# Patient Record
Sex: Male | Born: 1960 | Race: White | Hispanic: No | Marital: Married | Smoking: Current some day smoker
Health system: Southern US, Community
[De-identification: ages and names within clinical notes are randomized; demographics above are authoritative.]

## PROBLEM LIST (undated history)

## (undated) DIAGNOSIS — M5137 Other intervertebral disc degeneration, lumbosacral region: Secondary | ICD-10-CM

## (undated) DIAGNOSIS — G4730 Sleep apnea, unspecified: Secondary | ICD-10-CM

## (undated) DIAGNOSIS — K602 Anal fissure, unspecified: Secondary | ICD-10-CM

## (undated) DIAGNOSIS — I739 Peripheral vascular disease, unspecified: Secondary | ICD-10-CM

## (undated) DIAGNOSIS — F339 Major depressive disorder, recurrent, unspecified: Secondary | ICD-10-CM

## (undated) DIAGNOSIS — R0600 Dyspnea, unspecified: Secondary | ICD-10-CM

## (undated) DIAGNOSIS — M51379 Other intervertebral disc degeneration, lumbosacral region without mention of lumbar back pain or lower extremity pain: Secondary | ICD-10-CM

## (undated) DIAGNOSIS — K219 Gastro-esophageal reflux disease without esophagitis: Secondary | ICD-10-CM

## (undated) DIAGNOSIS — E1149 Type 2 diabetes mellitus with other diabetic neurological complication: Secondary | ICD-10-CM

## (undated) DIAGNOSIS — N186 End stage renal disease: Secondary | ICD-10-CM

## (undated) DIAGNOSIS — F341 Dysthymic disorder: Secondary | ICD-10-CM

## (undated) DIAGNOSIS — I2510 Atherosclerotic heart disease of native coronary artery without angina pectoris: Secondary | ICD-10-CM

## (undated) DIAGNOSIS — Z972 Presence of dental prosthetic device (complete) (partial): Secondary | ICD-10-CM

## (undated) DIAGNOSIS — Z860100 Personal history of colon polyps, unspecified: Secondary | ICD-10-CM

## (undated) DIAGNOSIS — I219 Acute myocardial infarction, unspecified: Secondary | ICD-10-CM

## (undated) DIAGNOSIS — I10 Essential (primary) hypertension: Secondary | ICD-10-CM

## (undated) DIAGNOSIS — J45909 Unspecified asthma, uncomplicated: Secondary | ICD-10-CM

## (undated) DIAGNOSIS — J449 Chronic obstructive pulmonary disease, unspecified: Secondary | ICD-10-CM

## (undated) DIAGNOSIS — F419 Anxiety disorder, unspecified: Secondary | ICD-10-CM

## (undated) DIAGNOSIS — K439 Ventral hernia without obstruction or gangrene: Secondary | ICD-10-CM

## (undated) DIAGNOSIS — I4891 Unspecified atrial fibrillation: Secondary | ICD-10-CM

## (undated) DIAGNOSIS — E785 Hyperlipidemia, unspecified: Secondary | ICD-10-CM

## (undated) DIAGNOSIS — Z95810 Presence of automatic (implantable) cardiac defibrillator: Secondary | ICD-10-CM

## (undated) DIAGNOSIS — E1165 Type 2 diabetes mellitus with hyperglycemia: Secondary | ICD-10-CM

## (undated) DIAGNOSIS — J189 Pneumonia, unspecified organism: Secondary | ICD-10-CM

## (undated) DIAGNOSIS — IMO0002 Reserved for concepts with insufficient information to code with codable children: Secondary | ICD-10-CM

## (undated) DIAGNOSIS — I509 Heart failure, unspecified: Secondary | ICD-10-CM

## (undated) DIAGNOSIS — Z8719 Personal history of other diseases of the digestive system: Secondary | ICD-10-CM

## (undated) DIAGNOSIS — Z992 Dependence on renal dialysis: Secondary | ICD-10-CM

## (undated) HISTORY — DX: Essential (primary) hypertension: I10

## (undated) HISTORY — PX: KNEE SURGERY: SHX244

## (undated) HISTORY — DX: Personal history of colon polyps, unspecified: Z86.0100

## (undated) HISTORY — PX: CORONARY ANGIOPLASTY: SHX604

## (undated) HISTORY — DX: Heart failure, unspecified: I50.9

## (undated) HISTORY — DX: Type 2 diabetes mellitus with other diabetic neurological complication: E11.49

## (undated) HISTORY — DX: Type 2 diabetes mellitus with other diabetic neurological complication: E11.65

## (undated) HISTORY — DX: Other intervertebral disc degeneration, lumbosacral region: M51.37

## (undated) HISTORY — PX: HERNIA REPAIR: SHX51

## (undated) HISTORY — DX: End stage renal disease: Z99.2

## (undated) HISTORY — DX: Dysthymic disorder: F34.1

## (undated) HISTORY — DX: Anal fissure, unspecified: K60.2

## (undated) HISTORY — PX: OTHER SURGICAL HISTORY: SHX169

## (undated) HISTORY — DX: Other intervertebral disc degeneration, lumbosacral region without mention of lumbar back pain or lower extremity pain: M51.379

## (undated) HISTORY — PX: CATARACT EXTRACTION W/ INTRAOCULAR LENS IMPLANT: SHX1309

## (undated) HISTORY — DX: Anxiety disorder, unspecified: F41.9

## (undated) HISTORY — DX: End stage renal disease: N18.6

## (undated) HISTORY — DX: Unspecified asthma, uncomplicated: J45.909

## (undated) HISTORY — PX: CORONARY STENT PLACEMENT: SHX1402

## (undated) HISTORY — DX: Hyperlipidemia, unspecified: E78.5

## (undated) HISTORY — DX: Major depressive disorder, recurrent, unspecified: F33.9

## (undated) HISTORY — DX: Atherosclerotic heart disease of native coronary artery without angina pectoris: I25.10

## (undated) HISTORY — DX: Sleep apnea, unspecified: G47.30

## (undated) HISTORY — DX: Reserved for concepts with insufficient information to code with codable children: IMO0002

## (undated) HISTORY — DX: Ventral hernia without obstruction or gangrene: K43.9

## (undated) HISTORY — DX: Chronic obstructive pulmonary disease, unspecified: J44.9

## (undated) HISTORY — DX: Personal history of colonic polyps: Z86.010

---

## 2003-02-06 ENCOUNTER — Encounter: Admission: RE | Admit: 2003-02-06 | Discharge: 2003-02-06 | Payer: Self-pay | Admitting: Unknown Physician Specialty

## 2010-03-28 HISTORY — PX: CARDIAC DEFIBRILLATOR PLACEMENT: SHX171

## 2010-06-09 DIAGNOSIS — I2510 Atherosclerotic heart disease of native coronary artery without angina pectoris: Secondary | ICD-10-CM

## 2013-05-01 DIAGNOSIS — F410 Panic disorder [episodic paroxysmal anxiety] without agoraphobia: Secondary | ICD-10-CM | POA: Insufficient documentation

## 2013-05-01 DIAGNOSIS — F330 Major depressive disorder, recurrent, mild: Secondary | ICD-10-CM | POA: Insufficient documentation

## 2013-12-27 HISTORY — PX: COLONOSCOPY: SHX174

## 2014-08-01 DIAGNOSIS — I70219 Atherosclerosis of native arteries of extremities with intermittent claudication, unspecified extremity: Secondary | ICD-10-CM | POA: Insufficient documentation

## 2015-03-06 DIAGNOSIS — E1142 Type 2 diabetes mellitus with diabetic polyneuropathy: Secondary | ICD-10-CM | POA: Insufficient documentation

## 2015-03-06 DIAGNOSIS — Z794 Long term (current) use of insulin: Secondary | ICD-10-CM

## 2015-03-06 DIAGNOSIS — E1165 Type 2 diabetes mellitus with hyperglycemia: Secondary | ICD-10-CM

## 2015-03-06 DIAGNOSIS — E11319 Type 2 diabetes mellitus with unspecified diabetic retinopathy without macular edema: Secondary | ICD-10-CM | POA: Insufficient documentation

## 2015-03-06 DIAGNOSIS — I10 Essential (primary) hypertension: Secondary | ICD-10-CM | POA: Insufficient documentation

## 2015-03-06 DIAGNOSIS — IMO0002 Reserved for concepts with insufficient information to code with codable children: Secondary | ICD-10-CM | POA: Insufficient documentation

## 2015-04-07 DIAGNOSIS — I2510 Atherosclerotic heart disease of native coronary artery without angina pectoris: Secondary | ICD-10-CM | POA: Insufficient documentation

## 2015-04-07 DIAGNOSIS — Z95810 Presence of automatic (implantable) cardiac defibrillator: Secondary | ICD-10-CM | POA: Insufficient documentation

## 2015-04-07 DIAGNOSIS — G4733 Obstructive sleep apnea (adult) (pediatric): Secondary | ICD-10-CM | POA: Insufficient documentation

## 2015-06-16 DIAGNOSIS — K469 Unspecified abdominal hernia without obstruction or gangrene: Secondary | ICD-10-CM | POA: Insufficient documentation

## 2015-08-26 DIAGNOSIS — J449 Chronic obstructive pulmonary disease, unspecified: Secondary | ICD-10-CM | POA: Insufficient documentation

## 2015-08-26 DIAGNOSIS — E039 Hypothyroidism, unspecified: Secondary | ICD-10-CM | POA: Insufficient documentation

## 2015-08-26 DIAGNOSIS — I739 Peripheral vascular disease, unspecified: Secondary | ICD-10-CM | POA: Insufficient documentation

## 2015-08-26 DIAGNOSIS — R809 Proteinuria, unspecified: Secondary | ICD-10-CM | POA: Insufficient documentation

## 2015-08-26 DIAGNOSIS — K219 Gastro-esophageal reflux disease without esophagitis: Secondary | ICD-10-CM | POA: Insufficient documentation

## 2015-08-26 DIAGNOSIS — M5137 Other intervertebral disc degeneration, lumbosacral region: Secondary | ICD-10-CM | POA: Insufficient documentation

## 2015-08-26 DIAGNOSIS — E782 Mixed hyperlipidemia: Secondary | ICD-10-CM | POA: Insufficient documentation

## 2015-08-26 DIAGNOSIS — M159 Polyosteoarthritis, unspecified: Secondary | ICD-10-CM | POA: Insufficient documentation

## 2015-08-26 DIAGNOSIS — F419 Anxiety disorder, unspecified: Secondary | ICD-10-CM | POA: Insufficient documentation

## 2015-08-27 DIAGNOSIS — F528 Other sexual dysfunction not due to a substance or known physiological condition: Secondary | ICD-10-CM | POA: Insufficient documentation

## 2015-10-26 DIAGNOSIS — K439 Ventral hernia without obstruction or gangrene: Secondary | ICD-10-CM | POA: Insufficient documentation

## 2015-11-25 DIAGNOSIS — I5022 Chronic systolic (congestive) heart failure: Secondary | ICD-10-CM | POA: Insufficient documentation

## 2015-11-25 DIAGNOSIS — Z720 Tobacco use: Secondary | ICD-10-CM | POA: Insufficient documentation

## 2015-12-16 DIAGNOSIS — N185 Chronic kidney disease, stage 5: Secondary | ICD-10-CM | POA: Insufficient documentation

## 2015-12-16 DIAGNOSIS — N2581 Secondary hyperparathyroidism of renal origin: Secondary | ICD-10-CM | POA: Insufficient documentation

## 2016-06-20 DIAGNOSIS — G894 Chronic pain syndrome: Secondary | ICD-10-CM | POA: Insufficient documentation

## 2016-07-07 DIAGNOSIS — R5381 Other malaise: Secondary | ICD-10-CM | POA: Insufficient documentation

## 2016-07-07 DIAGNOSIS — D649 Anemia, unspecified: Secondary | ICD-10-CM | POA: Insufficient documentation

## 2016-07-07 DIAGNOSIS — R5383 Other fatigue: Secondary | ICD-10-CM

## 2016-12-01 HISTORY — PX: ESOPHAGOGASTRODUODENOSCOPY: SHX1529

## 2017-03-16 DIAGNOSIS — I10 Essential (primary) hypertension: Secondary | ICD-10-CM | POA: Insufficient documentation

## 2017-03-16 DIAGNOSIS — N186 End stage renal disease: Secondary | ICD-10-CM | POA: Insufficient documentation

## 2017-03-16 DIAGNOSIS — E7800 Pure hypercholesterolemia, unspecified: Secondary | ICD-10-CM | POA: Insufficient documentation

## 2017-03-16 DIAGNOSIS — Z992 Dependence on renal dialysis: Secondary | ICD-10-CM

## 2017-03-16 DIAGNOSIS — I519 Heart disease, unspecified: Secondary | ICD-10-CM | POA: Insufficient documentation

## 2017-03-16 DIAGNOSIS — I7090 Unspecified atherosclerosis: Secondary | ICD-10-CM | POA: Insufficient documentation

## 2017-07-27 DIAGNOSIS — Z9181 History of falling: Secondary | ICD-10-CM | POA: Insufficient documentation

## 2017-08-16 DIAGNOSIS — T8571XA Infection and inflammatory reaction due to peritoneal dialysis catheter, initial encounter: Secondary | ICD-10-CM | POA: Insufficient documentation

## 2017-09-08 DIAGNOSIS — A0472 Enterocolitis due to Clostridium difficile, not specified as recurrent: Secondary | ICD-10-CM | POA: Insufficient documentation

## 2017-09-08 DIAGNOSIS — J9601 Acute respiratory failure with hypoxia: Secondary | ICD-10-CM | POA: Insufficient documentation

## 2017-10-16 DIAGNOSIS — I483 Typical atrial flutter: Secondary | ICD-10-CM | POA: Insufficient documentation

## 2017-10-18 ENCOUNTER — Encounter: Payer: Self-pay | Admitting: Gastroenterology

## 2017-10-30 ENCOUNTER — Encounter: Payer: Self-pay | Admitting: Gastroenterology

## 2017-11-09 ENCOUNTER — Encounter: Payer: Self-pay | Admitting: Gastroenterology

## 2017-11-09 ENCOUNTER — Ambulatory Visit (INDEPENDENT_AMBULATORY_CARE_PROVIDER_SITE_OTHER): Payer: Medicare Other | Admitting: Gastroenterology

## 2017-11-09 ENCOUNTER — Encounter (INDEPENDENT_AMBULATORY_CARE_PROVIDER_SITE_OTHER): Payer: Self-pay

## 2017-11-09 VITALS — BP 122/82 | HR 70 | Ht 72.0 in | Wt 179.0 lb

## 2017-11-09 DIAGNOSIS — A0471 Enterocolitis due to Clostridium difficile, recurrent: Secondary | ICD-10-CM | POA: Diagnosis not present

## 2017-11-09 NOTE — Patient Instructions (Signed)
If you are age 56 or older, your body mass index should be between 23-30. Your Body mass index is 24.28 kg/m. If this is out of the aforementioned range listed, please consider follow up with your Primary Care Provider.  If you are age 69 or younger, your body mass index should be between 19-25. Your Body mass index is 24.28 kg/m. If this is out of the aformentioned range listed, please consider follow up with your Primary Care Provider.     Thank you,  Dr. Jackquline Denmark

## 2017-11-09 NOTE — Progress Notes (Signed)
IMPRESSION and PLAN:    #1. H/O Recurrent C. Diff (2 episodes) -Last stool study was negative for C. difficile -Patient currently on tapering vancomycin 125 mg 4 times daily for 2 weeks, 125 mg twice daily for 1 week, 125 mg p.o. once a day for 1 week, 125 mg every 2 days for 4 weeks -Probiotics to continue. -Last colonoscopy 12/2013 (small tubular adenomas, next colonoscopy due 12/2018). -Since patient is doing very well currently and the diarrhea has completely resolved, would hold off on evaluation for fecal transplantation.  If he has recurrent diarrhea after stopping vancomycin, we would recheck stool, if positive give Dificid and reevaluate.  If fecal transplantation is needed, they would like to be referred to [facility]. -Can try to cut down Nexium to every other day.  If there is no recurrence of reflux, can consider decreasing Nexium further. -Discussed extensively with the patient and patient's wife. -Follow-up in 3 months, earlier in case of any problems. -Avoid antibiotics as much as possible.     #2.  Multiple comorbid conditions including CHF, ESRD on HD (MWF), CAD, CHF, T2DM, CVA, peripheral neuropathy.      HPI:    Chief Complaint:   Jose Sellers is a 57 y.o. male  With 2 episodes of C. difficile diarrhea After he was given antibiotics for Pseudomonas peritonitis while on peritoneal dialysis. Patient is currently on hemodialysis. His first episode was treated with 10-day course of vancomycin.  He did get better and then again started having diarrhea when he stopped vancomycin.  Thereafter he was given pulsed vancomycin therapy which she is currently taking. Doing very well currently No diarrhea No abdominal pain Denies having any fever chills. Feels significantly better No nausea vomiting heartburn regurgitation odynophagia or dysphagia.  He has been taking Nexium once a day. Has lost weight after he was diuresed.   Past Medical  History:  Diagnosis Date  . Anal fissure   . Anxiety   . Asthma   . CAD (coronary artery disease)   . CHF (congestive heart failure) ([place])   . COPD (chronic obstructive pulmonary disease) ([place])   . Degeneration of lumbar or lumbosacral intervertebral disc   . DM (diabetes mellitus), type 2, uncontrolled w/neurologic complication ([place])   . Dysthymic disorder   . End stage renal disease on dialysis ([place])   . Essential (primary) hypertension   . Hx of colonic polyps   . Hyperlipemia   . Recurrent major depression ([place])   . Sleep apnea   . Ventral hernia without obstruction or gangrene    . Current Outpatient Medications on File Prior to Visit  Medication Sig Dispense Refill  . amiodarone (PACERONE) 200 MG tablet Take 200 mg by mouth daily.    Marland Kitchen amLODipine (NORVASC) 5 MG tablet Take 5 mg by mouth daily.    Marland Kitchen atorvastatin (LIPITOR) 40 MG tablet Take 40 mg by mouth daily.    . calcitRIOL (ROCALTROL) 0.25 MCG capsule Take 0.25 mcg by mouth daily.    . carvedilol (COREG) 6.25 MG tablet Take 6.25 mg by mouth 2 (two) times daily with a meal.    . esomeprazole (NEXIUM) 40 MG capsule Take 40 mg by mouth daily at 12 noon.    Marland Kitchen FERRIC CITRATE PO Take by mouth.    Marland Kitchen FLUoxetine (PROZAC) 20 MG tablet Take 20 mg by mouth daily.    . furosemide (LASIX) 40 MG tablet Take 40 mg by mouth.    Marland Kitchen  hydrALAZINE (APRESOLINE) 25 MG tablet Take 25 mg by mouth 2 (two) times daily.    . insulin lispro (HUMALOG) 100 UNIT/ML injection Inject into the skin 3 (three) times daily before meals.    Marland Kitchen levalbuterol (XOPENEX HFA) 45 MCG/ACT inhaler Inhale 1 puff into the lungs every 4 (four) hours as needed for wheezing.    Marland Kitchen lisinopril (PRINIVIL,ZESTRIL) 10 MG tablet Take 10 mg by mouth daily.    . nitroGLYCERIN (NITROSTAT) 0.4 MG SL tablet Place 0.4 mg under the tongue every 5 (five) minutes as needed for chest pain.    Marland Kitchen oxycodone (ROXICODONE) 30 MG immediate release tablet Take 30 mg by mouth every 4 (four) hours as  needed for pain.    Marland Kitchen oxyCODONE-acetaminophen (PERCOCET) 7.5-325 MG tablet Take 1 tablet by mouth every 4 (four) hours as needed for severe pain.    . predniSONE (DELTASONE) 20 MG tablet Take 20 mg by mouth daily with breakfast.    . vancomycin (VANCOCIN) 125 MG capsule Take 125 mg by mouth as directed.     No current facility-administered medications on file prior to visit.     No current outpatient medications on file.   No current facility-administered medications for this visit.     Family History  Problem Relation Age of Onset  . Colon cancer Neg Hx     Social History   Tobacco Use  . Smoking status: Current Every Day Smoker  Substance Use Topics  . Alcohol use: Not Currently  . Drug use: Never    Allergies not on file   Review of Systems: All systems reviewed and negative except where noted in HPI.    Physical Exam:     BP 122/82   Pulse 70   Ht 6' (1.829 m)   Wt 179 lb (81.2 kg)   SpO2 96%   BMI 24.28 kg/m   GENERAL:  Alert, oriented, cooperative, not in acute distress. PSYCH: :Pleasant, normal mood and affect. HEENT:  conjunctiva pink, mucous membranes moist, neck supple without masses. No jaundice. CARDIAC:  S1 S2 normal. No murmers. PULM: Normal respiratory effort, lungs CTA bilaterally, no wheezing. ABDOMEN: Inspection: No visible peristalsis, no abnormal pulsations, skin normal.  Palpation/percussion: Soft, nontender, nondistended, no rigidity, no abnormal dullness to percussion, no hepatosplenomegaly and no palpable abdominal masses.  Auscultation: Normal bowel sounds, no abdominal bruits. Rectal exam: Deferred SKIN:  turgor, no lesions seen. Musculoskeletal:  Normal muscle tone, normal strength. NEURO: Alert and oriented x 3, no focal neurologic deficits.      Jaia Alonge,MD 11/09/2017, 11:34 AM   CC Dr Bea Graff.

## 2017-12-18 ENCOUNTER — Other Ambulatory Visit: Payer: Self-pay

## 2017-12-18 ENCOUNTER — Ambulatory Visit: Payer: Medicare Other | Admitting: Podiatry

## 2017-12-21 ENCOUNTER — Other Ambulatory Visit: Payer: Self-pay

## 2017-12-21 ENCOUNTER — Encounter: Payer: Self-pay | Admitting: Sports Medicine

## 2017-12-21 ENCOUNTER — Ambulatory Visit: Payer: Medicare Other | Admitting: Sports Medicine

## 2017-12-21 ENCOUNTER — Ambulatory Visit (INDEPENDENT_AMBULATORY_CARE_PROVIDER_SITE_OTHER): Payer: Medicare Other | Admitting: Sports Medicine

## 2017-12-21 DIAGNOSIS — N186 End stage renal disease: Secondary | ICD-10-CM

## 2017-12-21 DIAGNOSIS — I519 Heart disease, unspecified: Secondary | ICD-10-CM

## 2017-12-21 DIAGNOSIS — B351 Tinea unguium: Secondary | ICD-10-CM

## 2017-12-21 DIAGNOSIS — E1142 Type 2 diabetes mellitus with diabetic polyneuropathy: Secondary | ICD-10-CM

## 2017-12-21 DIAGNOSIS — M79674 Pain in right toe(s): Secondary | ICD-10-CM

## 2017-12-21 DIAGNOSIS — M79675 Pain in left toe(s): Secondary | ICD-10-CM | POA: Diagnosis not present

## 2017-12-21 MED ORDER — GABAPENTIN 300 MG PO CAPS: 300.0000 mg | ORAL_CAPSULE | Freq: Three times a day (TID) | ORAL | 3 refills | Status: DC

## 2017-12-21 NOTE — Progress Notes (Signed)
Subjective: Jose Sellers is a 57 y.o. male patient with history of diabetes who presents to office today complaining of long,mildly painful nails  while ambulating in shoes; unable to trim. Patient states that the glucose reading this morning was 180 mg/dl. Does not know A1c and last PCP was 1 month ago. Patient denies any new changes in medication or new problems. Patient admits numbness, burning or tingling in the legs/feet for years 10/10 pain.  Review of Systems  Musculoskeletal: Positive for myalgias.  All other systems reviewed and are negative.    Patient Active Problem List   Diagnosis Date Noted  . Typical atrial flutter ([place]) 10/16/2017  . Acute respiratory failure with hypoxia ([place]) 09/08/2017  . C. difficile diarrhea 09/08/2017  . Peritonitis associated with peritoneal dialysis ([place]) 08/16/2017  . At high risk for falls 07/27/2017  . Arteriosclerotic vascular disease 03/16/2017  . Chronic heart disease 03/16/2017  . End-stage renal disease ([place]) 03/16/2017  . Hypercholesterolemia 03/16/2017  . Hypertensive disorder 03/16/2017  . Anemia of unknown etiology 07/07/2016  . Malaise and fatigue 07/07/2016  . Chronic pain syndrome 06/20/2016  . Secondary hyperparathyroidism ([place]) 12/16/2015  . Stage 5 chronic kidney disease ([place]) 12/16/2015  . Chronic systolic congestive heart failure ([place]) 11/25/2015  . Tobacco use 11/25/2015  . Ventral hernia without obstruction or gangrene 10/26/2015  . Psychosexual dysfunction with inhibited sexual excitement 08/27/2015  . Anxiety 08/26/2015  . COPD (chronic obstructive pulmonary disease) ([place]) 08/26/2015  . DDD (degenerative disc disease), lumbosacral 08/26/2015  . GERD (gastroesophageal reflux disease) 08/26/2015  . Hypothyroidism 08/26/2015  . Microalbuminuria 08/26/2015  . Mixed hyperlipidemia 08/26/2015  . Osteoarthritis of multiple joints 08/26/2015  . Peripheral vascular disease ([place]) 08/26/2015  . Abdominal hernia 06/16/2015   . CAD (coronary artery disease) 04/07/2015  . 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 OSA (obstructive sleep apnea) 04/07/2015  . Essential hypertension 03/06/2015  . Retinopathy, diabetic, bilateral ([place]) 03/06/2015  . Uncontrolled type 2 diabetes mellitus with diabetic polyneuropathy, with long-term current use of insulin ([place]) 03/06/2015  . Atherosclerosis with claudication of extremity ([place]) 08/01/2014  . Mild episode of recurrent major depressive disorder ([place]) 05/01/2013  . Panic disorder 05/01/2013   Current Outpatient Medications on File Prior to Visit  Medication Sig Dispense Refill  . albuterol (PROAIR HFA) 108 (90 Base) MCG/ACT inhaler ProAir HFA 90 mcg/actuation aerosol inhaler    . amiodarone (PACERONE) 200 MG tablet Take 200 mg by mouth daily.    Marland Kitchen amLODipine (NORVASC) 5 MG tablet Take 5 mg by mouth daily.    Marland Kitchen apixaban (ELIQUIS) 2.5 MG TABS tablet Take by mouth.    Marland Kitchen aspirin 81 MG tablet Take by mouth.    Marland Kitchen atorvastatin (LIPITOR) 40 MG tablet Take 40 mg by mouth daily.    . calcitRIOL (ROCALTROL) 0.25 MCG capsule Take 0.25 mcg by mouth daily.    . carvedilol (COREG) 6.25 MG tablet Take 6.25 mg by mouth 2 (two) times daily with a meal.    . cephALEXin (KEFLEX) 500 MG capsule cephalexin 500 mg capsule    . cloNIDine (CATAPRES) 0.1 MG tablet clonidine HCl 0.1 mg tablet    . doxycycline (VIBRA-TABS) 100 MG tablet doxycycline hyclate 100 mg tablet    . ELIQUIS 2.5 MG TABS tablet TAKE ONE TABLET BY MOUTH 2 TIMES DAILY.  3  . esomeprazole (NEXIUM) 40 MG capsule Take 40 mg by mouth daily at 12 noon.    Marland Kitchen esomeprazole (NEXIUM) 40 MG capsule esomeprazole magnesium  40 mg capsule,delayed release    . fenofibrate (TRICOR) 145 MG tablet fenofibrate nanocrystallized 145 mg tablet    . FERRIC CITRATE PO Take by mouth.    . FIRVANQ 50 MG/ML SOLR   0  . FLOVENT HFA 44 MCG/ACT inhaler INL 2 PFS PO BID  3  . FLUoxetine (PROZAC) 20 MG capsule fluoxetine 20 mg  capsule    . FLUoxetine (PROZAC) 20 MG tablet Take 20 mg by mouth daily.    . fluticasone furoate-vilanterol (BREO ELLIPTA) 100-25 MCG/INH AEPB Breo Ellipta 100 mcg-25 mcg/dose powder for inhalation    . Fluticasone Propionate, Inhal, (FLOVENT DISKUS) 100 MCG/BLIST AEPB Flovent Diskus 100 mcg/actuation powder for inhalation    . furosemide (LASIX) 40 MG tablet Take 40 mg by mouth.    . hydrALAZINE (APRESOLINE) 25 MG tablet Take 25 mg by mouth 2 (two) times daily.    . Insulin Glargine (LANTUS SOLOSTAR) 100 UNIT/ML Solostar Pen Lantus Solostar U-100 Insulin 100 unit/mL (3 mL) subcutaneous pen    . insulin glargine (LANTUS) 100 UNIT/ML injection Inject into the skin.    Marland Kitchen insulin lispro (HUMALOG) 100 UNIT/ML injection Inject into the skin 3 (three) times daily before meals.    Marland Kitchen levalbuterol (XOPENEX HFA) 45 MCG/ACT inhaler Inhale 1 puff into the lungs every 4 (four) hours as needed for wheezing.    . levalbuterol (XOPENEX) 0.63 MG/3ML nebulizer solution TK 1 INHALATION IN NEBULIZER QID  1  . levalbuterol (XOPENEX) 0.63 MG/3ML nebulizer solution Inhale 1 vial every 4 hours    . lisinopril (PRINIVIL,ZESTRIL) 10 MG tablet Take 10 mg by mouth daily.    . metoprolol tartrate (LOPRESSOR) 50 MG tablet metoprolol tartrate 50 mg tablet    . Neomycin-Bacitracin Zn-Polymyx 3.5-400-10000 OINT neomycin 3.5 mg/g-polymyxin B 10,000 unit/g-dexameth 0.1 % eye oint    . nitroGLYCERIN (NITROSTAT) 0.4 MG SL tablet Place 0.4 mg under the tongue every 5 (five) minutes as needed for chest pain.    Marland Kitchen ondansetron (ZOFRAN-ODT) 4 MG disintegrating tablet ondansetron 4 mg disintegrating tablet    . oxycodone (ROXICODONE) 30 MG immediate release tablet Take 30 mg by mouth every 4 (four) hours as needed for pain.    Marland Kitchen oxyCODONE-acetaminophen (PERCOCET) 7.5-325 MG tablet Take 1 tablet by mouth every 4 (four) hours as needed for severe pain.    . predniSONE (DELTASONE) 20 MG tablet Take 20 mg by mouth daily with breakfast.    .  vancomycin (VANCOCIN) 125 MG capsule Take 125 mg by mouth as directed.     No current facility-administered medications on file prior to visit.    Allergies  Allergen Reactions  . Fluticasone Furoate-Vilanterol Palpitations  . Morphine Rash    No results found for this or any previous visit (from the past 2160 hour(s)).  Objective: General: Patient is awake, alert, and oriented x 3 and in no acute distress.  Integument: Skin is warm, dry and supple bilateral. Nails are tender, long, thickened and  dystrophic with subungual debris, consistent with onychomycosis, 1-5 bilateral. No signs of infection. No open lesions or preulcerative lesions present bilateral. Remaining integument unremarkable.  Vasculature:  Dorsalis Pedis pulse 2/4 bilateral. Posterior Tibial pulse  1/4 bilateral.  Capillary fill time <5 sec 1-5 bilateral. Positive hair growth to the level of the digits. Temperature gradient within normal limits. No varicosities present bilateral. No edema present bilateral.   Neurology: The patient has diminished sensation measured with a 5.07/10g Semmes Weinstein Monofilament at all pedal sites bilateral . Vibratory sensation  diminished bilateral with tuning fork. No Babinski sign present bilateral.   Musculoskeletal: No symptomatic pedal deformities noted bilateral. Muscular strength 5/5 in all lower extremity muscular groups bilateral without pain on range of motion . No tenderness with calf compression bilateral.  Assessment and Plan: Problem List Items Addressed This Visit      Cardiovascular and Mediastinum   Chronic heart disease     Genitourinary   End-stage renal disease ([place])    Other Visit Diagnoses    Pain due to onychomycosis of toenails of both feet    -  Primary   Diabetic polyneuropathy associated with type 2 diabetes mellitus ([facility])       Relevant Medications   gabapentin (NEURONTIN) 300 MG capsule     -Examined patient. -Discussed and educated patient on  diabetic foot care, especially with  regards to the vascular, neurological and musculoskeletal systems.  -Stressed the importance of good glycemic control and the detriment of not  controlling glucose levels in relation to the foot. -Mechanically debrided all nails 1-5 bilateral using sterile nail nipper and filed with dremel without incident  -Rx Gabapentin 300 TID  -Patient to return  in 3 months for at risk foot care and med check -Patient advised to call the office if any problems or questions arise in the meantime.  Landis Martins, DPM

## 2017-12-21 NOTE — Patient Instructions (Signed)

## 2017-12-28 ENCOUNTER — Other Ambulatory Visit: Payer: Self-pay

## 2017-12-28 ENCOUNTER — Ambulatory Visit: Payer: Medicare Other | Admitting: Sports Medicine

## 2017-12-28 DIAGNOSIS — N186 End stage renal disease: Secondary | ICD-10-CM

## 2018-01-16 ENCOUNTER — Other Ambulatory Visit: Payer: Self-pay

## 2018-01-16 DIAGNOSIS — I739 Peripheral vascular disease, unspecified: Secondary | ICD-10-CM

## 2018-02-08 ENCOUNTER — Encounter: Payer: Self-pay | Admitting: *Deleted

## 2018-02-08 ENCOUNTER — Other Ambulatory Visit: Payer: Self-pay | Admitting: *Deleted

## 2018-02-08 ENCOUNTER — Other Ambulatory Visit: Payer: Self-pay

## 2018-02-08 ENCOUNTER — Encounter: Payer: Self-pay | Admitting: Vascular Surgery

## 2018-02-08 ENCOUNTER — Ambulatory Visit (HOSPITAL_COMMUNITY)
Admission: RE | Admit: 2018-02-08 | Discharge: 2018-02-08 | Disposition: A | Discharge status: Home / Self Care | Payer: Medicare Other | Source: Ambulatory Visit | Attending: Vascular Surgery | Admitting: Vascular Surgery

## 2018-02-08 ENCOUNTER — Ambulatory Visit (INDEPENDENT_AMBULATORY_CARE_PROVIDER_SITE_OTHER)
Admission: RE | Admit: 2018-02-08 | Discharge: 2018-02-08 | Disposition: A | Discharge status: Home / Self Care | Payer: Medicare Other | Source: Ambulatory Visit | Attending: Vascular Surgery | Admitting: Vascular Surgery

## 2018-02-08 ENCOUNTER — Ambulatory Visit (INDEPENDENT_AMBULATORY_CARE_PROVIDER_SITE_OTHER): Payer: Medicare Other | Admitting: Vascular Surgery

## 2018-02-08 VITALS — BP 150/81 | HR 75 | Temp 97.0°F | Resp 16 | Ht 71.5 in | Wt 184.5 lb

## 2018-02-08 DIAGNOSIS — N186 End stage renal disease: Secondary | ICD-10-CM | POA: Insufficient documentation

## 2018-02-08 DIAGNOSIS — Z992 Dependence on renal dialysis: Secondary | ICD-10-CM | POA: Diagnosis not present

## 2018-02-08 NOTE — H&P (View-Only) (Signed)
Referring Physician: Dr Justin Mend  Patient name: Jose Sellers MRN: [id number] DOB: [birth year] Sex: male  REASON FOR CONSULT: Hemodialysis access  HPI: Jose Sellers is a 57 y.o. male, referred by Dr. Justin Mend for placement of a long-term hemodialysis access.  The patient was on peritoneal dialysis for a couple of years and recently had his PD catheter removed for infection.  He has now been on hemodialysis since around March 2019.  He has been using a right side dialysis catheter.  Of note he has a left side defibrillator.  Currently dialyzes Monday Wednesday Friday in [place].  He is right-handed.  He is on Eliquis for atrial fibrillation.  He denies any numbness or tingling in his hands.  Other medical problems include congestive heart failure, diabetes, COPD (requiring nighttime home oxygen), coronary artery disease.  These are all currently stable.  Of note he has been having numbness tingling and aching in his legs and is scheduled to see me in 3 weeks regarding peripheral arterial disease.  Past Medical History:  Diagnosis Date  . Anal fissure   . Anxiety   . Asthma   . CAD (coronary artery disease)   . CHF (congestive heart failure) ([place])   . COPD (chronic obstructive pulmonary disease) ([place])   . Degeneration of lumbar or lumbosacral intervertebral disc   . DM (diabetes mellitus), type 2, uncontrolled w/neurologic complication ([place])   . Dysthymic disorder   . End stage renal disease on dialysis ([place])   . Essential (primary) hypertension   . Hx of colonic polyps   . Hyperlipemia   . Recurrent major depression ([place])   . Sleep apnea   . Ventral hernia without obstruction or gangrene    Past Surgical History:  Procedure Laterality Date  . CAD s/p cabbage    . COLONOSCOPY  12/27/2013   Colonic polyps status post polypectomy.  Mild sigmoid diverticulosis. Internal hemorrhoids. Tubular adenoma.   . CORONARY STENT PLACEMENT    . ESOPHAGOGASTRODUODENOSCOPY  12/01/2016   Mild  gastritis.   . heart bypass    . HERNIA REPAIR    . KNEE SURGERY      Family History  Problem Relation Age of Onset  . Colon cancer Neg Hx     SOCIAL HISTORY: Social History   Socioeconomic History  . Marital status: Married    Spouse name: Not on file  . Number of children: Not on file  . Years of education: Not on file  . Highest education level: Not on file  Occupational History  . Not on file  Social Needs  . Financial resource strain: Not on file  . Food insecurity:    Worry: Not on file    Inability: Not on file  . Transportation needs:    Medical: Not on file    Non-medical: Not on file  Tobacco Use  . Smoking status: Current Every Day Smoker  . Smokeless tobacco: Never Used  Substance and Sexual Activity  . Alcohol use: Not Currently  . Drug use: Never  . Sexual activity: Not on file  Lifestyle  . Physical activity:    Days per week: Not on file    Minutes per session: Not on file  . Stress: Not on file  Relationships  . Social connections:    Talks on phone: Not on file    Gets together: Not on file    Attends religious service: Not on file    Active member of club or organization:  Not on file    Attends meetings of clubs or organizations: Not on file    Relationship status: Not on file  . Intimate partner violence:    Fear of current or ex partner: Not on file    Emotionally abused: Not on file    Physically abused: Not on file    Forced sexual activity: Not on file  Other Topics Concern  . Not on file  Social History Narrative  . Not on file    Allergies  Allergen Reactions  . Fluticasone Furoate-Vilanterol Palpitations  . Morphine Rash    Current Outpatient Medications  Medication Sig Dispense Refill  . albuterol (PROAIR HFA) 108 (90 Base) MCG/ACT inhaler ProAir HFA 90 mcg/actuation aerosol inhaler    . amiodarone (PACERONE) 200 MG tablet Take 200 mg by mouth daily.    Marland Kitchen amiodarone (PACERONE) 200 MG tablet Take by mouth.    Marland Kitchen  amLODipine (NORVASC) 5 MG tablet Take 5 mg by mouth daily.    Marland Kitchen apixaban (ELIQUIS) 2.5 MG TABS tablet Take by mouth.    Marland Kitchen aspirin 81 MG tablet Take by mouth.    Marland Kitchen atorvastatin (LIPITOR) 40 MG tablet Take 40 mg by mouth daily.    . calcitRIOL (ROCALTROL) 0.25 MCG capsule Take 0.25 mcg by mouth daily.    . carvedilol (COREG) 6.25 MG tablet Take 6.25 mg by mouth 2 (two) times daily with a meal.    . cephALEXin (KEFLEX) 500 MG capsule cephalexin 500 mg capsule    . cloNIDine (CATAPRES) 0.1 MG tablet clonidine HCl 0.1 mg tablet    . doxycycline (VIBRA-TABS) 100 MG tablet doxycycline hyclate 100 mg tablet    . ELIQUIS 2.5 MG TABS tablet TAKE ONE TABLET BY MOUTH 2 TIMES DAILY.  3  . esomeprazole (NEXIUM) 40 MG capsule Take 40 mg by mouth daily at 12 noon.    Marland Kitchen esomeprazole (NEXIUM) 40 MG capsule esomeprazole magnesium 40 mg capsule,delayed release    . fenofibrate (TRICOR) 145 MG tablet fenofibrate nanocrystallized 145 mg tablet    . FERRIC CITRATE PO Take by mouth.    . FIRVANQ 50 MG/ML SOLR   0  . FLOVENT HFA 44 MCG/ACT inhaler INL 2 PFS PO BID  3  . FLUoxetine (PROZAC) 20 MG capsule fluoxetine 20 mg capsule    . FLUoxetine (PROZAC) 20 MG tablet Take 20 mg by mouth daily.    . fluticasone furoate-vilanterol (BREO ELLIPTA) 100-25 MCG/INH AEPB Breo Ellipta 100 mcg-25 mcg/dose powder for inhalation    . Fluticasone Propionate, Inhal, (FLOVENT DISKUS) 100 MCG/BLIST AEPB Flovent Diskus 100 mcg/actuation powder for inhalation    . furosemide (LASIX) 40 MG tablet Take 40 mg by mouth.    . gabapentin (NEURONTIN) 300 MG capsule Take 1 capsule (300 mg total) by mouth 3 (three) times daily. 90 capsule 3  . hydrALAZINE (APRESOLINE) 25 MG tablet Take 25 mg by mouth 2 (two) times daily.    . Insulin Glargine (LANTUS SOLOSTAR) 100 UNIT/ML Solostar Pen Lantus Solostar U-100 Insulin 100 unit/mL (3 mL) subcutaneous pen    . insulin glargine (LANTUS) 100 UNIT/ML injection Inject into the skin.    Marland Kitchen insulin lispro  (HUMALOG) 100 UNIT/ML injection Inject into the skin 3 (three) times daily before meals.    Marland Kitchen levalbuterol (XOPENEX HFA) 45 MCG/ACT inhaler Inhale 1 puff into the lungs every 4 (four) hours as needed for wheezing.    . levalbuterol (XOPENEX) 0.63 MG/3ML nebulizer solution TK 1 INHALATION IN NEBULIZER QID  1  . levalbuterol (XOPENEX) 0.63 MG/3ML nebulizer solution Inhale 1 vial every 4 hours    . lisinopril (PRINIVIL,ZESTRIL) 10 MG tablet Take 10 mg by mouth daily.    . metoprolol tartrate (LOPRESSOR) 50 MG tablet metoprolol tartrate 50 mg tablet    . Neomycin-Bacitracin Zn-Polymyx 3.5-400-10000 OINT neomycin 3.5 mg/g-polymyxin B 10,000 unit/g-dexameth 0.1 % eye oint    . nitroGLYCERIN (NITROSTAT) 0.4 MG SL tablet Place 0.4 mg under the tongue every 5 (five) minutes as needed for chest pain.    Marland Kitchen ondansetron (ZOFRAN-ODT) 4 MG disintegrating tablet ondansetron 4 mg disintegrating tablet    . oxycodone (ROXICODONE) 30 MG immediate release tablet Take 30 mg by mouth every 4 (four) hours as needed for pain.    Marland Kitchen oxyCODONE-acetaminophen (PERCOCET) 7.5-325 MG tablet Take 1 tablet by mouth every 4 (four) hours as needed for severe pain.    . predniSONE (DELTASONE) 20 MG tablet Take 20 mg by mouth daily with breakfast.    . vancomycin (VANCOCIN) 125 MG capsule Take 125 mg by mouth as directed.     No current facility-administered medications for this visit.     ROS:   General:  No weight loss, Fever, chills  HEENT: No recent headaches, no nasal bleeding, no visual changes, no sore throat  Neurologic: No dizziness, blackouts, seizures. No recent symptoms of stroke or mini- stroke. No recent episodes of slurred speech, or temporary blindness.  Cardiac: No recent episodes of chest pain/pressure, no shortness of breath at rest.  + shortness of breath with exertion.  Denies history of atrial fibrillation or irregular heartbeat  Vascular: No history of rest pain in feet.  No history of claudication.  No  history of non-healing ulcer, No history of DVT   Pulmonary: + home oxygen, + productive cough, no hemoptysis,  No asthma or wheezing  Musculoskeletal:  [ ]  Arthritis, [ ]  Low back pain,  [ ]  Joint pain  Hematologic:No history of hypercoagulable state.  No history of easy bleeding.  No history of anemia  Gastrointestinal: No hematochezia or melena,  No gastroesophageal reflux, no trouble swallowing  Urinary: [X]  chronic Kidney disease, [X]  on HD - [X]  MWF or [ ]  TTHS, [ ]  Burning with urination, [ ]  Frequent urination, [ ]  Difficulty urinating;   Skin: No rashes  Psychological: No history of anxiety,  No history of depression   Physical Examination  Vitals:   02/08/18 0908  BP: (!) 150/81  Pulse: 75  Resp: 16  Temp: (!) 97 F (36.1 C)  TempSrc: Oral  SpO2: 100%  Weight: 184 lb 8 oz (83.7 kg)  Height: 5' 11.5" (1.816 m)    Body mass index is 25.37 kg/m.  General:  Alert and oriented, no acute distress HEENT: Normal Neck: No bruit or JVD Pulmonary: Clear to auscultation bilaterally Cardiac: Regular Rate and Rhythm without murmur Abdomen: Soft, non-tender, non-distended Skin: No rash Extremity Pulses:  2+ radial, brachial pulses bilaterally Musculoskeletal: No deformity or edema  Neurologic: Upper and lower extremity motor 5/5 and symmetric  DATA:  Patient had an upper extremity arterial duplex today as well as vein mapping ultrasound.  I reviewed and interpreted the study.  This showed a good quality cephalic vein on the right side 2-1/2 to 3 mm at the wrist progressing to 3 to 4 mm in the upper arm.  Left cephalic vein was fairly small less than 3 mm throughout its entire course, left and right basilic veins were of good quality both greater  than 4 mm in diameter in the upper arm.  His arterial duplex scan showed essentially normal brachial artery configuration with ulnar artery occlusion bilaterally triphasic flow in the right brachial and radial.  Radial artery  diameter at the wrist on the right side was 0.28 cm.  Ocular artery was 6 mm diameter.  Diffuse plaque throughout the artery bilaterally.  ASSESSMENT: Patient needs long-term hemodialysis access.  Risk benefits possible complications of procedure details were discussed the patient today including not limited to bleeding infection ischemic steal need for other procedures.  He wishes to proceed.   PLAN: We will start with his right arm since he has a defibrillator on the left side.  Plan will be for right radiocephalic versus brachiocephalic fistula scheduled for Tuesday, February 13, 2018.  We will stop his Eliquis perioperatively.   Ruta Hinds, MD [facility] Office: [phone] Pager: [phone]

## 2018-02-08 NOTE — Progress Notes (Signed)
Medication hold clearance faxed to [facility].

## 2018-02-08 NOTE — Progress Notes (Signed)
Referring Physician: Dr Justin Mend  Patient name: Jose Sellers MRN: [id number] DOB: November 11, 1960 Sex: male  REASON FOR CONSULT: Hemodialysis access  HPI: Jose Sellers is a 57 y.o. male, referred by Dr. Justin Mend for placement of a long-term hemodialysis access.  The patient was on peritoneal dialysis for a couple of years and recently had his PD catheter removed for infection.  He has now been on hemodialysis since around March 2019.  He has been using a right side dialysis catheter.  Of note he has a left side defibrillator.  Currently dialyzes Monday Wednesday Friday in [place].  He is right-handed.  He is on Eliquis for atrial fibrillation.  He denies any numbness or tingling in his hands.  Other medical problems include congestive heart failure, diabetes, COPD (requiring nighttime home oxygen), coronary artery disease.  These are all currently stable.  Of note he has been having numbness tingling and aching in his legs and is scheduled to see me in 3 weeks regarding peripheral arterial disease.  Past Medical History:  Diagnosis Date  . Anal fissure   . Anxiety   . Asthma   . CAD (coronary artery disease)   . CHF (congestive heart failure) ([place])   . COPD (chronic obstructive pulmonary disease) ([place])   . Degeneration of lumbar or lumbosacral intervertebral disc   . DM (diabetes mellitus), type 2, uncontrolled w/neurologic complication ([place])   . Dysthymic disorder   . End stage renal disease on dialysis ([place])   . Essential (primary) hypertension   . Hx of colonic polyps   . Hyperlipemia   . Recurrent major depression ([place])   . Sleep apnea   . Ventral hernia without obstruction or gangrene    Past Surgical History:  Procedure Laterality Date  . CAD s/p cabbage    . COLONOSCOPY  12/27/2013   Colonic polyps status post polypectomy.  Mild sigmoid diverticulosis. Internal hemorrhoids. Tubular adenoma.   . CORONARY STENT PLACEMENT    . ESOPHAGOGASTRODUODENOSCOPY  12/01/2016   Mild  gastritis.   . heart bypass    . HERNIA REPAIR    . KNEE SURGERY      Family History  Problem Relation Age of Onset  . Colon cancer Neg Hx     SOCIAL HISTORY: Social History   Socioeconomic History  . Marital status: Married    Spouse name: Not on file  . Number of children: Not on file  . Years of education: Not on file  . Highest education level: Not on file  Occupational History  . Not on file  Social Needs  . Financial resource strain: Not on file  . Food insecurity:    Worry: Not on file    Inability: Not on file  . Transportation needs:    Medical: Not on file    Non-medical: Not on file  Tobacco Use  . Smoking status: Current Every Day Smoker  . Smokeless tobacco: Never Used  Substance and Sexual Activity  . Alcohol use: Not Currently  . Drug use: Never  . Sexual activity: Not on file  Lifestyle  . Physical activity:    Days per week: Not on file    Minutes per session: Not on file  . Stress: Not on file  Relationships  . Social connections:    Talks on phone: Not on file    Gets together: Not on file    Attends religious service: Not on file    Active member of club or organization:  Not on file    Attends meetings of clubs or organizations: Not on file    Relationship status: Not on file  . Intimate partner violence:    Fear of current or ex partner: Not on file    Emotionally abused: Not on file    Physically abused: Not on file    Forced sexual activity: Not on file  Other Topics Concern  . Not on file  Social History Narrative  . Not on file    Allergies  Allergen Reactions  . Fluticasone Furoate-Vilanterol Palpitations  . Morphine Rash    Current Outpatient Medications  Medication Sig Dispense Refill  . albuterol (PROAIR HFA) 108 (90 Base) MCG/ACT inhaler ProAir HFA 90 mcg/actuation aerosol inhaler    . amiodarone (PACERONE) 200 MG tablet Take 200 mg by mouth daily.    Marland Kitchen amiodarone (PACERONE) 200 MG tablet Take by mouth.    Marland Kitchen  amLODipine (NORVASC) 5 MG tablet Take 5 mg by mouth daily.    Marland Kitchen apixaban (ELIQUIS) 2.5 MG TABS tablet Take by mouth.    Marland Kitchen aspirin 81 MG tablet Take by mouth.    Marland Kitchen atorvastatin (LIPITOR) 40 MG tablet Take 40 mg by mouth daily.    . calcitRIOL (ROCALTROL) 0.25 MCG capsule Take 0.25 mcg by mouth daily.    . carvedilol (COREG) 6.25 MG tablet Take 6.25 mg by mouth 2 (two) times daily with a meal.    . cephALEXin (KEFLEX) 500 MG capsule cephalexin 500 mg capsule    . cloNIDine (CATAPRES) 0.1 MG tablet clonidine HCl 0.1 mg tablet    . doxycycline (VIBRA-TABS) 100 MG tablet doxycycline hyclate 100 mg tablet    . ELIQUIS 2.5 MG TABS tablet TAKE ONE TABLET BY MOUTH 2 TIMES DAILY.  3  . esomeprazole (NEXIUM) 40 MG capsule Take 40 mg by mouth daily at 12 noon.    Marland Kitchen esomeprazole (NEXIUM) 40 MG capsule esomeprazole magnesium 40 mg capsule,delayed release    . fenofibrate (TRICOR) 145 MG tablet fenofibrate nanocrystallized 145 mg tablet    . FERRIC CITRATE PO Take by mouth.    . FIRVANQ 50 MG/ML SOLR   0  . FLOVENT HFA 44 MCG/ACT inhaler INL 2 PFS PO BID  3  . FLUoxetine (PROZAC) 20 MG capsule fluoxetine 20 mg capsule    . FLUoxetine (PROZAC) 20 MG tablet Take 20 mg by mouth daily.    . fluticasone furoate-vilanterol (BREO ELLIPTA) 100-25 MCG/INH AEPB Breo Ellipta 100 mcg-25 mcg/dose powder for inhalation    . Fluticasone Propionate, Inhal, (FLOVENT DISKUS) 100 MCG/BLIST AEPB Flovent Diskus 100 mcg/actuation powder for inhalation    . furosemide (LASIX) 40 MG tablet Take 40 mg by mouth.    . gabapentin (NEURONTIN) 300 MG capsule Take 1 capsule (300 mg total) by mouth 3 (three) times daily. 90 capsule 3  . hydrALAZINE (APRESOLINE) 25 MG tablet Take 25 mg by mouth 2 (two) times daily.    . Insulin Glargine (LANTUS SOLOSTAR) 100 UNIT/ML Solostar Pen Lantus Solostar U-100 Insulin 100 unit/mL (3 mL) subcutaneous pen    . insulin glargine (LANTUS) 100 UNIT/ML injection Inject into the skin.    Marland Kitchen insulin lispro  (HUMALOG) 100 UNIT/ML injection Inject into the skin 3 (three) times daily before meals.    Marland Kitchen levalbuterol (XOPENEX HFA) 45 MCG/ACT inhaler Inhale 1 puff into the lungs every 4 (four) hours as needed for wheezing.    . levalbuterol (XOPENEX) 0.63 MG/3ML nebulizer solution TK 1 INHALATION IN NEBULIZER QID  1  . levalbuterol (XOPENEX) 0.63 MG/3ML nebulizer solution Inhale 1 vial every 4 hours    . lisinopril (PRINIVIL,ZESTRIL) 10 MG tablet Take 10 mg by mouth daily.    . metoprolol tartrate (LOPRESSOR) 50 MG tablet metoprolol tartrate 50 mg tablet    . Neomycin-Bacitracin Zn-Polymyx 3.5-400-10000 OINT neomycin 3.5 mg/g-polymyxin B 10,000 unit/g-dexameth 0.1 % eye oint    . nitroGLYCERIN (NITROSTAT) 0.4 MG SL tablet Place 0.4 mg under the tongue every 5 (five) minutes as needed for chest pain.    Marland Kitchen ondansetron (ZOFRAN-ODT) 4 MG disintegrating tablet ondansetron 4 mg disintegrating tablet    . oxycodone (ROXICODONE) 30 MG immediate release tablet Take 30 mg by mouth every 4 (four) hours as needed for pain.    Marland Kitchen oxyCODONE-acetaminophen (PERCOCET) 7.5-325 MG tablet Take 1 tablet by mouth every 4 (four) hours as needed for severe pain.    . predniSONE (DELTASONE) 20 MG tablet Take 20 mg by mouth daily with breakfast.    . vancomycin (VANCOCIN) 125 MG capsule Take 125 mg by mouth as directed.     No current facility-administered medications for this visit.     ROS:   General:  No weight loss, Fever, chills  HEENT: No recent headaches, no nasal bleeding, no visual changes, no sore throat  Neurologic: No dizziness, blackouts, seizures. No recent symptoms of stroke or mini- stroke. No recent episodes of slurred speech, or temporary blindness.  Cardiac: No recent episodes of chest pain/pressure, no shortness of breath at rest.  + shortness of breath with exertion.  Denies history of atrial fibrillation or irregular heartbeat  Vascular: No history of rest pain in feet.  No history of claudication.  No  history of non-healing ulcer, No history of DVT   Pulmonary: + home oxygen, + productive cough, no hemoptysis,  No asthma or wheezing  Musculoskeletal:  [ ]  Arthritis, [ ]  Low back pain,  [ ]  Joint pain  Hematologic:No history of hypercoagulable state.  No history of easy bleeding.  No history of anemia  Gastrointestinal: No hematochezia or melena,  No gastroesophageal reflux, no trouble swallowing  Urinary: [X]  chronic Kidney disease, [X]  on HD - [X]  MWF or [ ]  TTHS, [ ]  Burning with urination, [ ]  Frequent urination, [ ]  Difficulty urinating;   Skin: No rashes  Psychological: No history of anxiety,  No history of depression   Physical Examination  Vitals:   02/08/18 0908  BP: (!) 150/81  Pulse: 75  Resp: 16  Temp: (!) 97 F (36.1 C)  TempSrc: Oral  SpO2: 100%  Weight: 184 lb 8 oz (83.7 kg)  Height: 5' 11.5" (1.816 m)    Body mass index is 25.37 kg/m.  General:  Alert and oriented, no acute distress HEENT: Normal Neck: No bruit or JVD Pulmonary: Clear to auscultation bilaterally Cardiac: Regular Rate and Rhythm without murmur Abdomen: Soft, non-tender, non-distended Skin: No rash Extremity Pulses:  2+ radial, brachial pulses bilaterally Musculoskeletal: No deformity or edema  Neurologic: Upper and lower extremity motor 5/5 and symmetric  DATA:  Patient had an upper extremity arterial duplex today as well as vein mapping ultrasound.  I reviewed and interpreted the study.  This showed a good quality cephalic vein on the right side 2-1/2 to 3 mm at the wrist progressing to 3 to 4 mm in the upper arm.  Left cephalic vein was fairly small less than 3 mm throughout its entire course, left and right basilic veins were of good quality both greater  than 4 mm in diameter in the upper arm.  His arterial duplex scan showed essentially normal brachial artery configuration with ulnar artery occlusion bilaterally triphasic flow in the right brachial and radial.  Radial artery  diameter at the wrist on the right side was 0.28 cm.  Ocular artery was 6 mm diameter.  Diffuse plaque throughout the artery bilaterally.  ASSESSMENT: Patient needs long-term hemodialysis access.  Risk benefits possible complications of procedure details were discussed the patient today including not limited to bleeding infection ischemic steal need for other procedures.  He wishes to proceed.   PLAN: We will start with his right arm since he has a defibrillator on the left side.  Plan will be for right radiocephalic versus brachiocephalic fistula scheduled for Tuesday, February 13, 2018.  We will stop his Eliquis perioperatively.   Ruta Hinds, MD [facility] [place] Office: [phone] Pager: [phone]

## 2018-02-09 ENCOUNTER — Telehealth: Payer: Self-pay | Admitting: *Deleted

## 2018-02-09 NOTE — Telephone Encounter (Signed)
Phone call to [facility] for medication hold clearance form. Follow up note sent to Dr. Kandis Cocking to send clearance ASAP.

## 2018-02-12 ENCOUNTER — Encounter (HOSPITAL_COMMUNITY): Payer: Self-pay | Admitting: *Deleted

## 2018-02-12 ENCOUNTER — Other Ambulatory Visit: Payer: Self-pay

## 2018-02-12 ENCOUNTER — Ambulatory Visit: Payer: Medicare Other | Admitting: Podiatry

## 2018-02-12 ENCOUNTER — Ambulatory Visit (INDEPENDENT_AMBULATORY_CARE_PROVIDER_SITE_OTHER): Payer: Medicare Other

## 2018-02-12 ENCOUNTER — Encounter: Payer: Self-pay | Admitting: Podiatry

## 2018-02-12 VITALS — BP 140/80 | HR 88 | Resp 16

## 2018-02-12 DIAGNOSIS — S90212A Contusion of left great toe with damage to nail, initial encounter: Secondary | ICD-10-CM | POA: Diagnosis not present

## 2018-02-12 DIAGNOSIS — L608 Other nail disorders: Secondary | ICD-10-CM | POA: Diagnosis not present

## 2018-02-12 DIAGNOSIS — M79672 Pain in left foot: Secondary | ICD-10-CM

## 2018-02-12 DIAGNOSIS — E1142 Type 2 diabetes mellitus with diabetic polyneuropathy: Secondary | ICD-10-CM

## 2018-02-12 NOTE — Progress Notes (Signed)
g left

## 2018-02-12 NOTE — Progress Notes (Signed)
Pt denies SOB and chest pain. Pt stated that he is under the care of Dr. Clovia Cuff, Cardiology at [facility]. Requested EKG tracing and Peri-op Prescription for ICD initiated; awaiting response. Pt stated that last dose of Eliquis was Saturday. Pt made aware to stop taking vitamins, fish oil and herbal medications. Do not take any NSAIDs ie: Ibuprofen, Advil, Naproxen(( Aleve), Motrin, BC and Goody Powder. Pt made aware to take 5 units of Lantus insulin (half) tonight and 5 units of Lantus the morning of procedure only  if BG is > 70. Pt made aware to check BG every 2 hours prior to arrival to hospital on DOS. Pt made aware to treat a BG < 70 with 4 ounces of apple  juice, wait 15 minutes after intervention to recheck BG, if BG remains < 70, call Short Stay unit to speak with a nurse. Pt made aware to take no Lispro Humalog at HS. Pt made aware to take 1/2 corrections dose of Lispro Humalog DOS for BG > 220. Pt verbalized understanding of all pre-op instructions. See PA, Anesthesiology, note.

## 2018-02-12 NOTE — Patient Instructions (Signed)

## 2018-02-12 NOTE — Anesthesia Preprocedure Evaluation (Addendum)
Anesthesia Evaluation  Patient identified by MRN, date of birth, ID band Patient awake    Reviewed: Allergy & Precautions, NPO status , Patient's Chart, lab work & pertinent test results  Airway Mallampati: II  TM Distance: >3 FB Neck ROM: Full    Dental  (+) Edentulous Upper   Pulmonary asthma , sleep apnea , COPD,  COPD inhaler and oxygen dependent, Current Smoker,    Pulmonary exam normal breath sounds clear to auscultation       Cardiovascular hypertension, Pt. on medications and Pt. on home beta blockers + CAD, + Past MI, + Cardiac Stents, + CABG, + Peripheral Vascular Disease and +CHF  Normal cardiovascular exam+ dysrhythmias Atrial Fibrillation + Cardiac Defibrillator [employer] investment [employer])  Rhythm:Regular Rate:Normal  ECG: NSR, rate 64  EF: 30-35%   Neuro/Psych PSYCHIATRIC DISORDERS Anxiety Depression negative neurological ROS     GI/Hepatic Neg liver ROS, GERD  Medicated and Controlled,  Endo/Other  diabetes, Insulin Dependent  Renal/GU Dialysis and ESRFRenal diseaseOn HD M, W, F     Musculoskeletal negative musculoskeletal ROS (+)   Abdominal   Peds  Hematology  (+) anemia , HLD   Anesthesia Other Findings 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  Reproductive/Obstetrics                          Anesthesia Physical Anesthesia Plan  ASA: IV  Anesthesia Plan: Regional   Post-op Pain Management:    Induction: Intravenous  PONV Risk Score and Plan: 0 and Propofol infusion and Treatment may vary due to age or medical condition  Airway Management Planned: Simple Face Mask  Additional Equipment:   Intra-op Plan:   Post-operative Plan:   Informed Consent: I have reviewed the patients History and Physical, chart, labs and discussed the procedure including the risks, benefits and alternatives for the proposed anesthesia with the patient or authorized representative who has  indicated his/her understanding and acceptance.   Dental advisory given  Plan Discussed with: CRNA  Anesthesia Plan Comments: (CAD s/p multiple percutaneous interventions (stent 2003, 2006) and CABG (2012). Ischemic cardiomyopathy with ejection fraction of 30 to 35% since March of 2012. [employer] ICD implantation 2012. Follows with cardiology at [facility], Dr. Curly Rim, who cleared pt for surgery. Pt's extensive cardiac history is well documented in Dr. Dan Humphreys note in care everywhere dated 11/21/2017.    Intraoperative magnet placement over AICD per device rep recommendations )    Anesthesia Quick Evaluation

## 2018-02-13 ENCOUNTER — Ambulatory Visit (HOSPITAL_COMMUNITY)
Admission: RE | Admit: 2018-02-13 | Discharge: 2018-02-13 | Disposition: A | Discharge status: Home / Self Care | Payer: Medicare Other | Source: Ambulatory Visit | Attending: Vascular Surgery | Admitting: Vascular Surgery

## 2018-02-13 ENCOUNTER — Ambulatory Visit (HOSPITAL_COMMUNITY): Payer: Medicare Other | Admitting: Physician Assistant

## 2018-02-13 ENCOUNTER — Encounter (HOSPITAL_COMMUNITY): Payer: Self-pay

## 2018-02-13 ENCOUNTER — Encounter (HOSPITAL_COMMUNITY)
Admission: RE | Disposition: A | Discharge status: Home / Self Care | Payer: Self-pay | Source: Ambulatory Visit | Attending: Vascular Surgery

## 2018-02-13 DIAGNOSIS — K219 Gastro-esophageal reflux disease without esophagitis: Secondary | ICD-10-CM | POA: Diagnosis not present

## 2018-02-13 DIAGNOSIS — I509 Heart failure, unspecified: Secondary | ICD-10-CM | POA: Insufficient documentation

## 2018-02-13 DIAGNOSIS — D631 Anemia in chronic kidney disease: Secondary | ICD-10-CM | POA: Diagnosis not present

## 2018-02-13 DIAGNOSIS — Z7982 Long term (current) use of aspirin: Secondary | ICD-10-CM | POA: Diagnosis not present

## 2018-02-13 DIAGNOSIS — Z79899 Other long term (current) drug therapy: Secondary | ICD-10-CM | POA: Diagnosis not present

## 2018-02-13 DIAGNOSIS — G4730 Sleep apnea, unspecified: Secondary | ICD-10-CM | POA: Diagnosis not present

## 2018-02-13 DIAGNOSIS — E1122 Type 2 diabetes mellitus with diabetic chronic kidney disease: Secondary | ICD-10-CM | POA: Insufficient documentation

## 2018-02-13 DIAGNOSIS — I132 Hypertensive heart and chronic kidney disease with heart failure and with stage 5 chronic kidney disease, or end stage renal disease: Secondary | ICD-10-CM | POA: Insufficient documentation

## 2018-02-13 DIAGNOSIS — Z794 Long term (current) use of insulin: Secondary | ICD-10-CM | POA: Diagnosis not present

## 2018-02-13 DIAGNOSIS — Z7951 Long term (current) use of inhaled steroids: Secondary | ICD-10-CM | POA: Diagnosis not present

## 2018-02-13 DIAGNOSIS — I2510 Atherosclerotic heart disease of native coronary artery without angina pectoris: Secondary | ICD-10-CM | POA: Insufficient documentation

## 2018-02-13 DIAGNOSIS — Z955 Presence of coronary angioplasty implant and graft: Secondary | ICD-10-CM | POA: Diagnosis not present

## 2018-02-13 DIAGNOSIS — Z7952 Long term (current) use of systemic steroids: Secondary | ICD-10-CM | POA: Insufficient documentation

## 2018-02-13 DIAGNOSIS — Z9981 Dependence on supplemental oxygen: Secondary | ICD-10-CM | POA: Insufficient documentation

## 2018-02-13 DIAGNOSIS — Z7901 Long term (current) use of anticoagulants: Secondary | ICD-10-CM | POA: Diagnosis not present

## 2018-02-13 DIAGNOSIS — F17200 Nicotine dependence, unspecified, uncomplicated: Secondary | ICD-10-CM | POA: Insufficient documentation

## 2018-02-13 DIAGNOSIS — I4891 Unspecified atrial fibrillation: Secondary | ICD-10-CM | POA: Diagnosis not present

## 2018-02-13 DIAGNOSIS — J449 Chronic obstructive pulmonary disease, unspecified: Secondary | ICD-10-CM | POA: Insufficient documentation

## 2018-02-13 DIAGNOSIS — N185 Chronic kidney disease, stage 5: Secondary | ICD-10-CM

## 2018-02-13 DIAGNOSIS — F341 Dysthymic disorder: Secondary | ICD-10-CM | POA: Insufficient documentation

## 2018-02-13 DIAGNOSIS — I252 Old myocardial infarction: Secondary | ICD-10-CM | POA: Diagnosis not present

## 2018-02-13 DIAGNOSIS — Z79891 Long term (current) use of opiate analgesic: Secondary | ICD-10-CM | POA: Insufficient documentation

## 2018-02-13 DIAGNOSIS — E785 Hyperlipidemia, unspecified: Secondary | ICD-10-CM | POA: Diagnosis not present

## 2018-02-13 DIAGNOSIS — Z992 Dependence on renal dialysis: Secondary | ICD-10-CM | POA: Diagnosis not present

## 2018-02-13 DIAGNOSIS — N186 End stage renal disease: Secondary | ICD-10-CM | POA: Insufficient documentation

## 2018-02-13 HISTORY — DX: Presence of dental prosthetic device (complete) (partial): Z97.2

## 2018-02-13 HISTORY — PX: AV FISTULA PLACEMENT: SHX1204

## 2018-02-13 HISTORY — DX: Unspecified atrial fibrillation: I48.91

## 2018-02-13 HISTORY — DX: Presence of automatic (implantable) cardiac defibrillator: Z95.810

## 2018-02-13 HISTORY — DX: Acute myocardial infarction, unspecified: I21.9

## 2018-02-13 HISTORY — DX: Gastro-esophageal reflux disease without esophagitis: K21.9

## 2018-02-13 HISTORY — DX: Pneumonia, unspecified organism: J18.9

## 2018-02-13 LAB — PROTIME-INR
INR: 1.08
Prothrombin Time: 13.9 seconds (ref 11.4–15.2)

## 2018-02-13 LAB — POCT I-STAT 4, (NA,K, GLUC, HGB,HCT)
Glucose, Bld: 123 mg/dL — ABNORMAL HIGH (ref 70–99)
HCT: 33 % — ABNORMAL LOW (ref 39.0–52.0)
HEMOGLOBIN: 11.2 g/dL — AB (ref 13.0–17.0)
POTASSIUM: 4 mmol/L (ref 3.5–5.1)
Sodium: 132 mmol/L — ABNORMAL LOW (ref 135–145)

## 2018-02-13 LAB — GLUCOSE, CAPILLARY: Glucose-Capillary: 98 mg/dL (ref 70–99)

## 2018-02-13 SURGERY — ARTERIOVENOUS (AV) FISTULA CREATION
Anesthesia: Monitor Anesthesia Care | Site: Arm Lower | Laterality: Right

## 2018-02-13 MED ORDER — ACETAMINOPHEN-CODEINE #3 300-30 MG PO TABS: 1.0000 | ORAL_TABLET | Freq: Four times a day (QID) | ORAL | 0 refills | Status: DC | PRN

## 2018-02-13 MED ORDER — MIDAZOLAM HCL 2 MG/2ML IJ SOLN
INTRAMUSCULAR | Status: AC
  Filled 2018-02-13: qty 2

## 2018-02-13 MED ORDER — MEPIVACAINE HCL 1.5 % IJ SOLN
INTRAMUSCULAR | Status: DC | PRN
  Administered 2018-02-13: 22 mL via PERINEURAL

## 2018-02-13 MED ORDER — SODIUM CHLORIDE 0.9 % IV SOLN
INTRAVENOUS | Status: DC
  Administered 2018-02-13: 08:00:00 via INTRAVENOUS

## 2018-02-13 MED ORDER — FENTANYL CITRATE (PF) 250 MCG/5ML IJ SOLN
INTRAMUSCULAR | Status: AC
  Filled 2018-02-13: qty 5

## 2018-02-13 MED ORDER — LIDOCAINE HCL (PF) 1 % IJ SOLN
INTRAMUSCULAR | Status: AC
  Filled 2018-02-13: qty 30

## 2018-02-13 MED ORDER — CHLORHEXIDINE GLUCONATE 4 % EX LIQD: 60.0000 mL | Freq: Once | CUTANEOUS | Status: DC

## 2018-02-13 MED ORDER — PROPOFOL 500 MG/50ML IV EMUL
INTRAVENOUS | Status: DC | PRN
  Administered 2018-02-13: 50 ug/kg/min via INTRAVENOUS

## 2018-02-13 MED ORDER — FENTANYL CITRATE (PF) 100 MCG/2ML IJ SOLN
50.0000 ug | Freq: Once | INTRAMUSCULAR | Status: AC
  Administered 2018-02-13: 50 ug via INTRAVENOUS

## 2018-02-13 MED ORDER — ONDANSETRON HCL 4 MG/2ML IJ SOLN: 4.0000 mg | Freq: Once | INTRAMUSCULAR | Status: DC | PRN

## 2018-02-13 MED ORDER — 0.9 % SODIUM CHLORIDE (POUR BTL) OPTIME
TOPICAL | Status: DC | PRN
  Administered 2018-02-13: 1000 mL

## 2018-02-13 MED ORDER — CEFAZOLIN SODIUM-DEXTROSE 2-4 GM/100ML-% IV SOLN
INTRAVENOUS | Status: AC
  Filled 2018-02-13: qty 100

## 2018-02-13 MED ORDER — FENTANYL CITRATE (PF) 100 MCG/2ML IJ SOLN
INTRAMUSCULAR | Status: AC
  Administered 2018-02-13: 50 ug via INTRAVENOUS
  Filled 2018-02-13: qty 2

## 2018-02-13 MED ORDER — SODIUM CHLORIDE 0.9 % IV SOLN
INTRAVENOUS | Status: DC | PRN
  Administered 2018-02-13: 500 mL

## 2018-02-13 MED ORDER — LIDOCAINE HCL (PF) 1 % IJ SOLN
INTRAMUSCULAR | Status: DC | PRN
  Administered 2018-02-13: 8 mL via INTRADERMAL

## 2018-02-13 MED ORDER — CEFAZOLIN SODIUM-DEXTROSE 2-4 GM/100ML-% IV SOLN
2.0000 g | INTRAVENOUS | Status: AC
  Administered 2018-02-13: 2 g via INTRAVENOUS

## 2018-02-13 MED ORDER — HEPARIN SODIUM (PORCINE) 1000 UNIT/ML IJ SOLN
INTRAMUSCULAR | Status: DC | PRN
  Administered 2018-02-13: 5000 [IU] via INTRAVENOUS

## 2018-02-13 MED ORDER — SODIUM CHLORIDE 0.9 % IV SOLN
INTRAVENOUS | Status: AC
  Filled 2018-02-13: qty 1.2

## 2018-02-13 MED ORDER — FENTANYL CITRATE (PF) 100 MCG/2ML IJ SOLN: 25.0000 ug | INTRAMUSCULAR | Status: DC | PRN

## 2018-02-13 SURGICAL SUPPLY — 36 items
ARMBAND PINK RESTRICT EXTREMIT (MISCELLANEOUS) ×2 IMPLANT
BNDG ESMARK 4X9 LF (GAUZE/BANDAGES/DRESSINGS) ×2 IMPLANT
CANISTER SUCT 3000ML PPV (MISCELLANEOUS) ×2 IMPLANT
CANNULA VESSEL 3MM 2 BLNT TIP (CANNULA) ×2 IMPLANT
CLIP VESOCCLUDE MED 6/CT (CLIP) ×2 IMPLANT
CLIP VESOCCLUDE SM WIDE 6/CT (CLIP) ×2 IMPLANT
COVER PROBE W GEL 5X96 (DRAPES) IMPLANT
COVER WAND RF STERILE (DRAPES) ×2 IMPLANT
DECANTER SPIKE VIAL GLASS SM (MISCELLANEOUS) ×2 IMPLANT
DERMABOND ADHESIVE PROPEN (GAUZE/BANDAGES/DRESSINGS) ×1
DERMABOND ADVANCED (GAUZE/BANDAGES/DRESSINGS) ×1
DERMABOND ADVANCED .7 DNX12 (GAUZE/BANDAGES/DRESSINGS) ×1 IMPLANT
DERMABOND ADVANCED .7 DNX6 (GAUZE/BANDAGES/DRESSINGS) ×1 IMPLANT
DRAIN PENROSE 1/4X12 LTX STRL (WOUND CARE) ×2 IMPLANT
ELECT REM PT RETURN 9FT ADLT (ELECTROSURGICAL) ×2
ELECTRODE REM PT RTRN 9FT ADLT (ELECTROSURGICAL) ×1 IMPLANT
GLOVE BIO SURGEON STRL SZ7.5 (GLOVE) ×2 IMPLANT
GOWN STRL REUS W/ TWL LRG LVL3 (GOWN DISPOSABLE) ×3 IMPLANT
GOWN STRL REUS W/TWL LRG LVL3 (GOWN DISPOSABLE) ×3
KIT BASIN OR (CUSTOM PROCEDURE TRAY) ×2 IMPLANT
KIT TURNOVER KIT B (KITS) ×2 IMPLANT
LOOP VESSEL MINI RED (MISCELLANEOUS) IMPLANT
NS IRRIG 1000ML POUR BTL (IV SOLUTION) ×2 IMPLANT
PACK CV ACCESS (CUSTOM PROCEDURE TRAY) ×2 IMPLANT
PAD ARMBOARD 7.5X6 YLW CONV (MISCELLANEOUS) ×4 IMPLANT
SPONGE SURGIFOAM ABS GEL 100 (HEMOSTASIS) IMPLANT
SUT PROLENE 6 0 BV (SUTURE) ×4 IMPLANT
SUT PROLENE 7 0 BV 1 (SUTURE) ×2 IMPLANT
SUT SILK 3 0 (SUTURE) ×1
SUT SILK 3-0 18XBRD TIE 12 (SUTURE) ×1 IMPLANT
SUT VIC AB 3-0 SH 27 (SUTURE) ×1
SUT VIC AB 3-0 SH 27X BRD (SUTURE) ×1 IMPLANT
SUT VICRYL 4-0 PS2 18IN ABS (SUTURE) ×2 IMPLANT
TOWEL GREEN STERILE (TOWEL DISPOSABLE) ×2 IMPLANT
UNDERPAD 30X30 (UNDERPADS AND DIAPERS) ×2 IMPLANT
WATER STERILE IRR 1000ML POUR (IV SOLUTION) ×2 IMPLANT

## 2018-02-13 NOTE — Anesthesia Procedure Notes (Signed)
Anesthesia Regional Block: Supraclavicular block   Pre-Anesthetic Checklist: ,, timeout performed, Correct Patient, Correct Site, Correct Laterality, Correct Procedure, Correct Position, site marked, Risks and benefits discussed,  Surgical consent,  Pre-op evaluation,  At surgeon's request and post-op pain management  Laterality: Right  Prep: chloraprep       Needles:  Injection technique: Single-shot  Needle Type: Echogenic Stimulator Needle     Needle Length: 9cm  Needle Gauge: 21     Additional Needles:   Procedures:,,,, ultrasound used (permanent image in chart),,,,  Narrative:  Start time: 02/13/2018 9:00 AM End time: 02/13/2018 9:10 AM Injection made incrementally with aspirations every 5 mL.  Performed by: Personally  Anesthesiologist: Murvin Natal, MD  Additional Notes: Functioning IV was confirmed and monitors were applied.  A timeout was performed. Sterile prep, hand hygiene and sterile gloves were used. A 18mm 21ga Arrow echogenic stimulator needle was used. Negative aspiration and negative test dose prior to incremental administration of local anesthetic. The patient tolerated the procedure well.  Ultrasound guidance: relevent anatomy identified, needle position confirmed, local anesthetic spread visualized around nerve(s), vascular puncture avoided.  Image printed for medical record.

## 2018-02-13 NOTE — Progress Notes (Signed)
Orthopedic Tech Progress Note Patient Details:  Jose Sellers 18-Jul-1960 [id number]  Ortho Devices Type of Ortho Device: Arm sling Ortho Device/Splint Location: delivered to rn Ortho Device/Splint Interventions: Renard Matter 02/13/2018, 11:53 AM

## 2018-02-13 NOTE — Op Note (Signed)
Procedure: Right Radial Cephalic AV fistula   Preop: ESRD   Postop: ESRD   Anesthesia: Supraclavicular block with local  Assistant: Pollard Cellar RNFA  Findings: 3.5 mm cephalic vein       2.5 mm radial artery moderate to heavy calcification  Procedure Details:  The left upper extremity was prepped and draped in usual sterile fashion. Local anesthesia was infiltrated midway between the cephalic and radial artery anatomically.  A longitudinal skin incision was then made in this location at the distal left forearm. The incision was carried into the subcutaneous tissues down to level cephalic vein. The vein had some spasm but was overall reasonable quality about 3.5 mm diameter but accepting a 5 mm dilator. The vein was dissected free circumferentially and small side branches ligated and divided between silk ties. The distal end was ligated and the vein probed and found to accept up to a 5 mm dilator. This was gently distended with heparinized saline, spatulated, and marked for orientation. Next the radial artery was dissected free in the medial portion incision. It was moderately to heavily calcified.  The artery was 2.5 mm in diameter but had a reasonable pulse. The vessel loops were placed proximal and distal to the planned site of arteriotomy. The patient was given 5000 units of intravenous heparin. After appropriate circulation time, the vessel loops were used to control the artery.  It was too calcified for this so it was controlled proximally and distally with fistula clamps. A longitudinal opening was made in the right radial artery. The vein was controlled proximally with a fine bulldog clamp. The vein was then swung over to the artery and sewn end of vein to side of artery using a running 7-0 Prolene suture. Just prior to completion, the anastomosis was fore bled back bled and thoroughly flushed. The anastomosis was secured, vessel loops released, and there was a palpable thrill in the fistula  immediately. After hemostasis was obtained, the subcutaneous tissues were reapproximated using a running 3-0 Vicryl suture. The skin was then closed with a 4 0 Vicryl subcuticular stitch. Dermabond was applied to the skin incision. The patient tolerated the procedure well and there were no complications. Instrument sponge and needle count were correct at the end of the case. The patient was taken to PACU in stable condition.   Ruta Hinds, MD  [facility]  Office: [phone]  Pager: [phone]

## 2018-02-13 NOTE — Interval H&P Note (Signed)
History and Physical Interval Note:  02/13/2018 9:27 AM  Jose Sellers  has presented today for surgery, with the diagnosis of 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  The various methods of treatment have been discussed with the patient and family. After consideration of risks, benefits and other options for treatment, the patient has consented to  Procedure(s): CREATION OF RADIOCEPHALIC ARTERIOVENOUS FISTULA RIGHT ARM (Right) as a surgical intervention .  The patient's history has been reviewed, patient examined, no change in status, stable for surgery.  I have reviewed the patient's chart and labs.  Questions were answered to the patient's satisfaction.     Ruta Hinds

## 2018-02-13 NOTE — Progress Notes (Signed)
Rep for [place] called. Emergency orders placed in the chart.

## 2018-02-13 NOTE — Anesthesia Postprocedure Evaluation (Signed)
Anesthesia Post Note  Patient: Jose Sellers  Procedure(s) Performed: CREATION OF RADIOCEPHALIC ARTERIOVENOUS FISTULA RIGHT ARM (Right Arm Lower)     Patient location during evaluation: PACU Anesthesia Type: Regional Level of consciousness: awake and alert Pain management: pain level controlled Vital Signs Assessment: post-procedure vital signs reviewed and stable Respiratory status: spontaneous breathing, nonlabored ventilation, respiratory function stable and patient connected to nasal cannula oxygen Cardiovascular status: stable and blood pressure returned to baseline Postop Assessment: no apparent nausea or vomiting Anesthetic complications: no    Last Vitals:  Vitals:   02/13/18 1130 02/13/18 1141  BP: 135/65 134/74  Pulse: 67 63  Resp: 17 16  Temp:  (!) 36.3 C  SpO2: 94% 95%    Last Pain:  Vitals:   02/13/18 1141  TempSrc:   PainSc: 0-No pain                 Eunique Balik P Arianis [place]

## 2018-02-13 NOTE — Transfer of Care (Signed)
Immediate Anesthesia Transfer of Care Note  Patient: Jose Sellers  Procedure(s) Performed: CREATION OF RADIOCEPHALIC ARTERIOVENOUS FISTULA RIGHT ARM (Right Arm Lower)  Patient Location: PACU  Anesthesia Type:MAC and Regional  Level of Consciousness: awake, alert  and oriented  Airway & Oxygen Therapy: Patient Spontanous Breathing and Patient connected to face mask oxygen  Post-op Assessment: Report given to RN and Post -op Vital signs reviewed and stable  Post vital signs: Reviewed and stable  Last Vitals:  Vitals Value Taken Time  BP 139/66 02/13/2018 11:15 AM  Temp 36.3 C 02/13/2018 11:15 AM  Pulse 67 02/13/2018 11:16 AM  Resp 20 02/13/2018 11:16 AM  SpO2 94 % 02/13/2018 11:16 AM  Vitals shown include unvalidated device data.  Last Pain:  Vitals:   02/13/18 1115  TempSrc:   PainSc: (P) 0-No pain      Patients Stated Pain Goal: 2 (93/57/01 7793)  Complications: No apparent anesthesia complications

## 2018-02-14 ENCOUNTER — Encounter (HOSPITAL_COMMUNITY): Payer: Self-pay | Admitting: Vascular Surgery

## 2018-02-15 ENCOUNTER — Ambulatory Visit: Payer: Medicare Other | Admitting: Sports Medicine

## 2018-02-15 ENCOUNTER — Telehealth: Payer: Self-pay | Admitting: Vascular Surgery

## 2018-02-15 NOTE — Telephone Encounter (Signed)
sch appt spk to pt 03/29/2018 2pm Dialysis duplex 3pm p/o PA

## 2018-02-15 NOTE — Telephone Encounter (Signed)
-----   Message from Mena Goes, RN sent at 02/13/2018  3:17 PM EST ----- Regarding: 4-6 weeks with duplex   ----- Message ----- From: Elam Dutch, MD Sent: 02/13/2018  11:16 AM EST To: Vvs Charge Pool  Right Radial Cephalic AV fistula   Nurse asst  Needs follow up in [facility] clinic with duplex 4-6 weeks  Ruta Hinds

## 2018-02-16 ENCOUNTER — Ambulatory Visit: Payer: Medicare Other | Admitting: Sports Medicine

## 2018-02-18 NOTE — Progress Notes (Signed)
Subjective:  Patient ID: Jose Sellers, male    DOB: 09/10/1960,  MRN: [id number]  Chief Complaint  Patient presents with  . nail injury    Possible toenail injury: L hallux x 1 mo; 10/10 sharp constant pain Pt. states," I think I cut my toe or toenail in the bathtub or hurt it somehow, since ther was bleeding all over the tub." tx: abx cream -pt denies N/V/F?Ch -w/ bleeding, swelling, and bruising -FBS: 118 A1C: na PCP: [facility] x 1 mo    57 y.o. male presents for wound care.  History as above.  Review of Systems: Negative except as noted in the HPI. Denies N/V/F/Ch.  Past Medical History:  Diagnosis Date  . A-fib ([place])   . AICD (automatic cardioverter/defibrillator) present   . Anal fissure   . Anxiety   . Asthma   . CAD (coronary artery disease)   . CHF (congestive heart failure) ([place])   . COPD (chronic obstructive pulmonary disease) ([place])   . Degeneration of lumbar or lumbosacral intervertebral disc   . DM (diabetes mellitus), type 2, uncontrolled w/neurologic complication ([place])   . Dysthymic disorder   . End stage renal disease on dialysis ([place])   . Essential (primary) hypertension   . GERD (gastroesophageal reflux disease)   . Hx of colonic polyps   . Hyperlipemia   . Myocardial infarction ([place])   . Pneumonia   . Recurrent major depression ([place])   . Sleep apnea   . Ventral hernia without obstruction or gangrene   . Wears dentures     Current Outpatient Medications:  .  amiodarone (PACERONE) 200 MG tablet, Take 200 mg by mouth daily., Disp: , Rfl:  .  amLODipine (NORVASC) 5 MG tablet, Take 10 mg by mouth daily. , Disp: , Rfl:  .  apixaban (ELIQUIS) 2.5 MG TABS tablet, Take 2.5 mg by mouth 2 (two) times daily. , Disp: , Rfl:  .  aspirin 81 MG tablet, Take 81 mg by mouth daily. , Disp: , Rfl:  .  atorvastatin (LIPITOR) 40 MG tablet, Take 40 mg by mouth at bedtime. , Disp: , Rfl:  .  B Complex-C-Folic Acid (DIALYVITE 950) 0.8 MG TABS, Take 1 tablet by mouth every  evening., Disp: , Rfl: 4 .  Besifloxacin HCl (BESIVANCE) 0.6 % SUSP, Place 1 drop into the left eye 3 (three) times daily., Disp: , Rfl:  .  Bromfenac Sodium (PROLENSA) 0.07 % SOLN, Place 1 drop into the left eye at bedtime., Disp: , Rfl:  .  calcitRIOL (ROCALTROL) 0.25 MCG capsule, Take 0.25 mcg by mouth every Monday, Wednesday, and Friday with hemodialysis. , Disp: , Rfl:  .  carvedilol (COREG) 6.25 MG tablet, Take 6.25 mg by mouth 2 (two) times daily with a meal., Disp: , Rfl:  .  Difluprednate (DUREZOL) 0.05 % EMUL, Place 1 drop into the left eye 3 (three) times daily., Disp: , Rfl:  .  esomeprazole (NEXIUM) 40 MG capsule, Take 40 mg by mouth daily before breakfast. , Disp: , Rfl:  .  ferric citrate (AURYXIA) 1 GM 210 MG(Fe) tablet, Take 210 mg by mouth 3 (three) times daily., Disp: , Rfl:  .  FLUoxetine (PROZAC) 20 MG capsule, Take 20 mg by mouth daily. , Disp: , Rfl:  .  furosemide (LASIX) 40 MG tablet, Take 40 mg by mouth daily. , Disp: , Rfl:  .  gabapentin (NEURONTIN) 300 MG capsule, Take 1 capsule (300 mg total) by mouth 3 (three) times daily.,  Disp: 90 capsule, Rfl: 3 .  hydrALAZINE (APRESOLINE) 25 MG tablet, Take 25 mg by mouth 2 (two) times daily., Disp: , Rfl:  .  Insulin Glargine (LANTUS SOLOSTAR) 100 UNIT/ML Solostar Pen, Inject 10 Units into the skin 2 (two) times daily. , Disp: , Rfl:  .  insulin lispro (HUMALOG KWIKPEN) 100 UNIT/ML KwikPen, Inject 25-32 Units into the skin 3 (three) times daily. Sliding Scale Insulin, Disp: , Rfl:  .  levalbuterol (XOPENEX HFA) 45 MCG/ACT inhaler, Inhale 1 puff into the lungs every 4 (four) hours as needed for wheezing., Disp: , Rfl:  .  levalbuterol (XOPENEX) 0.63 MG/3ML nebulizer solution, Inhale 0.63 mg into the lungs every 8 (eight) hours as needed for wheezing or shortness of breath. , Disp: , Rfl: 1 .  lidocaine-prilocaine (EMLA) cream, Apply 1 application topically See admin instructions. Apply small amount to access site 1-2 hours prior to  dialysis., Disp: , Rfl: 12 .  lisinopril (PRINIVIL,ZESTRIL) 10 MG tablet, Take 10 mg by mouth daily., Disp: , Rfl:  .  nitroGLYCERIN (NITROSTAT) 0.4 MG SL tablet, Place 0.4 mg under the tongue every 5 (five) minutes as needed for chest pain., Disp: , Rfl:  .  oxycodone (ROXICODONE) 30 MG immediate release tablet, Take 30 mg by mouth every 8 (eight) hours as needed for pain. , Disp: , Rfl:  .  oxyCODONE-acetaminophen (PERCOCET) 7.5-325 MG tablet, Take 1 tablet by mouth every 6 (six) hours as needed for severe pain. , Disp: , Rfl:  .  acetaminophen-codeine (TYLENOL #3) 300-30 MG tablet, Take 1-2 tablets by mouth every 6 (six) hours as needed for severe pain., Disp: 6 tablet, Rfl: 0  Social History   Tobacco Use  Smoking Status Current Some Day Smoker  . Types: Cigarettes  Smokeless Tobacco Never Used    Allergies  Allergen Reactions  . Fluticasone Furoate-Vilanterol Palpitations  . Morphine Rash   Objective:   Vitals:   02/12/18 1655  BP: 140/80  Pulse: 88  Resp: 16   There is no height or weight on file to calculate BMI. Constitutional Well developed. Well nourished.  Vascular Dorsalis pedis pulses palpable bilaterally. Posterior tibial pulses palpable bilaterally. Capillary refill normal to all digits.  No cyanosis or clubbing noted. Pedal hair growth normal.  Neurologic Normal speech. Oriented to person, place, and time. Protective sensation absent  Dermatologic Nail contusion left hallux with onycholysis.  Slight active bleeding edema bruising of the toe noted  Orthopedic: No pain to palpation either foot.   Radiographs: Taken and reviewed no acute fractures dislocations Assessment:   1. Diabetic polyneuropathy associated with type 2 diabetes mellitus ([place])   2. Left foot pain   3. Onychomadesis   4. Contusion of left great toe with damage to nail, initial encounter    Plan:  Patient was evaluated and treated and all questions answered.  Nail contusion left  great toenail -Nail avulsed as below -Educated on soaking instructions X-rays taken reviewed no acute fractures or dislocations-  Procedure: Avulsion of toenail Location: Left 1st toe  Anesthesia: Lidocaine 1% plain; 1.5 mL and Marcaine 0.5% plain; 1.5 mL, digital block. Skin Prep: Betadine. Dressing: Silvadene; telfa; dry, sterile, compression dressing. Technique: Following skin prep, the toe was exsanguinated and a tourniquet was secured at the base of the toe. The nail was freed and avulsed with a hemostat. The area was cleansed. The tourniquet was then removed and sterile dressing applied. Disposition: Patient tolerated procedure well.    Return in about 2 weeks (  around 02/26/2018) for nail contusion f/u.

## 2018-02-19 ENCOUNTER — Other Ambulatory Visit: Payer: Self-pay | Admitting: Podiatry

## 2018-02-19 DIAGNOSIS — E1142 Type 2 diabetes mellitus with diabetic polyneuropathy: Secondary | ICD-10-CM

## 2018-02-19 DIAGNOSIS — M79672 Pain in left foot: Secondary | ICD-10-CM

## 2018-02-19 DIAGNOSIS — S90212A Contusion of left great toe with damage to nail, initial encounter: Secondary | ICD-10-CM

## 2018-02-26 ENCOUNTER — Ambulatory Visit: Payer: Medicare Other | Admitting: Podiatry

## 2018-02-27 ENCOUNTER — Ambulatory Visit: Payer: Medicare Other | Admitting: Podiatry

## 2018-03-06 ENCOUNTER — Encounter: Payer: Medicare Other | Admitting: Vascular Surgery

## 2018-03-06 ENCOUNTER — Inpatient Hospital Stay (HOSPITAL_COMMUNITY): Admission: RE | Admit: 2018-03-06 | Payer: Medicare Other | Source: Ambulatory Visit

## 2018-03-06 ENCOUNTER — Encounter (HOSPITAL_COMMUNITY): Payer: Medicare Other

## 2018-03-07 ENCOUNTER — Encounter: Payer: Self-pay | Admitting: Vascular Surgery

## 2018-03-07 ENCOUNTER — Other Ambulatory Visit: Payer: Self-pay

## 2018-03-07 DIAGNOSIS — Z992 Dependence on renal dialysis: Principal | ICD-10-CM

## 2018-03-07 DIAGNOSIS — N186 End stage renal disease: Secondary | ICD-10-CM

## 2018-03-14 ENCOUNTER — Encounter: Payer: Self-pay | Admitting: Sports Medicine

## 2018-03-14 ENCOUNTER — Ambulatory Visit (INDEPENDENT_AMBULATORY_CARE_PROVIDER_SITE_OTHER): Payer: Medicare Other | Admitting: Sports Medicine

## 2018-03-14 VITALS — BP 136/75 | HR 77 | Resp 16

## 2018-03-14 DIAGNOSIS — E1142 Type 2 diabetes mellitus with diabetic polyneuropathy: Secondary | ICD-10-CM

## 2018-03-14 DIAGNOSIS — M79674 Pain in right toe(s): Secondary | ICD-10-CM | POA: Diagnosis not present

## 2018-03-14 DIAGNOSIS — M79672 Pain in left foot: Secondary | ICD-10-CM

## 2018-03-14 DIAGNOSIS — B351 Tinea unguium: Secondary | ICD-10-CM

## 2018-03-14 DIAGNOSIS — S90212D Contusion of left great toe with damage to nail, subsequent encounter: Secondary | ICD-10-CM

## 2018-03-14 DIAGNOSIS — M79675 Pain in left toe(s): Secondary | ICD-10-CM

## 2018-03-14 MED ORDER — GABAPENTIN 300 MG PO CAPS: 300.0000 mg | ORAL_CAPSULE | Freq: Three times a day (TID) | ORAL | 3 refills | Status: DC

## 2018-03-14 NOTE — Progress Notes (Signed)
Subjective: Jose Sellers is a 57 y.o. male patient with history of diabetes who presents to office today complaining of long,mildly painful nails  while ambulating in shoes; unable to trim and for nail check on left 1st toe, had nail procedure last visit where the nail was removed because of injury after bumping toe on bathtub. Patient states that the glucose reading this morning was 119 mg/dl. Admits some swelling and redness to toe but no pain.   Patient Active Problem List   Diagnosis Date Noted  . Typical atrial flutter ([place]) 10/16/2017  . Acute respiratory failure with hypoxia ([place]) 09/08/2017  . C. difficile diarrhea 09/08/2017  . Peritonitis associated with peritoneal dialysis ([place]) 08/16/2017  . At high risk for falls 07/27/2017  . Arteriosclerotic vascular disease 03/16/2017  . Chronic heart disease 03/16/2017  . End-stage renal disease ([place]) 03/16/2017  . Hypercholesterolemia 03/16/2017  . Hypertensive disorder 03/16/2017  . Anemia of unknown etiology 07/07/2016  . Malaise and fatigue 07/07/2016  . Chronic pain syndrome 06/20/2016  . Secondary hyperparathyroidism ([place]) 12/16/2015  . Stage 5 chronic kidney disease ([place]) 12/16/2015  . Chronic systolic congestive heart failure ([place]) 11/25/2015  . Tobacco use 11/25/2015  . Ventral hernia without obstruction or gangrene 10/26/2015  . Psychosexual dysfunction with inhibited sexual excitement 08/27/2015  . Anxiety 08/26/2015  . COPD (chronic obstructive pulmonary disease) ([place]) 08/26/2015  . DDD (degenerative disc disease), lumbosacral 08/26/2015  . GERD (gastroesophageal reflux disease) 08/26/2015  . Hypothyroidism 08/26/2015  . Microalbuminuria 08/26/2015  . Mixed hyperlipidemia 08/26/2015  . Osteoarthritis of multiple joints 08/26/2015  . Peripheral vascular disease ([place]) 08/26/2015  . Abdominal hernia 06/16/2015  . CAD (coronary artery disease) 04/07/2015  . ICD (implantable cardioverter-defibrillator) in place  04/07/2015  . OSA (obstructive sleep apnea) 04/07/2015  . Essential hypertension 03/06/2015  . Retinopathy, diabetic, bilateral ([place]) 03/06/2015  . Uncontrolled type 2 diabetes mellitus with diabetic polyneuropathy, with long-term current use of insulin ([place]) 03/06/2015  . Atherosclerosis with claudication of extremity ([place]) 08/01/2014  . Mild episode of recurrent major depressive disorder ([place]) 05/01/2013  . Panic disorder 05/01/2013   Current Outpatient Medications on File Prior to Visit  Medication Sig Dispense Refill  . acetaminophen-codeine (TYLENOL #3) 300-30 MG tablet Take 1-2 tablets by mouth every 6 (six) hours as needed for severe pain. 6 tablet 0  . amiodarone (PACERONE) 200 MG tablet Take 200 mg by mouth daily.    Marland Kitchen amLODipine (NORVASC) 5 MG tablet Take 10 mg by mouth daily.     Marland Kitchen apixaban (ELIQUIS) 2.5 MG TABS tablet Take 2.5 mg by mouth 2 (two) times daily.     Marland Kitchen aspirin 81 MG tablet Take 81 mg by mouth daily.     Marland Kitchen atorvastatin (LIPITOR) 40 MG tablet Take 40 mg by mouth at bedtime.     . B Complex-C-Folic Acid (DIALYVITE 706) 0.8 MG TABS Take 1 tablet by mouth every evening.  4  . Besifloxacin HCl (BESIVANCE) 0.6 % SUSP Place 1 drop into the left eye 3 (three) times daily.    . Bromfenac Sodium (PROLENSA) 0.07 % SOLN Place 1 drop into the left eye at bedtime.    . calcitRIOL (ROCALTROL) 0.25 MCG capsule Take 0.25 mcg by mouth every Monday, Wednesday, and Friday with hemodialysis.     Marland Kitchen carvedilol (COREG) 6.25 MG tablet Take 6.25 mg by mouth 2 (two) times daily with a meal.    . Difluprednate (DUREZOL) 0.05 % EMUL Place 1 drop into the left eye  3 (three) times daily.    Marland Kitchen esomeprazole (NEXIUM) 40 MG capsule Take 40 mg by mouth daily before breakfast.     . ferric citrate (AURYXIA) 1 GM 210 MG(Fe) tablet Take 210 mg by mouth 3 (three) times daily.    Marland Kitchen FLUoxetine (PROZAC) 20 MG capsule Take 20 mg by mouth daily.     . furosemide (LASIX) 40 MG tablet Take 40 mg by mouth daily.      . hydrALAZINE (APRESOLINE) 25 MG tablet Take 25 mg by mouth 2 (two) times daily.    . Insulin Glargine (LANTUS SOLOSTAR) 100 UNIT/ML Solostar Pen Inject 10 Units into the skin 2 (two) times daily.     . insulin lispro (HUMALOG KWIKPEN) 100 UNIT/ML KwikPen Inject 25-32 Units into the skin 3 (three) times daily. Sliding Scale Insulin    . levalbuterol (XOPENEX HFA) 45 MCG/ACT inhaler Inhale 1 puff into the lungs every 4 (four) hours as needed for wheezing.    . levalbuterol (XOPENEX) 0.63 MG/3ML nebulizer solution Inhale 0.63 mg into the lungs every 8 (eight) hours as needed for wheezing or shortness of breath.   1  . lidocaine-prilocaine (EMLA) cream Apply 1 application topically See admin instructions. Apply small amount to access site 1-2 hours prior to dialysis.  12  . lisinopril (PRINIVIL,ZESTRIL) 10 MG tablet Take 10 mg by mouth daily.    . metoprolol tartrate (LOPRESSOR) 25 MG tablet Take by mouth.    . nitroGLYCERIN (NITROSTAT) 0.4 MG SL tablet Place 0.4 mg under the tongue every 5 (five) minutes as needed for chest pain.    Marland Kitchen oxycodone (ROXICODONE) 30 MG immediate release tablet Take 30 mg by mouth every 8 (eight) hours as needed for pain.     Marland Kitchen oxyCODONE-acetaminophen (PERCOCET) 7.5-325 MG tablet Take 1 tablet by mouth every 6 (six) hours as needed for severe pain.     . sevelamer carbonate (RENVELA) 800 MG tablet Take by mouth.    . tiotropium (SPIRIVA) 18 MCG inhalation capsule Place into inhaler and inhale.     No current facility-administered medications on file prior to visit.    Allergies  Allergen Reactions  . Albuterol Palpitations    SVT  . Prednisone Shortness Of Breath    Wife stated that patient develops breathing issues.  . Fluticasone Furoate-Vilanterol Palpitations  . Morphine Rash    Recent Results (from the past 2160 hour(s))  I-STAT 4, (NA,K, GLUC, HGB,HCT)     Status: Abnormal   Collection Time: 02/13/18  7:49 AM  Result Value Ref Range   Sodium 132 (L) 135  - 145 mmol/L   Potassium 4.0 3.5 - 5.1 mmol/L   Glucose, Bld 123 (H) 70 - 99 mg/dL   HCT 33.0 (L) 39.0 - 52.0 %   Hemoglobin 11.2 (L) 13.0 - 17.0 g/dL  Protime-INR     Status: None   Collection Time: 02/13/18  7:53 AM  Result Value Ref Range   Prothrombin Time 13.9 11.4 - 15.2 seconds   INR 1.08     Comment: Performed at [facility], [place] [address], [place], [place] [address]  Glucose, capillary     Status: None   Collection Time: 02/13/18 11:15 AM  Result Value Ref Range   Glucose-Capillary 98 70 - 99 mg/dL   Comment 1 Notify RN     Objective: General: Patient is awake, alert, and oriented x 3 and in no acute distress.  Integument: Skin is warm, dry and supple bilateral. Nails are  tender, long, thickened and  dystrophic with subungual debris, consistent with onychomycosis, 1-5 on right and 2-5 on left.. Left hallux nailbed is macerated with dry blood to medial aspect with no warmth, no redness, no significant swelling, No other signs of infection. No open lesions or preulcerative lesions present bilateral. Remaining integument unremarkable.  Vasculature:  Dorsalis Pedis pulse 2/4 bilateral. Posterior Tibial pulse  1/4 bilateral.  Capillary fill time <5 sec 1-5 bilateral. Positive hair growth to the level of the digits. Temperature gradient within normal limits. No varicosities present bilateral. No edema present bilateral.   Neurology: The patient has diminished sensation measured with a 5.07/10g Semmes Weinstein Monofilament at all pedal sites bilateral . Vibratory sensation diminished bilateral with tuning fork. No Babinski sign present bilateral.   Musculoskeletal: No symptomatic pedal deformities noted bilateral. Muscular strength 5/5 in all lower extremity muscular groups bilateral without pain on range of motion . No tenderness with calf compression bilateral.  Assessment and Plan: Problem List Items Addressed This Visit    None    Visit Diagnoses    Contusion of  left great toe with damage to nail, subsequent encounter    -  Primary   Diabetic polyneuropathy associated with type 2 diabetes mellitus (HCC)       Relevant Medications   gabapentin (NEURONTIN) 300 MG capsule   Left foot pain       Pain due to onychomycosis of toenails of both feet         -Examined patient. -Discussed and educated patient on diabetic foot care, especially with  regards to the vascular, neurological and musculoskeletal systems.  -Mechanically debrided all nails 1-5 on right and 2-5 on left using sterile nail nipper and filed with dremel without incident  -Recommend continue with soaking with epsom salt and open to air at night until it has healed. May cover with neosporin and bandaid during the day. -Continue with Gabapentin 300; Refill provided   -Patient to return  in 3 months for at risk foot care and med check -Patient advised to call the office if any problems or questions arise in the meantime.  Landis Martins, DPM

## 2018-03-29 ENCOUNTER — Encounter (HOSPITAL_COMMUNITY): Payer: Medicare Other

## 2018-04-04 ENCOUNTER — Encounter (HOSPITAL_COMMUNITY): Payer: Medicare Other

## 2018-04-05 ENCOUNTER — Ambulatory Visit (HOSPITAL_COMMUNITY)
Admission: RE | Admit: 2018-04-05 | Discharge: 2018-04-05 | Disposition: A | Discharge status: Home / Self Care | Payer: Medicare Other | Source: Ambulatory Visit | Attending: Vascular Surgery | Admitting: Vascular Surgery

## 2018-04-05 ENCOUNTER — Ambulatory Visit (INDEPENDENT_AMBULATORY_CARE_PROVIDER_SITE_OTHER): Payer: Self-pay | Admitting: Physician Assistant

## 2018-04-05 ENCOUNTER — Encounter: Payer: Self-pay | Admitting: *Deleted

## 2018-04-05 ENCOUNTER — Other Ambulatory Visit: Payer: Self-pay

## 2018-04-05 VITALS — BP 142/75 | HR 60 | Resp 16 | Ht 71.5 in | Wt 184.4 lb

## 2018-04-05 DIAGNOSIS — Z992 Dependence on renal dialysis: Secondary | ICD-10-CM | POA: Diagnosis present

## 2018-04-05 DIAGNOSIS — I70219 Atherosclerosis of native arteries of extremities with intermittent claudication, unspecified extremity: Secondary | ICD-10-CM

## 2018-04-05 DIAGNOSIS — I739 Peripheral vascular disease, unspecified: Secondary | ICD-10-CM

## 2018-04-05 DIAGNOSIS — N186 End stage renal disease: Secondary | ICD-10-CM

## 2018-04-05 DIAGNOSIS — Z720 Tobacco use: Secondary | ICD-10-CM

## 2018-04-05 NOTE — H&P (View-Only) (Signed)
Established Critical Limb Ischemia Patient   History of Present Illness   Jose Sellers is a 58 y.o. year old male who presents for postoperative follow-up for: right radiocephalic arteriovenous fistula by Dr. Oneida Alar (Date: 02/13/18).  The patient's wounds are well healed.  The patient denies steal symptoms.  The patient is able to complete their activities of daily living.  He is dialyzing from [facility] under the care of Dr. Justin Mend on a MWF schedule at [facility] kidney center.  Today he also complains of pain in his feet and legs with ambulation.  PMH also significant for IDDM and peripheral neuropathy.  He says he is concerned that symptoms are related to circulation because he has a family history of poor circulation.  Patient states he had his left great toenail clipped about a month and a half ago.  Since that time he has had a small sore that has progressively gotten larger.  He has seen [facility] foot and ankle for this however has not had any surgical intervention or any mention of osteomyelitis.  Patient states he is able to walk about 100 feet before having to rest due to cramping in his left more than right calf.  He is an occasional smoker.  He has never had any work done on his lower extremity circulation nor has he had any imaging.  He is taking aspirin and statin daily.  Current Outpatient Medications  Medication Sig Dispense Refill  . acetaminophen-codeine (TYLENOL #3) 300-30 MG tablet Take 1-2 tablets by mouth every 6 (six) hours as needed for severe pain. 6 tablet 0  . amiodarone (PACERONE) 200 MG tablet Take 200 mg by mouth daily.    Marland Kitchen amLODipine (NORVASC) 5 MG tablet Take 10 mg by mouth daily.     Marland Kitchen apixaban (ELIQUIS) 2.5 MG TABS tablet Take 2.5 mg by mouth 2 (two) times daily.     Marland Kitchen aspirin 81 MG tablet Take 81 mg by mouth daily.     Marland Kitchen atorvastatin (LIPITOR) 40 MG tablet Take 40 mg by mouth at bedtime.     . B Complex-C-Folic Acid (DIALYVITE 403) 0.8 MG TABS Take 1 tablet  by mouth every evening.  4  . Besifloxacin HCl (BESIVANCE) 0.6 % SUSP Place 1 drop into the left eye 3 (three) times daily.    . Bromfenac Sodium (PROLENSA) 0.07 % SOLN Place 1 drop into the left eye at bedtime.    . calcitRIOL (ROCALTROL) 0.25 MCG capsule Take 0.25 mcg by mouth every Monday, Wednesday, and Friday with hemodialysis.     Marland Kitchen carvedilol (COREG) 6.25 MG tablet Take 6.25 mg by mouth 2 (two) times daily with a meal.    . Difluprednate (DUREZOL) 0.05 % EMUL Place 1 drop into the left eye 3 (three) times daily.    Marland Kitchen esomeprazole (NEXIUM) 40 MG capsule Take 40 mg by mouth daily before breakfast.     . ferric citrate (AURYXIA) 1 GM 210 MG(Fe) tablet Take 210 mg by mouth 3 (three) times daily.    Marland Kitchen FLUoxetine (PROZAC) 20 MG capsule Take 20 mg by mouth daily.     . furosemide (LASIX) 40 MG tablet Take 40 mg by mouth daily.     Marland Kitchen gabapentin (NEURONTIN) 300 MG capsule Take 1 capsule (300 mg total) by mouth 3 (three) times daily. 90 capsule 3  . hydrALAZINE (APRESOLINE) 25 MG tablet Take 25 mg by mouth 2 (two) times daily.    . Insulin Glargine (LANTUS SOLOSTAR)  100 UNIT/ML Solostar Pen Inject 10 Units into the skin 2 (two) times daily.     . insulin lispro (HUMALOG KWIKPEN) 100 UNIT/ML KwikPen Inject 25-32 Units into the skin 3 (three) times daily. Sliding Scale Insulin    . levalbuterol (XOPENEX HFA) 45 MCG/ACT inhaler Inhale 1 puff into the lungs every 4 (four) hours as needed for wheezing.    . levalbuterol (XOPENEX) 0.63 MG/3ML nebulizer solution Inhale 0.63 mg into the lungs every 8 (eight) hours as needed for wheezing or shortness of breath.   1  . lidocaine-prilocaine (EMLA) cream Apply 1 application topically See admin instructions. Apply small amount to access site 1-2 hours prior to dialysis.  12  . lisinopril (PRINIVIL,ZESTRIL) 10 MG tablet Take 10 mg by mouth daily.    . metoprolol tartrate (LOPRESSOR) 25 MG tablet Take by mouth.    . nitroGLYCERIN (NITROSTAT) 0.4 MG SL tablet Place  0.4 mg under the tongue every 5 (five) minutes as needed for chest pain.    Marland Kitchen oxycodone (ROXICODONE) 30 MG immediate release tablet Take 30 mg by mouth every 8 (eight) hours as needed for pain.     Marland Kitchen oxyCODONE-acetaminophen (PERCOCET) 7.5-325 MG tablet Take 1 tablet by mouth every 6 (six) hours as needed for severe pain.     . sevelamer carbonate (RENVELA) 800 MG tablet Take by mouth.    . tiotropium (SPIRIVA) 18 MCG inhalation capsule Place into inhaler and inhale.     No current facility-administered medications for this visit.     On ROS today: 10 system ROS is negative unless otherwise noted in HPI   Physical Examination   Vitals:   04/05/18 1620  BP: (!) 142/75  Pulse: 60  Resp: 16  SpO2: 98%  Weight: 184 lb 5.8 oz (83.6 kg)  Height: 5' 11.5" (1.816 m)   Body mass index is 25.35 kg/m.  General Alert, O x 3, WD, NAD  Pulmonary Sym exp, good B air movt, CTA B  Cardiac RRR, Nl S1, S2,   Vascular Vessel Right Left  Radial Palpable Palpable  Brachial Palpable Palpable  Aorta Not palpable N/A  Femoral Palpable Palpable  Popliteal Not palpable Not palpable  PT Brisk by Doppler Faint by Doppler  DP Brisk by Doppler ATA brisk by Doppler    Gastro- intestinal soft, non-distended, non-tender to palpation,   Musculo- skeletal M/S 5/5 throughout  , Left great toe ulceration with clean borders, no purulence or drainage with manipulation; palpable thrill and audible bruit through right radiocephalic fistula; some numbness in right thumb and index finger; grip strength intact  Neurologic Cranial nerves 2-12 intact ,     Medical Decision Making   Jose Sellers is a 58 y.o. male who presents for postop right radiocephalic fistula check as well as to evaluate PAD with left great toe ulceration   Based on physical exam and fistula duplex, right radiocephalic fistula will be ready for cannulation on 05/08/2018  Tunneled dialysis catheter can be removed when nephrology is  comfortable with the performance of the fistula  Patient also has a story of claudication with left great toe ulceration after nail clipping  Check ABIs next available  We will also schedule aortogram with runoff and possible intervention with Dr. Oneida Alar on 04/20/2018  Risks, benefits, and alternatives were discussed with the patient and he agrees to proceed  Continue aspirin and statin daily  Encouraged smoking cessation   Dagoberto Ligas PA-C [facility] Office: [phone]

## 2018-04-05 NOTE — Progress Notes (Signed)
Established Critical Limb Ischemia Patient   History of Present Illness   Jose Sellers is a 58 y.o. year old male who presents for postoperative follow-up for: right radiocephalic arteriovenous fistula by Dr. Oneida Alar (Date: 02/13/18).  The patient's wounds are well healed.  The patient denies steal symptoms.  The patient is able to complete their activities of daily living.  He is dialyzing from R [facility] under the care of Dr. Justin Mend on a MWF schedule at [facility] kidney center.  Today he also complains of pain in his feet and legs with ambulation.  PMH also significant for IDDM and peripheral neuropathy.  He says he is concerned that symptoms are related to circulation because he has a family history of poor circulation.  Patient states he had his left great toenail clipped about a month and a half ago.  Since that time he has had a small sore that has progressively gotten larger.  He has seen [facility] foot and ankle for this however has not had any surgical intervention or any mention of osteomyelitis.  Patient states he is able to walk about 100 feet before having to rest due to cramping in his left more than right calf.  He is an occasional smoker.  He has never had any work done on his lower extremity circulation nor has he had any imaging.  He is taking aspirin and statin daily.  Current Outpatient Medications  Medication Sig Dispense Refill  . acetaminophen-codeine (TYLENOL #3) 300-30 MG tablet Take 1-2 tablets by mouth every 6 (six) hours as needed for severe pain. 6 tablet 0  . amiodarone (PACERONE) 200 MG tablet Take 200 mg by mouth daily.    Marland Kitchen amLODipine (NORVASC) 5 MG tablet Take 10 mg by mouth daily.     Marland Kitchen apixaban (ELIQUIS) 2.5 MG TABS tablet Take 2.5 mg by mouth 2 (two) times daily.     Marland Kitchen aspirin 81 MG tablet Take 81 mg by mouth daily.     Marland Kitchen atorvastatin (LIPITOR) 40 MG tablet Take 40 mg by mouth at bedtime.     . B Complex-C-Folic Acid (DIALYVITE 169) 0.8 MG TABS Take 1 tablet  by mouth every evening.  4  . Besifloxacin HCl (BESIVANCE) 0.6 % SUSP Place 1 drop into the left eye 3 (three) times daily.    . Bromfenac Sodium (PROLENSA) 0.07 % SOLN Place 1 drop into the left eye at bedtime.    . calcitRIOL (ROCALTROL) 0.25 MCG capsule Take 0.25 mcg by mouth every Monday, Wednesday, and Friday with hemodialysis.     Marland Kitchen carvedilol (COREG) 6.25 MG tablet Take 6.25 mg by mouth 2 (two) times daily with a meal.    . Difluprednate (DUREZOL) 0.05 % EMUL Place 1 drop into the left eye 3 (three) times daily.    Marland Kitchen esomeprazole (NEXIUM) 40 MG capsule Take 40 mg by mouth daily before breakfast.     . ferric citrate (AURYXIA) 1 GM 210 MG(Fe) tablet Take 210 mg by mouth 3 (three) times daily.    Marland Kitchen FLUoxetine (PROZAC) 20 MG capsule Take 20 mg by mouth daily.     . furosemide (LASIX) 40 MG tablet Take 40 mg by mouth daily.     Marland Kitchen gabapentin (NEURONTIN) 300 MG capsule Take 1 capsule (300 mg total) by mouth 3 (three) times daily. 90 capsule 3  . hydrALAZINE (APRESOLINE) 25 MG tablet Take 25 mg by mouth 2 (two) times daily.    . Insulin Glargine (LANTUS SOLOSTAR)  100 UNIT/ML Solostar Pen Inject 10 Units into the skin 2 (two) times daily.     . insulin lispro (HUMALOG KWIKPEN) 100 UNIT/ML KwikPen Inject 25-32 Units into the skin 3 (three) times daily. Sliding Scale Insulin    . levalbuterol (XOPENEX HFA) 45 MCG/ACT inhaler Inhale 1 puff into the lungs every 4 (four) hours as needed for wheezing.    . levalbuterol (XOPENEX) 0.63 MG/3ML nebulizer solution Inhale 0.63 mg into the lungs every 8 (eight) hours as needed for wheezing or shortness of breath.   1  . lidocaine-prilocaine (EMLA) cream Apply 1 application topically See admin instructions. Apply small amount to access site 1-2 hours prior to dialysis.  12  . lisinopril (PRINIVIL,ZESTRIL) 10 MG tablet Take 10 mg by mouth daily.    . metoprolol tartrate (LOPRESSOR) 25 MG tablet Take by mouth.    . nitroGLYCERIN (NITROSTAT) 0.4 MG SL tablet Place  0.4 mg under the tongue every 5 (five) minutes as needed for chest pain.    Marland Kitchen oxycodone (ROXICODONE) 30 MG immediate release tablet Take 30 mg by mouth every 8 (eight) hours as needed for pain.     Marland Kitchen oxyCODONE-acetaminophen (PERCOCET) 7.5-325 MG tablet Take 1 tablet by mouth every 6 (six) hours as needed for severe pain.     . sevelamer carbonate (RENVELA) 800 MG tablet Take by mouth.    . tiotropium (SPIRIVA) 18 MCG inhalation capsule Place into inhaler and inhale.     No current facility-administered medications for this visit.     On ROS today: 10 system ROS is negative unless otherwise noted in HPI   Physical Examination   Vitals:   04/05/18 1620  BP: (!) 142/75  Pulse: 60  Resp: 16  SpO2: 98%  Weight: 184 lb 5.8 oz (83.6 kg)  Height: 5' 11.5" (1.816 m)   Body mass index is 25.35 kg/m.  General Alert, O x 3, WD, NAD  Pulmonary Sym exp, good B air movt, CTA B  Cardiac RRR, Nl S1, S2,   Vascular Vessel Right Left  Radial Palpable Palpable  Brachial Palpable Palpable  Aorta Not palpable N/A  Femoral Palpable Palpable  Popliteal Not palpable Not palpable  PT Brisk by Doppler Faint by Doppler  DP Brisk by Doppler ATA brisk by Doppler    Gastro- intestinal soft, non-distended, non-tender to palpation,   Musculo- skeletal M/S 5/5 throughout  , Left great toe ulceration with clean borders, no purulence or drainage with manipulation; palpable thrill and audible bruit through right radiocephalic fistula; some numbness in right thumb and index finger; grip strength intact  Neurologic Cranial nerves 2-12 intact ,     Medical Decision Making   Jose Sellers is a 58 y.o. male who presents for postop right radiocephalic fistula check as well as to evaluate PAD with left great toe ulceration   Based on physical exam and fistula duplex, right radiocephalic fistula will be ready for cannulation on 05/08/2018  Tunneled dialysis catheter can be removed when nephrology is  comfortable with the performance of the fistula  Patient also has a story of claudication with left great toe ulceration after nail clipping  Check ABIs next available  We will also schedule aortogram with runoff and possible intervention with Dr. Oneida Alar on 04/20/2018  Risks, benefits, and alternatives were discussed with the patient and he agrees to proceed  Continue aspirin and statin daily  Encouraged smoking cessation   Dagoberto Ligas PA-C [facility] [place] Office: [phone]

## 2018-04-05 NOTE — H&P (View-Only) (Signed)
Established Critical Limb Ischemia Patient   History of Present Illness   Jose Sellers is a 58 y.o. year old male who presents for postoperative follow-up for: right radiocephalic arteriovenous fistula by Dr. Oneida Alar (Date: 02/13/18).  The patient's wounds are well healed.  The patient denies steal symptoms.  The patient is able to complete their activities of daily living.  He is dialyzing from R IJ [facility] under the care of Dr. Justin Mend on a MWF schedule at [facility].  Today he also complains of pain in his feet and legs with ambulation.  PMH also significant for IDDM and peripheral neuropathy.  He says he is concerned that symptoms are related to circulation because he has a family history of poor circulation.  Patient states he had his left great toenail clipped about a month and a half ago.  Since that time he has had a small sore that has progressively gotten larger.  He has seen [facility] for this however has not had any surgical intervention or any mention of osteomyelitis.  Patient states he is able to walk about 100 feet before having to rest due to cramping in his left more than right calf.  He is an occasional smoker.  He has never had any work done on his lower extremity circulation nor has he had any imaging.  He is taking aspirin and statin daily.  Current Outpatient Medications  Medication Sig Dispense Refill  . acetaminophen-codeine (TYLENOL #3) 300-30 MG tablet Take 1-2 tablets by mouth every 6 (six) hours as needed for severe pain. 6 tablet 0  . amiodarone (PACERONE) 200 MG tablet Take 200 mg by mouth daily.    Marland Kitchen amLODipine (NORVASC) 5 MG tablet Take 10 mg by mouth daily.     Marland Kitchen apixaban (ELIQUIS) 2.5 MG TABS tablet Take 2.5 mg by mouth 2 (two) times daily.     Marland Kitchen aspirin 81 MG tablet Take 81 mg by mouth daily.     Marland Kitchen atorvastatin (LIPITOR) 40 MG tablet Take 40 mg by mouth at bedtime.     . B Complex-C-Folic Acid (DIALYVITE 076) 0.8 MG TABS Take 1 tablet  by mouth every evening.  4  . Besifloxacin HCl (BESIVANCE) 0.6 % SUSP Place 1 drop into the left eye 3 (three) times daily.    . Bromfenac Sodium (PROLENSA) 0.07 % SOLN Place 1 drop into the left eye at bedtime.    . calcitRIOL (ROCALTROL) 0.25 MCG capsule Take 0.25 mcg by mouth every Monday, Wednesday, and Friday with hemodialysis.     Marland Kitchen carvedilol (COREG) 6.25 MG tablet Take 6.25 mg by mouth 2 (two) times daily with a meal.    . Difluprednate (DUREZOL) 0.05 % EMUL Place 1 drop into the left eye 3 (three) times daily.    Marland Kitchen esomeprazole (NEXIUM) 40 MG capsule Take 40 mg by mouth daily before breakfast.     . ferric citrate (AURYXIA) 1 GM 210 MG(Fe) tablet Take 210 mg by mouth 3 (three) times daily.    Marland Kitchen FLUoxetine (PROZAC) 20 MG capsule Take 20 mg by mouth daily.     . furosemide (LASIX) 40 MG tablet Take 40 mg by mouth daily.     Marland Kitchen gabapentin (NEURONTIN) 300 MG capsule Take 1 capsule (300 mg total) by mouth 3 (three) times daily. 90 capsule 3  . hydrALAZINE (APRESOLINE) 25 MG tablet Take 25 mg by mouth 2 (two) times daily.    . Insulin Glargine (LANTUS SOLOSTAR)  100 UNIT/ML Solostar Pen Inject 10 Units into the skin 2 (two) times daily.     . insulin lispro (HUMALOG KWIKPEN) 100 UNIT/ML KwikPen Inject 25-32 Units into the skin 3 (three) times daily. Sliding Scale Insulin    . levalbuterol (XOPENEX HFA) 45 MCG/ACT inhaler Inhale 1 puff into the lungs every 4 (four) hours as needed for wheezing.    . levalbuterol (XOPENEX) 0.63 MG/3ML nebulizer solution Inhale 0.63 mg into the lungs every 8 (eight) hours as needed for wheezing or shortness of breath.   1  . lidocaine-prilocaine (EMLA) cream Apply 1 application topically See admin instructions. Apply small amount to access site 1-2 hours prior to dialysis.  12  . lisinopril (PRINIVIL,ZESTRIL) 10 MG tablet Take 10 mg by mouth daily.    . metoprolol tartrate (LOPRESSOR) 25 MG tablet Take by mouth.    . nitroGLYCERIN (NITROSTAT) 0.4 MG SL tablet Place  0.4 mg under the tongue every 5 (five) minutes as needed for chest pain.    Marland Kitchen oxycodone (ROXICODONE) 30 MG immediate release tablet Take 30 mg by mouth every 8 (eight) hours as needed for pain.     Marland Kitchen oxyCODONE-acetaminophen (PERCOCET) 7.5-325 MG tablet Take 1 tablet by mouth every 6 (six) hours as needed for severe pain.     . sevelamer carbonate (RENVELA) 800 MG tablet Take by mouth.    . tiotropium (SPIRIVA) 18 MCG inhalation capsule Place into inhaler and inhale.     No current facility-administered medications for this visit.     On ROS today: 10 system ROS is negative unless otherwise noted in HPI   Physical Examination   Vitals:   04/05/18 1620  BP: (!) 142/75  Pulse: 60  Resp: 16  SpO2: 98%  Weight: 184 lb 5.8 oz (83.6 kg)  Height: 5' 11.5" (1.816 m)   Body mass index is 25.35 kg/m.  General Alert, O x 3, WD, NAD  Pulmonary Sym exp, good B air movt, CTA B  Cardiac RRR, Nl S1, S2,   Vascular Vessel Right Left  Radial Palpable Palpable  Brachial Palpable Palpable  Aorta Not palpable N/A  Femoral Palpable Palpable  Popliteal Not palpable Not palpable  PT Brisk by Doppler Faint by Doppler  DP Brisk by Doppler ATA brisk by Doppler    Gastro- intestinal soft, non-distended, non-tender to palpation,   Musculo- skeletal M/S 5/5 throughout  , Left great toe ulceration with clean borders, no purulence or drainage with manipulation; palpable thrill and audible bruit through right radiocephalic fistula; some numbness in right thumb and index finger; grip strength intact  Neurologic Cranial nerves 2-12 intact ,     Medical Decision Making   Jose Sellers is a 58 y.o. male who presents for postop right radiocephalic fistula check as well as to evaluate PAD with left great toe ulceration   Based on physical exam and fistula duplex, right radiocephalic fistula will be ready for cannulation on 05/08/2018  Tunneled dialysis catheter can be removed when nephrology is  comfortable with the performance of the fistula  Patient also has a story of claudication with left great toe ulceration after nail clipping  Check ABIs next available  We will also schedule aortogram with runoff and possible intervention with Dr. Oneida Alar on 04/20/2018  Risks, benefits, and alternatives were discussed with the patient and he agrees to proceed  Continue aspirin and statin daily  Encouraged smoking cessation   Dagoberto Ligas PA-C [facility] [place] Office: [phone]

## 2018-04-06 ENCOUNTER — Other Ambulatory Visit: Payer: Self-pay | Admitting: *Deleted

## 2018-04-09 ENCOUNTER — Other Ambulatory Visit: Payer: Self-pay

## 2018-04-12 ENCOUNTER — Encounter: Payer: Self-pay | Admitting: Sports Medicine

## 2018-04-12 ENCOUNTER — Ambulatory Visit: Payer: Medicare Other | Admitting: Sports Medicine

## 2018-04-12 ENCOUNTER — Ambulatory Visit (HOSPITAL_COMMUNITY)
Admission: RE | Admit: 2018-04-12 | Discharge: 2018-04-12 | Disposition: A | Discharge status: Home / Self Care | Payer: Medicare Other | Source: Ambulatory Visit | Attending: Physician Assistant | Admitting: Physician Assistant

## 2018-04-12 VITALS — BP 160/91 | HR 61 | Temp 97.9°F | Resp 16

## 2018-04-12 DIAGNOSIS — E1142 Type 2 diabetes mellitus with diabetic polyneuropathy: Secondary | ICD-10-CM | POA: Diagnosis not present

## 2018-04-12 DIAGNOSIS — I739 Peripheral vascular disease, unspecified: Secondary | ICD-10-CM | POA: Diagnosis not present

## 2018-04-12 DIAGNOSIS — L97522 Non-pressure chronic ulcer of other part of left foot with fat layer exposed: Secondary | ICD-10-CM | POA: Diagnosis not present

## 2018-04-12 DIAGNOSIS — N186 End stage renal disease: Secondary | ICD-10-CM | POA: Diagnosis not present

## 2018-04-12 NOTE — Progress Notes (Signed)
Subjective: Jose Sellers is a 58 y.o. male patient with history of diabetes who presents to office today for evaluation of left great toenail and wound at toe states that he has been soaking but this sore is still not healing up states that the pain is 6 out of 10 has been using Neosporin and Band-Aid after soaking with soapy water.  Patient reports that he has seen Dr. Oneida Alar and in 2 weeks will be undergoing a balloon angioplasty procedure that should be able to help the toe heal.  Patient admits light redness swelling and yellow drainage but denies nausea vomiting fever chills warmth or any purulence to the site.  Patient is diabetic blood sugar this morning 126 and patient is also on dialysis.  Patient Active Problem List   Diagnosis Date Noted  . Typical atrial flutter ([place]) 10/16/2017  . Acute respiratory failure with hypoxia ([place]) 09/08/2017  . C. difficile diarrhea 09/08/2017  . Peritonitis associated with peritoneal dialysis ([place]) 08/16/2017  . At high risk for falls 07/27/2017  . Arteriosclerotic vascular disease 03/16/2017  . Chronic heart disease 03/16/2017  . End-stage renal disease ([place]) 03/16/2017  . Hypercholesterolemia 03/16/2017  . Hypertensive disorder 03/16/2017  . Anemia of unknown etiology 07/07/2016  . Malaise and fatigue 07/07/2016  . Chronic pain syndrome 06/20/2016  . Secondary hyperparathyroidism ([place]) 12/16/2015  . Stage 5 chronic kidney disease ([place]) 12/16/2015  . Chronic systolic congestive heart failure ([place]) 11/25/2015  . Tobacco use 11/25/2015  . Ventral hernia without obstruction or gangrene 10/26/2015  . Psychosexual dysfunction with inhibited sexual excitement 08/27/2015  . Anxiety 08/26/2015  . COPD (chronic obstructive pulmonary disease) ([place]) 08/26/2015  . DDD (degenerative disc disease), lumbosacral 08/26/2015  . GERD (gastroesophageal reflux disease) 08/26/2015  . Hypothyroidism 08/26/2015  . Microalbuminuria 08/26/2015  . Mixed hyperlipidemia  08/26/2015  . Osteoarthritis of multiple joints 08/26/2015  . Peripheral vascular disease ([place]) 08/26/2015  . Abdominal hernia 06/16/2015  . CAD (coronary artery disease) 04/07/2015  . 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 OSA (obstructive sleep apnea) 04/07/2015  . Essential hypertension 03/06/2015  . Retinopathy, diabetic, bilateral ([place]) 03/06/2015  . Uncontrolled type 2 diabetes mellitus with diabetic polyneuropathy, with long-term current use of insulin ([place]) 03/06/2015  . Atherosclerosis with claudication of extremity ([place]) 08/01/2014  . Mild episode of recurrent major depressive disorder ([place]) 05/01/2013  . Panic disorder 05/01/2013   Current Outpatient Medications on File Prior to Visit  Medication Sig Dispense Refill  . acetaminophen-codeine (TYLENOL #3) 300-30 MG tablet Take 1-2 tablets by mouth every 6 (six) hours as needed for severe pain. 6 tablet 0  . amiodarone (PACERONE) 200 MG tablet Take 200 mg by mouth daily.    Marland Kitchen amLODipine (NORVASC) 5 MG tablet Take 10 mg by mouth daily.     Marland Kitchen apixaban (ELIQUIS) 2.5 MG TABS tablet Take 2.5 mg by mouth 2 (two) times daily.     Marland Kitchen aspirin 81 MG tablet Take 81 mg by mouth daily.     Marland Kitchen atorvastatin (LIPITOR) 40 MG tablet Take 40 mg by mouth at bedtime.     . B Complex-C-Folic Acid (DIALYVITE 354) 0.8 MG TABS Take 1 tablet by mouth every evening.  4  . Besifloxacin HCl (BESIVANCE) 0.6 % SUSP Place 1 drop into the left eye 3 (three) times daily.    . Bromfenac Sodium (PROLENSA) 0.07 % SOLN Place 1 drop into the left eye at bedtime.    . calcitRIOL (ROCALTROL) 0.25 MCG capsule Take  0.25 mcg by mouth every Monday, Wednesday, and Friday with hemodialysis.     Marland Kitchen carvedilol (COREG) 6.25 MG tablet Take 6.25 mg by mouth 2 (two) times daily with a meal.    . Difluprednate (DUREZOL) 0.05 % EMUL Place 1 drop into the left eye 3 (three) times daily.    Marland Kitchen esomeprazole (NEXIUM) 40 MG capsule Take 40 mg by mouth daily before  breakfast.     . ferric citrate (AURYXIA) 1 GM 210 MG(Fe) tablet Take 210 mg by mouth 3 (three) times daily.    Marland Kitchen FLUoxetine (PROZAC) 20 MG capsule Take 20 mg by mouth daily.     . furosemide (LASIX) 40 MG tablet Take 40 mg by mouth daily.     Marland Kitchen gabapentin (NEURONTIN) 300 MG capsule Take 1 capsule (300 mg total) by mouth 3 (three) times daily. 90 capsule 3  . hydrALAZINE (APRESOLINE) 25 MG tablet Take 25 mg by mouth 2 (two) times daily.    . Insulin Glargine (LANTUS SOLOSTAR) 100 UNIT/ML Solostar Pen Inject 10 Units into the skin 2 (two) times daily.     . insulin lispro (HUMALOG KWIKPEN) 100 UNIT/ML KwikPen Inject 25-32 Units into the skin 3 (three) times daily. Sliding Scale Insulin    . levalbuterol (XOPENEX HFA) 45 MCG/ACT inhaler Inhale 1 puff into the lungs every 4 (four) hours as needed for wheezing.    . levalbuterol (XOPENEX) 0.63 MG/3ML nebulizer solution Inhale 0.63 mg into the lungs every 8 (eight) hours as needed for wheezing or shortness of breath.   1  . lidocaine-prilocaine (EMLA) cream Apply 1 application topically See admin instructions. Apply small amount to access site 1-2 hours prior to dialysis.  12  . lisinopril (PRINIVIL,ZESTRIL) 10 MG tablet Take 10 mg by mouth daily.    . metoprolol tartrate (LOPRESSOR) 25 MG tablet Take by mouth.    . nitroGLYCERIN (NITROSTAT) 0.4 MG SL tablet Place 0.4 mg under the tongue every 5 (five) minutes as needed for chest pain.    Marland Kitchen oxycodone (ROXICODONE) 30 MG immediate release tablet Take 30 mg by mouth every 8 (eight) hours as needed for pain.     Marland Kitchen oxyCODONE-acetaminophen (PERCOCET) 7.5-325 MG tablet Take 1 tablet by mouth every 6 (six) hours as needed for severe pain.     . sevelamer carbonate (RENVELA) 800 MG tablet Take by mouth.    . tiotropium (SPIRIVA) 18 MCG inhalation capsule Place into inhaler and inhale.     No current facility-administered medications on file prior to visit.    Allergies  Allergen Reactions  . Albuterol  Palpitations    SVT  . Prednisone Shortness Of Breath    Wife stated that patient develops breathing issues.  . Fluticasone Furoate-Vilanterol Palpitations  . Morphine Rash    Recent Results (from the past 2160 hour(s))  I-STAT 4, (NA,K, GLUC, HGB,HCT)     Status: Abnormal   Collection Time: 02/13/18  7:49 AM  Result Value Ref Range   Sodium 132 (L) 135 - 145 mmol/L   Potassium 4.0 3.5 - 5.1 mmol/L   Glucose, Bld 123 (H) 70 - 99 mg/dL   HCT 33.0 (L) 39.0 - 52.0 %   Hemoglobin 11.2 (L) 13.0 - 17.0 g/dL  Protime-INR     Status: None   Collection Time: 02/13/18  7:53 AM  Result Value Ref Range   Prothrombin Time 13.9 11.4 - 15.2 seconds   INR 1.08     Comment: Performed at [facility], [address] [place], [place] [address]  Glucose, capillary     Status: None   Collection Time: 02/13/18 11:15 AM  Result Value Ref Range   Glucose-Capillary 98 70 - 99 mg/dL   Comment 1 Notify RN     Objective: General: Patient is awake, alert, and oriented x 3 and in no acute distress.  Integument: Skin is warm, dry and supple bilateral.  Left hallux nailbed is clean dry and intact however to the distal tuft and medial aspect of the left great toe there is a fibrotic ulceration that measures 3 x 5 cm, with mild swelling, blanchable erythema, no active drainage, no probe to bone, no other signs of infection.   Vasculature:  Dorsalis Pedis pulse 1/4 bilateral. Posterior Tibial pulse  0/4 bilateral. Diminished from before. Capillary fill time <5 sec 1-5 bilateral.  Decreased hair growth to the level of the digits. Temperature gradient within normal limits. No varicosities present bilateral.   Neurology: The patient has diminished sensation measured with a 5.07/10g Semmes Weinstein Monofilament at all pedal sites bilateral . Vibratory sensation diminished bilateral with tuning fork. No Babinski sign present bilateral.   Musculoskeletal: Mild pain to palpation to left great toe. No  tenderness with calf compression bilateral.  Assessment and Plan: Problem List Items Addressed This Visit      Genitourinary   End-stage renal disease ([place])    Other Visit Diagnoses    Toe ulcer, left, with fat layer exposed ([place])    -  Primary   PAD (peripheral artery disease) ([place])       Diabetic polyneuropathy associated with type 2 diabetes mellitus ([place])         -Examined patient. -Discussed with patient treatment options for fibrotic nonhealing wound -Applied a small amount of Betadine and a dry bandage to the left great toe and advised patient to do the same thing until he gets the Santyl cream that I have ordered -Continue with close vascular follow-up and angioplasty procedure that is already scheduled -Advised patient to avoid from wearing shoes that rub or irritate the toe and avoid from bumping the toe -Advised patient to monitor for worsening symptoms if present to return to office or to go to ER immediately -Patient to return in 2-3 weeks for toe check after his vascular procedure -Patient advised to call the office if any problems or questions arise in the meantime.  Landis Martins, DPM

## 2018-04-20 ENCOUNTER — Other Ambulatory Visit: Payer: Self-pay | Admitting: Vascular Surgery

## 2018-04-20 ENCOUNTER — Telehealth: Payer: Self-pay | Admitting: *Deleted

## 2018-04-20 ENCOUNTER — Other Ambulatory Visit: Payer: Self-pay

## 2018-04-20 ENCOUNTER — Ambulatory Visit (HOSPITAL_COMMUNITY)
Admission: RE | Admit: 2018-04-20 | Discharge: 2018-04-20 | Disposition: A | Discharge status: Home / Self Care | Payer: Medicare Other | Attending: Vascular Surgery | Admitting: Vascular Surgery

## 2018-04-20 ENCOUNTER — Encounter (HOSPITAL_COMMUNITY)
Admission: RE | Disposition: A | Discharge status: Home / Self Care | Payer: Self-pay | Source: Home / Self Care | Attending: Vascular Surgery

## 2018-04-20 ENCOUNTER — Encounter (HOSPITAL_COMMUNITY): Payer: Self-pay | Admitting: Vascular Surgery

## 2018-04-20 ENCOUNTER — Encounter: Payer: Self-pay | Admitting: *Deleted

## 2018-04-20 DIAGNOSIS — Z79899 Other long term (current) drug therapy: Secondary | ICD-10-CM | POA: Diagnosis not present

## 2018-04-20 DIAGNOSIS — F17200 Nicotine dependence, unspecified, uncomplicated: Secondary | ICD-10-CM | POA: Insufficient documentation

## 2018-04-20 DIAGNOSIS — E11621 Type 2 diabetes mellitus with foot ulcer: Secondary | ICD-10-CM | POA: Diagnosis not present

## 2018-04-20 DIAGNOSIS — I70219 Atherosclerosis of native arteries of extremities with intermittent claudication, unspecified extremity: Secondary | ICD-10-CM

## 2018-04-20 DIAGNOSIS — E1151 Type 2 diabetes mellitus with diabetic peripheral angiopathy without gangrene: Secondary | ICD-10-CM | POA: Insufficient documentation

## 2018-04-20 DIAGNOSIS — Z992 Dependence on renal dialysis: Secondary | ICD-10-CM | POA: Diagnosis not present

## 2018-04-20 DIAGNOSIS — Z7982 Long term (current) use of aspirin: Secondary | ICD-10-CM | POA: Insufficient documentation

## 2018-04-20 DIAGNOSIS — L97529 Non-pressure chronic ulcer of other part of left foot with unspecified severity: Secondary | ICD-10-CM | POA: Insufficient documentation

## 2018-04-20 DIAGNOSIS — Z95810 Presence of automatic (implantable) cardiac defibrillator: Secondary | ICD-10-CM | POA: Insufficient documentation

## 2018-04-20 DIAGNOSIS — Z794 Long term (current) use of insulin: Secondary | ICD-10-CM | POA: Insufficient documentation

## 2018-04-20 DIAGNOSIS — Z7901 Long term (current) use of anticoagulants: Secondary | ICD-10-CM | POA: Insufficient documentation

## 2018-04-20 HISTORY — PX: ABDOMINAL AORTOGRAM W/LOWER EXTREMITY: CATH118223

## 2018-04-20 LAB — POCT I-STAT CREATININE: Creatinine, Ser: 6.1 mg/dL — ABNORMAL HIGH (ref 0.61–1.24)

## 2018-04-20 LAB — POCT I-STAT 4, (NA,K, GLUC, HGB,HCT)
Glucose, Bld: 116 mg/dL — ABNORMAL HIGH (ref 70–99)
HCT: 39 % (ref 39.0–52.0)
Hemoglobin: 13.3 g/dL (ref 13.0–17.0)
Potassium: 5 mmol/L (ref 3.5–5.1)
Sodium: 133 mmol/L — ABNORMAL LOW (ref 135–145)

## 2018-04-20 LAB — GLUCOSE, CAPILLARY: Glucose-Capillary: 109 mg/dL — ABNORMAL HIGH (ref 70–99)

## 2018-04-20 SURGERY — ABDOMINAL AORTOGRAM W/LOWER EXTREMITY
Anesthesia: LOCAL

## 2018-04-20 MED ORDER — HYDRALAZINE HCL 20 MG/ML IJ SOLN: 5.0000 mg | INTRAMUSCULAR | Status: DC | PRN

## 2018-04-20 MED ORDER — LIDOCAINE HCL (PF) 1 % IJ SOLN
INTRAMUSCULAR | Status: DC | PRN
  Administered 2018-04-20: 20 mL via INTRADERMAL

## 2018-04-20 MED ORDER — MORPHINE SULFATE (PF) 10 MG/ML IV SOLN: 2.0000 mg | INTRAVENOUS | Status: DC | PRN

## 2018-04-20 MED ORDER — ACETAMINOPHEN 325 MG PO TABS: 650.0000 mg | ORAL_TABLET | ORAL | Status: DC | PRN

## 2018-04-20 MED ORDER — LABETALOL HCL 5 MG/ML IV SOLN: 10.0000 mg | INTRAVENOUS | Status: DC | PRN

## 2018-04-20 MED ORDER — SODIUM CHLORIDE 0.9% FLUSH: 3.0000 mL | Freq: Two times a day (BID) | INTRAVENOUS | Status: DC

## 2018-04-20 MED ORDER — HEPARIN (PORCINE) IN NACL 1000-0.9 UT/500ML-% IV SOLN
INTRAVENOUS | Status: DC | PRN
  Administered 2018-04-20 (×2): 500 mL

## 2018-04-20 MED ORDER — SODIUM CHLORIDE 0.9 % IV SOLN: 250.0000 mL | INTRAVENOUS | Status: DC | PRN

## 2018-04-20 MED ORDER — HEPARIN (PORCINE) IN NACL 1000-0.9 UT/500ML-% IV SOLN
INTRAVENOUS | Status: AC
  Filled 2018-04-20: qty 1000

## 2018-04-20 MED ORDER — IODIXANOL 320 MG/ML IV SOLN
INTRAVENOUS | Status: DC | PRN
  Administered 2018-04-20: 160 mL via INTRA_ARTERIAL

## 2018-04-20 MED ORDER — ONDANSETRON HCL 4 MG/2ML IJ SOLN: 4.0000 mg | Freq: Four times a day (QID) | INTRAMUSCULAR | Status: DC | PRN

## 2018-04-20 MED ORDER — OXYCODONE HCL 5 MG PO TABS: 5.0000 mg | ORAL_TABLET | ORAL | Status: DC | PRN

## 2018-04-20 MED ORDER — SODIUM CHLORIDE 0.9% FLUSH: 3.0000 mL | INTRAVENOUS | Status: DC | PRN

## 2018-04-20 MED ORDER — LIDOCAINE HCL (PF) 1 % IJ SOLN
INTRAMUSCULAR | Status: AC
  Filled 2018-04-20: qty 30

## 2018-04-20 SURGICAL SUPPLY — 9 items
CATH ANGIO 5F PIGTAIL 65CM (CATHETERS) ×2 IMPLANT
KIT PV (KITS) ×2 IMPLANT
SHEATH PINNACLE 5F 10CM (SHEATH) ×2 IMPLANT
SHEATH PROBE COVER 6X72 (BAG) ×2 IMPLANT
SYR MEDRAD MARK V 150ML (SYRINGE) ×2 IMPLANT
TRANSDUCER W/STOPCOCK (MISCELLANEOUS) ×2 IMPLANT
TRAY PV CATH (CUSTOM PROCEDURE TRAY) ×2 IMPLANT
WIRE BENTSON .035X145CM (WIRE) ×2 IMPLANT
WIRE HITORQ VERSACORE ST 145CM (WIRE) ×2 IMPLANT

## 2018-04-20 NOTE — CV Procedure (Signed)
   5 Fr sheat was pulled from the R F/A, and manual pressure was held for 20 min.R groin area is soft and non tender.  Instructions were given to patient about his mobility restrictions and bed rest. Bed rest started at 1315 x 4 hr. BP - 166/80 HR - 60 SR sPO2 - 95 5 on N/C @ 1 L/min.

## 2018-04-20 NOTE — Interval H&P Note (Signed)
History and Physical Interval Note:  04/20/2018 10:44 AM  Jose Sellers  has presented today for surgery, with the diagnosis of PVD  The various methods of treatment have been discussed with the patient and family. After consideration of risks, benefits and other options for treatment, the patient has consented to  Procedure(s): ABDOMINAL AORTOGRAM W/LOWER EXTREMITY (N/A) as a surgical intervention .  The patient's history has been reviewed, patient examined, no change in status, stable for surgery.  I have reviewed the patient's chart and labs.  Questions were answered to the patient's satisfaction.     Ruta Hinds

## 2018-04-20 NOTE — Op Note (Signed)
Procedure: Abdominal aortogram with bilateral lower extremity runoff  Preoperative diagnosis: Nonhealing wound left foot  Postoperative diagnosis: Same  Anesthesia: Local  Operative findings: #1 severe calcification aortoiliac segments  2.  Bilateral greater than 70% common femoral artery stenosis  3.  Bilateral superficial femoral artery occlusions with reconstitution of a very diseased above-knee popliteal but healthier appearing below-knee popliteal artery  4.  One-vessel runoff anterior tibial artery  Operative details: After pain informed consent, patient taken the PV lab.  The patient was placed supine position on the Angie table.  Both groins were prepped and draped in usual sterile fashion.  Local anesthesia was infiltrated over the right common femoral artery.  Ultrasound was used to identify the right common femoral artery and femoral bifurcation.  An introducer needle was used to cannulate the right common femoral artery.  There was dense calcification and atherosclerotic plaque in the Common femoral artery.  Although I had good blood return for the needle I could get the not get the guidewire to advance.  Therefore the needle was removed and hemostasis obtained with direct pressure for about 3 minutes.  Again using ultrasound I cannulated the artery slightly higher.  I was able to get the guidewire to advance from this position.  A 5 French sheath placed over the guidewire into the right common femoral artery.  This was thoroughly flushed with heparinized saline.  A 5 French pigtail catheter was advanced over guidewire in the abdominal aorta and abdominal aortogram was obtained in AP projection.  The infrarenal abdominal aorta is heavily calcified but patent.  The left common and external iliac artery is patent but again heavily calcified.  The left internal iliac artery is occluded.  The right common iliac artery is patent the right internal and external iliac arteries are heavily  calcified but again patent with no flow-limiting stenosis.  Bilateral oblique views of the pelvis were obtained after bringing the pigtail catheter down just above the aortic bifurcation.  This was done with magnification.  This shows bilateral 70% stenosis of the common femoral arteries with calcific plaque.  At this point bilateral lower extremity runoff views were obtained through the pigtail catheter.  In the left lower extremity, the left common femoral artery has a 70% calcified stenosis.  The left superficial femoral artery occludes just after its origin.  The left profunda has a 50% stenosis at its origin and that is patent distally.  The profunda collaterals reconstitute a calcified above-knee popliteal artery the below-knee popliteal artery is a better healthier appearing vessel.  This is patent.  The posterior tibial and peroneal arteries are occluded.  There is one-vessel anterior tibial runoff all the way to the dorsalis pedis to the left foot.  In the right lower extremity, the right common femoral artery has a 70% calcified stenosis.  The right superficial femoral artery is patent proximally but occludes at the adductor hiatus.  The above-knee popliteal artery does reconstitute via collaterals but the skin is calcified and heavily diseased.  The below-knee popliteal artery is a healthy-appearing vessel.  There is one-vessel runoff via the anterior tibial artery to the right foot.  At this point the pigtail catheter was removed over guidewire.  A few additional films were obtained through the sheath on the right side to clarify the tibial pattern on the right side.  At this point the procedure was concluded.  The 5 French sheath was left in place to be pulled in the holding area.  The patient tolerated  procedure well and there were no complications.  Operative management: The patient will be scheduled for a left femoral to below-knee popliteal bypass with left femoral endarterectomy in the  near future.  He currently dialyzes on a Monday Wednesday and Friday.  We will try to look for a Tuesday or Thursday operative date for him.  Ruta Hinds, MD [facility] [place] Office: [phone] Pager: [phone]

## 2018-04-20 NOTE — Discharge Instructions (Signed)

## 2018-04-20 NOTE — Progress Notes (Signed)
No bleeding or hematoma noted after ambulation with patient

## 2018-04-20 NOTE — Telephone Encounter (Signed)
Called and spoke with [place] at [facility]. To reschedule  HD days to accommodate surgery on 05/04/2018.

## 2018-04-26 ENCOUNTER — Telehealth: Payer: Self-pay | Admitting: *Deleted

## 2018-04-26 NOTE — Telephone Encounter (Signed)
Call and spoke with family SALINA who states patient is not taking Eliquis but Aspirin daily.

## 2018-04-26 NOTE — Telephone Encounter (Signed)
Call to office to follow up on Cardiac clearance for surgery. Re faxed to office attention [place].

## 2018-04-27 ENCOUNTER — Encounter: Payer: Self-pay | Admitting: Vascular Surgery

## 2018-04-30 NOTE — Pre-Procedure Instructions (Signed)
Jose Sellers  04/30/2018      [place], [place] [address] Phone: [phone] Fax: [phone]    Your procedure is scheduled on Friday, February 7th.  Report to [facility] Admitting at 5:30 A.M.  Call this number if you have problems the morning of surgery:  [phone]   Remember:  Do not eat or drink after midnight.    Take these medicines the morning of surgery with A SIP OF WATER  amiodarone (PACERONE) Aspirin esomeprazole (NEXIUM)  FLUoxetine (PROZAC)  metoprolol tartrate (LOPRESSOR) oxycodone (ROXICODONE)-if needed for pain oxyCODONE-acetaminophen (PERCOCET)-if needed for severe pain.  tiotropium (SPIRIVA) inhaler levalbuterol (XOPENEX) -if needed for shortness of breath  As of today, STOP taking any Aspirin (unless otherwise instructed by your surgeon), Aleve, Naproxen, Ibuprofen, Motrin, Advil, Goody's, BC's, all herbal medications, fish oil, and all vitamins.   WHAT DO I DO ABOUT MY DIABETES MEDICATION?  . THE NIGHT BEFORE SURGERY, take 5 units of _Insulin Glargine (LANTUS SOLOSTAR_insulin.      . THE MORNING OF SURGERY If your CBG is greater than 220 mg/dL, you may take  of your sliding scale (correction) dose of insulin; insulin lispro (HUMALOG KWIKPEN).     How to Manage Your Diabetes Before and After Surgery  Why is it important to control my blood sugar before and after surgery? . Improving blood sugar levels before and after surgery helps healing and can limit problems. . A way of improving blood sugar control is eating a healthy diet by: o  Eating less sugar and carbohydrates o  Increasing activity/exercise o  Talking with your doctor about reaching your blood sugar goals . High blood sugars (greater than 180 mg/dL) can raise your risk of infections and slow your recovery, so you will need to focus on controlling your diabetes during the weeks before  surgery. . Make sure that the doctor who takes care of your diabetes knows about your planned surgery including the date and location.  How do I manage my blood sugar before surgery? . Check your blood sugar at least 4 times a day, starting 2 days before surgery, to make sure that the level is not too high or low. o Check your blood sugar the morning of your surgery when you wake up and every 2 hours until you get to the Short Stay unit. . If your blood sugar is less than 70 mg/dL, you will need to treat for low blood sugar: o Do not take insulin. o Treat a low blood sugar (less than 70 mg/dL) with  cup of clear juice (cranberry or apple), 4 glucose tablets, OR glucose gel. o Recheck blood sugar in 15 minutes after treatment (to make sure it is greater than 70 mg/dL). If your blood sugar is not greater than 70 mg/dL on recheck, call [phone] for further instructions. . Report your blood sugar to the short stay nurse when you get to Short Stay.  . If you are admitted to the hospital after surgery: o Your blood sugar will be checked by the staff and you will probably be given insulin after surgery (instead of oral diabetes medicines) to make sure you have good blood sugar levels. o The goal for blood sugar control after surgery is 80-180 mg/dL.     Do not wear jewelry.  Do not wear lotions, powders, or colognes, or deodorant.  Men may shave face and neck.  Do not bring valuables to the hospital.  [facility] is not responsible for any belongings or valuables.  Contacts, dentures or bridgework may not be worn into surgery.  Leave your suitcase in the car.  After surgery it may be brought to your room.  For patients admitted to the hospital, discharge time will be determined by your treatment team.  Patients discharged the day of surgery will not be allowed to drive home.   Special instructions:   [facility]- Preparing For Surgery  Before surgery, you can play an important role.  Because skin is not sterile, your skin needs to be as free of germs as possible. You can reduce the number of germs on your skin by washing with CHG (chlorahexidine gluconate) Soap before surgery.  CHG is an antiseptic cleaner which kills germs and bonds with the skin to continue killing germs even after washing.    Oral Hygiene is also important to reduce your risk of infection.  Remember - BRUSH YOUR TEETH THE MORNING OF SURGERY WITH YOUR REGULAR TOOTHPASTE  Please do not use if you have an allergy to CHG or antibacterial soaps. If your skin becomes reddened/irritated stop using the CHG.  Do not shave (including legs and underarms) for at least 48 hours prior to first CHG shower. It is OK to shave your face.  Please follow these instructions carefully.   1. Shower the NIGHT BEFORE SURGERY and the MORNING OF SURGERY with CHG.   2. If you chose to wash your hair, wash your hair first as usual with your normal shampoo.  3. After you shampoo, rinse your hair and body thoroughly to remove the shampoo.  4. Use CHG as you would any other liquid soap. You can apply CHG directly to the skin and wash gently with a scrungie or a clean washcloth.   5. Apply the CHG Soap to your body ONLY FROM THE NECK DOWN.  Do not use on open wounds or open sores. Avoid contact with your eyes, ears, mouth and genitals (private parts). Wash Face and genitals (private parts)  with your normal soap.  6. Wash thoroughly, paying special attention to the area where your surgery will be performed.  7. Thoroughly rinse your body with warm water from the neck down.  8. DO NOT shower/wash with your normal soap after using and rinsing off the CHG Soap.  9. Pat yourself dry with a CLEAN TOWEL.  10. Wear CLEAN PAJAMAS to bed the night before surgery, wear comfortable clothes the morning of surgery  11. Place CLEAN SHEETS on your bed the night of your first shower and DO NOT SLEEP WITH PETS.    Day of Surgery:  Do not  apply any deodorants/lotions.  Please wear clean clothes to the hospital/surgery center.   Remember to brush your teeth WITH YOUR REGULAR TOOTHPASTE.   Please read over the following fact sheets that you were given.

## 2018-05-01 ENCOUNTER — Encounter (HOSPITAL_COMMUNITY)
Admission: RE | Admit: 2018-05-01 | Discharge: 2018-05-01 | Disposition: A | Discharge status: Home / Self Care | Payer: Medicare Other | Source: Ambulatory Visit | Attending: Vascular Surgery | Admitting: Vascular Surgery

## 2018-05-01 ENCOUNTER — Other Ambulatory Visit: Payer: Self-pay

## 2018-05-01 ENCOUNTER — Encounter (HOSPITAL_COMMUNITY): Payer: Self-pay

## 2018-05-01 ENCOUNTER — Encounter: Payer: Self-pay | Admitting: *Deleted

## 2018-05-01 DIAGNOSIS — Z794 Long term (current) use of insulin: Secondary | ICD-10-CM | POA: Insufficient documentation

## 2018-05-01 DIAGNOSIS — Z9981 Dependence on supplemental oxygen: Secondary | ICD-10-CM | POA: Insufficient documentation

## 2018-05-01 DIAGNOSIS — E785 Hyperlipidemia, unspecified: Secondary | ICD-10-CM | POA: Insufficient documentation

## 2018-05-01 DIAGNOSIS — E1122 Type 2 diabetes mellitus with diabetic chronic kidney disease: Secondary | ICD-10-CM

## 2018-05-01 DIAGNOSIS — Z01818 Encounter for other preprocedural examination: Secondary | ICD-10-CM | POA: Insufficient documentation

## 2018-05-01 DIAGNOSIS — J449 Chronic obstructive pulmonary disease, unspecified: Secondary | ICD-10-CM

## 2018-05-01 DIAGNOSIS — E1151 Type 2 diabetes mellitus with diabetic peripheral angiopathy without gangrene: Secondary | ICD-10-CM

## 2018-05-01 DIAGNOSIS — E1149 Type 2 diabetes mellitus with other diabetic neurological complication: Secondary | ICD-10-CM

## 2018-05-01 DIAGNOSIS — F17200 Nicotine dependence, unspecified, uncomplicated: Secondary | ICD-10-CM | POA: Insufficient documentation

## 2018-05-01 DIAGNOSIS — I509 Heart failure, unspecified: Secondary | ICD-10-CM

## 2018-05-01 DIAGNOSIS — I252 Old myocardial infarction: Secondary | ICD-10-CM

## 2018-05-01 DIAGNOSIS — Z7982 Long term (current) use of aspirin: Secondary | ICD-10-CM | POA: Insufficient documentation

## 2018-05-01 DIAGNOSIS — Z95810 Presence of automatic (implantable) cardiac defibrillator: Secondary | ICD-10-CM | POA: Insufficient documentation

## 2018-05-01 DIAGNOSIS — Z79899 Other long term (current) drug therapy: Secondary | ICD-10-CM

## 2018-05-01 DIAGNOSIS — Z8673 Personal history of transient ischemic attack (TIA), and cerebral infarction without residual deficits: Secondary | ICD-10-CM

## 2018-05-01 DIAGNOSIS — I132 Hypertensive heart and chronic kidney disease with heart failure and with stage 5 chronic kidney disease, or end stage renal disease: Secondary | ICD-10-CM | POA: Insufficient documentation

## 2018-05-01 DIAGNOSIS — Z955 Presence of coronary angioplasty implant and graft: Secondary | ICD-10-CM

## 2018-05-01 DIAGNOSIS — G4733 Obstructive sleep apnea (adult) (pediatric): Secondary | ICD-10-CM | POA: Insufficient documentation

## 2018-05-01 DIAGNOSIS — I25118 Atherosclerotic heart disease of native coronary artery with other forms of angina pectoris: Secondary | ICD-10-CM | POA: Insufficient documentation

## 2018-05-01 DIAGNOSIS — Z992 Dependence on renal dialysis: Secondary | ICD-10-CM | POA: Insufficient documentation

## 2018-05-01 DIAGNOSIS — N186 End stage renal disease: Secondary | ICD-10-CM

## 2018-05-01 HISTORY — DX: Peripheral vascular disease, unspecified: I73.9

## 2018-05-01 HISTORY — DX: Dyspnea, unspecified: R06.00

## 2018-05-01 HISTORY — DX: Personal history of other diseases of the digestive system: Z87.19

## 2018-05-01 LAB — COMPREHENSIVE METABOLIC PANEL
ALT: 18 U/L (ref 0–44)
AST: 24 U/L (ref 15–41)
Albumin: 3 g/dL — ABNORMAL LOW (ref 3.5–5.0)
Alkaline Phosphatase: 123 U/L (ref 38–126)
Anion gap: 15 (ref 5–15)
BUN: 31 mg/dL — ABNORMAL HIGH (ref 6–20)
CO2: 25 mmol/L (ref 22–32)
Calcium: 9.4 mg/dL (ref 8.9–10.3)
Chloride: 94 mmol/L — ABNORMAL LOW (ref 98–111)
Creatinine, Ser: 5.54 mg/dL — ABNORMAL HIGH (ref 0.61–1.24)
GFR calc non Af Amer: 11 mL/min — ABNORMAL LOW (ref >60)
GFR, EST AFRICAN AMERICAN: 12 mL/min — AB (ref >60)
GLUCOSE: 154 mg/dL — AB (ref 70–99)
POTASSIUM: 4.3 mmol/L (ref 3.5–5.1)
Sodium: 134 mmol/L — ABNORMAL LOW (ref 135–145)
Total Bilirubin: 1.5 mg/dL — ABNORMAL HIGH (ref 0.3–1.2)
Total Protein: 6.5 g/dL (ref 6.5–8.1)

## 2018-05-01 LAB — URINALYSIS, ROUTINE W REFLEX MICROSCOPIC
Bilirubin Urine: NEGATIVE
Glucose, UA: 50 mg/dL — AB
Ketones, ur: NEGATIVE mg/dL
Leukocytes, UA: NEGATIVE
Nitrite: NEGATIVE
Protein, ur: 100 mg/dL — AB
SPECIFIC GRAVITY, URINE: 1.019 (ref 1.005–1.030)
pH: 5 (ref 5.0–8.0)

## 2018-05-01 LAB — CBC
HCT: 40.2 % (ref 39.0–52.0)
Hemoglobin: 11.9 g/dL — ABNORMAL LOW (ref 13.0–17.0)
MCH: 26 pg (ref 26.0–34.0)
MCHC: 29.6 g/dL — ABNORMAL LOW (ref 30.0–36.0)
MCV: 88 fL (ref 80.0–100.0)
Platelets: 122 10*3/uL — ABNORMAL LOW (ref 150–400)
RBC: 4.57 MIL/uL (ref 4.22–5.81)
RDW: 16.2 % — ABNORMAL HIGH (ref 11.5–15.5)
WBC: 4.7 10*3/uL (ref 4.0–10.5)
nRBC: 0 % (ref 0.0–0.2)

## 2018-05-01 LAB — HEMOGLOBIN A1C
HEMOGLOBIN A1C: 6 % — AB (ref 4.8–5.6)
Mean Plasma Glucose: 125.5 mg/dL

## 2018-05-01 LAB — GLUCOSE, CAPILLARY: Glucose-Capillary: 158 mg/dL — ABNORMAL HIGH (ref 70–99)

## 2018-05-01 LAB — TYPE AND SCREEN
ABO/RH(D): O POS
ANTIBODY SCREEN: NEGATIVE

## 2018-05-01 LAB — PROTIME-INR
INR: 1.23
Prothrombin Time: 15.4 seconds — ABNORMAL HIGH (ref 11.4–15.2)

## 2018-05-01 LAB — SURGICAL PCR SCREEN
MRSA, PCR: POSITIVE — AB
Staphylococcus aureus: POSITIVE — AB

## 2018-05-01 LAB — ABO/RH: ABO/RH(D): O POS

## 2018-05-01 LAB — APTT: aPTT: 37 seconds — ABNORMAL HIGH (ref 24–36)

## 2018-05-01 NOTE — Progress Notes (Signed)
All lab results from 05/01/2018 pre-op appointment shown to Dr. Donnetta Hutching. No orders given.

## 2018-05-01 NOTE — Progress Notes (Signed)
PCP -Gilford Rile Cardiologist - Dr. Curly Rim  Chest x-ray -02-13-18  EKG - 03-05-18 Stress Test - 10-11-2017 ECHO - 03-09-2018 Cardiac Cath -   Sleep Study - n/a CPAP - no  Fasting Blood Sugar - 118 Checks Blood Sugar 1-2_ times a day Blood Thinner Instructions: Aspirin Instructions:  Anesthesia review:yes  Patient denies shortness of breath, fever, cough and chest pain at PAT appointment   Patient verbalized understanding of instructions that were given to them at the PAT appointment. Patient was also instructed that they will need to review over the PAT instructions again at home before surgery.  Pt. Positive for staph and MRSA pt. Notified and prescription call to [facility].  Dr. Luther Parody office notified that pt. Was MRSA positive and platlet count 122.  Peri-operative Device orders faxed to Dr. Clovia Cuff [email or web address] .  [employer] Rep. Notified of time and date of patient surgery. (call if needed  Joey [phone])

## 2018-05-02 NOTE — Anesthesia Preprocedure Evaluation (Addendum)
Anesthesia Evaluation  Patient identified by MRN, date of birth, ID band Patient awake    Reviewed: Allergy & Precautions, NPO status , Patient's Chart, lab work & pertinent test results  Airway Mallampati: II  TM Distance: >3 FB Neck ROM: Full    Dental  (+) Edentulous Upper   Pulmonary asthma , sleep apnea , COPD (uses O2 at times),  COPD inhaler, Current Smoker,  CXR reviewed   Pulmonary exam normal breath sounds clear to auscultation       Cardiovascular hypertension, Pt. on home beta blockers + CAD, + Past MI, + CABG, + Peripheral Vascular Disease and +CHF  Normal cardiovascular exam+ dysrhythmias Atrial Fibrillation + Cardiac Defibrillator  Rhythm:Regular Rate:Normal  Cardiologist - Dr. Curly Rim  Cleared as high risk by cardiologist on 04/26/18.  EF 25% by echo 02/2018)    EF: of 30-35%   Neuro/Psych PSYCHIATRIC DISORDERS Anxiety Depression negative neurological ROS     GI/Hepatic Neg liver ROS, hiatal hernia, GERD  Medicated and Controlled,  Endo/Other  diabetes, Insulin Dependent  Renal/GU ESRF and DialysisRenal diseaseOn HD M,W,F     Musculoskeletal negative musculoskeletal ROS (+)   Abdominal   Peds  Hematology negative hematology ROS (+)   Anesthesia Other Findings PERIPHERAL VASCULAR DISEASE  Reproductive/Obstetrics                          Anesthesia Physical Anesthesia Plan  ASA: IV  Anesthesia Plan: General   Post-op Pain Management:    Induction: Intravenous  PONV Risk Score and Plan: 2 and Ondansetron, Dexamethasone and Treatment may vary due to age or medical condition  Airway Management Planned: Oral ETT  Additional Equipment: Arterial line  Intra-op Plan:   Post-operative Plan: Possible Post-op intubation/ventilation  Informed Consent: I have reviewed the patients History and Physical, chart, labs and discussed the procedure including the risks,  benefits and alternatives for the proposed anesthesia with the patient or authorized representative who has indicated his/her understanding and acceptance.     Dental advisory given  Plan Discussed with: CRNA  Anesthesia Plan Comments: (Reviewed PAT note by Karoline Caldwell, PA-C )      Anesthesia Quick Evaluation

## 2018-05-02 NOTE — Progress Notes (Signed)
Anesthesia Chart Review:  Case:  [id number] Date/Time:  05/04/18 0715   Procedures:      FEMORAL TO BELOW-THE-KNEE POPLITEAL BYPASS GRAFT (Left )     FEMORAL ENDARTERECTOMY (Left )   Anesthesia type:  General   Pre-op diagnosis:  PERIPHERAL VASCULAR DISEASE   Location:  [facility] OR ROOM 11 / [facility] OR   Surgeon:  Jose Posner, MD      DISCUSSION: 58 yo male current smoker. 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  CAD s/p multiple PCIs and CBG:  A. February 26, 2002, placement of 2.5 x 23 Cypher stent to his distal LAD and a 3.5 x 23 mm Cypher stent to his mid LAD.  B. Aug 09, 2004, placement of a 2.5 x 28 Cypher stent to his left PDA, ejection fraction 45% at that time.  C. March 2012, patient underwent left heart catheterization at [facility] for a NSTEMI with a troponin of 0.21. Left heart catheterization showed a distal LAD occlusion after the stent. Diagonal lesion 80%, OM lesion 90%, ramus 90%, ejection of 30-35% with inferior wall and apical hypokinesis. Patient was seen by CT surgery at [facility] and deemed a poor surgical candidate, secondary to poor targets. D. CABG x 4 Surgery [facility], [place] 11/10/2010):   Pt last seen by cardiology 03/12/2018, per OV note in care everywhere "Patient reports feeling well today in the clinic. He denies any chest pain, shortness of breath, dizziness, lightheadedness, ICD discharges, CHF symptoms, syncope or near syncope. His right groin access site is without issues. Compliant with all medications, low sodium diet, and fluid restrictions. Feels good."   Cleared as high risk by cardiologist on 04/26/18.  Anticipate he  can proceed as planned barring acute status change.   VS: BP 136/70   Pulse 60   Temp (!) 36.3 C (Oral)   Resp 18   Ht 6' (1.829 m)   Wt 82.5 kg   SpO2 99%   BMI 24.67 kg/m   PROVIDERS: Jose Sellers., MD is PCP  Jose Rim, MD is Cardiologist  Jose Oh, MD is Nephrologist  LABS: Labs reviewed: Acceptable for surgery. Dr. Donnetta Hutching has reviewed lab results.  (all labs ordered are listed, but only abnormal results are displayed)  Labs Reviewed  SURGICAL PCR SCREEN - Abnormal; Notable for the following components:      Result Value   MRSA, PCR POSITIVE (*)    Staphylococcus aureus POSITIVE (*)    All other components within normal limits  GLUCOSE, CAPILLARY - Abnormal; Notable for the following components:   Glucose-Capillary 158 (*)    All other components within normal limits  APTT - Abnormal; Notable for the following components:   aPTT 37 (*)    All other components within normal limits  CBC - Abnormal; Notable for the following components:   Hemoglobin 11.9 (*)    MCHC 29.6 (*)    RDW 16.2 (*)    Platelets 122 (*)    All other components within normal limits  COMPREHENSIVE METABOLIC PANEL - Abnormal; Notable for the following components:   Sodium 134 (*)    Chloride 94 (*)    Glucose, Bld  154 (*)    BUN 31 (*)    Creatinine, Ser 5.54 (*)    Albumin 3.0 (*)    Total Bilirubin 1.5 (*)    GFR calc non Af Amer 11 (*)    GFR calc Af Amer 12 (*)    All other components within normal limits  PROTIME-INR - Abnormal; Notable for the following components:   Prothrombin Time 15.4 (*)    All other components within normal limits  URINALYSIS, ROUTINE W REFLEX MICROSCOPIC - Abnormal; Notable for the following components:   Color, Urine AMBER (*)    Glucose, UA 50 (*)    Hgb urine dipstick MODERATE (*)    Protein, ur 100 (*)    Bacteria, UA RARE (*)    All other components within normal limits  HEMOGLOBIN A1C - Abnormal; Notable for the following components:    Hgb A1c MFr Bld 6.0 (*)    All other components within normal limits  TYPE AND SCREEN  ABO/RH     IMAGES: XR CHEST AP PORTABLE, 03/05/2018 (care everywhere):  INDICATION:wheezing \ J44.9 Chronic obstructive pulmonary disease, unspecified COPD type (HCC)  COMPARISON: Chest radiograph 12/11/2017  FINDINGS: Support Devices: Right IJ approach central catheter with tip over the right atrium. Unchanged left subclavian approach dual lead cardiac rhythm maintenance device. Cardiovascular/Mediastinum: Interval increase in the prominence of the cardiac silhouette is likely related to portable technique. Aortic atherosclerosis. Prior CABG. Lungs/pleura: Hazy interstitial prominence bilaterally. No pneumothorax. No pleural effusion.     Upper abdomen: Visible portions of the upper abdomen are within normal limits.     Chest wall/osseous structures: No acute osseous abnormality.      CONCLUSION:  Mild interstitial pulmonary edema   EKG: 02/13/2018: Normal sinus rhythm. Rate 64. Inferior infarct , age undetermined. Anterolateral infarct , age undetermined  CV: TTE 03/09/2018 (care everywhere): SUMMARY The left ventricle is moderately dilated. Mild left ventricular hypertrophy  Left ventricular systolic function is severely reduced. LV ejection fraction = 25-30%. There is severe global hypokinesis of the left ventricle. The right ventricle is normal in size and function. The left atrium is mildly dilated.  Right atrial size is normal. The aortic valve is trileaflet. Aortic valve calcification The aortic root is normal size. IVC size was normal. There is no pericardial effusion. Compared to study dated 10/31/17, no significant change.  Nuclear stress 10/10/17 (care everywhere): Ejection fraction: 19% Marked global hypokinesia. The inferior wall is difficult to assess due to overall low radiotracer uptake. Large apical/inferior wall transmural scar.  Limited reperfusion on stress  images may reflect collateralization.  No convincing evidence of inducible ischemia.    Past Medical History:  Diagnosis Date  . A-fib ([place])   . AICD (automatic cardioverter/defibrillator) present   . Anal fissure   . Anxiety   . Asthma   . CAD (coronary artery disease)   . CHF (congestive heart failure) ([place])   . COPD (chronic obstructive pulmonary disease) ([place])   . Degeneration of lumbar or lumbosacral intervertebral disc   . DM (diabetes mellitus), type 2, uncontrolled w/neurologic complication ([place])   . Dyspnea    with exertion  . Dysthymic disorder   . End stage renal disease on dialysis [facility])    dialysis M-W-F   [address] in [place]  . Essential (primary) hypertension   . GERD (gastroesophageal reflux disease)   . History of hiatal hernia   . Hx of colonic polyps   . Hyperlipemia   . Myocardial infarction ([place])   .  Peripheral vascular disease ([place])   . Pneumonia   . Recurrent major depression ([place])   . Sleep apnea   . Ventral hernia without obstruction or gangrene   . Wears dentures     Past Surgical History:  Procedure Laterality Date  . ABDOMINAL AORTOGRAM W/LOWER EXTREMITY N/A 04/20/2018   Procedure: ABDOMINAL AORTOGRAM W/LOWER EXTREMITY;  Surgeon: Elam Dutch, MD;  Location: [place] [facility];  Service: Cardiovascular;  Laterality: N/A;  . AV FISTULA PLACEMENT Right 02/13/2018   Procedure: CREATION OF RADIOCEPHALIC ARTERIOVENOUS FISTULA RIGHT ARM;  Surgeon: Elam Dutch, MD;  Location: [place];  Service: Vascular;  Laterality: Right;  . CAD s/p cabbage    . CARDIAC DEFIBRILLATOR PLACEMENT  2012  . CATARACT EXTRACTION W/ INTRAOCULAR LENS IMPLANT    . COLONOSCOPY  12/27/2013   Colonic polyps status post polypectomy.  Mild sigmoid diverticulosis. Internal hemorrhoids. Tubular adenoma.   . CORONARY ANGIOPLASTY    . CORONARY STENT PLACEMENT    . ESOPHAGOGASTRODUODENOSCOPY  12/01/2016   Mild gastritis.   . heart bypass    . HERNIA REPAIR     . KNEE SURGERY      MEDICATIONS: . acetaminophen-codeine (TYLENOL #3) 300-30 MG tablet  . amiodarone (PACERONE) 200 MG tablet  . aspirin 81 MG tablet  . atorvastatin (LIPITOR) 40 MG tablet  . B Complex-C-Folic Acid (DIALYVITE 161) 0.8 MG TABS  . calcitRIOL (ROCALTROL) 0.25 MCG capsule  . esomeprazole (NEXIUM) 40 MG capsule  . ferric citrate (AURYXIA) 1 GM 210 MG(Fe) tablet  . FLUoxetine (PROZAC) 20 MG capsule  . furosemide (LASIX) 40 MG tablet  . gabapentin (NEURONTIN) 300 MG capsule  . Insulin Glargine (LANTUS SOLOSTAR) 100 UNIT/ML Solostar Pen  . insulin lispro (HUMALOG KWIKPEN) 100 UNIT/ML KwikPen  . levalbuterol (XOPENEX) 0.63 MG/3ML nebulizer solution  . lidocaine-prilocaine (EMLA) cream  . metoprolol tartrate (LOPRESSOR) 25 MG tablet  . nitroGLYCERIN (NITROSTAT) 0.4 MG SL tablet  . oxycodone (ROXICODONE) 30 MG immediate release tablet  . oxyCODONE-acetaminophen (PERCOCET) 7.5-325 MG tablet  . SANTYL ointment  . tiotropium (SPIRIVA) 18 MCG inhalation capsule   No current facility-administered medications for this encounter.      Wynonia Musty [facility] Short Stay Center/Anesthesiology Phone [phone] 05/02/2018 11:43 AM

## 2018-05-03 ENCOUNTER — Ambulatory Visit: Payer: Medicare Other | Admitting: Sports Medicine

## 2018-05-04 ENCOUNTER — Encounter (HOSPITAL_COMMUNITY): Payer: Self-pay | Admitting: *Deleted

## 2018-05-04 ENCOUNTER — Inpatient Hospital Stay (HOSPITAL_COMMUNITY): Payer: Medicare Other | Admitting: Anesthesiology

## 2018-05-04 ENCOUNTER — Encounter (HOSPITAL_COMMUNITY)
Admission: RE | Disposition: A | Discharge status: Home / Self Care | Payer: Self-pay | Source: Home / Self Care | Attending: Vascular Surgery

## 2018-05-04 ENCOUNTER — Other Ambulatory Visit: Payer: Self-pay

## 2018-05-04 ENCOUNTER — Inpatient Hospital Stay (HOSPITAL_COMMUNITY): Payer: Medicare Other

## 2018-05-04 ENCOUNTER — Inpatient Hospital Stay (HOSPITAL_COMMUNITY)
Admission: RE | Admit: 2018-05-04 | Discharge: 2018-05-06 | DRG: 252 | Disposition: A | Discharge status: Home / Self Care | Payer: Medicare Other | Attending: Vascular Surgery | Admitting: Vascular Surgery

## 2018-05-04 ENCOUNTER — Inpatient Hospital Stay (HOSPITAL_COMMUNITY): Payer: Medicare Other | Admitting: Physician Assistant

## 2018-05-04 DIAGNOSIS — Z951 Presence of aortocoronary bypass graft: Secondary | ICD-10-CM | POA: Diagnosis not present

## 2018-05-04 DIAGNOSIS — J440 Chronic obstructive pulmonary disease with acute lower respiratory infection: Secondary | ICD-10-CM | POA: Diagnosis present

## 2018-05-04 DIAGNOSIS — I4891 Unspecified atrial fibrillation: Secondary | ICD-10-CM | POA: Diagnosis present

## 2018-05-04 DIAGNOSIS — R05 Cough: Secondary | ICD-10-CM

## 2018-05-04 DIAGNOSIS — E11621 Type 2 diabetes mellitus with foot ulcer: Secondary | ICD-10-CM | POA: Diagnosis present

## 2018-05-04 DIAGNOSIS — Z86010 Personal history of colonic polyps: Secondary | ICD-10-CM | POA: Diagnosis not present

## 2018-05-04 DIAGNOSIS — F419 Anxiety disorder, unspecified: Secondary | ICD-10-CM | POA: Diagnosis present

## 2018-05-04 DIAGNOSIS — I70245 Atherosclerosis of native arteries of left leg with ulceration of other part of foot: Secondary | ICD-10-CM

## 2018-05-04 DIAGNOSIS — D631 Anemia in chronic kidney disease: Secondary | ICD-10-CM | POA: Diagnosis present

## 2018-05-04 DIAGNOSIS — D696 Thrombocytopenia, unspecified: Secondary | ICD-10-CM | POA: Diagnosis present

## 2018-05-04 DIAGNOSIS — I509 Heart failure, unspecified: Secondary | ICD-10-CM | POA: Diagnosis present

## 2018-05-04 DIAGNOSIS — L97529 Non-pressure chronic ulcer of other part of left foot with unspecified severity: Secondary | ICD-10-CM | POA: Diagnosis present

## 2018-05-04 DIAGNOSIS — E1151 Type 2 diabetes mellitus with diabetic peripheral angiopathy without gangrene: Principal | ICD-10-CM | POA: Diagnosis present

## 2018-05-04 DIAGNOSIS — N186 End stage renal disease: Secondary | ICD-10-CM | POA: Diagnosis present

## 2018-05-04 DIAGNOSIS — Z7982 Long term (current) use of aspirin: Secondary | ICD-10-CM

## 2018-05-04 DIAGNOSIS — Z992 Dependence on renal dialysis: Secondary | ICD-10-CM | POA: Diagnosis not present

## 2018-05-04 DIAGNOSIS — N2581 Secondary hyperparathyroidism of renal origin: Secondary | ICD-10-CM | POA: Diagnosis present

## 2018-05-04 DIAGNOSIS — I998 Other disorder of circulatory system: Secondary | ICD-10-CM | POA: Diagnosis present

## 2018-05-04 DIAGNOSIS — G4733 Obstructive sleep apnea (adult) (pediatric): Secondary | ICD-10-CM | POA: Diagnosis present

## 2018-05-04 DIAGNOSIS — R059 Cough, unspecified: Secondary | ICD-10-CM

## 2018-05-04 DIAGNOSIS — F17210 Nicotine dependence, cigarettes, uncomplicated: Secondary | ICD-10-CM | POA: Diagnosis present

## 2018-05-04 DIAGNOSIS — Z95810 Presence of automatic (implantable) cardiac defibrillator: Secondary | ICD-10-CM | POA: Diagnosis not present

## 2018-05-04 DIAGNOSIS — I2510 Atherosclerotic heart disease of native coronary artery without angina pectoris: Secondary | ICD-10-CM | POA: Diagnosis present

## 2018-05-04 DIAGNOSIS — Z955 Presence of coronary angioplasty implant and graft: Secondary | ICD-10-CM | POA: Diagnosis not present

## 2018-05-04 DIAGNOSIS — I132 Hypertensive heart and chronic kidney disease with heart failure and with stage 5 chronic kidney disease, or end stage renal disease: Secondary | ICD-10-CM | POA: Diagnosis present

## 2018-05-04 DIAGNOSIS — I252 Old myocardial infarction: Secondary | ICD-10-CM | POA: Diagnosis not present

## 2018-05-04 DIAGNOSIS — Z79899 Other long term (current) drug therapy: Secondary | ICD-10-CM

## 2018-05-04 DIAGNOSIS — Z885 Allergy status to narcotic agent status: Secondary | ICD-10-CM

## 2018-05-04 DIAGNOSIS — Z794 Long term (current) use of insulin: Secondary | ICD-10-CM

## 2018-05-04 DIAGNOSIS — E1122 Type 2 diabetes mellitus with diabetic chronic kidney disease: Secondary | ICD-10-CM | POA: Diagnosis present

## 2018-05-04 DIAGNOSIS — Z888 Allergy status to other drugs, medicaments and biological substances status: Secondary | ICD-10-CM

## 2018-05-04 DIAGNOSIS — K219 Gastro-esophageal reflux disease without esophagitis: Secondary | ICD-10-CM | POA: Diagnosis present

## 2018-05-04 DIAGNOSIS — I739 Peripheral vascular disease, unspecified: Secondary | ICD-10-CM | POA: Diagnosis present

## 2018-05-04 DIAGNOSIS — E785 Hyperlipidemia, unspecified: Secondary | ICD-10-CM | POA: Diagnosis present

## 2018-05-04 DIAGNOSIS — Z7901 Long term (current) use of anticoagulants: Secondary | ICD-10-CM

## 2018-05-04 HISTORY — PX: PATCH ANGIOPLASTY: SHX6230

## 2018-05-04 HISTORY — PX: ENDARTERECTOMY: SHX5162

## 2018-05-04 HISTORY — PX: FEMORAL-POPLITEAL BYPASS GRAFT: SHX937

## 2018-05-04 LAB — GLUCOSE, CAPILLARY
GLUCOSE-CAPILLARY: 115 mg/dL — AB (ref 70–99)
GLUCOSE-CAPILLARY: 122 mg/dL — AB (ref 70–99)
Glucose-Capillary: 148 mg/dL — ABNORMAL HIGH (ref 70–99)
Glucose-Capillary: 155 mg/dL — ABNORMAL HIGH (ref 70–99)

## 2018-05-04 LAB — RENAL FUNCTION PANEL
ALBUMIN: 2.6 g/dL — AB (ref 3.5–5.0)
Anion gap: 15 (ref 5–15)
BUN: 36 mg/dL — AB (ref 6–20)
CO2: 24 mmol/L (ref 22–32)
Calcium: 8.6 mg/dL — ABNORMAL LOW (ref 8.9–10.3)
Chloride: 97 mmol/L — ABNORMAL LOW (ref 98–111)
Creatinine, Ser: 6.33 mg/dL — ABNORMAL HIGH (ref 0.61–1.24)
GFR calc Af Amer: 10 mL/min — ABNORMAL LOW (ref >60)
GFR calc non Af Amer: 9 mL/min — ABNORMAL LOW (ref >60)
Glucose, Bld: 148 mg/dL — ABNORMAL HIGH (ref 70–99)
POTASSIUM: 4.6 mmol/L (ref 3.5–5.1)
Phosphorus: 6 mg/dL — ABNORMAL HIGH (ref 2.5–4.6)
Sodium: 136 mmol/L (ref 135–145)

## 2018-05-04 LAB — CBC
HCT: 32.1 % — ABNORMAL LOW (ref 39.0–52.0)
Hemoglobin: 9.9 g/dL — ABNORMAL LOW (ref 13.0–17.0)
MCH: 26.7 pg (ref 26.0–34.0)
MCHC: 30.8 g/dL (ref 30.0–36.0)
MCV: 86.5 fL (ref 80.0–100.0)
Platelets: 112 10*3/uL — ABNORMAL LOW (ref 150–400)
RBC: 3.71 MIL/uL — ABNORMAL LOW (ref 4.22–5.81)
RDW: 15.9 % — ABNORMAL HIGH (ref 11.5–15.5)
WBC: 4.5 10*3/uL (ref 4.0–10.5)
nRBC: 0 % (ref 0.0–0.2)

## 2018-05-04 LAB — POCT I-STAT 4, (NA,K, GLUC, HGB,HCT)
Glucose, Bld: 151 mg/dL — ABNORMAL HIGH (ref 70–99)
HCT: 40 % (ref 39.0–52.0)
Hemoglobin: 13.6 g/dL (ref 13.0–17.0)
Potassium: 4 mmol/L (ref 3.5–5.1)
Sodium: 132 mmol/L — ABNORMAL LOW (ref 135–145)

## 2018-05-04 LAB — CREATININE, SERUM
Creatinine, Ser: 5.74 mg/dL — ABNORMAL HIGH (ref 0.61–1.24)
GFR calc Af Amer: 12 mL/min — ABNORMAL LOW (ref >60)
GFR calc non Af Amer: 10 mL/min — ABNORMAL LOW (ref >60)

## 2018-05-04 SURGERY — BYPASS GRAFT FEMORAL-POPLITEAL ARTERY
Anesthesia: General | Laterality: Left

## 2018-05-04 MED ORDER — PHENYLEPHRINE 40 MCG/ML (10ML) SYRINGE FOR IV PUSH (FOR BLOOD PRESSURE SUPPORT)
PREFILLED_SYRINGE | INTRAVENOUS | Status: AC
  Filled 2018-05-04: qty 10

## 2018-05-04 MED ORDER — IPRATROPIUM BROMIDE HFA 17 MCG/ACT IN AERS
2.0000 | INHALATION_SPRAY | RESPIRATORY_TRACT | Status: DC
  Filled 2018-05-04: qty 12.9

## 2018-05-04 MED ORDER — SODIUM CHLORIDE 0.9 % IV SOLN: 100.0000 mL | INTRAVENOUS | Status: DC | PRN

## 2018-05-04 MED ORDER — SODIUM CHLORIDE 0.9 % IV SOLN: INTRAVENOUS | Status: DC

## 2018-05-04 MED ORDER — PHENOL 1.4 % MT LIQD: 1.0000 | OROMUCOSAL | Status: DC | PRN

## 2018-05-04 MED ORDER — LIDOCAINE 2% (20 MG/ML) 5 ML SYRINGE
INTRAMUSCULAR | Status: AC
  Filled 2018-05-04: qty 5

## 2018-05-04 MED ORDER — INSULIN GLARGINE 100 UNIT/ML SOLOSTAR PEN: 10.0000 [IU] | PEN_INJECTOR | Freq: Every day | SUBCUTANEOUS | Status: DC

## 2018-05-04 MED ORDER — LIDOCAINE HCL (PF) 1 % IJ SOLN: 5.0000 mL | INTRAMUSCULAR | Status: DC | PRN

## 2018-05-04 MED ORDER — LIDOCAINE 2% (20 MG/ML) 5 ML SYRINGE
INTRAMUSCULAR | Status: DC | PRN
  Administered 2018-05-04: 60 mg via INTRAVENOUS

## 2018-05-04 MED ORDER — GLYCOPYRROLATE PF 0.2 MG/ML IJ SOSY
PREFILLED_SYRINGE | INTRAMUSCULAR | Status: AC
  Filled 2018-05-04: qty 2

## 2018-05-04 MED ORDER — HEPARIN SODIUM (PORCINE) 5000 UNIT/ML IJ SOLN
5000.0000 [IU] | Freq: Three times a day (TID) | INTRAMUSCULAR | Status: DC
  Administered 2018-05-05 – 2018-05-06 (×4): 5000 [IU] via SUBCUTANEOUS
  Filled 2018-05-04 (×5): qty 1

## 2018-05-04 MED ORDER — PENTAFLUOROPROP-TETRAFLUOROETH EX AERO: 1.0000 "application " | INHALATION_SPRAY | CUTANEOUS | Status: DC | PRN

## 2018-05-04 MED ORDER — POTASSIUM CHLORIDE CRYS ER 20 MEQ PO TBCR: 20.0000 meq | EXTENDED_RELEASE_TABLET | Freq: Every day | ORAL | Status: DC | PRN

## 2018-05-04 MED ORDER — SODIUM CHLORIDE 0.9 % IV SOLN
INTRAVENOUS | Status: DC | PRN
  Administered 2018-05-04: 07:00:00

## 2018-05-04 MED ORDER — FENTANYL CITRATE (PF) 250 MCG/5ML IJ SOLN
INTRAMUSCULAR | Status: AC
  Filled 2018-05-04: qty 5

## 2018-05-04 MED ORDER — CALCITRIOL 0.25 MCG PO CAPS: 0.5000 ug | ORAL_CAPSULE | ORAL | Status: DC

## 2018-05-04 MED ORDER — GLYCOPYRROLATE 0.2 MG/ML IJ SOLN
INTRAMUSCULAR | Status: DC | PRN
  Administered 2018-05-04: 0.4 mg via INTRAVENOUS

## 2018-05-04 MED ORDER — OXYCODONE-ACETAMINOPHEN 7.5-325 MG PO TABS
1.0000 | ORAL_TABLET | Freq: Four times a day (QID) | ORAL | Status: DC | PRN
  Administered 2018-05-04 – 2018-05-06 (×5): 1 via ORAL
  Filled 2018-05-04 (×5): qty 1

## 2018-05-04 MED ORDER — ONDANSETRON HCL 4 MG/2ML IJ SOLN: 4.0000 mg | Freq: Four times a day (QID) | INTRAMUSCULAR | Status: DC | PRN

## 2018-05-04 MED ORDER — CHLORHEXIDINE GLUCONATE CLOTH 2 % EX PADS: 6.0000 | MEDICATED_PAD | Freq: Once | CUTANEOUS | Status: DC

## 2018-05-04 MED ORDER — MIDAZOLAM HCL 2 MG/2ML IJ SOLN
INTRAMUSCULAR | Status: AC
  Filled 2018-05-04: qty 2

## 2018-05-04 MED ORDER — NEOSTIGMINE METHYLSULFATE 10 MG/10ML IV SOLN
INTRAVENOUS | Status: DC | PRN
  Administered 2018-05-04: 3 mg via INTRAVENOUS

## 2018-05-04 MED ORDER — SODIUM CHLORIDE 0.9 % IV SOLN
INTRAVENOUS | Status: DC
  Administered 2018-05-04 – 2018-05-05 (×2): via INTRAVENOUS

## 2018-05-04 MED ORDER — IPRATROPIUM BROMIDE HFA 17 MCG/ACT IN AERS
INHALATION_SPRAY | RESPIRATORY_TRACT | Status: DC | PRN
  Administered 2018-05-04: 2 via RESPIRATORY_TRACT

## 2018-05-04 MED ORDER — FERRIC CITRATE 1 GM 210 MG(FE) PO TABS
420.0000 mg | ORAL_TABLET | Freq: Three times a day (TID) | ORAL | Status: DC
  Administered 2018-05-05 – 2018-05-06 (×4): 420 mg via ORAL
  Filled 2018-05-04 (×7): qty 2

## 2018-05-04 MED ORDER — PHENYLEPHRINE 40 MCG/ML (10ML) SYRINGE FOR IV PUSH (FOR BLOOD PRESSURE SUPPORT)
PREFILLED_SYRINGE | INTRAVENOUS | Status: DC | PRN
  Administered 2018-05-04: 120 ug via INTRAVENOUS

## 2018-05-04 MED ORDER — SODIUM CHLORIDE 0.9 % IV SOLN
INTRAVENOUS | Status: DC | PRN
  Administered 2018-05-04: 07:00:00 via INTRAVENOUS

## 2018-05-04 MED ORDER — GABAPENTIN 300 MG PO CAPS
300.0000 mg | ORAL_CAPSULE | Freq: Every day | ORAL | Status: DC
  Administered 2018-05-04 – 2018-05-05 (×2): 300 mg via ORAL
  Filled 2018-05-04 (×2): qty 1

## 2018-05-04 MED ORDER — SODIUM CHLORIDE 0.9 % IV SOLN
INTRAVENOUS | Status: DC | PRN
  Administered 2018-05-04: 50 ug/min via INTRAVENOUS

## 2018-05-04 MED ORDER — SODIUM CHLORIDE 0.9 % IV SOLN
INTRAVENOUS | Status: AC
  Filled 2018-05-04: qty 1.2

## 2018-05-04 MED ORDER — HEPARIN SODIUM (PORCINE) 1000 UNIT/ML IJ SOLN
INTRAMUSCULAR | Status: AC
  Filled 2018-05-04: qty 1

## 2018-05-04 MED ORDER — 0.9 % SODIUM CHLORIDE (POUR BTL) OPTIME
TOPICAL | Status: DC | PRN
  Administered 2018-05-04: 2000 mL

## 2018-05-04 MED ORDER — ALTEPLASE 2 MG IJ SOLR: 2.0000 mg | Freq: Once | INTRAMUSCULAR | Status: DC | PRN

## 2018-05-04 MED ORDER — PROPOFOL 10 MG/ML IV BOLUS
INTRAVENOUS | Status: DC | PRN
  Administered 2018-05-04: 200 mg via INTRAVENOUS

## 2018-05-04 MED ORDER — ONDANSETRON HCL 4 MG/2ML IJ SOLN
INTRAMUSCULAR | Status: DC | PRN
  Administered 2018-05-04: 4 mg via INTRAVENOUS

## 2018-05-04 MED ORDER — SODIUM CHLORIDE 0.9 % IV SOLN: 500.0000 mL | Freq: Once | INTRAVENOUS | Status: DC | PRN

## 2018-05-04 MED ORDER — PROTAMINE SULFATE 10 MG/ML IV SOLN
INTRAVENOUS | Status: AC
  Filled 2018-05-04: qty 5

## 2018-05-04 MED ORDER — METOPROLOL TARTRATE 5 MG/5ML IV SOLN: 2.0000 mg | INTRAVENOUS | Status: DC | PRN

## 2018-05-04 MED ORDER — ONDANSETRON HCL 4 MG/2ML IJ SOLN
INTRAMUSCULAR | Status: AC
  Filled 2018-05-04: qty 2

## 2018-05-04 MED ORDER — EPHEDRINE 5 MG/ML INJ
INTRAVENOUS | Status: AC
  Filled 2018-05-04: qty 30

## 2018-05-04 MED ORDER — AMIODARONE HCL 200 MG PO TABS
200.0000 mg | ORAL_TABLET | Freq: Two times a day (BID) | ORAL | Status: DC
  Administered 2018-05-04 – 2018-05-06 (×4): 200 mg via ORAL
  Filled 2018-05-04 (×4): qty 1

## 2018-05-04 MED ORDER — DEXAMETHASONE SODIUM PHOSPHATE 10 MG/ML IJ SOLN
INTRAMUSCULAR | Status: AC
  Filled 2018-05-04: qty 1

## 2018-05-04 MED ORDER — LIDOCAINE-PRILOCAINE 2.5-2.5 % EX CREA
1.0000 "application " | TOPICAL_CREAM | CUTANEOUS | Status: DC | PRN
  Filled 2018-05-04: qty 5

## 2018-05-04 MED ORDER — MUPIROCIN 2 % EX OINT
1.0000 "application " | TOPICAL_OINTMENT | Freq: Two times a day (BID) | CUTANEOUS | Status: AC
  Administered 2018-05-04 – 2018-05-05 (×3): 1 via TOPICAL
  Filled 2018-05-04 (×2): qty 22

## 2018-05-04 MED ORDER — ASPIRIN EC 81 MG PO TBEC
81.0000 mg | DELAYED_RELEASE_TABLET | Freq: Every day | ORAL | Status: DC
  Administered 2018-05-05 – 2018-05-06 (×2): 81 mg via ORAL
  Filled 2018-05-04 (×2): qty 1

## 2018-05-04 MED ORDER — CEFAZOLIN SODIUM-DEXTROSE 2-4 GM/100ML-% IV SOLN
2.0000 g | Freq: Three times a day (TID) | INTRAVENOUS | Status: AC
  Administered 2018-05-04: 2 g via INTRAVENOUS
  Filled 2018-05-04: qty 100

## 2018-05-04 MED ORDER — VANCOMYCIN HCL 1000 MG IV SOLR
INTRAVENOUS | Status: DC | PRN
  Administered 2018-05-04: 1000 mg via INTRAVENOUS

## 2018-05-04 MED ORDER — HYDROMORPHONE HCL 1 MG/ML IJ SOLN
0.5000 mg | INTRAMUSCULAR | Status: DC | PRN
  Administered 2018-05-05 (×2): 1 mg via INTRAVENOUS
  Filled 2018-05-04 (×2): qty 1

## 2018-05-04 MED ORDER — RENA-VITE PO TABS
1.0000 | ORAL_TABLET | Freq: Every day | ORAL | Status: DC
  Administered 2018-05-04 – 2018-05-05 (×2): 1 via ORAL
  Filled 2018-05-04 (×2): qty 1

## 2018-05-04 MED ORDER — HEPARIN SODIUM (PORCINE) 1000 UNIT/ML IJ SOLN
INTRAMUSCULAR | Status: AC
  Filled 2018-05-04: qty 4

## 2018-05-04 MED ORDER — HEPARIN SODIUM (PORCINE) 1000 UNIT/ML IJ SOLN
3.8000 mL | Freq: Once | INTRAMUSCULAR | Status: AC
  Administered 2018-05-04: 3800 [IU] via INTRAVENOUS

## 2018-05-04 MED ORDER — HEPARIN SODIUM (PORCINE) 1000 UNIT/ML IJ SOLN: 3.8000 mL | Freq: Once | INTRAMUSCULAR | Status: DC

## 2018-05-04 MED ORDER — ALUM & MAG HYDROXIDE-SIMETH 200-200-20 MG/5ML PO SUSP: 15.0000 mL | ORAL | Status: DC | PRN

## 2018-05-04 MED ORDER — ACETAMINOPHEN 325 MG RE SUPP: 325.0000 mg | RECTAL | Status: DC | PRN

## 2018-05-04 MED ORDER — DEXAMETHASONE SODIUM PHOSPHATE 10 MG/ML IJ SOLN
INTRAMUSCULAR | Status: DC | PRN
  Administered 2018-05-04: 5 mg via INTRAVENOUS

## 2018-05-04 MED ORDER — GUAIFENESIN-DM 100-10 MG/5ML PO SYRP: 15.0000 mL | ORAL_SOLUTION | ORAL | Status: DC | PRN

## 2018-05-04 MED ORDER — FENTANYL CITRATE (PF) 100 MCG/2ML IJ SOLN
INTRAMUSCULAR | Status: DC | PRN
  Administered 2018-05-04 (×4): 50 ug via INTRAVENOUS

## 2018-05-04 MED ORDER — INSULIN GLARGINE 100 UNIT/ML ~~LOC~~ SOLN
10.0000 [IU] | Freq: Every day | SUBCUTANEOUS | Status: DC
  Administered 2018-05-04: 10 [IU] via SUBCUTANEOUS
  Filled 2018-05-04 (×2): qty 0.1

## 2018-05-04 MED ORDER — METOPROLOL TARTRATE 12.5 MG HALF TABLET
12.5000 mg | ORAL_TABLET | Freq: Two times a day (BID) | ORAL | Status: DC
  Administered 2018-05-04 – 2018-05-06 (×4): 12.5 mg via ORAL
  Filled 2018-05-04 (×4): qty 1

## 2018-05-04 MED ORDER — INSULIN ASPART 100 UNIT/ML ~~LOC~~ SOLN
0.0000 [IU] | Freq: Three times a day (TID) | SUBCUTANEOUS | Status: DC
  Administered 2018-05-05: 2 [IU] via SUBCUTANEOUS

## 2018-05-04 MED ORDER — ALBUMIN HUMAN 5 % IV SOLN
INTRAVENOUS | Status: DC | PRN
  Administered 2018-05-04: 11:00:00 via INTRAVENOUS

## 2018-05-04 MED ORDER — LABETALOL HCL 5 MG/ML IV SOLN: 10.0000 mg | INTRAVENOUS | Status: DC | PRN

## 2018-05-04 MED ORDER — ATORVASTATIN CALCIUM 40 MG PO TABS
40.0000 mg | ORAL_TABLET | Freq: Every day | ORAL | Status: DC
  Administered 2018-05-04 – 2018-05-05 (×2): 40 mg via ORAL
  Filled 2018-05-04 (×2): qty 1

## 2018-05-04 MED ORDER — NEOSTIGMINE METHYLSULFATE 3 MG/3ML IV SOSY
PREFILLED_SYRINGE | INTRAVENOUS | Status: AC
  Filled 2018-05-04: qty 3

## 2018-05-04 MED ORDER — DOCUSATE SODIUM 100 MG PO CAPS
100.0000 mg | ORAL_CAPSULE | Freq: Every day | ORAL | Status: DC
  Administered 2018-05-05 – 2018-05-06 (×2): 100 mg via ORAL
  Filled 2018-05-04 (×2): qty 1

## 2018-05-04 MED ORDER — HYDRALAZINE HCL 20 MG/ML IJ SOLN: 5.0000 mg | INTRAMUSCULAR | Status: DC | PRN

## 2018-05-04 MED ORDER — MAGNESIUM SULFATE 2 GM/50ML IV SOLN: 2.0000 g | Freq: Every day | INTRAVENOUS | Status: DC | PRN

## 2018-05-04 MED ORDER — PANTOPRAZOLE SODIUM 40 MG PO TBEC
40.0000 mg | DELAYED_RELEASE_TABLET | Freq: Every day | ORAL | Status: DC
  Administered 2018-05-04 – 2018-05-06 (×3): 40 mg via ORAL
  Filled 2018-05-04 (×3): qty 1

## 2018-05-04 MED ORDER — FLUOXETINE HCL 20 MG PO CAPS
20.0000 mg | ORAL_CAPSULE | Freq: Every day | ORAL | Status: DC
  Administered 2018-05-04 – 2018-05-06 (×3): 20 mg via ORAL
  Filled 2018-05-04 (×3): qty 1

## 2018-05-04 MED ORDER — HEPARIN SODIUM (PORCINE) 1000 UNIT/ML DIALYSIS
1000.0000 [IU] | INTRAMUSCULAR | Status: DC | PRN
  Filled 2018-05-04: qty 1

## 2018-05-04 MED ORDER — ACETAMINOPHEN 325 MG PO TABS: 325.0000 mg | ORAL_TABLET | ORAL | Status: DC | PRN

## 2018-05-04 MED ORDER — PROTAMINE SULFATE 10 MG/ML IV SOLN
INTRAVENOUS | Status: DC | PRN
  Administered 2018-05-04: 10 mg via INTRAVENOUS
  Administered 2018-05-04: 40 mg via INTRAVENOUS

## 2018-05-04 MED ORDER — FUROSEMIDE 40 MG PO TABS: 40.0000 mg | ORAL_TABLET | Freq: Every day | ORAL | Status: DC | PRN

## 2018-05-04 MED ORDER — CISATRACURIUM BESYLATE (PF) 10 MG/5ML IV SOLN
INTRAVENOUS | Status: DC | PRN
  Administered 2018-05-04: 4 mg via INTRAVENOUS
  Administered 2018-05-04: 2 mg via INTRAVENOUS
  Administered 2018-05-04: 4 mg via INTRAVENOUS
  Administered 2018-05-04 (×2): 2 mg via INTRAVENOUS
  Administered 2018-05-04: 12 mg via INTRAVENOUS

## 2018-05-04 MED ORDER — METOPROLOL TARTRATE 12.5 MG HALF TABLET
12.5000 mg | ORAL_TABLET | Freq: Once | ORAL | Status: AC
  Administered 2018-05-04: 12.5 mg via ORAL
  Filled 2018-05-04: qty 1

## 2018-05-04 MED ORDER — EPHEDRINE SULFATE-NACL 50-0.9 MG/10ML-% IV SOSY
PREFILLED_SYRINGE | INTRAVENOUS | Status: DC | PRN
  Administered 2018-05-04: 5 mg via INTRAVENOUS
  Administered 2018-05-04 (×3): 10 mg via INTRAVENOUS

## 2018-05-04 MED ORDER — HEPARIN SODIUM (PORCINE) 1000 UNIT/ML IJ SOLN
INTRAMUSCULAR | Status: DC | PRN
  Administered 2018-05-04: 8000 [IU] via INTRAVENOUS
  Administered 2018-05-04: 3000 [IU] via INTRAVENOUS

## 2018-05-04 MED ORDER — PROPOFOL 10 MG/ML IV BOLUS
INTRAVENOUS | Status: AC
  Filled 2018-05-04: qty 20

## 2018-05-04 MED ORDER — MIDAZOLAM HCL 2 MG/2ML IJ SOLN
INTRAMUSCULAR | Status: DC | PRN
  Administered 2018-05-04: 1 mg via INTRAVENOUS

## 2018-05-04 MED ORDER — MUPIROCIN 2 % EX OINT
TOPICAL_OINTMENT | CUTANEOUS | Status: AC
  Administered 2018-05-04: 06:00:00
  Filled 2018-05-04: qty 22

## 2018-05-04 MED ORDER — CHLORHEXIDINE GLUCONATE CLOTH 2 % EX PADS
6.0000 | MEDICATED_PAD | Freq: Every day | CUTANEOUS | Status: DC
  Administered 2018-05-05 – 2018-05-06 (×2): 6 via TOPICAL

## 2018-05-04 SURGICAL SUPPLY — 52 items
BANDAGE ESMARK 6X9 LF (GAUZE/BANDAGES/DRESSINGS) IMPLANT
BNDG ESMARK 6X9 LF (GAUZE/BANDAGES/DRESSINGS) ×3
CANISTER SUCT 3000ML PPV (MISCELLANEOUS) ×3 IMPLANT
CANNULA VESSEL 3MM 2 BLNT TIP (CANNULA) ×8 IMPLANT
CATH ROBINSON RED A/P 18FR (CATHETERS) ×3 IMPLANT
CLIP LIGATING EXTRA MED SLVR (CLIP) ×3 IMPLANT
CLIP LIGATING EXTRA SM BLUE (MISCELLANEOUS) ×3 IMPLANT
COVER WAND RF STERILE (DRAPES) ×3 IMPLANT
CUFF TOURN SGL QUICK 24 (TOURNIQUET CUFF) ×2
CUFF TRNQT CYL 24X4X16.5-23 (TOURNIQUET CUFF) IMPLANT
DERMABOND ADVANCED (GAUZE/BANDAGES/DRESSINGS) ×4
DERMABOND ADVANCED .7 DNX12 (GAUZE/BANDAGES/DRESSINGS) IMPLANT
DRAIN HEMOVAC 1/8 X 5 (WOUND CARE) IMPLANT
DRAIN SNY 10X20 3/4 PERF (WOUND CARE) IMPLANT
ELECT REM PT RETURN 9FT ADLT (ELECTROSURGICAL) ×3
ELECTRODE REM PT RTRN 9FT ADLT (ELECTROSURGICAL) ×1 IMPLANT
EVACUATOR SILICONE 100CC (DRAIN) IMPLANT
GLOVE BIO SURGEON STRL SZ7.5 (GLOVE) ×2 IMPLANT
GLOVE BIOGEL PI IND STRL 6.5 (GLOVE) IMPLANT
GLOVE BIOGEL PI IND STRL 7.0 (GLOVE) IMPLANT
GLOVE BIOGEL PI INDICATOR 6.5 (GLOVE) ×4
GLOVE BIOGEL PI INDICATOR 7.0 (GLOVE) ×4
GLOVE ECLIPSE 7.0 STRL STRAW (GLOVE) ×2 IMPLANT
GLOVE SS BIOGEL STRL SZ 7.5 (GLOVE) ×1 IMPLANT
GLOVE SUPERSENSE BIOGEL SZ 7.5 (GLOVE) ×2
GLOVE SURG SS PI 7.0 STRL IVOR (GLOVE) ×2 IMPLANT
GOWN STRL REUS W/ TWL LRG LVL3 (GOWN DISPOSABLE) ×3 IMPLANT
GOWN STRL REUS W/ TWL XL LVL3 (GOWN DISPOSABLE) IMPLANT
GOWN STRL REUS W/TWL LRG LVL3 (GOWN DISPOSABLE) ×8
GOWN STRL REUS W/TWL XL LVL3 (GOWN DISPOSABLE) ×2
GRAFT PROPATEN W/RING 6X80X60 (Vascular Products) ×2 IMPLANT
INSERT FOGARTY SM (MISCELLANEOUS) ×2 IMPLANT
KIT BASIN OR (CUSTOM PROCEDURE TRAY) ×3 IMPLANT
KIT TURNOVER KIT B (KITS) ×3 IMPLANT
NS IRRIG 1000ML POUR BTL (IV SOLUTION) ×6 IMPLANT
PACK PERIPHERAL VASCULAR (CUSTOM PROCEDURE TRAY) ×3 IMPLANT
PAD ARMBOARD 7.5X6 YLW CONV (MISCELLANEOUS) ×6 IMPLANT
PADDING CAST COTTON 6X4 STRL (CAST SUPPLIES) ×2 IMPLANT
PATCH HEMASHIELD 8X75 (Vascular Products) ×2 IMPLANT
SPONGE LAP 18X18 RF (DISPOSABLE) ×2 IMPLANT
SUT ETHILON 3 0 PS 1 (SUTURE) IMPLANT
SUT PROLENE 5 0 C 1 24 (SUTURE) ×5 IMPLANT
SUT PROLENE 6 0 CC (SUTURE) ×15 IMPLANT
SUT SILK 2 0 SH (SUTURE) ×3 IMPLANT
SUT VIC AB 2-0 CTX 36 (SUTURE) ×6 IMPLANT
SUT VIC AB 3-0 SH 27 (SUTURE) ×4
SUT VIC AB 3-0 SH 27X BRD (SUTURE) ×2 IMPLANT
SUT VICRYL 4-0 PS2 18IN ABS (SUTURE) ×3 IMPLANT
TOWEL GREEN STERILE (TOWEL DISPOSABLE) ×3 IMPLANT
TRAY FOLEY MTR SLVR 16FR STAT (SET/KITS/TRAYS/PACK) ×3 IMPLANT
UNDERPAD 30X30 (UNDERPADS AND DIAPERS) ×3 IMPLANT
WATER STERILE IRR 1000ML POUR (IV SOLUTION) ×3 IMPLANT

## 2018-05-04 NOTE — Transfer of Care (Signed)
Immediate Anesthesia Transfer of Care Note  Patient: Jose Sellers  Procedure(s) Performed: LEFT FEMORAL TO ABOVE KNEE POPLITEAL BYPASS GRAFT USING 6MM X 80CM GORE PROPATEN RINGED GRAFT (Left ) LEFT FEMORAL ENDARTERECTOMY (Left ) Left Common Femoral/Profunda Patch Angioplasty using Hemashield Platinum Finesse Patch (Left )  Patient Location: PACU  Anesthesia Type:General  Level of Consciousness: awake, alert , patient cooperative and confused  Airway & Oxygen Therapy: Patient Spontanous Breathing and Patient connected to face mask oxygen  Post-op Assessment: Report given to RN, Post -op Vital signs reviewed and stable and Patient moving all extremities  Post vital signs: Reviewed and stable  Last Vitals:  Vitals Value Taken Time  BP 125/84 05/04/2018 12:17 PM  Temp 36.5 C 05/04/2018 12:17 PM  Pulse 60 05/04/2018 12:23 PM  Resp 19 05/04/2018 12:23 PM  SpO2 100 % 05/04/2018 12:23 PM  Vitals shown include unvalidated device data.  Last Pain:  Vitals:   05/04/18 0611  TempSrc:   PainSc: 6       Patients Stated Pain Goal: 4 (16/38/46 6599)  Complications: No apparent anesthesia complications

## 2018-05-04 NOTE — Op Note (Signed)
OPERATIVE REPORT  DATE OF SURGERY: 05/04/2018  PATIENT: Jose Sellers, 58 y.o. male MRN: [id number]  DOB: [birth year]  PRE-OPERATIVE DIAGNOSIS: Critical limb ischemia with nonhealing ulceration left great toe  POST-OPERATIVE DIAGNOSIS:  Same  PROCEDURE: Left femoral endarterectomy with Dacron patch angioplasty, left femoral to above-knee popliteal bypass with 6 mm propatent Gore-Tex graft  SURGEON:  Curt Jews, M.D.  PHYSICIAN ASSISTANT: Matt Eveland, PA-C  ANESTHESIA: General  EBL: per anesthesia record  Total I/O In: 750 [I.V.:500; IV Piggyback:250] Out: 320 [Urine:20; Blood:300]  BLOOD ADMINISTERED: none  DRAINS: none  SPECIMEN: none  COUNTS CORRECT:  YES  PATIENT DISPOSITION:  PACU - hemodynamically stable  PROCEDURE DETAILS: The patient was taken to the operating placed supine position with area of the left groin left leg prepped draped in usual sterile fashion.  Incision was made over the femoral artery.  Patient had extensive calcified femoral artery that was easily palpable through the skin.  There was minimal pulse present.  The artery was exposed up under the inguinal ligament and the external iliac artery was also extensively calcified but did have a pulse.  Tributary branches were controlled with 2-0 silk Potts ties.  Dissection was continued down onto the bifurcation.  The superficial femoral artery was chronically occluded at its origin.  There was a large profunda collateral arising posteriorly at the junction.  The deep femoral artery was extremely calcified and was dissected out into the multiple side branches which were also controlled with red Vesseloops.  The saphenous vein was imaged with SonoSite ultrasound.  The vein was moderate size in the thigh and quickly went directly posteriorly and there was no visualized saphenous vein throughout his thigh.  An incision was made the medial aspect of the above-knee popliteal Mosher.  The artery was extensively  Calcified.  A tunnel was created from the level of the above-knee popliteal to the groin.  Patient was given 8000 intravenous heparin.  After adequate circulation time the external iliac artery was occluded as was the superficial femoral and profundus femoral with their branches.  The profundus femoris artery was opened with 11 blade and sent longitudinally with Potts scissors onto the common femoral artery at the inguinal ligament.  The common femoral artery was endarterectomized up into the external iliac artery.  Superficial femoral artery origin was endarterectomized and the posterior deep femoral branch was endarterectomized with excellent inflow.  The profunda was endarterectomized down onto the multiple branches.  Several additional tacking sutures were used at the lower end of the fundus to tacked the plaque.  A Finesse Hemashield Dacron patch was brought onto the field and was sewn as a patch angioplasty with a running 6-0 Prolene suture.  This anastomosis was tested and found to be adequate.  Next a 6 mm ringed propatent Gore-Tex graft was brought onto the field.  A ellipse of graft was removed and the hood of the graft of the common femoral artery.  The 6 mm Gore-Tex graft was spatulated and sewn end-to-side to the Dacron patch with a running 6-0 Prolene suture.  This anastomosis was tested and found to be adequate.  The graft was then brought through the prior created tunnel down to the above-knee popliteal position.  A pneumatic tourniquet was placed on the thigh and the leg was elevated and exsanguinated with an Esmarch tourniquet and the pneumatic tourniquet was inflated.  The above-knee popliteal artery was opened with an 11 blade sent longitudinally with Potts scissors.  There was a  widely patent flow channel as was seen by arteriogram.  There was extensive calcification.  The Gore-Tex graft was cut to the appropriate length and was spatulated and sewn end-to-side to the above-knee popliteal artery  with a running 6-0 Prolene suture.  Prior to completion of the closure the pneumatic tourniquet was deflated and the usual flushing maneuvers were undertaken.  The anastomosis was then completed and flow was restored to the foot.  There was excellent Doppler flow in the graft dependent Doppler flow in the foot.  Patient was given 50 mg of protamine reverse heparin.  Wounds irrigated with saline.  Hemostasis electrocautery.  Wounds were closed with 2-0 Vicryl in the subcutaneous tissue and the groin and popliteal incision.  Skin was closed with 3-0 subcuticular Vicryl stitch.   [name], M.D., [facility] 05/04/2018 1:01 PM

## 2018-05-04 NOTE — Interval H&P Note (Signed)
History and Physical Interval Note:  05/04/2018 7:22 AM  Jose Sellers  has presented today for surgery, with the diagnosis of PERIPHERAL VASCULAR DISEASE  The various methods of treatment have been discussed with the patient and family. After consideration of risks, benefits and other options for treatment, the patient has consented to  Procedure(s): FEMORAL TO BELOW-THE-KNEE POPLITEAL BYPASS GRAFT (Left) FEMORAL ENDARTERECTOMY (Left) as a surgical intervention .  The patient's history has been reviewed, patient examined, no change in status, stable for surgery.  I have reviewed the patient's chart and labs.  Questions were answered to the patient's satisfaction.     Curt Jews

## 2018-05-04 NOTE — H&P (Signed)
Jose Sellers  PERIPHERAL VASCULAR CATHETERIZATION  Order# [id number]  Reading physician: Elam Dutch, MD Ordering physician: Elam Dutch, MD Study date: 04/20/18  Physicians   Panel Physicians Referring Physician Case Authorizing Physician  Elam Dutch, MD (Primary)    Procedures   ABDOMINAL AORTOGRAM W/LOWER EXTREMITY  Conclusion   Procedure: Abdominal aortogram with bilateral lower extremity runoff  Preoperative diagnosis: Nonhealing wound left foot  Postoperative diagnosis: Same  Anesthesia: Local  Operative findings: #1 severe calcification aortoiliac segments  2.  Bilateral greater than 70% common femoral artery stenosis  3.  Bilateral superficial femoral artery occlusions with reconstitution of a very diseased above-knee popliteal but healthier appearing below-knee popliteal artery  4.  One-vessel runoff anterior tibial artery  Operative details: After pain informed consent, patient taken the PV lab.  The patient was placed supine position on the Angie table.  Both groins were prepped and draped in usual sterile fashion.  Local anesthesia was infiltrated over the right common femoral artery.  Ultrasound was used to identify the right common femoral artery and femoral bifurcation.  An introducer needle was used to cannulate the right common femoral artery.  There was dense calcification and atherosclerotic plaque in the Common femoral artery.  Although I had good blood return for the needle I could get the not get the guidewire to advance.  Therefore the needle was removed and hemostasis obtained with direct pressure for about 3 minutes.  Again using ultrasound I cannulated the artery slightly higher.  I was able to get the guidewire to advance from this position.  A 5 French sheath placed over the guidewire into the right common femoral artery.  This was thoroughly flushed with heparinized saline.  A 5 French pigtail catheter was advanced over guidewire  in the abdominal aorta and abdominal aortogram was obtained in AP projection.  The infrarenal abdominal aorta is heavily calcified but patent.  The left common and external iliac artery is patent but again heavily calcified.  The left internal iliac artery is occluded.  The right common iliac artery is patent the right internal and external iliac arteries are heavily calcified but again patent with no flow-limiting stenosis.  Bilateral oblique views of the pelvis were obtained after bringing the pigtail catheter down just above the aortic bifurcation.  This was done with magnification.  This shows bilateral 70% stenosis of the common femoral arteries with calcific plaque.  At this point bilateral lower extremity runoff views were obtained through the pigtail catheter.  In the left lower extremity, the left common femoral artery has a 70% calcified stenosis.  The left superficial femoral artery occludes just after its origin.  The left profunda has a 50% stenosis at its origin and that is patent distally.  The profunda collaterals reconstitute a calcified above-knee popliteal artery the below-knee popliteal artery is a better healthier appearing vessel.  This is patent.  The posterior tibial and peroneal arteries are occluded.  There is one-vessel anterior tibial runoff all the way to the dorsalis pedis to the left foot.  In the right lower extremity, the right common femoral artery has a 70% calcified stenosis.  The right superficial femoral artery is patent proximally but occludes at the adductor hiatus.  The above-knee popliteal artery does reconstitute via collaterals but the skin is calcified and heavily diseased.  The below-knee popliteal artery is a healthy-appearing vessel.  There is one-vessel runoff via the anterior tibial artery to the right foot.  At this point the  pigtail catheter was removed over guidewire.  A few additional films were obtained through the sheath on the right side to  clarify the tibial pattern on the right side.  At this point the procedure was concluded.  The 5 French sheath was left in place to be pulled in the holding area.  The patient tolerated procedure well and there were no complications.  Operative management: The patient will be scheduled for a left femoral to below-knee popliteal bypass with left femoral endarterectomy in the near future.  He currently dialyzes on a Monday Wednesday and Friday.  We will try to look for a Tuesday or Thursday operative date for him.  Ruta Hinds, MD [facility] [place] Office: [phone] Pager: [phone]  Surgeon Notes   Addendum:  The patient has been re-examined and re-evaluated.  The patient's history and physical has been reviewed and is unchanged.    Jose Sellers is a 58 y.o. male is being admitted with PERIPHERAL VASCULAR DISEASE. All the risks, benefits and other treatment options have been discussed with the patient. The patient has consented to proceed with Procedure(s): FEMORAL TO BELOW-THE-KNEE POPLITEAL BYPASS GRAFT FEMORAL ENDARTERECTOMY as a surgical intervention.  Sherrilyn Nairn 05/04/2018 7:24 AM [facility]

## 2018-05-04 NOTE — Anesthesia Procedure Notes (Signed)
Arterial Line Insertion Start/End2/09/2018 7:03 AM, 05/04/2018 7:47 AM Performed by: Leonor Liv, CRNA, CRNA  Patient location: Pre-op. Preanesthetic checklist: patient identified, IV checked, site marked, risks and benefits discussed, surgical consent, monitors and equipment checked, pre-op evaluation, timeout performed and anesthesia consent Lidocaine 1% used for infiltration Left, radial was placed Catheter size: 20 G  Attempts: 1 Procedure performed without using ultrasound guided technique. Following insertion, Biopatch and dressing applied. Post procedure assessment: normal  Patient tolerated the procedure well with no immediate complications.

## 2018-05-04 NOTE — Progress Notes (Signed)
Magnet will need to be used for surgery per Heron Sabins ([place])  [phone].  He is available by phone if needed.

## 2018-05-04 NOTE — Anesthesia Procedure Notes (Signed)
Procedure Name: Intubation Date/Time: 05/04/2018 8:08 AM Performed by: Leonor Liv, CRNA Pre-anesthesia Checklist: Patient identified, Emergency Drugs available, Suction available and Patient being monitored Patient Re-evaluated:Patient Re-evaluated prior to induction Oxygen Delivery Method: Circle System Utilized Preoxygenation: Pre-oxygenation with 100% oxygen Induction Type: IV induction Ventilation: Mask ventilation without difficulty Laryngoscope Size: Mac and 4 Grade View: Grade I Tube type: Oral Tube size: 7.5 mm Number of attempts: 1 Airway Equipment and Method: Stylet and Oral airway Placement Confirmation: ETT inserted through vocal cords under direct vision,  positive ETCO2 and breath sounds checked- equal and bilateral Secured at: 23 cm Tube secured with: Tape Dental Injury: Teeth and Oropharynx as per pre-operative assessment

## 2018-05-04 NOTE — Consult Note (Signed)
[facility] Renal Consultation Note    Indication for Consultation:  Management of ESRD/hemodialysis; anemia, hypertension/volume and secondary hyperparathyroidism  UXN:ATFTDD, Clarita Crane., MD  HPI: Jose Sellers is a 58 y.o. male. 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  Past medical history significant for Type 2 DM, HTN, CAD (s/p CABG 2012), ICM (EF 30-35%, s/p ICM 2012) GERD, thrombocytopenia, PAD, hyperlipidemia, OSA on c-pap, and COPD.  He did PD for a while prior to changing back to HD due to an episode of pseudomonas peritonitis and C diff.  Of note he had a R AVF placed on 02/13/18 by Dr. Oneida Alar, which per his follow up note should be ready to cannulate on 05/08/2018.  Patient presents today for scheduled L femoral endarterectomy and L fem-pop bypass today by Dr. Donnetta Hutching and has been admitted for further management.   Seen and examined at bedside post procedure.  Reports procedure was tolerated well and pain is well controlled.  Denies SOB, CP, n/v/d, fever, chills and weakness.  Reports a productive cough w/beige sputum x 1 week.  No other complaints. CXR showed vascular congestion and small R pleural effusion.  Last HD was on 05/04/2018, full treatment completed, tolerated well using TDC, leaving slightly under his dry weight.   Past Medical History:  Diagnosis Date  . A-fib ([place])   . AICD (automatic cardioverter/defibrillator) present   . Anal fissure   . Anxiety   . Asthma   . CAD (coronary artery disease)   . CHF (congestive heart failure) ([place])   . COPD (chronic obstructive pulmonary disease) ([place])   . Degeneration of lumbar or lumbosacral intervertebral disc   . DM (diabetes mellitus), type 2, uncontrolled w/neurologic complication ([place])   . Dyspnea    with exertion  . Dysthymic disorder   . End stage renal disease on dialysis [facility])    dialysis M-W-F   [address] in [place]  . Essential (primary) hypertension   .  GERD (gastroesophageal reflux disease)   . History of hiatal hernia   . Hx of colonic polyps   . Hyperlipemia   . Myocardial infarction ([place])   . Peripheral vascular disease ([place])   . Pneumonia   . Recurrent major depression ([place])   . Sleep apnea   . Ventral hernia without obstruction or gangrene   . Wears dentures    Past Surgical History:  Procedure Laterality Date  . ABDOMINAL AORTOGRAM W/LOWER EXTREMITY N/A 04/20/2018   Procedure: ABDOMINAL AORTOGRAM W/LOWER EXTREMITY;  Surgeon: Elam Dutch, MD;  Location: [facility];  Service: Cardiovascular;  Laterality: N/A;  . AV FISTULA PLACEMENT Right 02/13/2018   Procedure: CREATION OF RADIOCEPHALIC ARTERIOVENOUS FISTULA RIGHT ARM;  Surgeon: Elam Dutch, MD;  Location: [facility];  Service: Vascular;  Laterality: Right;  . CAD s/p cabbage    . CARDIAC DEFIBRILLATOR PLACEMENT  2012  . CATARACT EXTRACTION W/ INTRAOCULAR LENS IMPLANT    . COLONOSCOPY  12/27/2013   Colonic polyps status post polypectomy.  Mild sigmoid diverticulosis. Internal hemorrhoids. Tubular adenoma.   . CORONARY ANGIOPLASTY    . CORONARY STENT PLACEMENT    . ESOPHAGOGASTRODUODENOSCOPY  12/01/2016   Mild gastritis.   . heart bypass    . HERNIA REPAIR    . KNEE SURGERY     Family History  Problem Relation Age of Onset  . Colon cancer Neg Hx    Social History:  reports that he has been  smoking cigarettes. He has a 8.75 pack-year smoking history. He has never used smokeless tobacco. He reports previous alcohol use. He reports that he does not use drugs. Allergies  Allergen Reactions  . Albuterol Palpitations    SVT  . Prednisone Shortness Of Breath    Wife stated that patient develops breathing issues.  . Fluticasone Furoate-Vilanterol Palpitations  . Morphine Rash   Prior to Admission medications   Medication Sig Start Date End Date Taking? Authorizing Provider  amiodarone (PACERONE) 200 MG tablet Take 200 mg by mouth 2 (two) times daily.    Yes  [provider]  aspirin 81 MG tablet Take 81 mg by mouth daily.  01/20/11  Yes [provider]  atorvastatin (LIPITOR) 40 MG tablet Take 40 mg by mouth 2 (two) times daily.    Yes [provider]  B Complex-C-Folic Acid (DIALYVITE 948) 0.8 MG TABS Take 1 tablet by mouth daily.  12/19/17  Yes [provider]  calcitRIOL (ROCALTROL) 0.25 MCG capsule Take 0.25 mcg by mouth every Monday, Wednesday, and Friday with hemodialysis.    Yes [provider]  esomeprazole (NEXIUM) 40 MG capsule Take 40 mg by mouth daily before breakfast.    Yes [provider]  ferric citrate (AURYXIA) 1 GM 210 MG(Fe) tablet Take 420 mg by mouth 3 (three) times daily with meals.    Yes [provider]  FLUoxetine (PROZAC) 20 MG capsule Take 20 mg by mouth daily.  11/07/17  Yes [provider]  furosemide (LASIX) 40 MG tablet Take 40 mg by mouth daily as needed for fluid or edema.    Yes [provider]  gabapentin (NEURONTIN) 300 MG capsule Take 1 capsule (300 mg total) by mouth 3 (three) times daily. Patient taking differently: Take 300 mg by mouth at bedtime.  03/14/18  Yes Stover, Titorya, DPM  Insulin Glargine (LANTUS SOLOSTAR) 100 UNIT/ML Solostar Pen Inject 10 Units into the skin at bedtime.    Yes [provider]  insulin lispro (HUMALOG KWIKPEN) 100 UNIT/ML KwikPen Inject 0-32 Units into the skin 3 (three) times daily. Sliding Scale Insulin    Yes [provider]  levalbuterol (XOPENEX) 0.63 MG/3ML nebulizer solution Inhale 0.63 mg into the lungs every 8 (eight) hours as needed for wheezing or shortness of breath.  10/09/17  Yes [provider]  lidocaine-prilocaine (EMLA) cream Apply 1 application topically See admin instructions. Apply small amount to access site 1-2 hours prior to dialysis. 01/29/18  Yes [provider]  metoprolol tartrate (LOPRESSOR) 25 MG tablet Take 12.5 mg by mouth 2 (two) times daily.   03/10/18  Yes [provider]  oxycodone (ROXICODONE) 30 MG immediate release tablet Take 30 mg by mouth every 8 (eight) hours as needed for pain.    Yes [provider]  oxyCODONE-acetaminophen (PERCOCET) 7.5-325 MG tablet Take 1 tablet by mouth every 6 (six) hours as needed for severe pain.    Yes [provider]  SANTYL ointment Apply 1 application topically daily.  04/16/18  Yes [provider]  tiotropium (SPIRIVA) 18 MCG inhalation capsule Place 18 mcg into inhaler and inhale daily.  03/10/18  Yes [provider]  acetaminophen-codeine (TYLENOL #3) 300-30 MG tablet Take 1-2 tablets by mouth every 6 (six) hours as needed for severe pain. Patient not taking: Reported on 04/17/2018 02/13/18   Elam Dutch, MD  nitroGLYCERIN (NITROSTAT) 0.4 MG SL tablet Place 0.4 mg under the tongue every 5 (five) minutes as needed  for chest pain.    [provider]   Current Facility-Administered Medications  Medication Dose Route Frequency Provider Last Rate Last Dose  . 0.9 %  sodium chloride infusion  500 mL Intravenous Once PRN Dagoberto Ligas, PA-C      . 0.9 %  sodium chloride infusion   Intravenous Continuous Dagoberto Ligas, PA-C      . [START ON 05/05/2018] Chlorhexidine Gluconate Cloth 2 % PADS 6 each  6 each Topical Q0600 Nasira Janusz, PA      . hydrALAZINE (APRESOLINE) injection 5 mg  5 mg Intravenous Q20 Min PRN Dagoberto Ligas, PA-C      . HYDROmorphone (DILAUDID) injection 0.5-1 mg  0.5-1 mg Intravenous Q2H PRN Dagoberto Ligas, PA-C      . labetalol (NORMODYNE,TRANDATE) injection 10 mg  10 mg Intravenous Q10 min PRN Dagoberto Ligas, PA-C      . mupirocin ointment (BACTROBAN) 2 % 1 application  1 application Topical BID Dagoberto Ligas, PA-C      . ondansetron (ZOFRAN) injection 4 mg  4 mg Intravenous Q6H PRN Dagoberto Ligas, PA-C       Labs: Basic Metabolic Panel: Recent Labs  Lab 05/01/18 1146 05/04/18 0630  NA 134* 132*   K 4.3 4.0  CL 94*  --   CO2 25  --   GLUCOSE 154* 151*  BUN 31*  --   CREATININE 5.54*  --   CALCIUM 9.4  --    Liver Function Tests: Recent Labs  Lab 05/01/18 1146  AST 24  ALT 18  ALKPHOS 123  BILITOT 1.5*  PROT 6.5  ALBUMIN 3.0*   CBC: Recent Labs  Lab 05/01/18 1146 05/04/18 0630  WBC 4.7  --   HGB 11.9* 13.6  HCT 40.2 40.0  MCV 88.0  --   PLT 122*  --    CBG: Recent Labs  Lab 05/01/18 1021 05/04/18 0552 05/04/18 1217  GLUCAP 158* 148* 115*   Studies/Results: Dg Chest 2 View  Result Date: 05/04/2018 CLINICAL DATA:  Preop x-ray.  Cough. EXAM: CHEST - 2 VIEW COMPARISON:  03/04/2018 FINDINGS: Cardiomegaly and vascular congestion with small right pleural effusion. There is increased density over the lower spine in the lateral projection that is likely related to the pleural fluid and atelectasis. Dialysis catheter with tip at the right atrium. Dual-chamber ICD/pacer leads from the left. IMPRESSION: Cardiomegaly and vascular congestion with trace right pleural effusion. Asymmetric right base density is likely from the pleural fluid and associated atelectasis, assuming no infectious symptoms. Electronically Signed   By: Monte Fantasia M.D.   On: 05/04/2018 06:40    ROS: All others negative except those listed in HPI.  Physical Exam: Vitals:   05/04/18 1232 05/04/18 1247 05/04/18 1315 05/04/18 1330  BP: (!) 144/92 136/71 125/66 125/67  Pulse: 60 (!) 59 60 (!) 56  Resp: (!) 21 17 19 16   Temp:   (!) 97.5 F (36.4 C)   TempSrc:      SpO2: 100% 99% 96% 96%  Weight:      Height:         General: WDWN, NAD, pleasant male, laying in bed Head: NCAT sclera not icteric MMM Neck: Supple. No lymphadenopathy, no JVD appreciated Lungs: +diffuse rhonchi, no wheeze or rales appreciated. Breathing is unlabored. Heart: RRR. No murmur, rubs or gallops.  Abdomen: soft, nontender, +BS, no guarding, no rebound tenderness Lower extremities:1+ edema b/l, +chronic skin changes  b/l, L great toe bandaged Neuro: AAOx3. Moves all extremities spontaneously.  Psych:  Responds to questions appropriately with a normal affect. Dialysis Access: R IJ TDC, RU AVF maturing  Dialysis Orders:  MWF TTS - [facility]  4hrs, BFR 450, DFR AF1.5,  EDW 80kg, 2K/ 2.25Ca  Access: R IJ TDC, RU AVF maturing  Heparin none Calcitriol 0.5 mcg PO qHD Venofer 50mg  qwk  Assessment/Plan: 1.  PVD with non healing L great toe wound - s/p  L femoral endarterectomy and L fem-pop bypass today by Dr. Donnetta Hutching. Per [facility] 2.  ESRD -  On HD MWF.  K 4.0.  Orders written for HD today per regular schedule using TDC.   3.  Hypertension/volume  - BP currently well controlled.  CXR showed vascular congestion and LE edema noted on exam.  2L over EDW according to weight this AM. Plan for net UF goal 3L.  May need lowering of EDW as he did leave slightly below on last HD. Will titrate down as tolerated.  4.  Anemia of CKD - Hgb 13.6. No indication for ESA at this time.  5.  Secondary Hyperparathyroidism -  Ca in goal. Will check phos. Continue VDRA and binders.  6. Nutrition - Renal diet w/fluid restrictions once advanced.  Protein supplements.  7. DMT2 - per primary 8. ICM 9. Thrombocytopenia 10. OSA   Jen Mow, PA-C [facility] Pager: [phone] 05/04/2018, 2:16 PM

## 2018-05-04 NOTE — Progress Notes (Signed)
Patient came in today for surgery with c/o cough with productive sputum that is white/brown in color.  Patient said that it hasnt gotten any worse but it has been going on for the last 2 months.  No fever at this time.  Notified Dr. Jenita Seashore who gave verbal order for stat chest xray.

## 2018-05-05 LAB — CBC
HCT: 35.4 % — ABNORMAL LOW (ref 39.0–52.0)
Hemoglobin: 11 g/dL — ABNORMAL LOW (ref 13.0–17.0)
MCH: 26.6 pg (ref 26.0–34.0)
MCHC: 31.1 g/dL (ref 30.0–36.0)
MCV: 85.5 fL (ref 80.0–100.0)
PLATELETS: 131 10*3/uL — AB (ref 150–400)
RBC: 4.14 MIL/uL — ABNORMAL LOW (ref 4.22–5.81)
RDW: 15.9 % — ABNORMAL HIGH (ref 11.5–15.5)
WBC: 8.4 10*3/uL (ref 4.0–10.5)
nRBC: 0 % (ref 0.0–0.2)

## 2018-05-05 LAB — BASIC METABOLIC PANEL
Anion gap: 14 (ref 5–15)
BUN: 19 mg/dL (ref 6–20)
CO2: 25 mmol/L (ref 22–32)
Calcium: 8.7 mg/dL — ABNORMAL LOW (ref 8.9–10.3)
Chloride: 97 mmol/L — ABNORMAL LOW (ref 98–111)
Creatinine, Ser: 4.54 mg/dL — ABNORMAL HIGH (ref 0.61–1.24)
GFR calc Af Amer: 16 mL/min — ABNORMAL LOW (ref >60)
GFR, EST NON AFRICAN AMERICAN: 13 mL/min — AB (ref >60)
Glucose, Bld: 92 mg/dL (ref 70–99)
Potassium: 4 mmol/L (ref 3.5–5.1)
Sodium: 136 mmol/L (ref 135–145)

## 2018-05-05 LAB — GLUCOSE, CAPILLARY
GLUCOSE-CAPILLARY: 79 mg/dL (ref 70–99)
GLUCOSE-CAPILLARY: 129 mg/dL — AB (ref 70–99)
Glucose-Capillary: 110 mg/dL — ABNORMAL HIGH (ref 70–99)
Glucose-Capillary: 67 mg/dL — ABNORMAL LOW (ref 70–99)
Glucose-Capillary: 98 mg/dL (ref 70–99)
Glucose-Capillary: 107 mg/dL — ABNORMAL HIGH (ref 70–99)

## 2018-05-05 NOTE — Progress Notes (Addendum)
  Progress Note    05/05/2018 9:04 AM 1 Day Post-Op  Subjective:  Patient believes L foot feels better since surgery   Vitals:   05/05/18 0100 05/05/18 0803  BP:  119/72  Pulse:  60  Resp: 17 16  Temp:  98.2 F (36.8 C)  SpO2:  96%   Physical Exam: Lungs:  Non labored Incisions:  L groin and AK pop incision c/d/i without palpable hematoma Extremities:  L DP signal by doppler Neurologic: A&O  CBC    Component Value Date/Time   WBC 4.5 05/04/2018 1501   RBC 3.71 (L) 05/04/2018 1501   HGB 9.9 (L) 05/04/2018 1501   HCT 32.1 (L) 05/04/2018 1501   PLT 112 (L) 05/04/2018 1501   MCV 86.5 05/04/2018 1501   MCH 26.7 05/04/2018 1501   MCHC 30.8 05/04/2018 1501   RDW 15.9 (H) 05/04/2018 1501    BMET    Component Value Date/Time   NA 136 05/04/2018 1700   K 4.6 05/04/2018 1700   CL 97 (L) 05/04/2018 1700   CO2 24 05/04/2018 1700   GLUCOSE 148 (H) 05/04/2018 1700   BUN 36 (H) 05/04/2018 1700   CREATININE 6.33 (H) 05/04/2018 1700   CALCIUM 8.6 (L) 05/04/2018 1700   GFRNONAA 9 (L) 05/04/2018 1700   GFRAA 10 (L) 05/04/2018 1700    INR    Component Value Date/Time   INR 1.23 05/01/2018 1146     Intake/Output Summary (Last 24 hours) at 05/05/2018 3716 Last data filed at 05/05/2018 0600 Gross per 24 hour  Intake 1790.03 ml  Output 3370 ml  Net -1579.97 ml     Assessment/Plan:  58 y.o. male is s/p L CFA and profunda endarterectomy with patch angioplasty and femoral to AK popliteal bypass with PTFE  1 Day Post-Op   Brisk DP signal by doppler Ok to d/c A line Santyl dressings L GT daily Encouraged OOB today D/c home when pain controlled and mobility increased   Dagoberto Ligas, PA-C [facility] [phone] 05/05/2018 9:04 AM   I have seen and evaluated the patient. I agree with the PA note as documented above. POD#1 s/p L CFA to AK pop bypass.  Brisk DP signal this am.  States foot feels better.  D/C a line.  OOB pain.  Mobility and pain  control.  Marty Heck, MD [facility] [place] Office: [phone] Pager: [phone]

## 2018-05-05 NOTE — Progress Notes (Signed)
[facility] Progress Note   Subjective:   Seen and examined at bedside.  Pain well controlled.  HD tolerated well yesterday.  Continues to have productive cough, no worse in severity.  Denies CP, SOB, weakness, n/v/d and fatigue.    Objective Vitals:   05/04/18 2300 05/05/18 0000 05/05/18 0100 05/05/18 0803  BP:    119/72  Pulse:    60  Resp: (!) 24 20 17 16   Temp:    98.2 F (36.8 C)  TempSrc:    Oral  SpO2:    96%  Weight:      Height:       Physical Exam General:NAD, pleasant male sitting in bedside chair Heart:RRR, no mrg Lungs:+rhonchi in LUL, faint crackles in RLL, nml WOB Abdomen:soft, NTND Extremities: 1+LE edema b/l, +chronic skin changes, L great toe bandaged Dialysis Access: R IJ TDC, RU AVF +b/t   Filed Weights   05/04/18 1616 05/04/18 2016 05/04/18 2059  Weight: 82 kg 79 kg 78.9 kg    Intake/Output Summary (Last 24 hours) at 05/05/2018 1048 Last data filed at 05/05/2018 0600 Gross per 24 hour  Intake 1790.03 ml  Output 3255 ml  Net -1464.97 ml    Additional Objective Labs: Basic Metabolic Panel: Recent Labs  Lab 05/01/18 1146 05/04/18 0630 05/04/18 1501 05/04/18 1700 05/05/18 0836  NA 134* 132*  --  136 136  K 4.3 4.0  --  4.6 4.0  CL 94*  --   --  97* 97*  CO2 25  --   --  24 25  GLUCOSE 154* 151*  --  148* 92  BUN 31*  --   --  36* 19  CREATININE 5.54*  --  5.74* 6.33* 4.54*  CALCIUM 9.4  --   --  8.6* 8.7*  PHOS  --   --   --  6.0*  --    Liver Function Tests: Recent Labs  Lab 05/01/18 1146 05/04/18 1700  AST 24  --   ALT 18  --   ALKPHOS 123  --   BILITOT 1.5*  --   PROT 6.5  --   ALBUMIN 3.0* 2.6*   CBC: Recent Labs  Lab 05/01/18 1146 05/04/18 0630 05/04/18 1501 05/05/18 0836  WBC 4.7  --  4.5 8.4  HGB 11.9* 13.6 9.9* 11.0*  HCT 40.2 40.0 32.1* 35.4*  MCV 88.0  --  86.5 85.5  PLT 122*  --  112* 131*   CBG: Recent Labs  Lab 05/04/18 0552 05/04/18 1217 05/04/18 1527 05/04/18 2145 05/05/18 0606   GLUCAP 148* 115* 122* 155* 110*   Studies/Results: Dg Chest 2 View  Result Date: 05/04/2018 CLINICAL DATA:  Preop x-ray.  Cough. EXAM: CHEST - 2 VIEW COMPARISON:  03/04/2018 FINDINGS: Cardiomegaly and vascular congestion with small right pleural effusion. There is increased density over the lower spine in the lateral projection that is likely related to the pleural fluid and atelectasis. Dialysis catheter with tip at the right atrium. Dual-chamber ICD/pacer leads from the left. IMPRESSION: Cardiomegaly and vascular congestion with trace right pleural effusion. Asymmetric right base density is likely from the pleural fluid and associated atelectasis, assuming no infectious symptoms. Electronically Signed   By: Monte Fantasia M.D.   On: 05/04/2018 06:40    Medications: . sodium chloride     . amiodarone  200 mg Oral BID  . aspirin EC  81 mg Oral Daily  . atorvastatin  40 mg Oral q1800  . [START ON 05/07/2018]  calcitRIOL  0.5 mcg Oral Q M,W,F-HD  . Chlorhexidine Gluconate Cloth  6 each Topical Q0600  . docusate sodium  100 mg Oral Daily  . ferric citrate  420 mg Oral TID WC  . FLUoxetine  20 mg Oral Daily  . gabapentin  300 mg Oral QHS  . heparin  5,000 Units Subcutaneous Q8H  . insulin aspart  0-15 Units Subcutaneous TID WC  . insulin glargine  10 Units Subcutaneous QHS  . metoprolol tartrate  12.5 mg Oral BID  . multivitamin  1 tablet Oral QHS  . mupirocin ointment  1 application Topical BID  . pantoprazole  40 mg Oral Daily    Dialysis Orders: MWF - [facility]  4hrs, BFR 450, DFR AF1.5,  EDW 80kg, 2K/ 2.25Ca  Access: R IJ TDC, RU AVF maturing  Heparin none Calcitriol 0.5 mcg PO qHD Venofer 50mg  qwk  Assessment/Plan: 1.  PVD with non healing L great toe wound - s/p L femoral endarterectomy and L fem-pop bypass on 2/7 by Dr. Donnetta Hutching. Per VVS 2.  ESRD -  On HD MWF. K 4.0. HD yesterday, tolerated well with net UF 3L removed.  Next HD on 2/10 per regular scheudle 3.   Hypertension/volume  - BP currently well controlled.  Under EDW post HD.  Continues to have LE edema, but volume stable. Will likely need dry lowered at d/c.  Was under as OP last HD as well.  4.  Anemia of CKD - Hgb 11.0. No indication for ESA at this time. Follow trends. 5.  Secondary Hyperparathyroidism -  Ca in goal. Phos elevated. Continue VDRA and binders.  6. Nutrition - Renal diet w/fluid restrictions once advanced.  Protein supplements.  7. DMT2 - per primary 8. ICM 9. Thrombocytopenia 10. OSA  Jen Mow, PA-C [facility] Pager: [phone] 05/05/2018,10:48 AM  LOS: 1 day

## 2018-05-05 NOTE — Evaluation (Signed)
Physical Therapy Evaluation Patient Details Name: Jose Sellers MRN: [id number] DOB: [birth year] Today's Date: 05/05/2018   History of Present Illness  58 y.o. male is s/p L CFA and profunda endarterectomy with patch angioplasty and femoral to [place] popliteal bypass with PTFE.    Clinical Impression  Pt admitted with above diagnosis. Pt currently with functional limitations due to the deficits listed below (see PT Problem List). On eval, pt required min guard assist transfers and ambulation 180 feet with RW. He has 10 steps with R/L rails to enter his house. Pt will benefit from skilled PT to increase their independence and safety with mobility to allow discharge to the venue listed below.  PT to follow acutely. No follow up services indicated.      Follow Up Recommendations No PT follow up;Supervision for mobility/OOB    Equipment Recommendations  None recommended by PT    Recommendations for Other Services       Precautions / Restrictions Precautions Precautions: Fall      Mobility  Bed Mobility Overal bed mobility: Modified Independent             General bed mobility comments: increased time  Transfers Overall transfer level: Needs assistance Equipment used: Rolling walker (2 wheeled) Transfers: Sit to/from Stand Sit to Stand: Min guard         General transfer comment: cues for hand placement, min guard assist for safety  Ambulation/Gait Ambulation/Gait assistance: Min guard Gait Distance (Feet): 180 Feet Assistive device: Rolling walker (2 wheeled) Gait Pattern/deviations: Step-through pattern;Decreased stride length Gait velocity: decreased Gait velocity interpretation: 1.31 - 2.62 ft/sec, indicative of limited community ambulator General Gait Details: distance limited by pain, no LOB noted   Stairs            Wheelchair Mobility    Modified Rankin (Stroke Patients Only)       Balance Overall balance assessment: Mild deficits observed, not  formally tested                                           Pertinent Vitals/Pain Pain Assessment: 0-10 Pain Score: 6  Pain Location: L LE Pain Descriptors / Indicators: Sore Pain Intervention(s): Monitored during session;Repositioned;Patient requesting pain meds-RN notified    Home Living Family/patient expects to be discharged to:: Private residence Living Arrangements: Spouse/significant other Available Help at Discharge: Family;Available 24 hours/day Type of Home: House Home Access: Stairs to enter Entrance Stairs-Rails: Psychiatric nurse of Steps: 10 Home Layout: One level Home Equipment: Clinical cytogeneticist - 2 wheels;Cane - single point;Grab bars - tub/shower      Prior Function Level of Independence: Independent with assistive device(s)   Gait / Transfers Assistance Needed: pt reports that he uses a cane mainly, but had been using RW more recently due to LLE pain.  ADL's / Homemaking Assistance Needed: indpendent with ADLs/selfcare, driving        Hand Dominance   Dominant Hand: Right    Extremity/Trunk Assessment   Upper Extremity Assessment Upper Extremity Assessment: Generalized weakness    Lower Extremity Assessment Lower Extremity Assessment: Generalized weakness    Cervical / Trunk Assessment Cervical / Trunk Assessment: Kyphotic(h/o chronic back pain due to injury)  Communication   Communication: No difficulties  Cognition Arousal/Alertness: Awake/alert Behavior During Therapy: WFL for tasks assessed/performed Overall Cognitive Status: Within Functional Limits for tasks assessed  General Comments General comments (skin integrity, edema, etc.): VSS    Exercises     Assessment/Plan    PT Assessment Patient needs continued PT services  PT Problem List Decreased strength;Decreased balance;Pain;Decreased mobility;Decreased activity tolerance       PT  Treatment Interventions Functional mobility training;Balance training;Patient/family education;Gait training;Therapeutic activities;Stair training;Therapeutic exercise    PT Goals (Current goals can be found in the Care Plan section)  Acute Rehab PT Goals Patient Stated Goal: go home PT Goal Formulation: With patient/family Time For Goal Achievement: 05/19/18 Potential to Achieve Goals: Good    Frequency Min 3X/week   Barriers to discharge        Co-evaluation               AM-PAC PT "6 Clicks" Mobility  Outcome Measure Help needed turning from your back to your side while in a flat bed without using bedrails?: None Help needed moving from lying on your back to sitting on the side of a flat bed without using bedrails?: None Help needed moving to and from a bed to a chair (including a wheelchair)?: A Little Help needed standing up from a chair using your arms (e.g., wheelchair or bedside chair)?: None Help needed to walk in hospital room?: A Little Help needed climbing 3-5 steps with a railing? : A Little 6 Click Score: 21    End of Session Equipment Utilized During Treatment: Gait belt Activity Tolerance: Patient tolerated treatment well Patient left: in chair;with call bell/phone within reach;with family/visitor present Nurse Communication: Mobility status;Patient requests pain meds PT Visit Diagnosis: Muscle weakness (generalized) (M62.81);Difficulty in walking, not elsewhere classified (R26.2);Pain Pain - Right/Left: Left Pain - part of body: Leg    Time: 3825-0539 PT Time Calculation (min) (ACUTE ONLY): 14 min   Charges:   PT Evaluation $PT Eval Low Complexity: 1 Low          Lorrin Goodell, PT  Office # [phone] Pager [phone]   Lorriane Shire 05/05/2018, 12:00 PM

## 2018-05-05 NOTE — Evaluation (Signed)
Occupational Therapy Evaluation Patient Details Name: Jose Sellers MRN: [id number] DOB: 23-Jan-1961 Today's Date: 05/05/2018    History of Present Illness 58 y.o. male is s/p L CFA and profunda endarterectomy with patch angioplasty and femoral to [place] popliteal bypass with PTFE   Clinical Impression   Pt with decline in function and safety with ADLs and ADL mobility with decreased strength, balance ane endurance. PTA, pt lived at home with his wife and was independent with ADLs/selfcare, was driving, used cane for mobility but reports that he used RW more recently. Pt would benefit from acute OT services to address impairments to maximize level of function and safety    Follow Up Recommendations  No OT follow up;Supervision - Intermittent    Equipment Recommendations  None recommended by OT    Recommendations for Other Services       Precautions / Restrictions Precautions Precautions: Fall      Mobility Bed Mobility Overal bed mobility: Modified Independent             General bed mobility comments: increased time  Transfers Overall transfer level: Needs assistance Equipment used: Rolling walker (2 wheeled) Transfers: Sit to/from Stand Sit to Stand: Min guard         General transfer comment: cues for correct hand placement    Balance Overall balance assessment: Mild deficits observed, not formally tested                                         ADL either performed or assessed with clinical judgement   ADL Overall ADL's : Needs assistance/impaired Eating/Feeding: Independent;Sitting   Grooming: Wash/dry hands;Wash/dry face;Standing;Supervision/safety;Set up;With caregiver independent assisting   Upper Body Bathing: Set up;With caregiver independent assisting   Lower Body Bathing: Moderate assistance;With caregiver independent assisting   Upper Body Dressing : Set up;With caregiver independent assisting   Lower Body Dressing: Moderate  assistance;With caregiver independent assisting   Toilet Transfer: Min guard;Ambulation;RW   Toileting- Water quality scientist and Hygiene: Min guard;Sit to/from stand   Tub/ Shower Transfer: Min guard;Ambulation;Rolling walker;3 in 1;Grab bars   Functional mobility during ADLs: Min guard;Rolling walker;Cueing for safety       Vision Baseline Vision/History: Wears glasses Wears Glasses: Reading only Patient Visual Report: No change from baseline       Perception     Praxis      Pertinent Vitals/Pain Pain Assessment: 0-10 Pain Score: 6  Pain Location: L LE Pain Descriptors / Indicators: Sore Pain Intervention(s): Monitored during session;Patient requesting pain meds-RN notified     Hand Dominance Right   Extremity/Trunk Assessment Upper Extremity Assessment Upper Extremity Assessment: Generalized weakness   Lower Extremity Assessment Lower Extremity Assessment: Defer to PT evaluation       Communication Communication Communication: No difficulties   Cognition Arousal/Alertness: Awake/alert Behavior During Therapy: WFL for tasks assessed/performed Overall Cognitive Status: Within Functional Limits for tasks assessed                                     General Comments       Exercises     Shoulder Instructions      Home Living Family/patient expects to be discharged to:: Private residence Living Arrangements: Spouse/significant other Available Help at Discharge: Family;Available 24 hours/day Type of Home: House Home Access: Stairs to enter  Entrance Stairs-Number of Steps: 10 Entrance Stairs-Rails: Right;Left Home Layout: One level         Bathroom Toilet: Standard Bathroom Accessibility: Yes   Home Equipment: Clinical cytogeneticist - 2 wheels;Cane - single point;Grab bars - tub/shower          Prior Functioning/Environment Level of Independence: Independent with assistive device(s)  Gait / Transfers Assistance Needed: pt reports  that he uses a cane mainly, but had been using RW more recently ADL's / Homemaking Assistance Needed: indpendent with ADLs/selfcare, driving            OT Problem List: Decreased strength;Decreased activity tolerance;Decreased knowledge of use of DME or AE;Impaired balance (sitting and/or standing);Pain      OT Treatment/Interventions: Self-care/ADL training;DME and/or AE instruction;Therapeutic activities;Patient/family education    OT Goals(Current goals can be found in the care plan section) Acute Rehab OT Goals Patient Stated Goal: go home OT Goal Formulation: With patient/family Time For Goal Achievement: 05/19/18 Potential to Achieve Goals: Good ADL Goals Pt Will Perform Grooming: with set-up;with supervision;standing;with caregiver independent in assisting Pt Will Perform Lower Body Bathing: with min assist;with caregiver independent in assisting Pt Will Perform Lower Body Dressing: with min assist;with caregiver independent in assisting Pt Will Transfer to Toilet: with supervision;with modified independence;ambulating Pt Will Perform Toileting - Clothing Manipulation and hygiene: with supervision;sit to/from stand;with caregiver independent in assisting Pt Will Perform Tub/Shower Transfer: with supervision;with modified independence;ambulating;rolling walker;shower seat;3 in 1;with caregiver independent in assisting  OT Frequency: Min 2X/week   Barriers to D/C:    no barriers       Co-evaluation              AM-PAC OT "6 Clicks" Daily Activity     Outcome Measure Help from another person eating meals?: None Help from another person taking care of personal grooming?: A Little Help from another person toileting, which includes using toliet, bedpan, or urinal?: A Little Help from another person bathing (including washing, rinsing, drying)?: A Lot Help from another person to put on and taking off regular upper body clothing?: None Help from another person to put on  and taking off regular lower body clothing?: A Lot 6 Click Score: 18   End of Session Equipment Utilized During Treatment: Gait belt;Rolling walker  Activity Tolerance: Patient tolerated treatment well Patient left: Other (comment);with family/visitor present(ambulating with PT)  OT Visit Diagnosis: Unsteadiness on feet (R26.81);Other abnormalities of gait and mobility (R26.89);Muscle weakness (generalized) (M62.81);History of falling (Z91.81);Pain Pain - Right/Left: Left Pain - part of body: Leg                Time: 5397-6734 OT Time Calculation (min): 20 min Charges:  OT General Charges $OT Visit: 1 Visit OT Evaluation $OT Eval Moderate Complexity: 1 Mod    Britt Bottom 05/05/2018, 11:08 AM

## 2018-05-05 NOTE — Progress Notes (Signed)
A-line removed per order.  Pt tolerated procedure well.  Performed wound care to L great toe with Santyl per order

## 2018-05-06 LAB — GLUCOSE, CAPILLARY
Glucose-Capillary: 94 mg/dL (ref 70–99)
Glucose-Capillary: 132 mg/dL — ABNORMAL HIGH (ref 70–99)

## 2018-05-06 MED ORDER — LEVALBUTEROL HCL 0.63 MG/3ML IN NEBU
0.6300 mg | INHALATION_SOLUTION | Freq: Once | RESPIRATORY_TRACT | Status: AC
  Administered 2018-05-06: 0.63 mg via RESPIRATORY_TRACT
  Filled 2018-05-06: qty 3

## 2018-05-06 MED ORDER — LEVALBUTEROL TARTRATE 45 MCG/ACT IN AERO: 1.0000 | INHALATION_SPRAY | Freq: Once | RESPIRATORY_TRACT | Status: DC

## 2018-05-06 MED ORDER — OXYCODONE-ACETAMINOPHEN 7.5-325 MG PO TABS: 1.0000 | ORAL_TABLET | Freq: Four times a day (QID) | ORAL | 0 refills | Status: AC | PRN

## 2018-05-06 MED ORDER — ALBUTEROL SULFATE (2.5 MG/3ML) 0.083% IN NEBU
INHALATION_SOLUTION | RESPIRATORY_TRACT | Status: AC
  Filled 2018-05-06: qty 3

## 2018-05-06 NOTE — Progress Notes (Addendum)
[facility] Progress Note   Subjective:   Seen and examined at bedside, with wife present.  Wanting to go home today.  Leg pain well managed.  Continues to have some LE edema, worse on L as expected post procedure.  Reports SOB with increased wheezing this AM, typically will use nebulizer at home for this.  Denies CP, n/v/d, abdominal pain, dizziness and weakness.   Objective Vitals:   05/06/18 0248 05/06/18 0700 05/06/18 0836 05/06/18 0843  BP: 128/68   120/73  Pulse: 65   68  Resp: 19 (!) 23 20 17   Temp: 99.1 F (37.3 C)   98.6 F (37 C)  TempSrc: Oral   Oral  SpO2: 97%   95%  Weight:  83.2 kg    Height:       Physical Exam General:NAD, pleasant male, sitting in bedside chair.  Heart:RRR, no mrg Lungs:+diffuse wheezing throughout, audible wheezing, nml WOB Abdomen:soft, NTND Extremities:1+ LE edema L>R, +chronic skin changes Dialysis Access: R Ij TDC, RU AVF +b/t   Filed Weights   05/04/18 2016 05/04/18 2059 05/06/18 0700  Weight: 79 kg 78.9 kg 83.2 kg    Intake/Output Summary (Last 24 hours) at 05/06/2018 0914 Last data filed at 05/05/2018 1616 Gross per 24 hour  Intake 350 ml  Output -  Net 350 ml    Additional Objective Labs: Basic Metabolic Panel: Recent Labs  Lab 05/01/18 1146 05/04/18 0630 05/04/18 1501 05/04/18 1700 05/05/18 0836  NA 134* 132*  --  136 136  K 4.3 4.0  --  4.6 4.0  CL 94*  --   --  97* 97*  CO2 25  --   --  24 25  GLUCOSE 154* 151*  --  148* 92  BUN 31*  --   --  36* 19  CREATININE 5.54*  --  5.74* 6.33* 4.54*  CALCIUM 9.4  --   --  8.6* 8.7*  PHOS  --   --   --  6.0*  --    Liver Function Tests: Recent Labs  Lab 05/01/18 1146 05/04/18 1700  AST 24  --   ALT 18  --   ALKPHOS 123  --   BILITOT 1.5*  --   PROT 6.5  --   ALBUMIN 3.0* 2.6*   CBC: Recent Labs  Lab 05/01/18 1146 05/04/18 0630 05/04/18 1501 05/05/18 0836  WBC 4.7  --  4.5 8.4  HGB 11.9* 13.6 9.9* 11.0*  HCT 40.2 40.0 32.1* 35.4*  MCV 88.0   --  86.5 85.5  PLT 122*  --  112* 131*  CBG: Recent Labs  Lab 05/05/18 1203 05/05/18 1601 05/05/18 2009 05/05/18 2157 05/06/18 0614  GLUCAP 79 129* 98 107* 94   Medications: . sodium chloride     . amiodarone  200 mg Oral BID  . aspirin EC  81 mg Oral Daily  . atorvastatin  40 mg Oral q1800  . [START ON 05/07/2018] calcitRIOL  0.5 mcg Oral Q M,W,F-HD  . Chlorhexidine Gluconate Cloth  6 each Topical Q0600  . docusate sodium  100 mg Oral Daily  . ferric citrate  420 mg Oral TID WC  . FLUoxetine  20 mg Oral Daily  . gabapentin  300 mg Oral QHS  . heparin  5,000 Units Subcutaneous Q8H  . insulin aspart  0-15 Units Subcutaneous TID WC  . insulin glargine  10 Units Subcutaneous QHS  . levalbuterol  1-2 puff Inhalation Once  . metoprolol tartrate  12.5 mg Oral BID  . multivitamin  1 tablet Oral QHS  . pantoprazole  40 mg Oral Daily    Dialysis Orders: MWF -[facility] 4hrs, [id number], DFRAF1.5, EDW 80kg,2K/2.25Ca  Access:R IJ TDC, RU AVF maturing Heparinnone Calcitriol0.81mcg PO qHD Venofer 50mg  qwk  Assessment/Plan: 1. PVD with non healing L great toe wound - s/pL femoral endarterectomy and L fem-pop bypass on 2/7 by Dr. Donnetta Hutching. Per [facility] 2. ESRD - On HD MWF. K 4.0.  Next HD on 2/10 per regular scheudle 3. Hypertension/volume - BP currently well controlled. Under EDW post HD.  Continues to have LE edema, but volume stable.  Typically uses lasix prn on off day. Will likely need dry lowered at d/c.  Was under as OP last HD as well.  4. Anemia of CKD - Hgb 11.0. No indication for ESA at this time.Follow trends. 5. Secondary Hyperparathyroidism - Ca in goal. Phos elevated. Continue VDRA and binders. 6. Nutrition -Renal diet w/fluid restrictions once advanced. Protein supplements.  7. DMT2 - per primary 8. COPD - increased wheezing today.  Per patient uses nebulizer at home.  Per primary. 9. ICM 10. Thrombocytopenia 11. OSA  12. [place] to d/c from a  renal standpoint.  Jen Mow, PA-C [facility] Pager: [phone] 05/06/2018,9:14 AM  LOS: 2 days

## 2018-05-06 NOTE — Discharge Instructions (Signed)
 [facility] [place]  Discharge instructions  Lower Extremity Bypass Surgery  Please refer to the following instruction for your post-procedure care. Your surgeon or physician assistant will discuss any changes with you.  Activity  You are encouraged to walk as much as you can. You can slowly return to normal activities during the month after your surgery. Avoid strenuous activity and heavy lifting until your doctor tells you it's OK. Avoid activities such as vacuuming or swinging a golf club. Do not drive until your doctor give the OK and you are no longer taking prescription pain medications. It is also normal to have difficulty with sleep habits, eating and bowel movement after surgery. These will go away with time.  Bathing/Showering  You may shower after you go home. Do not soak in a bathtub, hot tub, or swim until the incision heals completely.  Incision Care  Clean your incision with mild soap and water. Shower every day. Pat the area dry with a clean towel. You do not need a bandage unless otherwise instructed. Do not apply any ointments or creams to your incision. If you have open wounds you will be instructed how to care for them or a visiting nurse may be arranged for you. If you have staples or sutures along your incision they will be removed at your post-op appointment. You may have skin glue on your incision. Do not peel it off. It will come off on its own in about one week. If you have a great deal of moisture in your groin, use a gauze help keep this area dry.  Diet  Resume your normal diet. There are no special food restrictions following this procedure. A low fat/ low cholesterol diet is recommended for all patients with vascular disease. In order to heal from your surgery, it is CRITICAL to get adequate nutrition. Your body requires vitamins, minerals, and protein. Vegetables are the best source of vitamins and minerals. Vegetables also provide the  perfect balance of protein. Processed food has little nutritional value, so try to avoid this.  Medications  Resume taking all your medications unless your doctor or nurse practitioner tells you not to. If your incision is causing pain, you may take over-the-counter pain relievers such as acetaminophen (Tylenol). If you were prescribed a stronger pain medication, please aware these medication can cause nausea and constipation. Prevent nausea by taking the medication with a snack or meal. Avoid constipation by drinking plenty of fluids and eating foods with high amount of fiber, such as fruits, vegetables, and grains. Take Colase 100 mg (an over-the-counter stool softener) twice a day as needed for constipation. Do not take Tylenol if you are taking prescription pain medications.  Follow Up  Our office will schedule a follow up appointment 2-3 weeks following discharge.  Please call us immediately for any of the following conditions  Severe or worsening pain in your legs or feet while at rest or while walking Increase pain, redness, warmth, or drainage (pus) from your incision site(s) Fever of 101 degree or higher The swelling in your leg with the bypass suddenly worsens and becomes more painful than when you were in the hospital If you have been instructed to feel your graft pulse then you should do so every day. If you can no longer feel this pulse, call the office immediately. Not all patients are given this instruction.  Leg swelling is common after leg bypass surgery.  The swelling should improve over a few months   following surgery. To improve the swelling, you may elevate your legs above the level of your heart while you are sitting or resting. Your surgeon or physician assistant may ask you to apply an ACE wrap or wear compression (TED) stockings to help to reduce swelling.  Reduce your risk of vascular disease  Stop smoking. If you would like help call [facility] at 1-800-QUIT-NOW  (1-800-784-8669) or [place] at 336-586-4000.  Manage your cholesterol Maintain a desired weight Control your diabetes weight Control your diabetes Keep your blood pressure down  If you have any questions, please call the office at [phone]   

## 2018-05-06 NOTE — Progress Notes (Addendum)
  Progress Note    05/06/2018 7:40 AM 2 Days Post-Op  Subjective:  Would like to go home today; ambulating without difficulty and tolerating a regular diet   Vitals:   05/06/18 0248 05/06/18 0700  BP: 128/68   Pulse: 65   Resp: 19 (!) 23  Temp: 99.1 F (37.3 C)   SpO2: 97%    Physical Exam: Lungs:  Non labored Incisions:  L groin and popliteal incision c/d/i Extremities:  L DP by doppler Neurologic: A&O  CBC    Component Value Date/Time   WBC 8.4 05/05/2018 0836   RBC 4.14 (L) 05/05/2018 0836   HGB 11.0 (L) 05/05/2018 0836   HCT 35.4 (L) 05/05/2018 0836   PLT 131 (L) 05/05/2018 0836   MCV 85.5 05/05/2018 0836   MCH 26.6 05/05/2018 0836   MCHC 31.1 05/05/2018 0836   RDW 15.9 (H) 05/05/2018 0836    BMET    Component Value Date/Time   NA 136 05/05/2018 0836   K 4.0 05/05/2018 0836   CL 97 (L) 05/05/2018 0836   CO2 25 05/05/2018 0836   GLUCOSE 92 05/05/2018 0836   BUN 19 05/05/2018 0836   CREATININE 4.54 (H) 05/05/2018 0836   CALCIUM 8.7 (L) 05/05/2018 0836   GFRNONAA 13 (L) 05/05/2018 0836   GFRAA 16 (L) 05/05/2018 0836    INR    Component Value Date/Time   INR 1.23 05/01/2018 1146     Intake/Output Summary (Last 24 hours) at 05/06/2018 0740 Last data filed at 05/05/2018 1616 Gross per 24 hour  Intake 590 ml  Output -  Net 590 ml     Assessment/Plan:  58 y.o. male is s/p L CFA and profunda endarterectomy with patch angioplasty and femoral to AK popliteal bypass with PTFE 2 Days Post-Op   Perfusing L foot well with DP signal by doppler Continue santyl dressings L GT Ok for discharge home today Follow up with Dr. Donnetta Hutching in 2 weeks   Dagoberto Ligas, PA-C [facility] [phone] 05/06/2018 7:40 AM  I have seen and evaluated the patient. I agree with the PA note as documented above. Doing well s/p LLE bypass by Dr. Donnetta Hutching.  Brisk DP/PT signals.  Santyl to great toe.  Wants to go home.  Has been OOB.  Cleared by PT and nephrology.   OK for d/c home.  Will arrange 2-3 week follow-up in clinic for wound check.    Marty Heck, MD [facility] Office: [phone] Pager: [phone]

## 2018-05-06 NOTE — Discharge Summary (Signed)
Physician Discharge Summary   Patient ID: Jose Sellers [id number] 58 y.o. 12-07-1960  Admit date: 05/04/2018  Discharge date and time: 05/06/18    Admitting Physician: [name], MD  Discharge Physician: Dr. Carlis Abbott  Admission Diagnoses: PERIPHERAL VASCULAR DISEASE  Discharge Diagnoses: same  Admission Condition: fair  Discharged Condition: good  Indication for Admission: Critical limb ischemia with nonhealing ulceration left great toe  Hospital Course: Revis Whalin is a 58 year old male who came in with critical limb ischemia with nonhealing ulceration of left great toe and underwent left femoral endarterectomy with patch angioplasty and left femoral to above-the-knee popliteal bypass with PTFE graft by Dr. early on 05/04/2018.  He tolerated the surgery well and was admitted to the hospital postoperatively.  He maintained a brisk Doppler signal left DP.  Santyl dressing changes were continued to his left great toe.  Mobility was increased and pain was controlled and was ready for discharge home postoperative day #2.  He was prescribed 2 to 3 days of narcotic pain medication for continued postoperative pain control.  It should also be mentioned that nephrology was consulted for management of end-stage renal disease on hemodialysis during hospitalization.  He will follow-up with Dr. early in about 2 weeks.  Discharge instructions were reviewed with the patient and he voices his understanding.  He was discharged home this morning in stable condition.  Consults: nephrology  Treatments: surgery: Left femoral endarterectomy with patch angioplasty and left femoral to above-the-knee popliteal bypass with PTFE graft by Dr. Donnetta Hutching on 05/04/2018  Discharge Exam: see progress note 05/06/18 Vitals:   05/06/18 0836 05/06/18 0843  BP:  120/73  Pulse:  68  Resp: 20 17  Temp:  98.6 F (37 C)  SpO2:  95%    Disposition: Discharge disposition: 01-Home or Self Care       - For VQI Registry use  ---  Post-op:  Wound infection: No  Graft infection: No  Transfusion: No   New Arrhythmia: No Patency judged by: [x ] Dopper only, [ ]  Palpable graft pulse, [ ]  Palpable distal pulse, [ ]  ABI inc. > 0.15, [ ]  Duplex D/C Ambulatory Status: Ambulatory with Assistance  Complications: MI: [ x] No, [ ]  Troponin only, [ ]  EKG or Clinical CHF: No Resp failure: [x ] none, [ ]  Pneumonia, [ ]  Ventilator Chg in renal function: x ] none, [ ]  Inc. Cr > 0.5, [ ]  Temp. Dialysis, [ ]  Permanent dialysis Stroke: [x ] None, [ ]  Minor, [ ]  Major Return to OR: No  Reason for return to OR: [ ]  Bleeding, [ ]  Infection, [ ]  Thrombosis, [ ]  Revision  Discharge medications: Statin use:  Yes ASA use:  Yes Plavix use:  No  for medical reason no indication Beta blocker use: No  for medical reason no indication Coumadin use: No  for medical reason no indication    Patient Instructions:  Allergies as of 05/06/2018      Reactions   Albuterol Palpitations   SVT   Prednisone Shortness Of Breath   Wife stated that patient develops breathing issues.   Fluticasone Furoate-vilanterol Palpitations   Morphine Rash      Medication List    STOP taking these medications   acetaminophen-codeine 300-30 MG tablet Commonly known as:  TYLENOL #3     TAKE these medications   amiodarone 200 MG tablet Commonly known as:  PACERONE Take 200 mg by mouth 2 (two) times daily.   aspirin 81 MG tablet  Take 81 mg by mouth daily.   atorvastatin 40 MG tablet Commonly known as:  LIPITOR Take 40 mg by mouth 2 (two) times daily.   AURYXIA 1 GM 210 MG(Fe) tablet Generic drug:  ferric citrate Take 420 mg by mouth 3 (three) times daily with meals.   calcitRIOL 0.25 MCG capsule Commonly known as:  ROCALTROL Take 0.25 mcg by mouth every Monday, Wednesday, and Friday with hemodialysis.   DIALYVITE 800 0.8 MG Tabs Take 1 tablet by mouth daily.   esomeprazole 40 MG capsule Commonly known as:  NEXIUM Take 40 mg by mouth  daily before breakfast.   FLUoxetine 20 MG capsule Commonly known as:  PROZAC Take 20 mg by mouth daily.   furosemide 40 MG tablet Commonly known as:  LASIX Take 40 mg by mouth daily as needed for fluid or edema.   gabapentin 300 MG capsule Commonly known as:  NEURONTIN Take 1 capsule (300 mg total) by mouth 3 (three) times daily. What changed:  when to take this   HUMALOG KWIKPEN 100 UNIT/ML KwikPen Generic drug:  insulin lispro Inject 0-32 Units into the skin 3 (three) times daily. Sliding Scale Insulin   LANTUS SOLOSTAR 100 UNIT/ML Solostar Pen Generic drug:  Insulin Glargine Inject 10 Units into the skin at bedtime.   levalbuterol 0.63 MG/3ML nebulizer solution Commonly known as:  XOPENEX Inhale 0.63 mg into the lungs every 8 (eight) hours as needed for wheezing or shortness of breath.   lidocaine-prilocaine cream Commonly known as:  EMLA Apply 1 application topically See admin instructions. Apply small amount to access site 1-2 hours prior to dialysis.   metoprolol tartrate 25 MG tablet Commonly known as:  LOPRESSOR Take 12.5 mg by mouth 2 (two) times daily.   nitroGLYCERIN 0.4 MG SL tablet Commonly known as:  NITROSTAT Place 0.4 mg under the tongue every 5 (five) minutes as needed for chest pain.   oxycodone 30 MG immediate release tablet Commonly known as:  ROXICODONE Take 30 mg by mouth every 8 (eight) hours as needed for pain.   oxyCODONE-acetaminophen 7.5-325 MG tablet Commonly known as:  PERCOCET Take 1 tablet by mouth every 6 (six) hours as needed for severe pain.   SANTYL ointment Generic drug:  collagenase Apply 1 application topically daily.   tiotropium 18 MCG inhalation capsule Commonly known as:  SPIRIVA Place 18 mcg into inhaler and inhale daily.      Activity: activity as tolerated Diet: regular diet Wound Care: keep wound clean and dry  Follow-up with Dr. Donnetta Hutching in 2 weeks.  SignedDagoberto Ligas 05/06/2018 9:26 AM

## 2018-05-06 NOTE — Progress Notes (Signed)
Patient discharged.  Peripheral IV removed, vital signs obtained and were stable.  Telebox removed and placed back at telebox station.  AVS given and explained to patient and wife.  All questions addressed.

## 2018-05-07 ENCOUNTER — Encounter (HOSPITAL_COMMUNITY): Payer: Self-pay | Admitting: Vascular Surgery

## 2018-05-07 ENCOUNTER — Telehealth: Payer: Self-pay | Admitting: Vascular Surgery

## 2018-05-07 NOTE — Anesthesia Postprocedure Evaluation (Signed)
Anesthesia Post Note  Patient: Jose Sellers  Procedure(s) Performed: LEFT FEMORAL TO ABOVE KNEE POPLITEAL BYPASS GRAFT USING 6MM X 80CM GORE PROPATEN RINGED GRAFT (Left ) LEFT FEMORAL ENDARTERECTOMY (Left ) Left Common Femoral/Profunda Patch Angioplasty using Hemashield Platinum Finesse Patch (Left )     Patient location during evaluation: PACU Anesthesia Type: General Level of consciousness: awake and alert Pain management: pain level controlled Vital Signs Assessment: post-procedure vital signs reviewed and stable Respiratory status: spontaneous breathing, nonlabored ventilation, respiratory function stable and patient connected to nasal cannula oxygen Cardiovascular status: blood pressure returned to baseline and stable Postop Assessment: no apparent nausea or vomiting Anesthetic complications: no    Last Vitals:  Vitals:   05/06/18 1119 05/06/18 1141  BP:  111/87  Pulse:  67  Resp:  12  Temp:  36.8 C  SpO2: 96% 98%    Last Pain:  Vitals:   05/06/18 1141  TempSrc: Oral  PainSc: 0-No pain                 Ryan P Ellender

## 2018-05-07 NOTE — Telephone Encounter (Signed)
sch appt lvm 05/29/2018 230pm p/o MD

## 2018-05-07 NOTE — Telephone Encounter (Signed)
-----   Message from Dagoberto Ligas, PA-C sent at 05/06/2018  7:45 AM EST -----  Can you schedule an appt for this pt in 2 weeks with Dr. Donnetta Hutching.  PO L CFA endart and fem pop. Thanks, [facility]

## 2018-05-10 ENCOUNTER — Encounter: Payer: Self-pay | Admitting: Sports Medicine

## 2018-05-10 ENCOUNTER — Ambulatory Visit: Payer: Medicare Other | Admitting: Sports Medicine

## 2018-05-10 VITALS — BP 143/76 | HR 67 | Temp 97.7°F | Resp 16

## 2018-05-10 DIAGNOSIS — E1142 Type 2 diabetes mellitus with diabetic polyneuropathy: Secondary | ICD-10-CM | POA: Diagnosis not present

## 2018-05-10 DIAGNOSIS — I739 Peripheral vascular disease, unspecified: Secondary | ICD-10-CM

## 2018-05-10 DIAGNOSIS — L97522 Non-pressure chronic ulcer of other part of left foot with fat layer exposed: Secondary | ICD-10-CM

## 2018-05-10 DIAGNOSIS — N186 End stage renal disease: Secondary | ICD-10-CM

## 2018-05-10 NOTE — Progress Notes (Signed)
Subjective: Jose Sellers is a 58 y.o. male patient with history of diabetes who presents to office today for evaluation of left great toe wound and is assisted by wife who reports that there is no pain and it looks a little better and states that the Santyl ointment seems to be helping however wife is concerned that there is a little bit of swelling and redness and that her husband still continues with tennis shoes and thinks this is rubbing the toe.  Patient denies nausea vomiting fever chills warmth or any purulence to the site.  Patient is diabetic blood sugar this morning not recorded but yesterday was 142 and patient is also on dialysis like previously noted.  Patient Active Problem List   Diagnosis Date Noted  . PAD (peripheral artery disease) ([place]) 05/04/2018  . AICD (automatic cardioverter/defibrillator) present   . Typical atrial flutter ([place]) 10/16/2017  . Acute respiratory failure with hypoxia ([place]) 09/08/2017  . C. difficile diarrhea 09/08/2017  . Peritonitis associated with peritoneal dialysis ([place]) 08/16/2017  . At high risk for falls 07/27/2017  . Arteriosclerotic vascular disease 03/16/2017  . Chronic heart disease 03/16/2017  . End-stage renal disease ([place]) 03/16/2017  . Hypercholesterolemia 03/16/2017  . Hypertensive disorder 03/16/2017  . Anemia of unknown etiology 07/07/2016  . Malaise and fatigue 07/07/2016  . Chronic pain syndrome 06/20/2016  . Secondary hyperparathyroidism ([place]) 12/16/2015  . Stage 5 chronic kidney disease ([place]) 12/16/2015  . Chronic systolic congestive heart failure ([place]) 11/25/2015  . Tobacco use 11/25/2015  . Ventral hernia without obstruction or gangrene 10/26/2015  . Psychosexual dysfunction with inhibited sexual excitement 08/27/2015  . Anxiety 08/26/2015  . COPD (chronic obstructive pulmonary disease) ([place]) 08/26/2015  . DDD (degenerative disc disease), lumbosacral 08/26/2015  . GERD (gastroesophageal reflux disease) 08/26/2015  .  Hypothyroidism 08/26/2015  . Microalbuminuria 08/26/2015  . Mixed hyperlipidemia 08/26/2015  . Osteoarthritis of multiple joints 08/26/2015  . Peripheral vascular disease ([place]) 08/26/2015  . Abdominal hernia 06/16/2015  . CAD (coronary artery disease) 04/07/2015  . 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 OSA (obstructive sleep apnea) 04/07/2015  . Essential hypertension 03/06/2015  . Retinopathy, diabetic, bilateral ([place]) 03/06/2015  . Uncontrolled type 2 diabetes mellitus with diabetic polyneuropathy, with long-term current use of insulin ([place]) 03/06/2015  . Atherosclerosis with claudication of extremity ([place]) 08/01/2014  . Mild episode of recurrent major depressive disorder ([place]) 05/01/2013  . Panic disorder 05/01/2013   Current Outpatient Medications on File Prior to Visit  Medication Sig Dispense Refill  . amiodarone (PACERONE) 200 MG tablet Take 200 mg by mouth 2 (two) times daily.     Marland Kitchen aspirin 81 MG tablet Take 81 mg by mouth daily.     Marland Kitchen atorvastatin (LIPITOR) 40 MG tablet Take 40 mg by mouth 2 (two) times daily.     . B Complex-C-Folic Acid (DIALYVITE 379) 0.8 MG TABS Take 1 tablet by mouth daily.   4  . calcitRIOL (ROCALTROL) 0.25 MCG capsule Take 0.25 mcg by mouth every Monday, Wednesday, and Friday with hemodialysis.     Marland Kitchen esomeprazole (NEXIUM) 40 MG capsule Take 40 mg by mouth daily before breakfast.     . ferric citrate (AURYXIA) 1 GM 210 MG(Fe) tablet Take 420 mg by mouth 3 (three) times daily with meals.     Marland Kitchen FLUoxetine (PROZAC) 20 MG capsule Take 20 mg by mouth daily.     . furosemide (LASIX) 40 MG tablet Take 40 mg by mouth daily as needed for  fluid or edema.     . gabapentin (NEURONTIN) 300 MG capsule Take 1 capsule (300 mg total) by mouth 3 (three) times daily. (Patient taking differently: Take 300 mg by mouth at bedtime. ) 90 capsule 3  . Insulin Glargine (LANTUS SOLOSTAR) 100 UNIT/ML Solostar Pen Inject 10 Units into the skin at bedtime.      . insulin lispro (HUMALOG KWIKPEN) 100 UNIT/ML KwikPen Inject 0-32 Units into the skin 3 (three) times daily. Sliding Scale Insulin     . levalbuterol (XOPENEX) 0.63 MG/3ML nebulizer solution Inhale 0.63 mg into the lungs every 8 (eight) hours as needed for wheezing or shortness of breath.   1  . lidocaine-prilocaine (EMLA) cream Apply 1 application topically See admin instructions. Apply small amount to access site 1-2 hours prior to dialysis.  12  . metoprolol tartrate (LOPRESSOR) 25 MG tablet Take 12.5 mg by mouth 2 (two) times daily.     . mupirocin ointment (BACTROBAN) 2 %     . nitroGLYCERIN (NITROSTAT) 0.4 MG SL tablet Place 0.4 mg under the tongue every 5 (five) minutes as needed for chest pain.    Marland Kitchen oxycodone (ROXICODONE) 30 MG immediate release tablet Take 30 mg by mouth every 8 (eight) hours as needed for pain.     Marland Kitchen oxyCODONE-acetaminophen (PERCOCET) 7.5-325 MG tablet Take 1 tablet by mouth every 6 (six) hours as needed for severe pain. 25 tablet 0  . SANTYL ointment Apply 1 application topically daily.     Marland Kitchen tiotropium (SPIRIVA) 18 MCG inhalation capsule Place 18 mcg into inhaler and inhale daily.      No current facility-administered medications on file prior to visit.    Allergies  Allergen Reactions  . Albuterol Palpitations    SVT  . Prednisone Shortness Of Breath    Wife stated that patient develops breathing issues.  . Fluticasone Furoate-Vilanterol Palpitations  . Morphine Rash    Recent Results (from the past 2160 hour(s))  I-STAT 4, (NA,K, GLUC, HGB,HCT)     Status: Abnormal   Collection Time: 02/13/18  7:49 AM  Result Value Ref Range   Sodium 132 (L) 135 - 145 mmol/L   Potassium 4.0 3.5 - 5.1 mmol/L   Glucose, Bld 123 (H) 70 - 99 mg/dL   HCT 33.0 (L) 39.0 - 52.0 %   Hemoglobin 11.2 (L) 13.0 - 17.0 g/dL  Protime-INR     Status: None   Collection Time: 02/13/18  7:53 AM  Result Value Ref Range   Prothrombin Time 13.9 11.4 - 15.2 seconds   INR 1.08      Comment: Performed at [facility], [place] [address], [place], [place] [address]  Glucose, capillary     Status: None   Collection Time: 02/13/18 11:15 AM  Result Value Ref Range   Glucose-Capillary 98 70 - 99 mg/dL   Comment 1 Notify RN   I-STAT 4, (NA,K, GLUC, HGB,HCT)     Status: Abnormal   Collection Time: 04/20/18 10:56 AM  Result Value Ref Range   Sodium 133 (L) 135 - 145 mmol/L   Potassium 5.0 3.5 - 5.1 mmol/L   Glucose, Bld 116 (H) 70 - 99 mg/dL   HCT 39.0 39.0 - 52.0 %   Hemoglobin 13.3 13.0 - 17.0 g/dL  I-STAT creatinine     Status: Abnormal   Collection Time: 04/20/18 10:57 AM  Result Value Ref Range   Creatinine, Ser 6.10 (H) 0.61 - 1.24 mg/dL  Glucose, capillary  Status: Abnormal   Collection Time: 04/20/18 12:41 PM  Result Value Ref Range   Glucose-Capillary 109 (H) 70 - 99 mg/dL  Glucose, capillary     Status: Abnormal   Collection Time: 05/01/18 10:21 AM  Result Value Ref Range   Glucose-Capillary 158 (H) 70 - 99 mg/dL  APTT     Status: Abnormal   Collection Time: 05/01/18 11:46 AM  Result Value Ref Range   aPTT 37 (H) 24 - 36 seconds    Comment:        IF BASELINE aPTT IS ELEVATED, SUGGEST PATIENT RISK ASSESSMENT BE USED TO DETERMINE APPROPRIATE ANTICOAGULANT THERAPY. Performed at [facility], [place] [address], [place], [place] [address]   CBC     Status: Abnormal   Collection Time: 05/01/18 11:46 AM  Result Value Ref Range   WBC 4.7 4.0 - 10.5 K/uL   RBC 4.57 4.22 - 5.81 MIL/uL   Hemoglobin 11.9 (L) 13.0 - 17.0 g/dL   HCT 40.2 39.0 - 52.0 %   MCV 88.0 80.0 - 100.0 fL   MCH 26.0 26.0 - 34.0 pg   MCHC 29.6 (L) 30.0 - 36.0 g/dL   RDW 16.2 (H) 11.5 - 15.5 %   Platelets 122 (L) 150 - 400 K/uL    Comment: REPEATED TO VERIFY SPECIMEN CHECKED FOR CLOTS    nRBC 0.0 0.0 - 0.2 %    Comment: Performed at [facility], [place] [address], [place], [place] [address]  Comprehensive metabolic panel     Status: Abnormal   Collection Time: 05/01/18  11:46 AM  Result Value Ref Range   Sodium 134 (L) 135 - 145 mmol/L   Potassium 4.3 3.5 - 5.1 mmol/L   Chloride 94 (L) 98 - 111 mmol/L   CO2 25 22 - 32 mmol/L   Glucose, Bld 154 (H) 70 - 99 mg/dL   BUN 31 (H) 6 - 20 mg/dL   Creatinine, Ser 5.54 (H) 0.61 - 1.24 mg/dL   Calcium 9.4 8.9 - 10.3 mg/dL   Total Protein 6.5 6.5 - 8.1 g/dL   Albumin 3.0 (L) 3.5 - 5.0 g/dL   AST 24 15 - 41 U/L   ALT 18 0 - 44 U/L   Alkaline Phosphatase 123 38 - 126 U/L   Total Bilirubin 1.5 (H) 0.3 - 1.2 mg/dL   GFR calc non Af Amer 11 (L) >60 mL/min   GFR calc Af Amer 12 (L) >60 mL/min   Anion gap 15 5 - 15    Comment: Performed at [facility], [place] [address] [place], [place] [address]  Protime-INR     Status: Abnormal   Collection Time: 05/01/18 11:46 AM  Result Value Ref Range   Prothrombin Time 15.4 (H) 11.4 - 15.2 seconds   INR 1.23     Comment: Performed at [facility] [address] [place], [place] [address]  Urinalysis, Routine w reflex microscopic     Status: Abnormal   Collection Time: 05/01/18 11:46 AM  Result Value Ref Range   Color, Urine AMBER (A) YELLOW    Comment: BIOCHEMICALS MAY BE AFFECTED BY COLOR   APPearance CLEAR CLEAR   Specific Gravity, Urine 1.019 1.005 - 1.030   pH 5.0 5.0 - 8.0   Glucose, UA 50 (A) NEGATIVE mg/dL   Hgb urine dipstick MODERATE (A) NEGATIVE   Bilirubin Urine NEGATIVE NEGATIVE   Ketones, ur NEGATIVE NEGATIVE mg/dL   Protein, ur 100 (A) NEGATIVE mg/dL  Nitrite NEGATIVE NEGATIVE   Leukocytes, UA NEGATIVE NEGATIVE   RBC / HPF 0-5 0 - 5 RBC/hpf   WBC, UA 6-10 0 - 5 WBC/hpf   Bacteria, UA RARE (A) NONE SEEN   Mucus PRESENT    Hyaline Casts, UA PRESENT    Granular Casts, UA PRESENT     Comment: Performed at [facility] [address], [place], [place] [address]  Surgical pcr screen     Status: Abnormal   Collection Time: 05/01/18 11:46 AM  Result Value Ref Range   MRSA, PCR POSITIVE (A) NEGATIVE    Comment: RESULT CALLED TO, READ  BACK BY AND VERIFIED WITH: Jesse Sans NT 13:50 05/01/18 ([email or web address])    Staphylococcus aureus POSITIVE (A) NEGATIVE    Comment: (NOTE) The Xpert SA Assay (FDA approved for NASAL specimens in patients 37 years of age and older), is one component of a comprehensive surveillance program. It is not intended to diagnose infection nor to guide or monitor treatment. Performed at [facility], [place] [address] [place], [place] [address]   Hemoglobin A1c     Status: Abnormal   Collection Time: 05/01/18 11:47 AM  Result Value Ref Range   Hgb A1c MFr Bld 6.0 (H) 4.8 - 5.6 %    Comment: (NOTE) Pre diabetes:          5.7%-6.4% Diabetes:              >6.4% Glycemic control for   <7.0% adults with diabetes    Mean Plasma Glucose 125.5 mg/dL    Comment: Performed at [facility] [address] [place], [place] [address]  Type and screen     Status: None   Collection Time: 05/01/18 11:50 AM  Result Value Ref Range   ABO/RH(D) O POS    Antibody Screen NEG    Sample Expiration 05/15/2018    Extend sample reason      NO TRANSFUSIONS OR PREGNANCY IN THE PAST 3 MONTHS Performed at [facility], [place] [address], [place],  [address]   ABO/Rh     Status: None   Collection Time: 05/01/18 11:50 AM  Result Value Ref Range   ABO/RH(D)      O POS Performed at [facility] [address] [place], [place] [address]   Glucose, capillary     Status: Abnormal   Collection Time: 05/04/18  5:52 AM  Result Value Ref Range   Glucose-Capillary 148 (H) 70 - 99 mg/dL  I-STAT 4, (NA,K, GLUC, HGB,HCT)     Status: Abnormal   Collection Time: 05/04/18  6:30 AM  Result Value Ref Range   Sodium 132 (L) 135 - 145 mmol/L   Potassium 4.0 3.5 - 5.1 mmol/L   Glucose, Bld 151 (H) 70 - 99 mg/dL   HCT 40.0 39.0 - 52.0 %   Hemoglobin 13.6 13.0 - 17.0 g/dL  Glucose, capillary     Status: Abnormal   Collection Time: 05/04/18 12:17 PM  Result Value Ref Range   Glucose-Capillary 115 (H) 70 - 99  mg/dL   Comment 1 Notify RN    Comment 2 Document in Chart   CBC     Status: Abnormal   Collection Time: 05/04/18  3:01 PM  Result Value Ref Range   WBC 4.5 4.0 - 10.5 K/uL   RBC 3.71 (L) 4.22 - 5.81 MIL/uL   Hemoglobin 9.9 (L) 13.0 - 17.0 g/dL    Comment: REPEATED TO  VERIFY   HCT 32.1 (L) 39.0 - 52.0 %   MCV 86.5 80.0 - 100.0 fL   MCH 26.7 26.0 - 34.0 pg   MCHC 30.8 30.0 - 36.0 g/dL   RDW 15.9 (H) 11.5 - 15.5 %   Platelets 112 (L) 150 - 400 K/uL    Comment: REPEATED TO VERIFY PLATELET COUNT CONFIRMED BY SMEAR Immature Platelet Fraction may be clinically indicated, consider ordering this additional test [id number]    nRBC 0.0 0.0 - 0.2 %    Comment: Performed at [facility] [address], [place], [place] [address]  Creatinine, serum     Status: Abnormal   Collection Time: 05/04/18  3:01 PM  Result Value Ref Range   Creatinine, Ser 5.74 (H) 0.61 - 1.24 mg/dL   GFR calc non Af Amer 10 (L) >60 mL/min   GFR calc Af Amer 12 (L) >60 mL/min    Comment: Performed at [facility] [address], [place], [place] [address]  Glucose, capillary     Status: Abnormal   Collection Time: 05/04/18  3:27 PM  Result Value Ref Range   Glucose-Capillary 122 (H) 70 - 99 mg/dL   Comment 1 Notify RN   Renal function panel     Status: Abnormal   Collection Time: 05/04/18  5:00 PM  Result Value Ref Range   Sodium 136 135 - 145 mmol/L   Potassium 4.6 3.5 - 5.1 mmol/L   Chloride 97 (L) 98 - 111 mmol/L   CO2 24 22 - 32 mmol/L   Glucose, Bld 148 (H) 70 - 99 mg/dL   BUN 36 (H) 6 - 20 mg/dL   Creatinine, Ser 6.33 (H) 0.61 - 1.24 mg/dL   Calcium 8.6 (L) 8.9 - 10.3 mg/dL   Phosphorus 6.0 (H) 2.5 - 4.6 mg/dL   Albumin 2.6 (L) 3.5 - 5.0 g/dL   GFR calc non Af Amer 9 (L) >60 mL/min   GFR calc Af Amer 10 (L) >60 mL/min   Anion gap 15 5 - 15    Comment: Performed at [facility], [address], [place], [place] [address]  Glucose, capillary     Status: Abnormal   Collection  Time: 05/04/18  9:45 PM  Result Value Ref Range   Glucose-Capillary 155 (H) 70 - 99 mg/dL  Glucose, capillary     Status: Abnormal   Collection Time: 05/05/18  6:06 AM  Result Value Ref Range   Glucose-Capillary 110 (H) 70 - 99 mg/dL  CBC     Status: Abnormal   Collection Time: 05/05/18  8:36 AM  Result Value Ref Range   WBC 8.4 4.0 - 10.5 K/uL   RBC 4.14 (L) 4.22 - 5.81 MIL/uL   Hemoglobin 11.0 (L) 13.0 - 17.0 g/dL   HCT 35.4 (L) 39.0 - 52.0 %   MCV 85.5 80.0 - 100.0 fL   MCH 26.6 26.0 - 34.0 pg   MCHC 31.1 30.0 - 36.0 g/dL   RDW 15.9 (H) 11.5 - 15.5 %   Platelets 131 (L) 150 - 400 K/uL   nRBC 0.0 0.0 - 0.2 %    Comment: Performed at [facility], [place] [address] [place], [place] [address]  Basic metabolic panel     Status: Abnormal   Collection Time: 05/05/18  8:36 AM  Result Value Ref Range   Sodium 136 135 - 145 mmol/L   Potassium 4.0 3.5 - 5.1 mmol/L   Chloride 97 (L) 98 -  111 mmol/L   CO2 25 22 - 32 mmol/L   Glucose, Bld 92 70 - 99 mg/dL   BUN 19 6 - 20 mg/dL   Creatinine, Ser 4.54 (H) 0.61 - 1.24 mg/dL   Calcium 8.7 (L) 8.9 - 10.3 mg/dL   GFR calc non Af Amer 13 (L) >60 mL/min   GFR calc Af Amer 16 (L) >60 mL/min   Anion gap 14 5 - 15    Comment: Performed at [facility] [address], [place], [place] [address]  Glucose, capillary     Status: Abnormal   Collection Time: 05/05/18 11:22 AM  Result Value Ref Range   Glucose-Capillary 67 (L) 70 - 99 mg/dL  Glucose, capillary     Status: None   Collection Time: 05/05/18 12:03 PM  Result Value Ref Range   Glucose-Capillary 79 70 - 99 mg/dL  Glucose, capillary     Status: Abnormal   Collection Time: 05/05/18  4:01 PM  Result Value Ref Range   Glucose-Capillary 129 (H) 70 - 99 mg/dL  Glucose, capillary     Status: None   Collection Time: 05/05/18  8:09 PM  Result Value Ref Range   Glucose-Capillary 98 70 - 99 mg/dL  Glucose, capillary     Status: Abnormal   Collection Time: 05/05/18  9:57 PM   Result Value Ref Range   Glucose-Capillary 107 (H) 70 - 99 mg/dL  Glucose, capillary     Status: None   Collection Time: 05/06/18  6:14 AM  Result Value Ref Range   Glucose-Capillary 94 70 - 99 mg/dL  Glucose, capillary     Status: Abnormal   Collection Time: 05/06/18 11:06 AM  Result Value Ref Range   Glucose-Capillary 132 (H) 70 - 99 mg/dL    Objective: General: Patient is awake, alert, and oriented x 3 and in no acute distress.  Integument: Skin is warm, dry and supple bilateral.  Left hallux nailbed is clean dry and intact however to the distal tuft and medial aspect of the left great toe there is a fibrotic ulceration that measures 4 x 1 cm, with mild swelling, blanchable erythema, no active drainage, no probe to bone, no other signs of infection.   Vasculature:  Dorsalis Pedis pulse 1/4 bilateral. Posterior Tibial pulse  0/4 bilateral. Diminished from before. Capillary fill time <5 sec 1-5 bilateral.  Decreased hair growth to the level of the digits. Temperature gradient within normal limits. No varicosities present bilateral.   Neurology: The patient has diminished sensation measured with a 5.07/10g Semmes Weinstein Monofilament at all pedal sites bilateral . Vibratory sensation diminished bilateral with tuning fork. No Babinski sign present bilateral.   Musculoskeletal: Mild pain to palpation to left great toe. No tenderness with calf compression bilateral.  Assessment and Plan: Problem List Items Addressed This Visit      Cardiovascular and Mediastinum   PAD (peripheral artery disease) ([place])     Genitourinary   End-stage renal disease ([place])    Other Visit Diagnoses    Toe ulcer, left, with fat layer exposed ([place])    -  Primary   Diabetic polyneuropathy associated with type 2 diabetes mellitus ([place])         -Examined patient. -Discussed with patient treatment options for fibrotic left great toe wound -Mechanically debrided with a tissue nipper very lightly along the  less periphery of the fibrotic tissue to healthy bleeding margins hemostasis was achieved with manual pressure and then applied antibiotic cream advised patient  and wife to continue with Santyl and to dress daily with dry dressing -Continue with close vascular follow-up -Dispense postop shoe to avoid rubbing or irritation to the toes -Advised patient to monitor for worsening symptoms if present to return to office or to go to ER immediately -Patient to return in 2 weeks for continued wound care -Patient advised to call the office if any problems or questions arise in the meantime.  Landis Martins, DPM

## 2018-05-29 ENCOUNTER — Encounter: Payer: Medicare Other | Admitting: Vascular Surgery

## 2018-05-29 ENCOUNTER — Other Ambulatory Visit: Payer: Self-pay

## 2018-05-29 ENCOUNTER — Encounter: Payer: Self-pay | Admitting: Vascular Surgery

## 2018-05-29 ENCOUNTER — Ambulatory Visit (INDEPENDENT_AMBULATORY_CARE_PROVIDER_SITE_OTHER): Payer: Medicare Other | Admitting: Vascular Surgery

## 2018-05-29 VITALS — BP 142/79 | HR 67 | Temp 98.4°F | Resp 20 | Ht 72.0 in | Wt 183.0 lb

## 2018-05-29 DIAGNOSIS — I70219 Atherosclerosis of native arteries of extremities with intermittent claudication, unspecified extremity: Secondary | ICD-10-CM

## 2018-05-29 DIAGNOSIS — N186 End stage renal disease: Secondary | ICD-10-CM

## 2018-05-29 DIAGNOSIS — Z992 Dependence on renal dialysis: Secondary | ICD-10-CM

## 2018-05-29 MED ORDER — COLLAGENASE 250 UNIT/GM EX OINT: 1.0000 "application " | TOPICAL_OINTMENT | Freq: Every day | CUTANEOUS | 1 refills | Status: DC

## 2018-05-29 MED ORDER — SANTYL 250 UNIT/GM EX OINT: 1.0000 "application " | TOPICAL_OINTMENT | Freq: Every day | CUTANEOUS | 1 refills | Status: AC

## 2018-05-29 NOTE — Progress Notes (Signed)
Patient name: Jose Sellers MRN: [id number] DOB: [birth year] Sex: male  REASON FOR VISIT: Follow-up left femoral endarterectomy and Dacron patch angioplasty and left femoral to above-knee popliteal bypass with 6 mm Gore-Tex 05/04/2018  HPI: Jose Sellers is a 58 y.o. male here today for follow-up.  He had presented with critical limb ischemia with nonhealing ulceration of his left great toe.  He underwent the above bypass and was discharged home.  He has done well since discharge.  His wife is doing a great job of taking care of his ulceration on his great toe  Current Outpatient Medications  Medication Sig Dispense Refill  . amiodarone (PACERONE) 200 MG tablet Take 200 mg by mouth 2 (two) times daily.     Marland Kitchen aspirin 81 MG tablet Take 81 mg by mouth daily.     Marland Kitchen atorvastatin (LIPITOR) 40 MG tablet Take 40 mg by mouth 2 (two) times daily.     . B Complex-C-Folic Acid (DIALYVITE 062) 0.8 MG TABS Take 1 tablet by mouth daily.   4  . calcitRIOL (ROCALTROL) 0.25 MCG capsule Take 0.25 mcg by mouth every Monday, Wednesday, and Friday with hemodialysis.     Marland Kitchen esomeprazole (NEXIUM) 40 MG capsule Take 40 mg by mouth daily before breakfast.     . ferric citrate (AURYXIA) 1 GM 210 MG(Fe) tablet Take 420 mg by mouth 3 (three) times daily with meals.     Marland Kitchen FLUoxetine (PROZAC) 20 MG capsule Take 20 mg by mouth daily.     . furosemide (LASIX) 40 MG tablet Take 40 mg by mouth daily as needed for fluid or edema.     . gabapentin (NEURONTIN) 300 MG capsule Take 1 capsule (300 mg total) by mouth 3 (three) times daily. (Patient taking differently: Take 300 mg by mouth at bedtime. ) 90 capsule 3  . Insulin Glargine (LANTUS SOLOSTAR) 100 UNIT/ML Solostar Pen Inject 10 Units into the skin at bedtime.     . insulin lispro (HUMALOG KWIKPEN) 100 UNIT/ML KwikPen Inject 0-32 Units into the skin 3 (three) times daily. Sliding Scale Insulin     . levalbuterol (XOPENEX) 0.63 MG/3ML nebulizer  solution Inhale 0.63 mg into the lungs every 8 (eight) hours as needed for wheezing or shortness of breath.   1  . lidocaine-prilocaine (EMLA) cream Apply 1 application topically See admin instructions. Apply small amount to access site 1-2 hours prior to dialysis.  12  . lisinopril (PRINIVIL,ZESTRIL) 20 MG tablet     . metoprolol tartrate (LOPRESSOR) 25 MG tablet Take 12.5 mg by mouth 2 (two) times daily.     . mupirocin ointment (BACTROBAN) 2 %     . nitroGLYCERIN (NITROSTAT) 0.4 MG SL tablet Place 0.4 mg under the tongue every 5 (five) minutes as needed for chest pain.    Marland Kitchen oxycodone (ROXICODONE) 30 MG immediate release tablet Take 30 mg by mouth every 8 (eight) hours as needed for pain.     Marland Kitchen oxyCODONE-acetaminophen (PERCOCET) 7.5-325 MG tablet Take 1 tablet by mouth every 6 (six) hours as needed for severe pain. 25 tablet 0  . SANTYL ointment Apply 1 application topically daily. 15 g 1  . tiotropium (SPIRIVA) 18 MCG inhalation capsule Place 18 mcg into inhaler and inhale daily.     . collagenase (SANTYL) ointment Apply 1 application topically daily. 30 g 1   No current facility-administered medications for this visit.      PHYSICAL EXAM: Vitals:   05/29/18 0943  BP: Marland Kitchen)  142/79  Pulse: 67  Resp: 20  Temp: 98.4 F (36.9 C)  SpO2: 99%  Weight: 183 lb (83 kg)  Height: 6' (1.829 m)    GENERAL: The patient is a well-nourished male, in no acute distress. The vital signs are documented above. He does have a palpable popliteal pulse.  His left groin has a slight lymphocele.  No erythema.  His left above-knee popliteal incision has a slight eschar.  Also has an eschar at his old radiocephalic anastomosis on the right with no evidence of involvement of the vein below.  Does have some full-thickness loss on the medial aspect of his great toe and the tip of his great toe but no invasive infection.  MEDICAL ISSUES: Doing well overall.  We will continue local wound care with Santyl to his  toe.  I will see him again in 1 month with noninvasive follow-up   [name], MD [facility] Tel [phone] Pager [phone]

## 2018-05-31 ENCOUNTER — Ambulatory Visit: Payer: Medicare Other | Admitting: Sports Medicine

## 2018-06-07 ENCOUNTER — Other Ambulatory Visit: Payer: Self-pay

## 2018-06-07 ENCOUNTER — Ambulatory Visit: Payer: Medicare Other | Admitting: Sports Medicine

## 2018-06-07 ENCOUNTER — Encounter: Payer: Self-pay | Admitting: Sports Medicine

## 2018-06-07 VITALS — BP 152/105 | HR 78 | Temp 96.8°F | Resp 16

## 2018-06-07 DIAGNOSIS — E1142 Type 2 diabetes mellitus with diabetic polyneuropathy: Secondary | ICD-10-CM | POA: Diagnosis not present

## 2018-06-07 DIAGNOSIS — F17210 Nicotine dependence, cigarettes, uncomplicated: Secondary | ICD-10-CM | POA: Diagnosis not present

## 2018-06-07 DIAGNOSIS — J449 Chronic obstructive pulmonary disease, unspecified: Secondary | ICD-10-CM | POA: Diagnosis not present

## 2018-06-07 DIAGNOSIS — L7682 Other postprocedural complications of skin and subcutaneous tissue: Secondary | ICD-10-CM | POA: Diagnosis not present

## 2018-06-07 DIAGNOSIS — L97522 Non-pressure chronic ulcer of other part of left foot with fat layer exposed: Secondary | ICD-10-CM | POA: Diagnosis not present

## 2018-06-07 DIAGNOSIS — N186 End stage renal disease: Secondary | ICD-10-CM

## 2018-06-07 DIAGNOSIS — I509 Heart failure, unspecified: Secondary | ICD-10-CM | POA: Insufficient documentation

## 2018-06-07 DIAGNOSIS — I739 Peripheral vascular disease, unspecified: Secondary | ICD-10-CM

## 2018-06-07 DIAGNOSIS — I132 Hypertensive heart and chronic kidney disease with heart failure and with stage 5 chronic kidney disease, or end stage renal disease: Secondary | ICD-10-CM | POA: Insufficient documentation

## 2018-06-07 DIAGNOSIS — E119 Type 2 diabetes mellitus without complications: Secondary | ICD-10-CM | POA: Diagnosis not present

## 2018-06-07 DIAGNOSIS — L89620 Pressure ulcer of left heel, unstageable: Secondary | ICD-10-CM

## 2018-06-07 NOTE — Progress Notes (Signed)
Subjective: Jose Sellers is a 58 y.o. male patient with history of diabetes who presents to office today for evaluation of left great toe wound and is assisted by wife who reports that she thinks it is getting better and just noticed today a new wound on his heel possibly from resting heel down during dialysis.  Patient denies nausea vomiting fever chills warmth or any purulence to the sites. Reports pain every now and again 5/10.  Patient is diabetic blood sugar this morning not recorded but yesterday was 118, last A1c 7.    Patient Active Problem List   Diagnosis Date Noted  . PAD (peripheral artery disease) ([place]) 05/04/2018  . AICD (automatic cardioverter/defibrillator) present   . Typical atrial flutter ([place]) 10/16/2017  . Acute respiratory failure with hypoxia ([place]) 09/08/2017  . C. difficile diarrhea 09/08/2017  . Peritonitis associated with peritoneal dialysis ([place]) 08/16/2017  . At high risk for falls 07/27/2017  . Arteriosclerotic vascular disease 03/16/2017  . Chronic heart disease 03/16/2017  . End-stage renal disease ([place]) 03/16/2017  . Hypercholesterolemia 03/16/2017  . Hypertensive disorder 03/16/2017  . Anemia of unknown etiology 07/07/2016  . Malaise and fatigue 07/07/2016  . Chronic pain syndrome 06/20/2016  . Secondary hyperparathyroidism ([place]) 12/16/2015  . Stage 5 chronic kidney disease ([place]) 12/16/2015  . Chronic systolic congestive heart failure ([place]) 11/25/2015  . Tobacco use 11/25/2015  . Ventral hernia without obstruction or gangrene 10/26/2015  . Psychosexual dysfunction with inhibited sexual excitement 08/27/2015  . Anxiety 08/26/2015  . COPD (chronic obstructive pulmonary disease) ([place]) 08/26/2015  . DDD (degenerative disc disease), lumbosacral 08/26/2015  . GERD (gastroesophageal reflux disease) 08/26/2015  . Hypothyroidism 08/26/2015  . Microalbuminuria 08/26/2015  . Mixed hyperlipidemia 08/26/2015  . Osteoarthritis of multiple joints 08/26/2015  .  Peripheral vascular disease ([place]) 08/26/2015  . Abdominal hernia 06/16/2015  . CAD (coronary artery disease) 04/07/2015  . 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 OSA (obstructive sleep apnea) 04/07/2015  . Essential hypertension 03/06/2015  . Retinopathy, diabetic, bilateral ([place]) 03/06/2015  . Uncontrolled type 2 diabetes mellitus with diabetic polyneuropathy, with long-term current use of insulin ([place]) 03/06/2015  . Atherosclerosis with claudication of extremity ([place]) 08/01/2014  . Mild episode of recurrent major depressive disorder ([place]) 05/01/2013  . Panic disorder 05/01/2013   Current Outpatient Medications on File Prior to Visit  Medication Sig Dispense Refill  . amiodarone (PACERONE) 200 MG tablet Take 200 mg by mouth 2 (two) times daily.     Marland Kitchen aspirin 81 MG tablet Take 81 mg by mouth daily.     Marland Kitchen atorvastatin (LIPITOR) 40 MG tablet Take 40 mg by mouth 2 (two) times daily.     . B Complex-C-Folic Acid (DIALYVITE 741) 0.8 MG TABS Take 1 tablet by mouth daily.   4  . calcitRIOL (ROCALTROL) 0.25 MCG capsule Take 0.25 mcg by mouth every Monday, Wednesday, and Friday with hemodialysis.     Marland Kitchen collagenase (SANTYL) ointment Apply 1 application topically daily. 30 g 1  . esomeprazole (NEXIUM) 40 MG capsule Take 40 mg by mouth daily before breakfast.     . ferric citrate (AURYXIA) 1 GM 210 MG(Fe) tablet Take 420 mg by mouth 3 (three) times daily with meals.     Marland Kitchen FLUoxetine (PROZAC) 20 MG capsule Take 20 mg by mouth daily.     . furosemide (LASIX) 40 MG tablet Take 40 mg by mouth daily as needed for fluid or edema.     . gabapentin (NEURONTIN) 300  MG capsule Take 1 capsule (300 mg total) by mouth 3 (three) times daily. (Patient taking differently: Take 300 mg by mouth at bedtime. ) 90 capsule 3  . Insulin Glargine (LANTUS SOLOSTAR) 100 UNIT/ML Solostar Pen Inject 10 Units into the skin at bedtime.     . insulin lispro (HUMALOG KWIKPEN) 100 UNIT/ML KwikPen Inject  0-32 Units into the skin 3 (three) times daily. Sliding Scale Insulin     . levalbuterol (XOPENEX) 0.63 MG/3ML nebulizer solution Inhale 0.63 mg into the lungs every 8 (eight) hours as needed for wheezing or shortness of breath.   1  . lidocaine-prilocaine (EMLA) cream Apply 1 application topically See admin instructions. Apply small amount to access site 1-2 hours prior to dialysis.  12  . lisinopril (PRINIVIL,ZESTRIL) 20 MG tablet     . metoprolol tartrate (LOPRESSOR) 25 MG tablet Take 12.5 mg by mouth 2 (two) times daily.     . mupirocin ointment (BACTROBAN) 2 %     . nitroGLYCERIN (NITROSTAT) 0.4 MG SL tablet Place 0.4 mg under the tongue every 5 (five) minutes as needed for chest pain.    Marland Kitchen oxycodone (ROXICODONE) 30 MG immediate release tablet Take 30 mg by mouth every 8 (eight) hours as needed for pain.     Marland Kitchen oxyCODONE-acetaminophen (PERCOCET) 7.5-325 MG tablet Take 1 tablet by mouth every 6 (six) hours as needed for severe pain. 25 tablet 0  . SANTYL ointment Apply 1 application topically daily. 15 g 1  . tiotropium (SPIRIVA) 18 MCG inhalation capsule Place 18 mcg into inhaler and inhale daily.      No current facility-administered medications on file prior to visit.    Allergies  Allergen Reactions  . Albuterol Palpitations    SVT  . Prednisone Shortness Of Breath    Wife stated that patient develops breathing issues.  . Fluticasone Furoate-Vilanterol Palpitations  . Morphine Rash    Recent Results (from the past 2160 hour(s))  I-STAT 4, (NA,K, GLUC, HGB,HCT)     Status: Abnormal   Collection Time: 04/20/18 10:56 AM  Result Value Ref Range   Sodium 133 (L) 135 - 145 mmol/L   Potassium 5.0 3.5 - 5.1 mmol/L   Glucose, Bld 116 (H) 70 - 99 mg/dL   HCT 39.0 39.0 - 52.0 %   Hemoglobin 13.3 13.0 - 17.0 g/dL  I-STAT creatinine     Status: Abnormal   Collection Time: 04/20/18 10:57 AM  Result Value Ref Range   Creatinine, Ser 6.10 (H) 0.61 - 1.24 mg/dL  Glucose, capillary      Status: Abnormal   Collection Time: 04/20/18 12:41 PM  Result Value Ref Range   Glucose-Capillary 109 (H) 70 - 99 mg/dL  Glucose, capillary     Status: Abnormal   Collection Time: 05/01/18 10:21 AM  Result Value Ref Range   Glucose-Capillary 158 (H) 70 - 99 mg/dL  APTT     Status: Abnormal   Collection Time: 05/01/18 11:46 AM  Result Value Ref Range   aPTT 37 (H) 24 - 36 seconds    Comment:        IF BASELINE aPTT IS ELEVATED, SUGGEST PATIENT RISK ASSESSMENT BE USED TO DETERMINE APPROPRIATE ANTICOAGULANT THERAPY. Performed at [facility], [place] [address] [place] [address]   CBC     Status: Abnormal   Collection Time: 05/01/18 11:46 AM  Result Value Ref Range   WBC 4.7 4.0 - 10.5 K/uL   RBC 4.57 4.22 - 5.81 MIL/uL  Hemoglobin 11.9 (L) 13.0 - 17.0 g/dL   HCT 40.2 39.0 - 52.0 %   MCV 88.0 80.0 - 100.0 fL   MCH 26.0 26.0 - 34.0 pg   MCHC 29.6 (L) 30.0 - 36.0 g/dL   RDW 16.2 (H) 11.5 - 15.5 %   Platelets 122 (L) 150 - 400 K/uL    Comment: REPEATED TO VERIFY SPECIMEN CHECKED FOR CLOTS    nRBC 0.0 0.0 - 0.2 %    Comment: Performed at [facility], [place] [address] [place], [place] [address]  Comprehensive metabolic panel     Status: Abnormal   Collection Time: 05/01/18 11:46 AM  Result Value Ref Range   Sodium 134 (L) 135 - 145 mmol/L   Potassium 4.3 3.5 - 5.1 mmol/L   Chloride 94 (L) 98 - 111 mmol/L   CO2 25 22 - 32 mmol/L   Glucose, Bld 154 (H) 70 - 99 mg/dL   BUN 31 (H) 6 - 20 mg/dL   Creatinine, Ser 5.54 (H) 0.61 - 1.24 mg/dL   Calcium 9.4 8.9 - 10.3 mg/dL   Total Protein 6.5 6.5 - 8.1 g/dL   Albumin 3.0 (L) 3.5 - 5.0 g/dL   AST 24 15 - 41 U/L   ALT 18 0 - 44 U/L   Alkaline Phosphatase 123 38 - 126 U/L   Total Bilirubin 1.5 (H) 0.3 - 1.2 mg/dL   GFR calc non Af Amer 11 (L) >60 mL/min   GFR calc Af Amer 12 (L) >60 mL/min   Anion gap 15 5 - 15    Comment: Performed at [facility], [place] [address] [place], [place] [address]  Protime-INR      Status: Abnormal   Collection Time: 05/01/18 11:46 AM  Result Value Ref Range   Prothrombin Time 15.4 (H) 11.4 - 15.2 seconds   INR 1.23     Comment: Performed at [facility] [address] [place], [place] [address]  Urinalysis, Routine w reflex microscopic     Status: Abnormal   Collection Time: 05/01/18 11:46 AM  Result Value Ref Range   Color, Urine AMBER (A) YELLOW    Comment: BIOCHEMICALS MAY BE AFFECTED BY COLOR   APPearance CLEAR CLEAR   Specific Gravity, Urine 1.019 1.005 - 1.030   pH 5.0 5.0 - 8.0   Glucose, UA 50 (A) NEGATIVE mg/dL   Hgb urine dipstick MODERATE (A) NEGATIVE   Bilirubin Urine NEGATIVE NEGATIVE   Ketones, ur NEGATIVE NEGATIVE mg/dL   Protein, ur 100 (A) NEGATIVE mg/dL   Nitrite NEGATIVE NEGATIVE   Leukocytes, UA NEGATIVE NEGATIVE   RBC / HPF 0-5 0 - 5 RBC/hpf   WBC, UA 6-10 0 - 5 WBC/hpf   Bacteria, UA RARE (A) NONE SEEN   Mucus PRESENT    Hyaline Casts, UA PRESENT    Granular Casts, UA PRESENT     Comment: Performed at [facility], [address] [place], [place] [address]  Surgical pcr screen     Status: Abnormal   Collection Time: 05/01/18 11:46 AM  Result Value Ref Range   MRSA, PCR POSITIVE (A) NEGATIVE    Comment: RESULT CALLED TO, READ BACK BY AND VERIFIED WITH: Jesse Sans NT 13:50 05/01/18 ([email or web address])    Staphylococcus aureus POSITIVE (A) NEGATIVE    Comment: (NOTE) The Xpert SA Assay (FDA approved for NASAL specimens in patients 80 years of age and older), is one component of a comprehensive surveillance program. It  is not intended to diagnose infection nor to guide or monitor treatment. Performed at [facility], [place] [address] [place], [place] [address]   Hemoglobin A1c     Status: Abnormal   Collection Time: 05/01/18 11:47 AM  Result Value Ref Range   Hgb A1c MFr Bld 6.0 (H) 4.8 - 5.6 %    Comment: (NOTE) Pre diabetes:          5.7%-6.4% Diabetes:              >6.4% Glycemic control for   <7.0% adults with  diabetes    Mean Plasma Glucose 125.5 mg/dL    Comment: Performed at [facility] [address], [place], [place] [address]  Type and screen     Status: None   Collection Time: 05/01/18 11:50 AM  Result Value Ref Range   ABO/RH(D) O POS    Antibody Screen NEG    Sample Expiration 05/15/2018    Extend sample reason      NO TRANSFUSIONS OR PREGNANCY IN THE PAST 3 MONTHS Performed at [facility], [place] [address], [place], [place] [address]   ABO/Rh     Status: None   Collection Time: 05/01/18 11:50 AM  Result Value Ref Range   ABO/RH(D)      O POS Performed at [facility] [address], [place], [place] [address]   Glucose, capillary     Status: Abnormal   Collection Time: 05/04/18  5:52 AM  Result Value Ref Range   Glucose-Capillary 148 (H) 70 - 99 mg/dL  I-STAT 4, (NA,K, GLUC, HGB,HCT)     Status: Abnormal   Collection Time: 05/04/18  6:30 AM  Result Value Ref Range   Sodium 132 (L) 135 - 145 mmol/L   Potassium 4.0 3.5 - 5.1 mmol/L   Glucose, Bld 151 (H) 70 - 99 mg/dL   HCT 40.0 39.0 - 52.0 %   Hemoglobin 13.6 13.0 - 17.0 g/dL  Glucose, capillary     Status: Abnormal   Collection Time: 05/04/18 12:17 PM  Result Value Ref Range   Glucose-Capillary 115 (H) 70 - 99 mg/dL   Comment 1 Notify RN    Comment 2 Document in Chart   CBC     Status: Abnormal   Collection Time: 05/04/18  3:01 PM  Result Value Ref Range   WBC 4.5 4.0 - 10.5 K/uL   RBC 3.71 (L) 4.22 - 5.81 MIL/uL   Hemoglobin 9.9 (L) 13.0 - 17.0 g/dL    Comment: REPEATED TO VERIFY   HCT 32.1 (L) 39.0 - 52.0 %   MCV 86.5 80.0 - 100.0 fL   MCH 26.7 26.0 - 34.0 pg   MCHC 30.8 30.0 - 36.0 g/dL   RDW 15.9 (H) 11.5 - 15.5 %   Platelets 112 (L) 150 - 400 K/uL    Comment: REPEATED TO VERIFY PLATELET COUNT CONFIRMED BY SMEAR Immature Platelet Fraction may be clinically indicated, consider ordering this additional test [id number]    nRBC 0.0 0.0 - 0.2 %    Comment: Performed at [facility], [place] [address], [place], [place] [address]  Creatinine, serum     Status: Abnormal   Collection Time: 05/04/18  3:01 PM  Result Value Ref Range   Creatinine, Ser 5.74 (H) 0.61 - 1.24 mg/dL   GFR calc non Af Amer 10 (L) >60 mL/min   GFR calc Af Amer 12 (L) >60 mL/min  Comment: Performed at [facility], [place] [address], [place], [place] [address]  Glucose, capillary     Status: Abnormal   Collection Time: 05/04/18  3:27 PM  Result Value Ref Range   Glucose-Capillary 122 (H) 70 - 99 mg/dL   Comment 1 Notify RN   Renal function panel     Status: Abnormal   Collection Time: 05/04/18  5:00 PM  Result Value Ref Range   Sodium 136 135 - 145 mmol/L   Potassium 4.6 3.5 - 5.1 mmol/L   Chloride 97 (L) 98 - 111 mmol/L   CO2 24 22 - 32 mmol/L   Glucose, Bld 148 (H) 70 - 99 mg/dL   BUN 36 (H) 6 - 20 mg/dL   Creatinine, Ser 6.33 (H) 0.61 - 1.24 mg/dL   Calcium 8.6 (L) 8.9 - 10.3 mg/dL   Phosphorus 6.0 (H) 2.5 - 4.6 mg/dL   Albumin 2.6 (L) 3.5 - 5.0 g/dL   GFR calc non Af Amer 9 (L) >60 mL/min   GFR calc Af Amer 10 (L) >60 mL/min   Anion gap 15 5 - 15    Comment: Performed at [facility], [address], [place], [place] [address]  Glucose, capillary     Status: Abnormal   Collection Time: 05/04/18  9:45 PM  Result Value Ref Range   Glucose-Capillary 155 (H) 70 - 99 mg/dL  Glucose, capillary     Status: Abnormal   Collection Time: 05/05/18  6:06 AM  Result Value Ref Range   Glucose-Capillary 110 (H) 70 - 99 mg/dL  CBC     Status: Abnormal   Collection Time: 05/05/18  8:36 AM  Result Value Ref Range   WBC 8.4 4.0 - 10.5 K/uL   RBC 4.14 (L) 4.22 - 5.81 MIL/uL   Hemoglobin 11.0 (L) 13.0 - 17.0 g/dL   HCT 35.4 (L) 39.0 - 52.0 %   MCV 85.5 80.0 - 100.0 fL   MCH 26.6 26.0 - 34.0 pg   MCHC 31.1 30.0 - 36.0 g/dL   RDW 15.9 (H) 11.5 - 15.5 %   Platelets 131 (L) 150 - 400 K/uL   nRBC 0.0 0.0 - 0.2 %    Comment: Performed at [facility], [place] [address]  [place], [place] [address]  Basic metabolic panel     Status: Abnormal   Collection Time: 05/05/18  8:36 AM  Result Value Ref Range   Sodium 136 135 - 145 mmol/L   Potassium 4.0 3.5 - 5.1 mmol/L   Chloride 97 (L) 98 - 111 mmol/L   CO2 25 22 - 32 mmol/L   Glucose, Bld 92 70 - 99 mg/dL   BUN 19 6 - 20 mg/dL   Creatinine, Ser 4.54 (H) 0.61 - 1.24 mg/dL   Calcium 8.7 (L) 8.9 - 10.3 mg/dL   GFR calc non Af Amer 13 (L) >60 mL/min   GFR calc Af Amer 16 (L) >60 mL/min   Anion gap 14 5 - 15    Comment: Performed at [facility] [address] [place], [place] [address]  Glucose, capillary     Status: Abnormal   Collection Time: 05/05/18 11:22 AM  Result Value Ref Range   Glucose-Capillary 67 (L) 70 - 99 mg/dL  Glucose, capillary     Status: None   Collection Time: 05/05/18 12:03 PM  Result Value Ref Range   Glucose-Capillary 79 70 - 99 mg/dL  Glucose, capillary  Status: Abnormal   Collection Time: 05/05/18  4:01 PM  Result Value Ref Range   Glucose-Capillary 129 (H) 70 - 99 mg/dL  Glucose, capillary     Status: None   Collection Time: 05/05/18  8:09 PM  Result Value Ref Range   Glucose-Capillary 98 70 - 99 mg/dL  Glucose, capillary     Status: Abnormal   Collection Time: 05/05/18  9:57 PM  Result Value Ref Range   Glucose-Capillary 107 (H) 70 - 99 mg/dL  Glucose, capillary     Status: None   Collection Time: 05/06/18  6:14 AM  Result Value Ref Range   Glucose-Capillary 94 70 - 99 mg/dL  Glucose, capillary     Status: Abnormal   Collection Time: 05/06/18 11:06 AM  Result Value Ref Range   Glucose-Capillary 132 (H) 70 - 99 mg/dL    Objective: General: Patient is awake, alert, and oriented x 3 and in no acute distress.  Integument: Skin is warm, dry and supple bilateral.  Left hallux nailbed is clean dry and intact however to the distal tuft and medial aspect of the left great toe there is a fibrotic-escharotic ulceration that measures 3 x 1 cm once debrided reveal granular  fatty tissue with mild swelling, blanchable erythema, no active drainage, no probe to bone, no other signs of infection.   To left heel there is a eschar that measures 2x2cm with no active drainage, no redness, no warmth, no signs of infection.   Vasculature:  Dorsalis Pedis pulse 1/4 bilateral. Posterior Tibial pulse  0/4 bilateral. Diminished from before. Capillary fill time <5 sec 1-5 bilateral.  Decreased hair growth to the level of the digits. Temperature gradient within normal limits. No varicosities present bilateral.   Neurology: The patient has diminished sensation measured with a 5.07/10g Semmes Weinstein Monofilament at all pedal sites bilateral . Vibratory sensation diminished bilateral with tuning fork. No Babinski sign present bilateral.   Musculoskeletal: Mild pain to palpation to left great toe. No tenderness with calf compression bilateral.  Assessment and Plan: Problem List Items Addressed This Visit      Cardiovascular and Mediastinum   PAD (peripheral artery disease) ([place])     Genitourinary   End-stage renal disease ([place])    Other Visit Diagnoses    Toe ulcer, left, with fat layer exposed ([place])    -  Primary   Pressure ulcer of heel, left, unstageable ([place])       Diabetic polyneuropathy associated with type 2 diabetes mellitus ([place])         -Examined patient. -Discussed with patient treatment options for fibrotic left great toe wound -Mechanically debrided with a tissue nipper at left 1st toe to healthy bleeding margins hemostasis was achieved with manual pressure and then applied antibiotic cream advised patient and wife to continue with Santyl and to dress daily with dry dressing -Did not debride the new heel eschar. Applied guaze padding and advised patient to apply Santyl when get home and to avoid pressure to heel on left especially when at dialysis because this can be the cause -Continue with close vascular follow-up -Added padding to post op shoe -Advised  patient to monitor for worsening symptoms if present to return to office or to go to ER immediately -Patient to return in 2 weeks for continued wound care -Patient advised to call the office if any problems or questions arise in the meantime.  Landis Martins, DPM

## 2018-06-08 ENCOUNTER — Emergency Department (HOSPITAL_COMMUNITY)
Admission: EM | Admit: 2018-06-08 | Discharge: 2018-06-08 | Disposition: A | Discharge status: Home / Self Care | Payer: Medicare Other | Attending: Emergency Medicine | Admitting: Emergency Medicine

## 2018-06-08 ENCOUNTER — Encounter (HOSPITAL_COMMUNITY): Payer: Self-pay

## 2018-06-08 ENCOUNTER — Other Ambulatory Visit: Payer: Self-pay

## 2018-06-08 DIAGNOSIS — T8130XA Disruption of wound, unspecified, initial encounter: Secondary | ICD-10-CM

## 2018-06-08 DIAGNOSIS — L7682 Other postprocedural complications of skin and subcutaneous tissue: Secondary | ICD-10-CM

## 2018-06-08 LAB — CBC WITH DIFFERENTIAL/PLATELET
Abs Immature Granulocytes: 0 10*3/uL (ref 0.00–0.07)
Basophils Absolute: 0 10*3/uL (ref 0.0–0.1)
Basophils Relative: 1 %
Eosinophils Absolute: 0 10*3/uL (ref 0.0–0.5)
Eosinophils Relative: 1 %
HCT: 36.3 % — ABNORMAL LOW (ref 39.0–52.0)
Hemoglobin: 11.1 g/dL — ABNORMAL LOW (ref 13.0–17.0)
IMMATURE GRANULOCYTES: 0 %
Lymphocytes Relative: 19 %
Lymphs Abs: 1 10*3/uL (ref 0.7–4.0)
MCH: 25.9 pg — ABNORMAL LOW (ref 26.0–34.0)
MCHC: 30.6 g/dL (ref 30.0–36.0)
MCV: 84.8 fL (ref 80.0–100.0)
Monocytes Absolute: 0.4 10*3/uL (ref 0.1–1.0)
Monocytes Relative: 8 %
NEUTROS PCT: 71 %
Neutro Abs: 3.8 10*3/uL (ref 1.7–7.7)
Platelets: 120 10*3/uL — ABNORMAL LOW (ref 150–400)
RBC: 4.28 MIL/uL (ref 4.22–5.81)
RDW: 17.5 % — ABNORMAL HIGH (ref 11.5–15.5)
WBC: 5.3 10*3/uL (ref 4.0–10.5)
nRBC: 0 % (ref 0.0–0.2)

## 2018-06-08 LAB — COMPREHENSIVE METABOLIC PANEL
ALT: 18 U/L (ref 0–44)
AST: 21 U/L (ref 15–41)
Albumin: 2.9 g/dL — ABNORMAL LOW (ref 3.5–5.0)
Alkaline Phosphatase: 125 U/L (ref 38–126)
Anion gap: 11 (ref 5–15)
BILIRUBIN TOTAL: 1.4 mg/dL — AB (ref 0.3–1.2)
BUN: 40 mg/dL — ABNORMAL HIGH (ref 6–20)
CALCIUM: 9.6 mg/dL (ref 8.9–10.3)
CO2: 28 mmol/L (ref 22–32)
Chloride: 96 mmol/L — ABNORMAL LOW (ref 98–111)
Creatinine, Ser: 5.46 mg/dL — ABNORMAL HIGH (ref 0.61–1.24)
GFR calc Af Amer: 12 mL/min — ABNORMAL LOW (ref >60)
GFR calc non Af Amer: 11 mL/min — ABNORMAL LOW (ref >60)
Glucose, Bld: 156 mg/dL — ABNORMAL HIGH (ref 70–99)
Potassium: 4.3 mmol/L (ref 3.5–5.1)
Sodium: 135 mmol/L (ref 135–145)
Total Protein: 6.6 g/dL (ref 6.5–8.1)

## 2018-06-08 LAB — LACTIC ACID, PLASMA: Lactic Acid, Venous: 1.3 mmol/L (ref 0.5–1.9)

## 2018-06-08 MED ORDER — SODIUM CHLORIDE 0.9% FLUSH: 3.0000 mL | Freq: Once | INTRAVENOUS | Status: DC

## 2018-06-08 NOTE — ED Triage Notes (Signed)
Pt had operation on the left thigh 3 weeks ago and began to have bleeding from the wound.  Seen some oozing of white discharge from the wound.  Wound rewrapped in triage. A&Ox4

## 2018-06-08 NOTE — ED Provider Notes (Signed)
Emergency Department Provider Note   I have reviewed the triage vital signs and the nursing notes.   HISTORY  Chief Complaint Post-op Problem   HPI Jose Sellers is a 58 y.o. male with multiple medical problems documented below the presents to the emergency department today secondary to wound problem.  Patient states that he had a surgery done about a month ago by Dr. Donnetta Hutching with a endarterectomy and a graft placed and the wound has dehisced and had some clear drainage. (left femoral endarterectomy with patch angioplasty and left femoral to above-the-knee popliteal bypass with PTFE graft by Dr. early on 05/04/2018). No fevers, nausea, vomiting, malaise or foul-smelling drainage.  This happened just earlier today. No other associated or modifying symptoms.    Past Medical History:  Diagnosis Date  . A-fib ([place])   . AICD (automatic cardioverter/defibrillator) present   . Anal fissure   . Anxiety   . Asthma   . CAD (coronary artery disease)   . CHF (congestive heart failure) ([place])   . COPD (chronic obstructive pulmonary disease) ([place])   . Degeneration of lumbar or lumbosacral intervertebral disc   . DM (diabetes mellitus), type 2, uncontrolled w/neurologic complication ([place])   . Dyspnea    with exertion  . Dysthymic disorder   . End stage renal disease on dialysis [facility])    dialysis M-W-F   [address] in [place]  . Essential (primary) hypertension   . GERD (gastroesophageal reflux disease)   . History of hiatal hernia   . Hx of colonic polyps   . Hyperlipemia   . Myocardial infarction ([place])   . Peripheral vascular disease ([place])   . Pneumonia   . Recurrent major depression ([place])   . Sleep apnea   . Ventral hernia without obstruction or gangrene   . Wears dentures     Patient Active Problem List   Diagnosis Date Noted  . PAD (peripheral artery disease) ([place]) 05/04/2018  . AICD (automatic cardioverter/defibrillator) present   . Typical atrial flutter ([place])  10/16/2017  . Acute respiratory failure with hypoxia ([place]) 09/08/2017  . C. difficile diarrhea 09/08/2017  . Peritonitis associated with peritoneal dialysis ([place]) 08/16/2017  . At high risk for falls 07/27/2017  . Arteriosclerotic vascular disease 03/16/2017  . Chronic heart disease 03/16/2017  . End-stage renal disease ([place]) 03/16/2017  . Hypercholesterolemia 03/16/2017  . Hypertensive disorder 03/16/2017  . Anemia of unknown etiology 07/07/2016  . Malaise and fatigue 07/07/2016  . Chronic pain syndrome 06/20/2016  . Secondary hyperparathyroidism ([place]) 12/16/2015  . Stage 5 chronic kidney disease ([place]) 12/16/2015  . Chronic systolic congestive heart failure ([place]) 11/25/2015  . Tobacco use 11/25/2015  . Ventral hernia without obstruction or gangrene 10/26/2015  . Psychosexual dysfunction with inhibited sexual excitement 08/27/2015  . Anxiety 08/26/2015  . COPD (chronic obstructive pulmonary disease) ([place]) 08/26/2015  . DDD (degenerative disc disease), lumbosacral 08/26/2015  . GERD (gastroesophageal reflux disease) 08/26/2015  . Hypothyroidism 08/26/2015  . Microalbuminuria 08/26/2015  . Mixed hyperlipidemia 08/26/2015  . Osteoarthritis of multiple joints 08/26/2015  . Peripheral vascular disease ([place]) 08/26/2015  . Abdominal hernia 06/16/2015  . CAD (coronary artery disease) 04/07/2015  . 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 OSA (obstructive sleep apnea) 04/07/2015  . Essential hypertension 03/06/2015  . Retinopathy, diabetic, bilateral ([place]) 03/06/2015  . Uncontrolled type 2 diabetes mellitus with diabetic polyneuropathy, with long-term current use of insulin ([place]) 03/06/2015  . Atherosclerosis with claudication of extremity ([place]) 08/01/2014  .  Mild episode of recurrent major depressive disorder ([place]) 05/01/2013  . Panic disorder 05/01/2013    Past Surgical History:  Procedure Laterality Date  . ABDOMINAL AORTOGRAM W/LOWER EXTREMITY N/A  04/20/2018   Procedure: ABDOMINAL AORTOGRAM W/LOWER EXTREMITY;  Surgeon: Elam Dutch, MD;  Location: [facility];  Service: Cardiovascular;  Laterality: N/A;  . AV FISTULA PLACEMENT Right 02/13/2018   Procedure: CREATION OF RADIOCEPHALIC ARTERIOVENOUS FISTULA RIGHT ARM;  Surgeon: Elam Dutch, MD;  Location: [facility];  Service: Vascular;  Laterality: Right;  . CAD s/p cabbage    . CARDIAC DEFIBRILLATOR PLACEMENT  2012  . CATARACT EXTRACTION W/ INTRAOCULAR LENS IMPLANT    . COLONOSCOPY  12/27/2013   Colonic polyps status post polypectomy.  Mild sigmoid diverticulosis. Internal hemorrhoids. Tubular adenoma.   . CORONARY ANGIOPLASTY    . CORONARY STENT PLACEMENT    . ENDARTERECTOMY Left 05/04/2018   Procedure: LEFT FEMORAL ENDARTERECTOMY;  Surgeon: [name], MD;  Location: [facility] OR;  Service: Vascular;  Laterality: Left;  . ESOPHAGOGASTRODUODENOSCOPY  12/01/2016   Mild gastritis.   . FEMORAL-POPLITEAL BYPASS GRAFT Left 05/04/2018   Procedure: LEFT FEMORAL TO ABOVE KNEE POPLITEAL BYPASS GRAFT USING 6MM X 80CM GORE PROPATEN RINGED GRAFT;  Surgeon: [name], MD;  Location: [facility] OR;  Service: Vascular;  Laterality: Left;  . heart bypass    . HERNIA REPAIR    . KNEE SURGERY    . PATCH ANGIOPLASTY Left 05/04/2018   Procedure: Left Common Femoral/Profunda Patch Angioplasty using Hemashield Platinum Finesse Patch;  Surgeon: [name], MD;  Location: [facility];  Service: Vascular;  Laterality: Left;    Current Outpatient Rx  . Order #: [id number] Class: Historical Med  . Order #: [id number] Class: Historical Med  . Order #: [id number] Class: Historical Med  . Order #: [id number] Class: Historical Med  . Order #: [id number] Class: Historical Med  . Order #: [id number] Class: Historical Med  . Order #: [id number] Class: Historical Med  . Order #: [id number] Class: Historical Med  . Order #: [id number] Class: Historical Med  . Order #: [id number] Class: Normal  . Order #: [id number] Class: Historical Med  . Order #:  [id number] Class: Historical Med  . Order #: [id number] Class: Historical Med  . Order #: [id number] Class: Historical Med  . Order #: [id number] Class: Historical Med  . Order #: [id number] Class: Historical Med  . Order #: [id number] Class: Historical Med  . Order #: [id number] Class: Normal  . Order #: [id number] Class: Normal  . Order #: [id number] Class: Historical Med    Allergies Albuterol; Prednisone; Fluticasone furoate-vilanterol; and Morphine  Family History  Problem Relation Age of Onset  . Colon cancer Neg Hx     Social History Social History   Tobacco Use  . Smoking status: Current Some Day Smoker    Packs/day: 0.25    Years: 35.00    Pack years: 8.75    Types: Cigarettes  . Smokeless tobacco: Never Used  Substance Use Topics  . Alcohol use: Not Currently  . Drug use: Never    Review of Systems  All other systems negative except as documented in the HPI. All pertinent positives and negatives as reviewed in the HPI. ____________________________________________   PHYSICAL EXAM:  VITAL SIGNS: ED Triage Vitals  Enc Vitals Group     BP 06/08/18 0011 (!) 145/80     Pulse Rate 06/08/18 0011 77     Resp 06/08/18 0011 14     Temp 06/08/18 0011 99.4 F (37.4 C)     Temp Source 06/08/18 0011 Oral  SpO2 06/08/18 0011 95 %    Constitutional: Alert and oriented. Well appearing and in no acute distress. Eyes: Conjunctivae are normal. PERRL. EOMI. Head: Atraumatic. Nose: No congestion/rhinnorhea. Mouth/Throat: Mucous membranes are moist.  Oropharynx non-erythematous. Neck: No stridor.  No meningeal signs.   Cardiovascular: Normal rate, regular rhythm. Good peripheral circulation. Grossly normal heart sounds.   Respiratory: Normal respiratory effort.  No retractions. Lungs CTAB. Gastrointestinal: Soft and nontender. No distention.  Musculoskeletal: No lower extremity tenderness nor edema. No gross deformities of extremities. Neurologic:  Normal speech and language. No gross  focal neurologic deficits are appreciated.  Skin:  Skin is warm, dry has a 10 cm wound to left medial thigh with 3-4 cm dehiscence with clear drainage, no surrounding erythema, warmth or streaking. Mild induration. No rash noted.  ____________________________________________   LABS (all labs ordered are listed, but only abnormal results are displayed)  Labs Reviewed  COMPREHENSIVE METABOLIC PANEL - Abnormal; Notable for the following components:      Result Value   Chloride 96 (*)    Glucose, Bld 156 (*)    BUN 40 (*)    Creatinine, Ser 5.46 (*)    Albumin 2.9 (*)    Total Bilirubin 1.4 (*)    GFR calc non Af Amer 11 (*)    GFR calc Af Amer 12 (*)    All other components within normal limits  CBC WITH DIFFERENTIAL/PLATELET - Abnormal; Notable for the following components:   Hemoglobin 11.1 (*)    HCT 36.3 (*)    MCH 25.9 (*)    RDW 17.5 (*)    Platelets 120 (*)    All other components within normal limits  LACTIC ACID, PLASMA   ____________________________________________    INITIAL IMPRESSION / ASSESSMENT AND PLAN / ED COURSE  Suspect he had a seroma that is now opened up and no evidence of infection.  Will discuss with vascular surgery if they want me to pack area or close to or anything else.  No evidence of infection to indicate antibiotics.  Patient will likely be discharged home.  Discussed with Dr. Donzetta Matters who recommends packing and they will follow-up on Friday to try to get home health or the patient in the office for further packing changes and wound exploration.     Pertinent labs & imaging results that were available during my care of the patient were reviewed by me and considered in my medical decision making (see chart for details).  ____________________________________________  FINAL CLINICAL IMPRESSION(S) / ED DIAGNOSES  Final diagnoses:  Postoperative surgical complication involving skin associated with non-dermatologic procedure, unspecified  complication  Wound dehiscence     MEDICATIONS GIVEN DURING THIS VISIT:  Medications  sodium chloride flush (NS) 0.9 % injection 3 mL (3 mLs Intravenous Not Given 06/08/18 0329)     NEW OUTPATIENT MEDICATIONS STARTED DURING THIS VISIT:  Discharge Medication List as of 06/08/2018  5:59 AM      Note:  This note was prepared with assistance of Dragon voice recognition software. Occasional wrong-word or sound-a-like substitutions may have occurred due to the inherent limitations of voice recognition software.   Janai Maudlin, Corene Cornea, MD 06/08/18 [id number]

## 2018-06-11 ENCOUNTER — Inpatient Hospital Stay (HOSPITAL_COMMUNITY)
Admission: EM | Admit: 2018-06-11 | Discharge: 2018-06-20 | DRG: 252 | Disposition: A | Discharge status: Home Health | Payer: Medicare Other | Attending: Family Medicine | Admitting: Family Medicine

## 2018-06-11 ENCOUNTER — Encounter (HOSPITAL_COMMUNITY): Payer: Self-pay | Admitting: Emergency Medicine

## 2018-06-11 ENCOUNTER — Encounter: Payer: Medicare Other | Admitting: Family

## 2018-06-11 ENCOUNTER — Ambulatory Visit (INDEPENDENT_AMBULATORY_CARE_PROVIDER_SITE_OTHER): Payer: Self-pay | Admitting: Family

## 2018-06-11 ENCOUNTER — Ambulatory Visit (INDEPENDENT_AMBULATORY_CARE_PROVIDER_SITE_OTHER)
Admission: RE | Admit: 2018-06-11 | Discharge: 2018-06-11 | Disposition: A | Discharge status: Home / Self Care | Payer: Medicare Other | Source: Ambulatory Visit | Attending: Family | Admitting: Family

## 2018-06-11 ENCOUNTER — Encounter: Payer: Self-pay | Admitting: Family

## 2018-06-11 ENCOUNTER — Other Ambulatory Visit (HOSPITAL_COMMUNITY): Payer: Self-pay | Admitting: Family

## 2018-06-11 ENCOUNTER — Other Ambulatory Visit: Payer: Self-pay

## 2018-06-11 VITALS — BP 145/77 | HR 82 | Temp 99.4°F | Resp 18 | Ht 72.0 in | Wt 178.0 lb

## 2018-06-11 DIAGNOSIS — Z951 Presence of aortocoronary bypass graft: Secondary | ICD-10-CM

## 2018-06-11 DIAGNOSIS — T148XXA Other injury of unspecified body region, initial encounter: Secondary | ICD-10-CM

## 2018-06-11 DIAGNOSIS — Z794 Long term (current) use of insulin: Secondary | ICD-10-CM

## 2018-06-11 DIAGNOSIS — E1165 Type 2 diabetes mellitus with hyperglycemia: Secondary | ICD-10-CM | POA: Diagnosis present

## 2018-06-11 DIAGNOSIS — IMO0002 Reserved for concepts with insufficient information to code with codable children: Secondary | ICD-10-CM

## 2018-06-11 DIAGNOSIS — I482 Chronic atrial fibrillation, unspecified: Secondary | ICD-10-CM | POA: Diagnosis not present

## 2018-06-11 DIAGNOSIS — E7800 Pure hypercholesterolemia, unspecified: Secondary | ICD-10-CM | POA: Diagnosis present

## 2018-06-11 DIAGNOSIS — K219 Gastro-esophageal reflux disease without esophagitis: Secondary | ICD-10-CM | POA: Diagnosis present

## 2018-06-11 DIAGNOSIS — Z7951 Long term (current) use of inhaled steroids: Secondary | ICD-10-CM

## 2018-06-11 DIAGNOSIS — R229 Localized swelling, mass and lump, unspecified: Principal | ICD-10-CM

## 2018-06-11 DIAGNOSIS — L97429 Non-pressure chronic ulcer of left heel and midfoot with unspecified severity: Secondary | ICD-10-CM | POA: Diagnosis present

## 2018-06-11 DIAGNOSIS — G4730 Sleep apnea, unspecified: Secondary | ICD-10-CM | POA: Diagnosis present

## 2018-06-11 DIAGNOSIS — I255 Ischemic cardiomyopathy: Secondary | ICD-10-CM | POA: Diagnosis present

## 2018-06-11 DIAGNOSIS — Z95810 Presence of automatic (implantable) cardiac defibrillator: Secondary | ICD-10-CM | POA: Diagnosis present

## 2018-06-11 DIAGNOSIS — Z9981 Dependence on supplemental oxygen: Secondary | ICD-10-CM

## 2018-06-11 DIAGNOSIS — Z888 Allergy status to other drugs, medicaments and biological substances status: Secondary | ICD-10-CM

## 2018-06-11 DIAGNOSIS — L089 Local infection of the skin and subcutaneous tissue, unspecified: Secondary | ICD-10-CM | POA: Diagnosis present

## 2018-06-11 DIAGNOSIS — Z79891 Long term (current) use of opiate analgesic: Secondary | ICD-10-CM

## 2018-06-11 DIAGNOSIS — I739 Peripheral vascular disease, unspecified: Secondary | ICD-10-CM | POA: Diagnosis present

## 2018-06-11 DIAGNOSIS — Z8719 Personal history of other diseases of the digestive system: Secondary | ICD-10-CM

## 2018-06-11 DIAGNOSIS — A0472 Enterocolitis due to Clostridium difficile, not specified as recurrent: Secondary | ICD-10-CM | POA: Diagnosis present

## 2018-06-11 DIAGNOSIS — D62 Acute posthemorrhagic anemia: Secondary | ICD-10-CM | POA: Diagnosis not present

## 2018-06-11 DIAGNOSIS — E785 Hyperlipidemia, unspecified: Secondary | ICD-10-CM | POA: Diagnosis present

## 2018-06-11 DIAGNOSIS — I472 Ventricular tachycardia: Secondary | ICD-10-CM | POA: Diagnosis not present

## 2018-06-11 DIAGNOSIS — Z7982 Long term (current) use of aspirin: Secondary | ICD-10-CM

## 2018-06-11 DIAGNOSIS — I252 Old myocardial infarction: Secondary | ICD-10-CM

## 2018-06-11 DIAGNOSIS — I10 Essential (primary) hypertension: Secondary | ICD-10-CM | POA: Diagnosis present

## 2018-06-11 DIAGNOSIS — J449 Chronic obstructive pulmonary disease, unspecified: Secondary | ICD-10-CM | POA: Diagnosis present

## 2018-06-11 DIAGNOSIS — F17210 Nicotine dependence, cigarettes, uncomplicated: Secondary | ICD-10-CM | POA: Diagnosis present

## 2018-06-11 DIAGNOSIS — L03314 Cellulitis of groin: Secondary | ICD-10-CM | POA: Diagnosis present

## 2018-06-11 DIAGNOSIS — T827XXA Infection and inflammatory reaction due to other cardiac and vascular devices, implants and grafts, initial encounter: Secondary | ICD-10-CM | POA: Diagnosis not present

## 2018-06-11 DIAGNOSIS — R1032 Left lower quadrant pain: Secondary | ICD-10-CM | POA: Diagnosis present

## 2018-06-11 DIAGNOSIS — Z720 Tobacco use: Secondary | ICD-10-CM

## 2018-06-11 DIAGNOSIS — Z79899 Other long term (current) drug therapy: Secondary | ICD-10-CM

## 2018-06-11 DIAGNOSIS — I2510 Atherosclerotic heart disease of native coronary artery without angina pectoris: Secondary | ICD-10-CM | POA: Diagnosis present

## 2018-06-11 DIAGNOSIS — S71102A Unspecified open wound, left thigh, initial encounter: Secondary | ICD-10-CM | POA: Diagnosis present

## 2018-06-11 DIAGNOSIS — Z955 Presence of coronary angioplasty implant and graft: Secondary | ICD-10-CM

## 2018-06-11 DIAGNOSIS — E1122 Type 2 diabetes mellitus with diabetic chronic kidney disease: Secondary | ICD-10-CM | POA: Diagnosis present

## 2018-06-11 DIAGNOSIS — Z992 Dependence on renal dialysis: Secondary | ICD-10-CM

## 2018-06-11 DIAGNOSIS — I5022 Chronic systolic (congestive) heart failure: Secondary | ICD-10-CM | POA: Diagnosis present

## 2018-06-11 DIAGNOSIS — E1151 Type 2 diabetes mellitus with diabetic peripheral angiopathy without gangrene: Secondary | ICD-10-CM | POA: Diagnosis present

## 2018-06-11 DIAGNOSIS — G4733 Obstructive sleep apnea (adult) (pediatric): Secondary | ICD-10-CM | POA: Diagnosis present

## 2018-06-11 DIAGNOSIS — D696 Thrombocytopenia, unspecified: Secondary | ICD-10-CM | POA: Diagnosis present

## 2018-06-11 DIAGNOSIS — E11621 Type 2 diabetes mellitus with foot ulcer: Secondary | ICD-10-CM | POA: Diagnosis present

## 2018-06-11 DIAGNOSIS — I779 Disorder of arteries and arterioles, unspecified: Secondary | ICD-10-CM

## 2018-06-11 DIAGNOSIS — Z885 Allergy status to narcotic agent status: Secondary | ICD-10-CM

## 2018-06-11 DIAGNOSIS — N2581 Secondary hyperparathyroidism of renal origin: Secondary | ICD-10-CM | POA: Diagnosis present

## 2018-06-11 DIAGNOSIS — Y832 Surgical operation with anastomosis, bypass or graft as the cause of abnormal reaction of the patient, or of later complication, without mention of misadventure at the time of the procedure: Secondary | ICD-10-CM | POA: Diagnosis present

## 2018-06-11 DIAGNOSIS — E1142 Type 2 diabetes mellitus with diabetic polyneuropathy: Secondary | ICD-10-CM | POA: Diagnosis present

## 2018-06-11 DIAGNOSIS — Z8619 Personal history of other infectious and parasitic diseases: Secondary | ICD-10-CM

## 2018-06-11 DIAGNOSIS — E8889 Other specified metabolic disorders: Secondary | ICD-10-CM | POA: Diagnosis present

## 2018-06-11 DIAGNOSIS — Z9181 History of falling: Secondary | ICD-10-CM

## 2018-06-11 DIAGNOSIS — I132 Hypertensive heart and chronic kidney disease with heart failure and with stage 5 chronic kidney disease, or end stage renal disease: Secondary | ICD-10-CM | POA: Diagnosis present

## 2018-06-11 DIAGNOSIS — N186 End stage renal disease: Secondary | ICD-10-CM | POA: Diagnosis present

## 2018-06-11 DIAGNOSIS — T8131XA Disruption of external operation (surgical) wound, not elsewhere classified, initial encounter: Secondary | ICD-10-CM | POA: Diagnosis present

## 2018-06-11 LAB — CBC WITH DIFFERENTIAL/PLATELET
Abs Immature Granulocytes: 0.03 10*3/uL (ref 0.00–0.07)
Basophils Absolute: 0 10*3/uL (ref 0.0–0.1)
Basophils Relative: 0 %
Eosinophils Absolute: 0.7 10*3/uL — ABNORMAL HIGH (ref 0.0–0.5)
Eosinophils Relative: 8 %
HCT: 40.5 % (ref 39.0–52.0)
Hemoglobin: 12 g/dL — ABNORMAL LOW (ref 13.0–17.0)
Immature Granulocytes: 0 %
Lymphocytes Relative: 11 %
Lymphs Abs: 0.9 10*3/uL (ref 0.7–4.0)
MCH: 24.8 pg — ABNORMAL LOW (ref 26.0–34.0)
MCHC: 29.6 g/dL — AB (ref 30.0–36.0)
MCV: 83.9 fL (ref 80.0–100.0)
Monocytes Absolute: 0.6 10*3/uL (ref 0.1–1.0)
Monocytes Relative: 7 %
Neutro Abs: 6.1 10*3/uL (ref 1.7–7.7)
Neutrophils Relative %: 74 %
Platelets: 104 10*3/uL — ABNORMAL LOW (ref 150–400)
RBC: 4.83 MIL/uL (ref 4.22–5.81)
RDW: 17.5 % — AB (ref 11.5–15.5)
WBC: 8.3 10*3/uL (ref 4.0–10.5)
nRBC: 0 % (ref 0.0–0.2)

## 2018-06-11 LAB — COMPREHENSIVE METABOLIC PANEL
ALT: 17 U/L (ref 0–44)
AST: 30 U/L (ref 15–41)
Albumin: 2.8 g/dL — ABNORMAL LOW (ref 3.5–5.0)
Alkaline Phosphatase: 155 U/L — ABNORMAL HIGH (ref 38–126)
Anion gap: 14 (ref 5–15)
BUN: 27 mg/dL — ABNORMAL HIGH (ref 6–20)
CO2: 28 mmol/L (ref 22–32)
Calcium: 9.4 mg/dL (ref 8.9–10.3)
Chloride: 91 mmol/L — ABNORMAL LOW (ref 98–111)
Creatinine, Ser: 4.22 mg/dL — ABNORMAL HIGH (ref 0.61–1.24)
GFR calc Af Amer: 17 mL/min — ABNORMAL LOW (ref >60)
GFR calc non Af Amer: 15 mL/min — ABNORMAL LOW (ref >60)
Glucose, Bld: 124 mg/dL — ABNORMAL HIGH (ref 70–99)
Potassium: 4.2 mmol/L (ref 3.5–5.1)
Sodium: 133 mmol/L — ABNORMAL LOW (ref 135–145)
Total Bilirubin: 1.8 mg/dL — ABNORMAL HIGH (ref 0.3–1.2)
Total Protein: 6.9 g/dL (ref 6.5–8.1)

## 2018-06-11 LAB — SEDIMENTATION RATE: Sed Rate: 30 mm/hr — ABNORMAL HIGH (ref 0–16)

## 2018-06-11 LAB — PROCALCITONIN: Procalcitonin: 0.65 ng/mL

## 2018-06-11 LAB — LACTIC ACID, PLASMA: Lactic Acid, Venous: 1.4 mmol/L (ref 0.5–1.9)

## 2018-06-11 LAB — GLUCOSE, CAPILLARY: Glucose-Capillary: 141 mg/dL — ABNORMAL HIGH (ref 70–99)

## 2018-06-11 LAB — C-REACTIVE PROTEIN: CRP: 18.8 mg/dL — ABNORMAL HIGH (ref <1.0)

## 2018-06-11 MED ORDER — INSULIN ASPART 100 UNIT/ML ~~LOC~~ SOLN: 0.0000 [IU] | Freq: Every day | SUBCUTANEOUS | Status: DC

## 2018-06-11 MED ORDER — NICOTINE 21 MG/24HR TD PT24
21.0000 mg | MEDICATED_PATCH | Freq: Every day | TRANSDERMAL | Status: DC
  Administered 2018-06-11 – 2018-06-20 (×9): 21 mg via TRANSDERMAL
  Filled 2018-06-11 (×9): qty 1

## 2018-06-11 MED ORDER — VANCOMYCIN HCL 10 G IV SOLR
1500.0000 mg | Freq: Once | INTRAVENOUS | Status: DC
  Filled 2018-06-11: qty 1500

## 2018-06-11 MED ORDER — ACETAMINOPHEN 650 MG RE SUPP: 650.0000 mg | Freq: Four times a day (QID) | RECTAL | Status: DC | PRN

## 2018-06-11 MED ORDER — FLUOXETINE HCL 20 MG PO CAPS
20.0000 mg | ORAL_CAPSULE | Freq: Every day | ORAL | Status: DC
  Administered 2018-06-11 – 2018-06-20 (×9): 20 mg via ORAL
  Filled 2018-06-11 (×9): qty 1

## 2018-06-11 MED ORDER — OXYCODONE-ACETAMINOPHEN 7.5-325 MG PO TABS: 1.0000 | ORAL_TABLET | Freq: Four times a day (QID) | ORAL | Status: DC | PRN

## 2018-06-11 MED ORDER — ONDANSETRON HCL 4 MG/2ML IJ SOLN: 4.0000 mg | Freq: Four times a day (QID) | INTRAMUSCULAR | Status: DC | PRN

## 2018-06-11 MED ORDER — FERRIC CITRATE 1 GM 210 MG(FE) PO TABS
420.0000 mg | ORAL_TABLET | Freq: Three times a day (TID) | ORAL | Status: DC
  Administered 2018-06-12 – 2018-06-20 (×19): 420 mg via ORAL
  Filled 2018-06-11 (×25): qty 2

## 2018-06-11 MED ORDER — LIDOCAINE-PRILOCAINE 2.5-2.5 % EX CREA
1.0000 "application " | TOPICAL_CREAM | CUTANEOUS | Status: DC
  Filled 2018-06-11: qty 5

## 2018-06-11 MED ORDER — SENNOSIDES-DOCUSATE SODIUM 8.6-50 MG PO TABS: 1.0000 | ORAL_TABLET | Freq: Every evening | ORAL | Status: DC | PRN

## 2018-06-11 MED ORDER — HYDRALAZINE HCL 20 MG/ML IJ SOLN: 5.0000 mg | INTRAMUSCULAR | Status: DC | PRN

## 2018-06-11 MED ORDER — RENA-VITE PO TABS
1.0000 | ORAL_TABLET | Freq: Every day | ORAL | Status: DC
  Administered 2018-06-11 – 2018-06-19 (×9): 1 via ORAL
  Filled 2018-06-11 (×9): qty 1

## 2018-06-11 MED ORDER — CALCITRIOL 0.25 MCG PO CAPS: 0.2500 ug | ORAL_CAPSULE | ORAL | Status: DC

## 2018-06-11 MED ORDER — COLLAGENASE 250 UNIT/GM EX OINT
1.0000 "application " | TOPICAL_OINTMENT | Freq: Every day | CUTANEOUS | Status: DC
  Administered 2018-06-11 – 2018-06-18 (×7): 1 via TOPICAL
  Filled 2018-06-11: qty 30

## 2018-06-11 MED ORDER — METOPROLOL TARTRATE 12.5 MG HALF TABLET
12.5000 mg | ORAL_TABLET | Freq: Two times a day (BID) | ORAL | Status: DC
  Administered 2018-06-11 – 2018-06-20 (×17): 12.5 mg via ORAL
  Filled 2018-06-11 (×17): qty 1

## 2018-06-11 MED ORDER — PANTOPRAZOLE SODIUM 40 MG PO TBEC
40.0000 mg | DELAYED_RELEASE_TABLET | Freq: Every day | ORAL | Status: DC
  Administered 2018-06-12 – 2018-06-20 (×8): 40 mg via ORAL
  Filled 2018-06-11 (×8): qty 1

## 2018-06-11 MED ORDER — LEVALBUTEROL HCL 0.63 MG/3ML IN NEBU
0.6300 mg | INHALATION_SOLUTION | Freq: Two times a day (BID) | RESPIRATORY_TRACT | Status: DC
  Administered 2018-06-11 – 2018-06-16 (×9): 0.63 mg via RESPIRATORY_TRACT
  Filled 2018-06-11 (×10): qty 3

## 2018-06-11 MED ORDER — OXYCODONE HCL 5 MG PO TABS
2.5000 mg | ORAL_TABLET | Freq: Four times a day (QID) | ORAL | Status: DC | PRN
  Administered 2018-06-11 – 2018-06-16 (×15): 2.5 mg via ORAL
  Filled 2018-06-11 (×15): qty 1

## 2018-06-11 MED ORDER — ACETAMINOPHEN 325 MG PO TABS
650.0000 mg | ORAL_TABLET | Freq: Four times a day (QID) | ORAL | Status: DC | PRN
  Administered 2018-06-14 – 2018-06-15 (×2): 650 mg via ORAL
  Filled 2018-06-11 (×2): qty 2

## 2018-06-11 MED ORDER — VANCOMYCIN VARIABLE DOSE PER UNSTABLE RENAL FUNCTION (PHARMACIST DOSING): Status: DC

## 2018-06-11 MED ORDER — TIOTROPIUM BROMIDE MONOHYDRATE 18 MCG IN CAPS: 18.0000 ug | ORAL_CAPSULE | Freq: Every day | RESPIRATORY_TRACT | Status: DC

## 2018-06-11 MED ORDER — ASPIRIN EC 81 MG PO TBEC
81.0000 mg | DELAYED_RELEASE_TABLET | Freq: Every day | ORAL | Status: DC
  Administered 2018-06-11 – 2018-06-20 (×9): 81 mg via ORAL
  Filled 2018-06-11 (×9): qty 1

## 2018-06-11 MED ORDER — AMIODARONE HCL 200 MG PO TABS
200.0000 mg | ORAL_TABLET | Freq: Two times a day (BID) | ORAL | Status: DC
  Administered 2018-06-11 – 2018-06-20 (×18): 200 mg via ORAL
  Filled 2018-06-11 (×18): qty 1

## 2018-06-11 MED ORDER — VANCOMYCIN HCL 10 G IV SOLR
1500.0000 mg | Freq: Once | INTRAVENOUS | Status: AC
  Administered 2018-06-11: 1500 mg via INTRAVENOUS
  Filled 2018-06-11: qty 1500

## 2018-06-11 MED ORDER — GABAPENTIN 300 MG PO CAPS
300.0000 mg | ORAL_CAPSULE | Freq: Every day | ORAL | Status: DC
  Administered 2018-06-11 – 2018-06-19 (×9): 300 mg via ORAL
  Filled 2018-06-11 (×9): qty 1

## 2018-06-11 MED ORDER — SACCHAROMYCES BOULARDII 250 MG PO CAPS
250.0000 mg | ORAL_CAPSULE | Freq: Two times a day (BID) | ORAL | Status: DC
  Administered 2018-06-11 – 2018-06-12 (×2): 250 mg via ORAL
  Filled 2018-06-11 (×2): qty 1

## 2018-06-11 MED ORDER — UMECLIDINIUM BROMIDE 62.5 MCG/INH IN AEPB
1.0000 | INHALATION_SPRAY | Freq: Every day | RESPIRATORY_TRACT | Status: DC
  Administered 2018-06-12 – 2018-06-19 (×6): 1 via RESPIRATORY_TRACT
  Filled 2018-06-11: qty 7

## 2018-06-11 MED ORDER — IPRATROPIUM BROMIDE 0.02 % IN SOLN
0.5000 mg | Freq: Four times a day (QID) | RESPIRATORY_TRACT | Status: DC | PRN
  Administered 2018-06-15 – 2018-06-16 (×2): 0.5 mg via RESPIRATORY_TRACT
  Filled 2018-06-11 (×2): qty 2.5

## 2018-06-11 MED ORDER — OXYCODONE-ACETAMINOPHEN 5-325 MG PO TABS
1.0000 | ORAL_TABLET | Freq: Four times a day (QID) | ORAL | Status: DC | PRN
  Administered 2018-06-11 – 2018-06-16 (×14): 1 via ORAL
  Filled 2018-06-11 (×14): qty 1

## 2018-06-11 MED ORDER — ATORVASTATIN CALCIUM 40 MG PO TABS
40.0000 mg | ORAL_TABLET | Freq: Two times a day (BID) | ORAL | Status: DC
  Administered 2018-06-11 – 2018-06-20 (×17): 40 mg via ORAL
  Filled 2018-06-11 (×17): qty 1

## 2018-06-11 MED ORDER — FUROSEMIDE 40 MG PO TABS
40.0000 mg | ORAL_TABLET | ORAL | Status: DC
  Administered 2018-06-12 – 2018-06-19 (×4): 40 mg via ORAL
  Filled 2018-06-11: qty 1
  Filled 2018-06-11: qty 2
  Filled 2018-06-11 (×2): qty 1

## 2018-06-11 MED ORDER — NITROGLYCERIN 0.4 MG SL SUBL: 0.4000 mg | SUBLINGUAL_TABLET | SUBLINGUAL | Status: DC | PRN

## 2018-06-11 MED ORDER — ONDANSETRON HCL 4 MG PO TABS: 4.0000 mg | ORAL_TABLET | Freq: Four times a day (QID) | ORAL | Status: DC | PRN

## 2018-06-11 MED ORDER — INSULIN ASPART 100 UNIT/ML ~~LOC~~ SOLN
0.0000 [IU] | Freq: Three times a day (TID) | SUBCUTANEOUS | Status: DC
  Administered 2018-06-12 – 2018-06-16 (×6): 1 [IU] via SUBCUTANEOUS

## 2018-06-11 NOTE — Progress Notes (Signed)
Pharmacy Antibiotic Note  Jose Sellers is a 58 y.o. male admitted on 06/11/2018 with cellulitis.  Pharmacy has been consulted for Vancomycin dosing.  Plan: Vancomycin 1500 mg IV x 1, then dosing based on dialysis schedule. Will monitor renal function, C&S, and vanc levels as needed  Height: 6' (182.9 cm) Weight: 178 lb 9.2 oz (81 kg) IBW/kg (Calculated) : 77.6  Temp (24hrs), Avg:98.8 F (37.1 C), Min:98.1 F (36.7 C), Max:99.4 F (37.4 C)  Recent Labs  Lab 06/08/18 0023 06/11/18 1633  WBC 5.3 8.3  CREATININE 5.46* 4.22*  LATICACIDVEN 1.3 1.4    Estimated Creatinine Clearance: 21.5 mL/min (A) (by C-G formula based on SCr of 4.22 mg/dL (H)).    Allergies  Allergen Reactions  . Albuterol Palpitations    SVT  . Prednisone Shortness Of Breath    Wife stated that patient develops breathing issues.  . Fluticasone Furoate-Vilanterol Palpitations  . Morphine Rash    Antimicrobials this admission: Vanc 3/16 >>   Thank you for allowing pharmacy to be a part of this patient's care.  Alanda Slim, PharmD, [facility] Clinical Pharmacist Please see AMION for all Pharmacists' Contact Phone Numbers 06/11/2018, 8:21 PM

## 2018-06-11 NOTE — H&P (Signed)
History and Physical    JOLLY BLEICHER VOP:[id number] DOB: 1961-02-10 DOA: 06/11/2018  Referring MD/NP/PA:   PCP: Raina Mina., MD   Patient coming from:  The patient is coming from home.  At baseline, pt is independent for most of ADL.        Chief Complaint: left groin erythema and open wound in left medial thigh  HPI: Jose Sellers is a 58 y.o. male with medical history significant of A. Fib not on AC, s/p AICD, asthma, CAD, sCHF with EF 25%, COPD, DM, ESRD-HD (MWF), PAD hyperlipidemia, chronic thrombocytopenia, who presents with left groin erythema and swelling, open wound in left medial thigh.  Patient recent underwent left femoral endarterectomy with patch angioplasty and left femoral to above-the-knee popliteal bypass with PTFE graft by Dr. Donnetta Hutching on 05/04/2018. After the procedure, he states that his wound in the left medial thigh has not healed completely. He presented to the ED on 06/08/2018 with complaints of wound dehiscence to the wound on the left medial thigh.  At that time vascular surgery was consulted recommended packing the wound and having patient follow-up the following week for reevaluation and plan to set up home health. Pt states that he has worsening pain in the left medial thigh, which is constant, 10 out of 10 in severity, sharp, nonradiating, with some bloody drainage from the wound, but  no purulent drainage per patient. He states that his wound in the left medial thigh is very deep.  Denies fever or chills. Patient was seen at vein and vascular surgery NP today. During evaluation, wound packing was removed.  Wounds were felt to appear infected and he was advised to come to the ED for admission. ID consult was recommended and vascular surgery agreed to see patient in the ED. Pt states that he also has erythema and swelling in left groin area, but no significant tenderness.  Patient states that the erythema in the left groin area is new. Per EDP, pt had [place] in VVS office,  which showed "multilobular fluid collection in the left groin at site of lump with the largest measurement of 3.38 x 3.64 with no color flow visualized. No evidence of DVT in the CFV".  Patient has mild cough with little white mucus production, but no chest pain or shortness of breath.  Denies nausea, vomiting, diarrhea or abdominal pain no symptoms of UTI or unilateral weakness. Pt also has chronic ulcer in left heel and left great toe.  ED Course: pt was found to have WBC 8.3, potassium 4.2, bicarbonate of 28, creatinine 4.  2 2, BUN 27, temperature 99.4, no tachycardia, no tachypnea, oxygen saturation 94% on room air.  Patient is placed on telemetry bed for observation.   Review of Systems:   General: no fevers, chills, no body weight gain, has fatigue HEENT: no blurry vision, hearing changes or sore throat Respiratory: no dyspnea, has coughing, no wheezing CV: no chest pain, no palpitations GI: no nausea, vomiting, abdominal pain, diarrhea, constipation GU: no dysuria, burning on urination, increased urinary frequency, hematuria  Ext: no leg edema Neuro: no unilateral weakness, numbness, or tingling, no vision change or hearing loss Skin: has open wound in the left medial thigh, erythema and swelling in the left groin area.  Patient also has chronic ulcer in the left heel and left great toe MSK: No muscle spasm, no deformity, no limitation of range of movement in spin Heme: No easy bruising.  Travel history: No recent long distant travel.  Allergy:  Allergies  Allergen Reactions   Albuterol Palpitations    SVT   Prednisone Shortness Of Breath    Wife stated that patient develops breathing issues.   Fluticasone Furoate-Vilanterol Palpitations   Morphine Rash    Past Medical History:  Diagnosis Date   A-fib ([place])    AICD (automatic cardioverter/defibrillator) present    Anal fissure    Anxiety    Asthma    CAD (coronary artery disease)    CHF (congestive heart  failure) (HCC)    COPD (chronic obstructive pulmonary disease) (HCC)    Degeneration of lumbar or lumbosacral intervertebral disc    DM (diabetes mellitus), type 2, uncontrolled w/neurologic complication (HCC)    Dyspnea    with exertion   Dysthymic disorder    End stage renal disease on dialysis [facility])    dialysis M-W-F   [employer] in [place] (primary) hypertension    GERD (gastroesophageal reflux disease)    History of hiatal hernia    Hx of colonic polyps    Hyperlipemia    Myocardial infarction ([place])    Peripheral vascular disease (HCC)    Pneumonia    Recurrent major depression (HCC)    Sleep apnea    Ventral hernia without obstruction or gangrene    Wears dentures     Past Surgical History:  Procedure Laterality Date   ABDOMINAL AORTOGRAM W/LOWER EXTREMITY N/A 04/20/2018   Procedure: ABDOMINAL AORTOGRAM W/LOWER EXTREMITY;  Surgeon: Elam Dutch, MD;  Location: [facility];  Service: Cardiovascular;  Laterality: N/A;   AV FISTULA PLACEMENT Right 02/13/2018   Procedure: CREATION OF RADIOCEPHALIC ARTERIOVENOUS FISTULA RIGHT ARM;  Surgeon: Elam Dutch, MD;  Location: [facility] OR;  Service: Vascular;  Laterality: Right;   CAD s/p cabbage     CARDIAC DEFIBRILLATOR PLACEMENT  2012   CATARACT EXTRACTION W/ INTRAOCULAR LENS IMPLANT     COLONOSCOPY  12/27/2013   Colonic polyps status post polypectomy.  Mild sigmoid diverticulosis. Internal hemorrhoids. Tubular adenoma.    CORONARY ANGIOPLASTY     CORONARY STENT PLACEMENT     ENDARTERECTOMY Left 05/04/2018   Procedure: LEFT FEMORAL ENDARTERECTOMY;  Surgeon: [name], MD;  Location: [facility] OR;  Service: Vascular;  Laterality: Left;   ESOPHAGOGASTRODUODENOSCOPY  12/01/2016   Mild gastritis.    FEMORAL-POPLITEAL BYPASS GRAFT Left 05/04/2018   Procedure: LEFT FEMORAL TO ABOVE KNEE POPLITEAL BYPASS GRAFT USING 6MM X 80CM GORE PROPATEN RINGED GRAFT;  Surgeon: [name], MD;   Location: [facility] OR;  Service: Vascular;  Laterality: Left;   heart bypass     HERNIA REPAIR     KNEE SURGERY     PATCH ANGIOPLASTY Left 05/04/2018   Procedure: Left Common Femoral/Profunda Patch Angioplasty using Hemashield Platinum Finesse Patch;  Surgeon: [name], MD;  Location: [facility] OR;  Service: Vascular;  Laterality: Left;    Social History:  reports that he has been smoking cigarettes. He has a 8.75 pack-year smoking history. He has never used smokeless tobacco. He reports previous alcohol use. He reports that he does not use drugs.  Family History:  Family History  Problem Relation Age of Onset   Colon cancer Neg Hx      Prior to Admission medications   Medication Sig Start Date End Date Taking? Authorizing Provider  amiodarone (PACERONE) 200 MG tablet Take 200 mg by mouth 2 (two) times daily.     [provider]  aspirin 81 MG tablet Take  81 mg by mouth daily.  01/20/11   [provider]  atorvastatin (LIPITOR) 40 MG tablet Take 40 mg by mouth 2 (two) times daily.     [provider]  B Complex-C-Folic Acid (DIALYVITE 854) 0.8 MG TABS Take 1 tablet by mouth daily.  12/19/17   [provider]  calcitRIOL (ROCALTROL) 0.25 MCG capsule Take 0.25 mcg by mouth every Monday, Wednesday, and Friday with hemodialysis.     [provider]  esomeprazole (NEXIUM) 40 MG capsule Take 40 mg by mouth daily before breakfast.     [provider]  ferric citrate (AURYXIA) 1 GM 210 MG(Fe) tablet Take 420 mg by mouth 3 (three) times daily with meals.     [provider]  FLUoxetine (PROZAC) 20 MG capsule Take 20 mg by mouth daily.  11/07/17   [provider]  furosemide (LASIX) 40 MG tablet Take 40 mg by mouth See admin instructions. Non dialysis days. Do not take on Monday, Wednesday and Friday    [provider]  gabapentin (NEURONTIN) 300 MG capsule Take 1 capsule (300 mg total) by mouth 3 (three) times  daily. Patient taking differently: Take 300 mg by mouth at bedtime.  03/14/18   Landis Martins, DPM  Insulin Glargine (LANTUS SOLOSTAR) 100 UNIT/ML Solostar Pen Inject 8-12 Units into the skin at bedtime as needed (blood sugar).     [provider]  insulin lispro (HUMALOG KWIKPEN) 100 UNIT/ML KwikPen Inject 0-32 Units into the skin 3 (three) times daily. Sliding Scale Insulin     [provider]  levalbuterol (XOPENEX) 0.63 MG/3ML nebulizer solution Inhale 0.63 mg into the lungs 2 (two) times daily.  10/09/17   [provider]  lidocaine-prilocaine (EMLA) cream Apply 1 application topically See admin instructions. Apply small amount to access site 1-2 hours prior to dialysis. 01/29/18   [provider]  metoprolol tartrate (LOPRESSOR) 25 MG tablet Take 12.5 mg by mouth 2 (two) times daily.  03/10/18   [provider]  nitroGLYCERIN (NITROSTAT) 0.4 MG SL tablet Place 0.4 mg under the tongue every 5 (five) minutes as needed for chest pain.    [provider]  oxycodone (ROXICODONE) 30 MG immediate release tablet Take 30 mg by mouth every 8 (eight) hours as needed for pain.     [provider]  oxyCODONE-acetaminophen (PERCOCET) 7.5-325 MG tablet Take 1 tablet by mouth every 6 (six) hours as needed for severe pain. 05/06/18   Dagoberto Ligas, PA-C  SANTYL ointment Apply 1 application topically daily. 05/29/18   [name], MD  tiotropium (SPIRIVA) 18 MCG inhalation capsule Place 18 mcg into inhaler and inhale daily.  03/10/18   [provider]    Physical Exam: Vitals:   06/11/18 1930 06/11/18 2000 06/11/18 2043 06/11/18 2149  BP: 133/75 (!) 142/75 (!) 144/76   Pulse: 80 80 82   Resp:   18   Temp:   98.3 F (36.8 C)   TempSrc:   Oral   SpO2: 97% 94% 95% 96%  Weight:      Height:       General: Not in acute distress HEENT:       Eyes: PERRL, EOMI, no scleral icterus.       ENT: No discharge from the ears and nose, no  pharynx injection, no tonsillar enlargement.        Neck: No JVD, no bruit, no mass felt. Heme: No neck lymph node enlargement. Cardiac: S1/S2, RRR, No  murmurs, No gallops or rubs. Respiratory: No rales, wheezing, rhonchi or rubs. GI: Soft, nondistended, nontender, no rebound pain, no organomegaly, BS present. GU: No hematuria Ext: No pitting leg edema bilaterally. 1+DP/PT pulse bilaterally. Musculoskeletal: No joint deformities, No joint redness or warmth, no limitation of ROM in spin. Skin: has deep dehiscent open wound in the left medial thigh with little purulent and bloody drainage. Has erythema and swelling in the left groin area.  Patient also has chronic ulcer in the left heel and left great toe  Neuro: Alert, oriented X3, cranial nerves II-XII grossly intact, moves all extremities normally.  Psych: Patient is not psychotic, no suicidal or hemocidal ideation.  Labs on Admission: I have personally reviewed following labs and imaging studies  CBC: Recent Labs  Lab 06/08/18 0023 06/11/18 1633  WBC 5.3 8.3  NEUTROABS 3.8 6.1  HGB 11.1* 12.0*  HCT 36.3* 40.5  MCV 84.8 83.9  PLT 120* 720*   Basic Metabolic Panel: Recent Labs  Lab 06/08/18 0023 06/11/18 1633  NA 135 133*  K 4.3 4.2  CL 96* 91*  CO2 28 28  GLUCOSE 156* 124*  BUN 40* 27*  CREATININE 5.46* 4.22*  CALCIUM 9.6 9.4   GFR: Estimated Creatinine Clearance: 21.5 mL/min (A) (by C-G formula based on SCr of 4.22 mg/dL (H)). Liver Function Tests: Recent Labs  Lab 06/08/18 0023 06/11/18 1633  AST 21 30  ALT 18 17  ALKPHOS 125 155*  BILITOT 1.4* 1.8*  PROT 6.6 6.9  ALBUMIN 2.9* 2.8*   No results for input(s): LIPASE, AMYLASE in the last 168 hours. No results for input(s): AMMONIA in the last 168 hours. Coagulation Profile: No results for input(s): INR, PROTIME in the last 168 hours. Cardiac Enzymes: No results for input(s): CKTOTAL, CKMB, CKMBINDEX, TROPONINI in the last 168 hours. BNP (last 3  results) No results for input(s): PROBNP in the last 8760 hours. HbA1C: No results for input(s): HGBA1C in the last 72 hours. CBG: Recent Labs  Lab 06/11/18 2043  GLUCAP 141*   Lipid Profile: No results for input(s): CHOL, HDL, LDLCALC, TRIG, CHOLHDL, LDLDIRECT in the last 72 hours. Thyroid Function Tests: No results for input(s): TSH, T4TOTAL, FREET4, T3FREE, THYROIDAB in the last 72 hours. Anemia Panel: No results for input(s): VITAMINB12, FOLATE, FERRITIN, TIBC, IRON, RETICCTPCT in the last 72 hours. Urine analysis:    Component Value Date/Time   COLORURINE AMBER (A) 05/01/2018 1146   APPEARANCEUR CLEAR 05/01/2018 1146   LABSPEC 1.019 05/01/2018 1146   PHURINE 5.0 05/01/2018 1146   GLUCOSEU 50 (A) 05/01/2018 1146   HGBUR MODERATE (A) 05/01/2018 1146   BILIRUBINUR NEGATIVE 05/01/2018 1146   KETONESUR NEGATIVE 05/01/2018 1146   PROTEINUR 100 (A) 05/01/2018 1146   NITRITE NEGATIVE 05/01/2018 1146   LEUKOCYTESUR NEGATIVE 05/01/2018 1146   Sepsis Labs: '@LABRCNTIP'$ (procalcitonin:4,lacticidven:4) )No results found for this or any previous visit (from the past 240 hour(s)).   Radiological Exams on Admission: Vas [place] Groin Pseudoaneurysm  Result Date: 06/11/2018  ARTERIAL PSEUDOANEURYSM  Exam: Left groin lump History: Left femoral to above knee pop bypass graft, Left femoral endarterectomy 05/04/2018. Performing Technologist: Alvia Grove RVT  Examination Guidelines: A complete evaluation includes B-mode imaging, spectral Doppler, color Doppler, and power Doppler as needed of all accessible portions of each vessel. Bilateral testing is considered an integral part of a complete examination. Limited examinations for reoccurring indications may be performed as noted. +------------+----------+----------+------+----------+  Right Duplex PSV (cm/s)  Waveform  Plaque Comment(s)  +------------+----------+----------+------+----------+  CFA  113     monophasic          brisk      +------------+----------+----------+------+----------+ +-----------+----------+--------+------+----------+  Left Duplex PSV (cm/s) Waveform Plaque Comment(s)  +-----------+----------+--------+------+----------+  CFA             39     biphasic          graft     +-----------+----------+--------+------+----------+ Left Vein comments:  Summary: Multilobular fluid collection in the left groin at site of lump with the largest measurement of 3.38 x 3.64 with no color flow visualized., No evidence of DVT in the CFV.  Diagnosing physician: Harold Barban MD Electronically signed by Harold Barban MD on 06/11/2018 at 4:00:47 PM.   --------------------------------------------------------------------------------    Final      EKG:   Not done in ED, will get one.   Assessment/Plan Principal Problem:   Wound infection Active Problems:   CAD (coronary artery disease)   Chronic systolic congestive heart failure (HCC)   COPD (chronic obstructive pulmonary disease) (HCC)   ESRD on dialysis (HCC)   GERD (gastroesophageal reflux disease)   Hypercholesterolemia   Essential hypertension   Tobacco use   Uncontrolled type 2 diabetes mellitus with diabetic polyneuropathy, with long-term current use of insulin (HCC)   AICD (automatic cardioverter/defibrillator) present   PAD (peripheral artery disease) (HCC)   Open wound of left thigh   Chronic atrial fibrillation   Cellulitis of left groin   Thrombocytopenia (HCC)   Possible wound infection in left medial thigh and possible cellulitis of left groin: Patient's temperature is 99.4.  No leukocytosis.  Per pt, erythema in the left groin area is new to him.  Clinically not septic. Per EDP, pt was seen by [facility]VS, Clemon Chambers, RN, MSN, FNP-C today and they will see pt in AM.  - will place on tele bed for obs - Empiric antimicrobial treatment with vancomycin - start florastor given hx of C diff colitis - PRN Zofran for nausea, percocet for pain - Blood cultures x 2    - ESR and CRP - wound care consult - keep pt NPO after MN in case pt needs any procedure in AM -f/u VVS recommendation  PAD: s/p of left femoral endarterectomy with patch angioplasty and left femoral to above-the-knee popliteal bypass with PTFE graft by Dr. Donnetta Hutching on 05/04/2018. -f/u VVS recommendation  CAD (coronary artery disease): no CP.  -continue aspirin, Lipitor, metoprolol -As needed nitroglycerin  ESRD on HD (MWF): pt had had a dialysis today.  Potassium 4.2, bicarbonate 28, creatinine 4.22, BUN 27. -please call renal in AM  Chronic systolic congestive heart failure ([place]): 2D echo on 10/31/2017 showed EF 25-30%.  Patient does not have leg edema or JVD.  CHF seems to be compensated. -Continue home dose Lasix -Volume management per renal by dialysis  GERD: -Protonix  COPD (chronic obstructive pulmonary disease) ([place]): Stable -Bronchodilators  Hypercholesterolemia: -lipitor  HTN:  -Continue home medications: Metoprolol, Lasix, -IV hydralazine prn  Tobacco abuse: -Did counseling about importance of quitting smoking -Nicotine patch  Uncontrolled type 2 diabetes mellitus with diabetic polyneuropathy, with long-term current use of insulin, with ESRD: Last A1c 6.0 on 05/01/18, well controled. Patient is taking Lantus as needed at home, did not take Lantus today.  Blood sugar 124. -SSI  Chronic atrial fibrillation: CHA2DS2-VASc Score is 5, needs oral anticoagulation.  Pt was on Eliquis before, which was discontinued by his cardiologist per pt.  Heart rate is well controlled now. -Continue metoprolol and amiodarone  Thrombocytopenia (  [place]): This is a chronic issue.  Platelet 104.  Mental status normal.  No bleeding tendency. -f/u by CBC    DVT ppx: SCD Code Status: Full code Family Communication: Yes, patient's wife at bed side Disposition Plan:  Anticipate discharge back to previous home environment Consults called:  VVS Admission status: Obs / tele   Date of Service  06/11/2018    [facility]   If 7PM-7AM, please contact night-coverage [email or web address] Password [email or web address] 06/11/2018, 10:40 PM

## 2018-06-11 NOTE — ED Triage Notes (Signed)
Pt arrives from vascular office/dialysis center per wife. Pt reports that he had a wound check to his L foot this AM, packing was removed and they advised him to present here for further eval d/t hematoma to L foot. Wife is extremely concerned that packing was removed and not replaced by PCP office. Pt is NAD on arrival. Ambulatory w/ assistance

## 2018-06-11 NOTE — Patient Instructions (Signed)
Steps to Quit Smoking  Smoking tobacco can be bad for your health. It can also affect almost every organ in your body. Smoking puts you and people around you at risk for many serious long-lasting (chronic) diseases. Quitting smoking is hard, but it is one of the best things that you can do for your health. It is never too late to quit. What are the benefits of quitting smoking? When you quit smoking, you lower your risk for getting serious diseases and conditions. They can include:  Lung cancer or lung disease.  Heart disease.  Stroke.  Heart attack.  Not being able to have children (infertility).  Weak bones (osteoporosis) and broken bones (fractures). If you have coughing, wheezing, and shortness of breath, those symptoms may get better when you quit. You may also get sick less often. If you are pregnant, quitting smoking can help to lower your chances of having a baby of low birth weight. What can I do to help me quit smoking? Talk with your doctor about what can help you quit smoking. Some things you can do (strategies) include:  Quitting smoking totally, instead of slowly cutting back how much you smoke over a period of time.  Going to in-person counseling. You are more likely to quit if you go to many counseling sessions.  Using resources and support systems, such as: ? Online chats with a counselor. ? Phone quitlines. ? Printed self-help materials. ? Support groups or group counseling. ? Text messaging programs. ? Mobile phone apps or applications.  Taking medicines. Some of these medicines may have nicotine in them. If you are pregnant or breastfeeding, do not take any medicines to quit smoking unless your doctor says it is okay. Talk with your doctor about counseling or other things that can help you. Talk with your doctor about using more than one strategy at the same time, such as taking medicines while you are also going to in-person counseling. This can help make  quitting easier. What things can I do to make it easier to quit? Quitting smoking might feel very hard at first, but there is a lot that you can do to make it easier. Take these steps:  Talk to your family and friends. Ask them to support and encourage you.  Call phone quitlines, reach out to support groups, or work with a counselor.  Ask people who smoke to not smoke around you.  Avoid places that make you want (trigger) to smoke, such as: ? Bars. ? Parties. ? Smoke-break areas at work.  Spend time with people who do not smoke.  Lower the stress in your life. Stress can make you want to smoke. Try these things to help your stress: ? Getting regular exercise. ? Deep-breathing exercises. ? Yoga. ? Meditating. ? Doing a body scan. To do this, close your eyes, focus on one area of your body at a time from head to toe, and notice which parts of your body are tense. Try to relax the muscles in those areas.  Download or buy apps on your mobile phone or tablet that can help you stick to your quit plan. There are many free apps, such as QuitGuide from the CDC (Centers for Disease Control and Prevention). You can find more support from smokefree.gov and other websites. This information is not intended to replace advice given to you by your health care provider. Make sure you discuss any questions you have with your health care provider. Document Released: 01/08/2009 Document Revised: 11/10/2015   Document Reviewed: 07/29/2014 Elsevier Interactive Patient Education  2019 Elsevier Inc.     Peripheral Vascular Disease  Peripheral vascular disease (PVD) is a disease of the blood vessels that are not part of your heart and brain. A simple term for PVD is poor circulation. In most cases, PVD narrows the blood vessels that carry blood from your heart to the rest of your body. This can reduce the supply of blood to your arms, legs, and internal organs, like your stomach or kidneys. However, PVD most  often affects a person's lower legs and feet. Without treatment, PVD tends to get worse. PVD can also lead to acute ischemic limb. This is when an arm or leg suddenly cannot get enough blood. This is a medical emergency. Follow these instructions at home: Lifestyle  Do not use any products that contain nicotine or tobacco, such as cigarettes and e-cigarettes. If you need help quitting, ask your doctor.  Lose weight if you are overweight. Or, stay at a healthy weight as told by your doctor.  Eat a diet that is low in fat and cholesterol. If you need help, ask your doctor.  Exercise regularly. Ask your doctor for activities that are right for you. General instructions  Take over-the-counter and prescription medicines only as told by your doctor.  Take good care of your feet: ? Wear comfortable shoes that fit well. ? Check your feet often for any cuts or sores.  Keep all follow-up visits as told by your doctor This is important. Contact a doctor if:  You have cramps in your legs when you walk.  You have leg pain when you are at rest.  You have coldness in a leg or foot.  Your skin changes.  You are unable to get or have an erection (erectile dysfunction).  You have cuts or sores on your feet that do not heal. Get help right away if:  Your arm or leg turns cold, numb, and blue.  Your arms or legs become red, warm, swollen, painful, or numb.  You have chest pain.  You have trouble breathing.  You suddenly have weakness in your face, arm, or leg.  You become very confused or you cannot speak.  You suddenly have a very bad headache.  You suddenly cannot see. Summary  Peripheral vascular disease (PVD) is a disease of the blood vessels.  A simple term for PVD is poor circulation. Without treatment, PVD tends to get worse.  Treatment may include exercise, low fat and low cholesterol diet, and quitting smoking. This information is not intended to replace advice given to  you by your health care provider. Make sure you discuss any questions you have with your health care provider. Document Released: 06/08/2009 Document Revised: 04/21/2016 Document Reviewed: 04/21/2016 Elsevier Interactive Patient Education  2019 Elsevier Inc.  

## 2018-06-11 NOTE — ED Provider Notes (Signed)
[facility] EMERGENCY DEPARTMENT Provider Note   CSN: [id number] Arrival date & time: 06/11/18  1541    History   Chief Complaint Chief Complaint  Patient presents with   Wound Check    HPI Jose Sellers is a 58 y.o. male.     HPI   Patient is a 58 year old male with history of A. fib s/p AICD, asthma, CAD, CHF, COPD, diabetes, ESRD (on dialysis MWF), hyperlipidemia, who presents to the emergency department today for evaluation of wound to the left lower extremity and left groin.  Patient had recent left femoral endarterectomy with patch angioplasty and left femoral to above-the-knee popliteal bypass with PTFE graft by Dr. Donnetta Hutching on 05/04/2018.  He presented to the ED on 06/08/2018 with complaints of wound dehiscence to the wound on the left medial thigh.  At that time vascular surgery was consulted recommended packing the wound and having patient follow-up the following week for reevaluation and plan to set up home health.  Patient was seen at vein and vascular surgery prior to arrival.  During evaluation, wound packing was removed.  Wounds were felt to appear infected and he was advised to come to the ED for admission.  ID consult was recommended and vascular surgery agreed to see patient in the ED.  History also obtained by the patient's significant other in the room who states that she has been caring for the wounds.  The left medial thigh wound has appeared more erythematous over the past several days and has been causing the patient increased pain.  She notes that there has been bloody drainage from the wound.  She denies purulent drainage.  She also notes that the seroma to the left groin has become erythematous as of today.  Patient does not have much pain to this area.  There is been no drainage from this wound.  Patient denies fevers at home.  Denies other complaints at this time.  Past Medical History:  Diagnosis Date   A-fib [facility])    AICD (automatic  cardioverter/defibrillator) present    Anal fissure    Anxiety    Asthma    CAD (coronary artery disease)    CHF (congestive heart failure) (HCC)    COPD (chronic obstructive pulmonary disease) (HCC)    Degeneration of lumbar or lumbosacral intervertebral disc    DM (diabetes mellitus), type 2, uncontrolled w/neurologic complication (HCC)    Dyspnea    with exertion   Dysthymic disorder    End stage renal disease on dialysis [facility])    dialysis M-W-F   [employer] in [place] (primary) hypertension    GERD (gastroesophageal reflux disease)    History of hiatal hernia    Hx of colonic polyps    Hyperlipemia    Myocardial infarction ([place])    Peripheral vascular disease (HCC)    Pneumonia    Recurrent major depression (HCC)    Sleep apnea    Ventral hernia without obstruction or gangrene    Wears dentures     Patient Active Problem List   Diagnosis Date Noted   Open wound of left thigh 06/11/2018   Chronic atrial fibrillation 06/11/2018   Cellulitis of left groin 06/11/2018   Thrombocytopenia ([place]) 06/11/2018   Wound infection 06/11/2018   PAD (peripheral artery disease) ([place]) 05/04/2018   AICD (automatic cardioverter/defibrillator) present    Typical atrial flutter ([place]) 10/16/2017   Acute respiratory failure with hypoxia ([place]) 09/08/2017   C. difficile  diarrhea 09/08/2017   Peritonitis associated with peritoneal dialysis ([place]) 08/16/2017   At high risk for falls 07/27/2017   Arteriosclerotic vascular disease 03/16/2017   Chronic heart disease 03/16/2017   ESRD on dialysis [facility]) 03/16/2017   Hypercholesterolemia 03/16/2017   Hypertensive disorder 03/16/2017   Anemia of unknown etiology 07/07/2016   Malaise and fatigue 07/07/2016   Chronic pain syndrome 06/20/2016   Secondary hyperparathyroidism ([place]) 12/16/2015   Stage 5 chronic kidney disease ([place]) 26/83/4196   Chronic systolic congestive heart failure  ([place]) 11/25/2015   Tobacco use 11/25/2015   Ventral hernia without obstruction or gangrene 10/26/2015   Psychosexual dysfunction with inhibited sexual excitement 08/27/2015   Anxiety 08/26/2015   COPD (chronic obstructive pulmonary disease) ([place]) 08/26/2015   DDD (degenerative disc disease), lumbosacral 08/26/2015   GERD (gastroesophageal reflux disease) 08/26/2015   Hypothyroidism 08/26/2015   Microalbuminuria 08/26/2015   Mixed hyperlipidemia 08/26/2015   Osteoarthritis of multiple joints 08/26/2015   Peripheral vascular disease ([place]) 08/26/2015   Abdominal hernia 06/16/2015   CAD (coronary artery disease) 04/07/2015   ICD (implantable cardioverter-defibrillator) in place 04/07/2015   OSA (obstructive sleep apnea) 04/07/2015   Essential hypertension 03/06/2015   Retinopathy, diabetic, bilateral ([place]) 03/06/2015   Uncontrolled type 2 diabetes mellitus with diabetic polyneuropathy, with long-term current use of insulin ([place]) 03/06/2015   Atherosclerosis with claudication of extremity ([place]) 08/01/2014   Mild episode of recurrent major depressive disorder ([place]) 05/01/2013   Panic disorder 05/01/2013    Past Surgical History:  Procedure Laterality Date   ABDOMINAL AORTOGRAM W/LOWER EXTREMITY N/A 04/20/2018   Procedure: ABDOMINAL AORTOGRAM W/LOWER EXTREMITY;  Surgeon: Elam Dutch, MD;  Location: [place] [facility];  Service: Cardiovascular;  Laterality: N/A;   AV FISTULA PLACEMENT Right 02/13/2018   Procedure: CREATION OF RADIOCEPHALIC ARTERIOVENOUS FISTULA RIGHT ARM;  Surgeon: Elam Dutch, MD;  Location: [facility] OR;  Service: Vascular;  Laterality: Right;   CAD s/p cabbage     CARDIAC DEFIBRILLATOR PLACEMENT  2012   CATARACT EXTRACTION W/ INTRAOCULAR LENS IMPLANT     COLONOSCOPY  12/27/2013   Colonic polyps status post polypectomy.  Mild sigmoid diverticulosis. Internal hemorrhoids. Tubular adenoma.    CORONARY ANGIOPLASTY     CORONARY STENT  PLACEMENT     ENDARTERECTOMY Left 05/04/2018   Procedure: LEFT FEMORAL ENDARTERECTOMY;  Surgeon: [name], MD;  Location[facility] OR;  Service: Vascular;  Laterality: Left;   ESOPHAGOGASTRODUODENOSCOPY  12/01/2016   Mild gastritis.    FEMORAL-POPLITEAL BYPASS GRAFT Left 05/04/2018   Procedure: LEFT FEMORAL TO ABOVE KNEE POPLITEAL BYPASS GRAFT USING 6MM X 80CM GORE PROPATEN RINGED GRAFT;  Surgeon: [name], MD;  Location: [facility] OR;  Service: Vascular;  Laterality: Left;   heart bypass     HERNIA REPAIR     KNEE SURGERY     PATCH ANGIOPLASTY Left 05/04/2018   Procedure: Left Common Femoral/Profunda Patch Angioplasty using Hemashield Platinum Finesse Patch;  Surgeon: [name], MD;  Location: [place] OR;  Service: Vascular;  Laterality: Left;        Home Medications    Prior to Admission medications   Medication Sig Start Date End Date Taking? Authorizing Provider  amiodarone (PACERONE) 200 MG tablet Take 200 mg by mouth 2 (two) times daily.    Yes [provider]  aspirin 81 MG tablet Take 81 mg by mouth daily.  01/20/11  Yes [provider]  atorvastatin (LIPITOR) 40 MG tablet Take 40 mg by mouth 2 (two) times daily.  Yes [provider]  B Complex-C-Folic Acid (DIALYVITE 371) 0.8 MG TABS Take 1 tablet by mouth daily.  12/19/17  Yes [provider]  calcitRIOL (ROCALTROL) 0.25 MCG capsule Take 0.25 mcg by mouth every Monday, Wednesday, and Friday with hemodialysis.    Yes [provider]  esomeprazole (NEXIUM) 40 MG capsule Take 40 mg by mouth daily before breakfast.    Yes [provider]  ferric citrate (AURYXIA) 1 GM 210 MG(Fe) tablet Take 420 mg by mouth 3 (three) times daily with meals.    Yes [provider]  FLUoxetine (PROZAC) 20 MG capsule Take 20 mg by mouth daily.  11/07/17  Yes [provider]  furosemide (LASIX) 40 MG tablet Take 40 mg by mouth See admin instructions. Non dialysis days. Do not take on  Monday, Wednesday and Friday   Yes [provider]  gabapentin (NEURONTIN) 300 MG capsule Take 1 capsule (300 mg total) by mouth 3 (three) times daily. Patient taking differently: Take 300 mg by mouth at bedtime.  03/14/18  Yes Stover, Titorya, DPM  Insulin Glargine (LANTUS SOLOSTAR) 100 UNIT/ML Solostar Pen Inject 8-12 Units into the skin at bedtime as needed (blood sugar).    Yes [provider]  insulin lispro (HUMALOG KWIKPEN) 100 UNIT/ML KwikPen Inject 0-32 Units into the skin 3 (three) times daily. Sliding Scale Insulin    Yes [provider]  levalbuterol (XOPENEX) 0.63 MG/3ML nebulizer solution Inhale 0.63 mg into the lungs 2 (two) times daily.  10/09/17  Yes [provider]  lidocaine-prilocaine (EMLA) cream Apply 1 application topically See admin instructions. Apply small amount to access site 1-2 hours prior to dialysis. 01/29/18  Yes [provider]  metoprolol tartrate (LOPRESSOR) 25 MG tablet Take 12.5 mg by mouth 2 (two) times daily.  03/10/18  Yes [provider]  nitroGLYCERIN (NITROSTAT) 0.4 MG SL tablet Place 0.4 mg under the tongue every 5 (five) minutes as needed for chest pain.   Yes [provider]  oxycodone (ROXICODONE) 30 MG immediate release tablet Take 30 mg by mouth every 8 (eight) hours as needed for pain.    Yes [provider]  oxyCODONE-acetaminophen (PERCOCET) 7.5-325 MG tablet Take 1 tablet by mouth every 6 (six) hours as needed for severe pain. 05/06/18  Yes Dagoberto Ligas, PA-C  SANTYL ointment Apply 1 application topically daily. 05/29/18  Yes Early, Arvilla Meres, MD  tiotropium (SPIRIVA) 18 MCG inhalation capsule Place 18 mcg into inhaler and inhale daily.  03/10/18  Yes [provider]    Family History Family History  Problem Relation Age of Onset   Colon cancer Neg Hx     Social History Social History   Tobacco Use   Smoking status: Current Some Day Smoker    Packs/day: 0.25     Years: 35.00    Pack years: 8.75    Types: Cigarettes   Smokeless tobacco: Never Used  Substance Use Topics   Alcohol use: Not Currently   Drug use: Never     Allergies   Albuterol; Prednisone; Fluticasone furoate-vilanterol; and Morphine   Review of Systems Review of Systems  Constitutional: Negative for chills and fever.  HENT: Negative for sore throat.   Eyes: Negative for visual disturbance.  Respiratory: Negative for cough and shortness of breath.   Cardiovascular: Negative for chest pain.  Gastrointestinal: Negative for abdominal pain, constipation, diarrhea, nausea and vomiting.  Genitourinary: Negative for dysuria and hematuria.  Musculoskeletal: Negative for back pain.  LLE pain  Skin: Positive for color change and wound.  Neurological: Negative for headaches.  All other systems reviewed and are negative.  Physical Exam Updated Vital Signs BP (!) 144/76 (BP Location: Left Arm)    Pulse 82    Temp 98.3 F (36.8 C) (Oral)    Resp 18    Ht 6' (1.829 m)    Wt 81 kg    SpO2 96%    BMI 24.22 kg/m   Physical Exam Vitals signs and nursing note reviewed.  Constitutional:      Appearance: He is well-developed.  HENT:     Head: Normocephalic and atraumatic.  Eyes:     Conjunctiva/sclera: Conjunctivae normal.  Neck:     Musculoskeletal: Neck supple.  Cardiovascular:     Rate and Rhythm: Normal rate and regular rhythm.     Heart sounds: Normal heart sounds. No murmur.  Pulmonary:     Effort: Pulmonary effort is normal. No respiratory distress.     Breath sounds: Normal breath sounds. No stridor. No wheezing or rhonchi.  Abdominal:     General: Bowel sounds are normal.     Palpations: Abdomen is soft.     Tenderness: There is no abdominal tenderness.  Musculoskeletal:     Comments: 3x4cm raised area to the left groin that is erythematous and slightly warm to touch. There are some areas of fluctuance within this. No significant induration. No open wound or  drainage. No significant TTP. Additional 1.5 cm open wound to the left medial thigh with muscle exposure. Surrounding skin is erythematous and indurated. There is purulent and bloody drainage present.   Skin:    General: Skin is warm and dry.  Neurological:     Mental Status: He is alert.    ED Treatments / Results  Labs (all labs ordered are listed, but only abnormal results are displayed) Labs Reviewed  CBC WITH DIFFERENTIAL/PLATELET - Abnormal; Notable for the following components:      Result Value   Hemoglobin 12.0 (*)    MCH 24.8 (*)    MCHC 29.6 (*)    RDW 17.5 (*)    Platelets 104 (*)    Eosinophils Absolute 0.7 (*)    All other components within normal limits  COMPREHENSIVE METABOLIC PANEL - Abnormal; Notable for the following components:   Sodium 133 (*)    Chloride 91 (*)    Glucose, Bld 124 (*)    BUN 27 (*)    Creatinine, Ser 4.22 (*)    Albumin 2.8 (*)    Alkaline Phosphatase 155 (*)    Total Bilirubin 1.8 (*)    GFR calc non Af Amer 15 (*)    GFR calc Af Amer 17 (*)    All other components within normal limits  C-REACTIVE PROTEIN - Abnormal; Notable for the following components:   CRP 18.8 (*)    All other components within normal limits  SEDIMENTATION RATE - Abnormal; Notable for the following components:   Sed Rate 30 (*)    All other components within normal limits  GLUCOSE, CAPILLARY - Abnormal; Notable for the following components:   Glucose-Capillary 141 (*)    All other components within normal limits  CULTURE, BLOOD (ROUTINE X 2)  CULTURE, BLOOD (ROUTINE X 2)  LACTIC ACID, PLASMA  PROCALCITONIN  HIV ANTIBODY (ROUTINE TESTING W REFLEX)  BASIC METABOLIC PANEL  CBC    EKG None  Radiology Vas [place] Groin Pseudoaneurysm  Result Date: 06/11/2018  ARTERIAL PSEUDOANEURYSM  Exam: Left groin lump History: Left femoral to above knee pop bypass graft, Left femoral endarterectomy 05/04/2018. Performing Technologist: Alvia Grove RVT  Examination Guidelines:  A complete evaluation includes B-mode imaging, spectral Doppler, color Doppler, and power Doppler as needed of all accessible portions of each vessel. Bilateral testing is considered an integral part of a complete examination. Limited examinations for reoccurring indications may be performed as noted. +------------+----------+----------+------+----------+  Right Duplex PSV (cm/s)  Waveform  Plaque Comment(s)  +------------+----------+----------+------+----------+  CFA             113     monophasic          brisk     +------------+----------+----------+------+----------+ +-----------+----------+--------+------+----------+  Left Duplex PSV (cm/s) Waveform Plaque Comment(s)  +-----------+----------+--------+------+----------+  CFA             39     biphasic          graft     +-----------+----------+--------+------+----------+ Left Vein comments:  Summary: Multilobular fluid collection in the left groin at site of lump with the largest measurement of 3.38 x 3.64 with no color flow visualized., No evidence of DVT in the CFV.  Diagnosing physician: Harold Barban MD Electronically signed by Harold Barban MD on 06/11/2018 at 4:00:47 PM.   --------------------------------------------------------------------------------    Final     Procedures Procedures (including critical care time)  Medications Ordered in ED Medications  nicotine (NICODERM CQ - dosed in mg/24 hours) patch 21 mg (21 mg Transdermal Patch Applied 06/11/18 2300)  acetaminophen (TYLENOL) tablet 650 mg (has no administration in time range)    Or  acetaminophen (TYLENOL) suppository 650 mg (has no administration in time range)  senna-docusate (Senokot-S) tablet 1 tablet (has no administration in time range)  ondansetron (ZOFRAN) tablet 4 mg (has no administration in time range)    Or  ondansetron (ZOFRAN) injection 4 mg (has no administration in time range)  hydrALAZINE (APRESOLINE) injection 5 mg (has no administration in time range)  insulin  aspart (novoLOG) injection 0-9 Units (has no administration in time range)  insulin aspart (novoLOG) injection 0-5 Units (0 Units Subcutaneous Not Given 06/11/18 2300)  saccharomyces boulardii (FLORASTOR) capsule 250 mg (250 mg Oral Given 06/11/18 2300)  aspirin EC tablet 81 mg (81 mg Oral Given 06/11/18 2300)  amiodarone (PACERONE) tablet 200 mg (200 mg Oral Given 06/11/18 2300)  atorvastatin (LIPITOR) tablet 40 mg (40 mg Oral Given 06/11/18 2300)  furosemide (LASIX) tablet 40 mg (has no administration in time range)  metoprolol tartrate (LOPRESSOR) tablet 12.5 mg (12.5 mg Oral Given 06/11/18 2300)  nitroGLYCERIN (NITROSTAT) SL tablet 0.4 mg (has no administration in time range)  FLUoxetine (PROZAC) capsule 20 mg (20 mg Oral Given 06/11/18 2300)  calcitRIOL (ROCALTROL) capsule 0.25 mcg (has no administration in time range)  pantoprazole (PROTONIX) EC tablet 40 mg (has no administration in time range)  ferric citrate (AURYXIA) tablet 420 mg (has no administration in time range)  gabapentin (NEURONTIN) capsule 300 mg (300 mg Oral Given 06/11/18 2300)  multivitamin (RENA-VIT) tablet 1 tablet (1 tablet Oral Given 06/11/18 2300)  levalbuterol (XOPENEX) nebulizer solution 0.63 mg (0.63 mg Inhalation Given 06/11/18 2149)  lidocaine-prilocaine (EMLA) cream 1 application (has no administration in time range)  collagenase (SANTYL) ointment 1 application (1 application Topical Given 06/11/18 2300)  vancomycin variable dose per unstable renal function (pharmacist dosing) (has no administration in time range)  oxyCODONE-acetaminophen (PERCOCET/ROXICET) 5-325 MG per tablet 1 tablet (1 tablet Oral Given 06/11/18 2301)    And  oxyCODONE (Oxy IR/ROXICODONE) immediate release tablet 2.5 mg (2.5 mg Oral Given 06/11/18 2301)  umeclidinium bromide (INCRUSE ELLIPTA) 62.5 MCG/INH 1 puff (1 puff Inhalation Not Given 06/11/18 2305)  ipratropium (ATROVENT) nebulizer solution 0.5 mg (has no administration in time range)  vancomycin  (VANCOCIN) 1,500 mg in sodium chloride 0.9 % 500 mL IVPB (1,500 mg Intravenous New Bag/Given 06/11/18 2100)     Initial Impression / Assessment and Plan / ED Course  I have reviewed the triage vital signs and the nursing notes.  Pertinent labs & imaging results that were available during my care of the patient were reviewed by me and considered in my medical decision making (see chart for details).        Final Clinical Impressions(s) / ED Diagnoses   Final diagnoses:  Wound infection   Pt with recent left femoral to above knee pop bypass graft, Left femoral endarterectomy 05/04/2018 (Dr. Donnetta Hutching) presents with wound infection to left medial thigh and with erythema/suspected cellulitis to seroma/hematoma in the left groin region. Has borderline temp of 19F in the ED. Otherwise VS are reassuring and pt does not meet sepsis criteria.   Cbc does not show leukocytosis CMP with mild electrolyte derangement with hyponatremia, hypochloremia. Elevated bun/cr consistent with h/o ESRD on dialysis with recent dialysis today. Alk phos and total bilirubin elevated consistent with prior. No elevated anion gap Lactic acid is negative  Blood cultures obtained prior to administration of abx  [place] reviewed from earlier today shows multilobular fluid collection in the left groin at site of lump with the largest measurement of 3.38 x 3.64 with no color flow visualized. No evidence of DVT in the CFV.  6:17 PM Discussed case with Dr. Donzetta Matters who will see the pt in the AM  Infectious disease was consulted x2 however I was unable to get in touch with service.   Case was discussed with Dr. Blaine Hamper who accepts patient for admission.   ED Discharge Orders    None       Bishop [place] 06/11/18 2334    Dorie Rank, MD 06/12/18 1505

## 2018-06-11 NOTE — Progress Notes (Signed)
[facility] [place]   CC: Follow up peripheral artery occlusive disease  History of Present Illness Jose Sellers is a 58 y.o. male who is s/p left femoral endarterectomy and Dacron patch angioplasty and left femoral to above-knee popliteal bypass with 6 mm Gore-Tex 05/04/2018 by Dr. Donnetta Hutching.  He had presented with critical limb ischemia with nonhealing ulceration of his left great toe.  He underwent the above bypass and was discharged home.  His wife is doing a great job of taking care of his ulceration on his great toe.  Dr. Donnetta Hutching last evaluated pt on 05-29-18. At that time pt has a palpable popliteal pulse.  His left groin had a slight lymphocele.  No erythema.  His left above-knee popliteal incision has a slight eschar.  Also has an eschar at his old radiocephalic anastomosis on the right with no evidence of involvement of the vein below. Had  some full-thickness loss on the medial aspect of his great toe and the tip of his great toe but no invasive infection. Doing well overall. Continue local wound care with Santyl to his toe.  Pt to return in 1 month with noninvasive follow-up.  Pt resented to the ED on 06-08-18 with seroma at left medial thigh incision that opened up with no evidence of infection. No evidence of infection to indicate antibiotics.  ED provider discussed with Dr. Donzetta Matters who recommended packing and follow-up on Friday to try to get home health or the patient in the office for further packing changes and wound exploration.  Wife states pt sees podiatrist, dressings on left great toe and heel, states wounds are healing.   Pt returns today as per above.   Pt dialyzes M-W-F via left forearm AVF, still has right IJ TDC, states planning to remove TDC soon.      Diabetic: Yes, 6.0 A1C on 05-01-18, well controlled Tobacco use: smoker  (1/4 ppd)   Past Medical History:  Diagnosis Date  . A-fib ([place])   . AICD (automatic cardioverter/defibrillator) present    . Anal fissure   . Anxiety   . Asthma   . CAD (coronary artery disease)   . CHF (congestive heart failure) ([place])   . COPD (chronic obstructive pulmonary disease) ([place])   . Degeneration of lumbar or lumbosacral intervertebral disc   . DM (diabetes mellitus), type 2, uncontrolled w/neurologic complication ([place])   . Dyspnea    with exertion  . Dysthymic disorder   . End stage renal disease on dialysis [facility])    dialysis M-W-F   [address] in [place]  . Essential (primary) hypertension   . GERD (gastroesophageal reflux disease)   . History of hiatal hernia   . Hx of colonic polyps   . Hyperlipemia   . Myocardial infarction ([place])   . Peripheral vascular disease ([place])   . Pneumonia   . Recurrent major depression ([place])   . Sleep apnea   . Ventral hernia without obstruction or gangrene   . Wears dentures     Social History Social History   Tobacco Use  . Smoking status: Current Some Day Smoker    Packs/day: 0.25    Years: 35.00    Pack years: 8.75    Types: Cigarettes  . Smokeless tobacco: Never Used  Substance Use Topics  . Alcohol use: Not Currently  . Drug use: Never    Family History Family History  Problem Relation Age of Onset  . Colon cancer Neg Hx  Past Surgical History:  Procedure Laterality Date  . ABDOMINAL AORTOGRAM W/LOWER EXTREMITY N/A 04/20/2018   Procedure: ABDOMINAL AORTOGRAM W/LOWER EXTREMITY;  Surgeon: Elam Dutch, MD;  Location: [place] [facility];  Service: Cardiovascular;  Laterality: N/A;  . AV FISTULA PLACEMENT Right 02/13/2018   Procedure: CREATION OF RADIOCEPHALIC ARTERIOVENOUS FISTULA RIGHT ARM;  Surgeon: Elam Dutch, MD;  Location: [place];  Service: Vascular;  Laterality: Right;  . CAD s/p cabbage    . CARDIAC DEFIBRILLATOR PLACEMENT  2012  . CATARACT EXTRACTION W/ INTRAOCULAR LENS IMPLANT    . COLONOSCOPY  12/27/2013   Colonic polyps status post polypectomy.  Mild sigmoid diverticulosis. Internal hemorrhoids. Tubular  adenoma.   . CORONARY ANGIOPLASTY    . CORONARY STENT PLACEMENT    . ENDARTERECTOMY Left 05/04/2018   Procedure: LEFT FEMORAL ENDARTERECTOMY;  Surgeon: [name], MD;  Location: [facility] OR;  Service: Vascular;  Laterality: Left;  . ESOPHAGOGASTRODUODENOSCOPY  12/01/2016   Mild gastritis.   . FEMORAL-POPLITEAL BYPASS GRAFT Left 05/04/2018   Procedure: LEFT FEMORAL TO ABOVE KNEE POPLITEAL BYPASS GRAFT USING 6MM X 80CM GORE PROPATEN RINGED GRAFT;  Surgeon: [name], MD;  [facility];  Service: Vascular;  Laterality: Left;  . heart bypass    . HERNIA REPAIR    . KNEE SURGERY    . PATCH ANGIOPLASTY Left 05/04/2018   Procedure: Left Common Femoral/Profunda Patch Angioplasty using Hemashield Platinum Finesse Patch;  Surgeon: [name], MD;  Location: [place];  Service: Vascular;  Laterality: Left;    Allergies  Allergen Reactions  . Albuterol Palpitations    SVT  . Prednisone Shortness Of Breath    Wife stated that patient develops breathing issues.  . Fluticasone Furoate-Vilanterol Palpitations  . Morphine Rash    Current Outpatient Medications  Medication Sig Dispense Refill  . amiodarone (PACERONE) 200 MG tablet Take 200 mg by mouth 2 (two) times daily.     Marland Kitchen aspirin 81 MG tablet Take 81 mg by mouth daily.     Marland Kitchen atorvastatin (LIPITOR) 40 MG tablet Take 40 mg by mouth 2 (two) times daily.     . B Complex-C-Folic Acid (DIALYVITE 734) 0.8 MG TABS Take 1 tablet by mouth daily.   4  . calcitRIOL (ROCALTROL) 0.25 MCG capsule Take 0.25 mcg by mouth every Monday, Wednesday, and Friday with hemodialysis.     Marland Kitchen esomeprazole (NEXIUM) 40 MG capsule Take 40 mg by mouth daily before breakfast.     . ferric citrate (AURYXIA) 1 GM 210 MG(Fe) tablet Take 420 mg by mouth 3 (three) times daily with meals.     Marland Kitchen FLUoxetine (PROZAC) 20 MG capsule Take 20 mg by mouth daily.     . furosemide (LASIX) 40 MG tablet Take 40 mg by mouth See admin instructions. Non dialysis days. Do not take on Monday,  Wednesday and Friday    . gabapentin (NEURONTIN) 300 MG capsule Take 1 capsule (300 mg total) by mouth 3 (three) times daily. (Patient taking differently: Take 300 mg by mouth at bedtime. ) 90 capsule 3  . Insulin Glargine (LANTUS SOLOSTAR) 100 UNIT/ML Solostar Pen Inject 8-12 Units into the skin at bedtime as needed (blood sugar).     . insulin lispro (HUMALOG KWIKPEN) 100 UNIT/ML KwikPen Inject 0-32 Units into the skin 3 (three) times daily. Sliding Scale Insulin     . levalbuterol (XOPENEX) 0.63 MG/3ML nebulizer solution Inhale 0.63 mg into the lungs 2 (two) times daily.   1  .  lidocaine-prilocaine (EMLA) cream Apply 1 application topically See admin instructions. Apply small amount to access site 1-2 hours prior to dialysis.  12  . metoprolol tartrate (LOPRESSOR) 25 MG tablet Take 12.5 mg by mouth 2 (two) times daily.     Marland Kitchen oxycodone (ROXICODONE) 30 MG immediate release tablet Take 30 mg by mouth every 8 (eight) hours as needed for pain.     Marland Kitchen oxyCODONE-acetaminophen (PERCOCET) 7.5-325 MG tablet Take 1 tablet by mouth every 6 (six) hours as needed for severe pain. 25 tablet 0  . SANTYL ointment Apply 1 application topically daily. 15 g 1  . tiotropium (SPIRIVA) 18 MCG inhalation capsule Place 18 mcg into inhaler and inhale daily.     . nitroGLYCERIN (NITROSTAT) 0.4 MG SL tablet Place 0.4 mg under the tongue every 5 (five) minutes as needed for chest pain.     No current facility-administered medications for this visit.     ROS: See HPI for pertinent positives and negatives.   Physical Examination  Vitals:   06/11/18 1227  BP: (!) 145/77  Pulse: 82  Resp: 18  Temp: 99.4 F (37.4 C)  TempSrc: Oral  SpO2: 95%  Weight: 178 lb (80.7 kg)  Height: 6' (1.829 m)   Body mass index is 24.14 kg/m.  General: A&O x 3, chronically ill appearing elderly male. Gait: seated in w/c HENT: No gross abnormalities.  Eyes: PERRLA. Pulmonary: Respirations are slightly labored at rest, linited air  movement in all fields, no rales, rhonchi, or wheezing.  Cardiac: irregular rhythm, controlled rate, no detected murmur.      Radial pulses are 2+ palpable bilaterally  Right forearm AVF with palpable thrill. Right IJ TDC in place Adominal aortic pulse is not palpable                         VASCULAR EXAM: Extremities without ischemic changes, without Gangrene; with open wound at left thigh medial incision, 2 cm transverse depth, muscle visible, no purulence.  Left groin mass, appears to be seroma or hematoma, is red, infected appearing, no open wound at left groin.                                                                                                           LE Pulses Right Left       FEMORAL  2+ palpable  1+ palpable        POPLITEAL  not palpable   not palpable       POSTERIOR TIBIAL  not palpable   not palpable, biphasic Doppler signals        DORSALIS PEDIS      ANTERIOR TIBIAL not palpable  not palpable, biphasic Doppler signals    Abdomen: soft, NT, no palpable masses, see Extremities. Skin: no rashes, see Extremities. Musculoskeletal: no muscle wasting or atrophy.  Neurologic: A&O X 3; appropriate affect, Sensation is normal; MOTOR FUNCTION:  moving all extremities equally, motor strength 4/5 throughout. Speech is fluent/normal. CN 2-12 intact. Psychiatric: Thought content is normal,  mood appropriate for clinical situation.     ASSESSMENT: KINNIE KAUPP is a 58 y.o. male who is s/p left femoral endarterectomy and Dacron patch angioplasty and left femoral to above-knee popliteal bypass with 6 mm Gore-Tex 05/04/2018 by Dr. Donnetta Hutching.  Pt presents today with an open wound at his left medial thigh incision, and reddened skin over left groin hematoma or seroma. He has a low grade fever, but he denies feeling fever or chills.  Biphasic Doppler signals at left foot.  Dr. Trula Slade spoke with wife and pt. Will perform duplex of left groin reddened mass: 2 lobulated masses  with no flow found in the left groin, likely seromas or hematomas. I discussed this with Dr. Trula Slade. Call placed to Dr. Bea Graff by wife not yet returned, to determine which antibx he prefers to address infected appearing left groin hematoma, since pt has had c-diff. Pt needs admission to [facility] via ED and hospitalist, infectious disease needs to be involved. Our vascular surgeons will also see pt in hospital. Wife then asked me about possibly taking pt to [facility] ED. I suggested she n=may take him to either hospital.   Wife stated she will drive pt to ED, but will not wait in the waiting room very long due to the concern about contracting illnesses from others in the waiting area.    PLAN:  Based on the patient's vascular studies and examination, pt wife to drive pt to [facility] ED or [place] ED.  I discussed in depth with the patient the nature of atherosclerosis, and emphasized the importance of maximal medical management including strict control of blood pressure, blood glucose, and lipid levels, obtaining regular exercise, and cessation of smoking.  The patient is aware that without maximal medical management the underlying atherosclerotic disease process will progress, limiting the benefit of any interventions.  The patient was given information about PAD including signs, symptoms, treatment, what symptoms should prompt the patient to seek immediate medical care, and risk reduction measures to take.  Clemon Chambers, RN, MSN, FNP-C [facility] [employer] Phone: [phone]  Clinic MD: Trula Slade  06/11/18 1:14 PM

## 2018-06-11 NOTE — ED Notes (Signed)
ED TO INPATIENT HANDOFF REPORT  ED Nurse Name and Phone #:  Chong Sicilian, RN [phone]  S Name/Age/Gender Jose Sellers 58 y.o. male Room/Bed: 030C/030C  Code Status   Code Status: Full Code  Home/SNF/Other Home Patient oriented to: self, place, time and situation Is this baseline? Yes   Triage Complete: Triage complete  Chief Complaint open wound  Triage Note Pt arrives from vascular office/dialysis center per wife. Pt reports that he had a wound check to his L foot this AM, packing was removed and they advised him to present here for further eval d/t hematoma to L foot. Wife is extremely concerned that packing was removed and not replaced by PCP office. Pt is NAD on arrival. Ambulatory w/ assistance    Allergies Allergies  Allergen Reactions  . Albuterol Palpitations    SVT  . Prednisone Shortness Of Breath    Wife stated that patient develops breathing issues.  . Fluticasone Furoate-Vilanterol Palpitations  . Morphine Rash    Level of Care/Admitting Diagnosis ED Disposition    ED Disposition Condition [facility] Area: [place] [100100]  Level of Care: Telemetry Medical [104]  I expect the patient will be discharged within 24 hours: No (not a candidate for 5C-Observation unit)  Diagnosis: Left groin pain [937169]  Admitting Physician: Ivor Costa [[id number]]  Attending Physician: Ivor Costa [[id number]]  PT Class (Do Not Modify): Observation [104]  PT Acc Code (Do Not Modify): Observation [10022]       B Medical/Surgery History Past Medical History:  Diagnosis Date  . A-fib ([place])   . AICD (automatic cardioverter/defibrillator) present   . Anal fissure   . Anxiety   . Asthma   . CAD (coronary artery disease)   . CHF (congestive heart failure) ([place])   . COPD (chronic obstructive pulmonary disease) ([place])   . Degeneration of lumbar or lumbosacral intervertebral disc   . DM (diabetes mellitus), type 2, uncontrolled w/neurologic  complication ([place])   . Dyspnea    with exertion  . Dysthymic disorder   . End stage renal disease on dialysis [facility])    dialysis M-W-F   [address] in [place]  . Essential (primary) hypertension   . GERD (gastroesophageal reflux disease)   . History of hiatal hernia   . Hx of colonic polyps   . Hyperlipemia   . Myocardial infarction ([place])   . Peripheral vascular disease ([place])   . Pneumonia   . Recurrent major depression ([place])   . Sleep apnea   . Ventral hernia without obstruction or gangrene   . Wears dentures    Past Surgical History:  Procedure Laterality Date  . ABDOMINAL AORTOGRAM W/LOWER EXTREMITY N/A 04/20/2018   Procedure: ABDOMINAL AORTOGRAM W/LOWER EXTREMITY;  Surgeon: Elam Dutch, MD;  Location: [place] [facility];  Service: Cardiovascular;  Laterality: N/A;  . AV FISTULA PLACEMENT Right 02/13/2018   Procedure: CREATION OF RADIOCEPHALIC ARTERIOVENOUS FISTULA RIGHT ARM;  Surgeon: Elam Dutch, MD;  Location: [place];  Service: Vascular;  Laterality: Right;  . CAD s/p cabbage    . CARDIAC DEFIBRILLATOR PLACEMENT  2012  . CATARACT EXTRACTION W/ INTRAOCULAR LENS IMPLANT    . COLONOSCOPY  12/27/2013   Colonic polyps status post polypectomy.  Mild sigmoid diverticulosis. Internal hemorrhoids. Tubular adenoma.   . CORONARY ANGIOPLASTY    . CORONARY STENT PLACEMENT    . ENDARTERECTOMY Left 05/04/2018   Procedure: LEFT FEMORAL ENDARTERECTOMY;  Surgeon: [name], MD;  [facility];  Service: Vascular;  Laterality: Left;  . ESOPHAGOGASTRODUODENOSCOPY  12/01/2016   Mild gastritis.   . FEMORAL-POPLITEAL BYPASS GRAFT Left 05/04/2018   Procedure: LEFT FEMORAL TO ABOVE KNEE POPLITEAL BYPASS GRAFT USING 6MM X 80CM GORE PROPATEN RINGED GRAFT;  Surgeon: [name], MD;  [facility];  Service: Vascular;  Laterality: Left;  . heart bypass    . HERNIA REPAIR    . KNEE SURGERY    . PATCH ANGIOPLASTY Left 05/04/2018   Procedure: Left Common Femoral/Profunda Patch  Angioplasty using Hemashield Platinum Finesse Patch;  Surgeon: [name], MD;  [facility];  Service: Vascular;  Laterality: Left;     A IV Location/Drains/Wounds Patient Lines/Drains/Airways Status   Active Line/Drains/Airways    Name:   Placement date:   Placement time:   Site:   Days:   Peripheral IV 06/11/18 Left Hand   06/11/18    1647    Hand   less than 1   Fistula / Graft Right Forearm Arteriovenous fistula   02/13/18    1025    Forearm   118   Hemodialysis Catheter Right Subclavian Double-lumen   -    -    Subclavian      Incision (Closed) 02/13/18 Arm Right   02/13/18    1055     118   Incision (Closed) 05/04/18 Groin Left   05/04/18    1111     38   Incision (Closed) 05/04/18 Thigh Left   05/04/18    1111     38          Intake/Output Last 24 hours No intake or output data in the 24 hours ending 06/11/18 2001  Labs/Imaging Results for orders placed or performed during the hospital encounter of 06/11/18 (from the past 48 hour(s))  CBC with Differential     Status: Abnormal   Collection Time: 06/11/18  4:33 PM  Result Value Ref Range   WBC 8.3 4.0 - 10.5 K/uL   RBC 4.83 4.22 - 5.81 MIL/uL   Hemoglobin 12.0 (L) 13.0 - 17.0 g/dL   HCT 40.5 39.0 - 52.0 %   MCV 83.9 80.0 - 100.0 fL   MCH 24.8 (L) 26.0 - 34.0 pg   MCHC 29.6 (L) 30.0 - 36.0 g/dL   RDW 17.5 (H) 11.5 - 15.5 %   Platelets 104 (L) 150 - 400 K/uL    Comment: REPEATED TO VERIFY PLATELET COUNT CONFIRMED BY SMEAR SPECIMEN CHECKED FOR CLOTS Immature Platelet Fraction may be clinically indicated, consider ordering this additional test [id number]    nRBC 0.0 0.0 - 0.2 %   Neutrophils Relative % 74 %   Neutro Abs 6.1 1.7 - 7.7 K/uL   Lymphocytes Relative 11 %   Lymphs Abs 0.9 0.7 - 4.0 K/uL   Monocytes Relative 7 %   Monocytes Absolute 0.6 0.1 - 1.0 K/uL   Eosinophils Relative 8 %   Eosinophils Absolute 0.7 (H) 0.0 - 0.5 K/uL   Basophils Relative 0 %   Basophils Absolute 0.0 0.0 - 0.1 K/uL    Immature Granulocytes 0 %   Abs Immature Granulocytes 0.03 0.00 - 0.07 K/uL    Comment: Performed at [facility], [address], [place], [place] [address]  Comprehensive metabolic panel     Status: Abnormal   Collection Time: 06/11/18  4:33 PM  Result Value Ref Range   Sodium 133 (L) 135 - 145 mmol/L   Potassium 4.2  3.5 - 5.1 mmol/L    Comment: HEMOLYSIS AT THIS LEVEL MAY AFFECT RESULT   Chloride 91 (L) 98 - 111 mmol/L   CO2 28 22 - 32 mmol/L   Glucose, Bld 124 (H) 70 - 99 mg/dL   BUN 27 (H) 6 - 20 mg/dL   Creatinine, Ser 4.22 (H) 0.61 - 1.24 mg/dL   Calcium 9.4 8.9 - 10.3 mg/dL   Total Protein 6.9 6.5 - 8.1 g/dL   Albumin 2.8 (L) 3.5 - 5.0 g/dL   AST 30 15 - 41 U/L   ALT 17 0 - 44 U/L   Alkaline Phosphatase 155 (H) 38 - 126 U/L   Total Bilirubin 1.8 (H) 0.3 - 1.2 mg/dL   GFR calc non Af Amer 15 (L) >60 mL/min   GFR calc Af Amer 17 (L) >60 mL/min   Anion gap 14 5 - 15    Comment: Performed at [facility] [address], [place], [place] [address]  Lactic acid, plasma     Status: None   Collection Time: 06/11/18  4:33 PM  Result Value Ref Range   Lactic Acid, Venous 1.4 0.5 - 1.9 mmol/L    Comment: Performed at [facility] [address], [place], [place] [address]   Vas [place] Groin Pseudoaneurysm  Result Date: 06/11/2018  ARTERIAL PSEUDOANEURYSM  Exam: Left groin lump History: Left femoral to above knee pop bypass graft, Left femoral endarterectomy 05/04/2018. Performing Technologist: Alvia Grove RVT  Examination Guidelines: A complete evaluation includes B-mode imaging, spectral Doppler, color Doppler, and power Doppler as needed of all accessible portions of each vessel. Bilateral testing is considered an integral part of a complete examination. Limited examinations for reoccurring indications may be performed as noted. +------------+----------+----------+------+----------+ Right DuplexPSV (cm/s) Waveform PlaqueComment(s)  +------------+----------+----------+------+----------+ CFA            113    monophasic        brisk    +------------+----------+----------+------+----------+ +-----------+----------+--------+------+----------+ Left DuplexPSV (cm/s)WaveformPlaqueComment(s) +-----------+----------+--------+------+----------+ CFA            39    biphasic        graft    +-----------+----------+--------+------+----------+ Left Vein comments:  Summary: Multilobular fluid collection in the left groin at site of lump with the largest measurement of 3.38 x 3.64 with no color flow visualized., No evidence of DVT in the CFV.  Diagnosing physician: Harold Barban MD Electronically signed by Harold Barban MD on 06/11/2018 at 4:00:47 PM.   --------------------------------------------------------------------------------    Final     Pending Labs Unresulted Labs (From admission, onward)    Start     Ordered   06/12/18 9233  Basic metabolic panel  Tomorrow morning,   R     06/11/18 1933   06/12/18 0500  CBC  Tomorrow morning,   R     06/11/18 1933   06/11/18 1932  HIV antibody (Routine Testing)  Once,   R     06/11/18 1933   06/11/18 1929  C-reactive protein  Once,   R     06/11/18 1928   06/11/18 1929  Sedimentation rate  Once,   R     06/11/18 1928   06/11/18 1929  Procalcitonin  ONCE - STAT,   R     06/11/18 1929   06/11/18 1621  Blood culture (routine x 2)  BLOOD CULTURE X 2,   STAT     06/11/18 1620          Vitals/Pain Today's  Vitals   06/11/18 1800 06/11/18 1830 06/11/18 1900 06/11/18 1930  BP: (!) 161/91 (!) 143/71 (!) 145/80 133/75  Pulse: 83 80 82 80  Resp: 16 16    Temp:      TempSrc:      SpO2: 98% 98% 96% 97%  Weight:      Height:      PainSc:        Isolation Precautions No active isolations  Medications Medications  nicotine (NICODERM CQ - dosed in mg/24 hours) patch 21 mg (has no administration in time range)  acetaminophen (TYLENOL) tablet 650 mg (has no administration  in time range)    Or  acetaminophen (TYLENOL) suppository 650 mg (has no administration in time range)  senna-docusate (Senokot-S) tablet 1 tablet (has no administration in time range)  ondansetron (ZOFRAN) tablet 4 mg (has no administration in time range)    Or  ondansetron (ZOFRAN) injection 4 mg (has no administration in time range)  hydrALAZINE (APRESOLINE) injection 5 mg (has no administration in time range)  insulin aspart (novoLOG) injection 0-9 Units (has no administration in time range)  insulin aspart (novoLOG) injection 0-5 Units (has no administration in time range)    Mobility walks with device Low fall risk   Focused Assessments skin assessment - wound to medial side of left thigh. Deep w/ muscle visible.    R Recommendations: See Admitting Provider Note  Report given to:   Additional Notes:  Dialysis pt - M/W/F. Did not complete today's dialysis. Was taken off ~ 1 hr before ending. He has not urinated since 0530 this morning but is known to go several days w/o urinating.

## 2018-06-12 ENCOUNTER — Encounter (HOSPITAL_COMMUNITY): Payer: Self-pay | Admitting: *Deleted

## 2018-06-12 DIAGNOSIS — L03314 Cellulitis of groin: Secondary | ICD-10-CM

## 2018-06-12 DIAGNOSIS — K219 Gastro-esophageal reflux disease without esophagitis: Secondary | ICD-10-CM | POA: Diagnosis present

## 2018-06-12 DIAGNOSIS — N2581 Secondary hyperparathyroidism of renal origin: Secondary | ICD-10-CM | POA: Diagnosis present

## 2018-06-12 DIAGNOSIS — L089 Local infection of the skin and subcutaneous tissue, unspecified: Secondary | ICD-10-CM | POA: Diagnosis not present

## 2018-06-12 DIAGNOSIS — T148XXA Other injury of unspecified body region, initial encounter: Secondary | ICD-10-CM | POA: Diagnosis not present

## 2018-06-12 DIAGNOSIS — I255 Ischemic cardiomyopathy: Secondary | ICD-10-CM | POA: Diagnosis present

## 2018-06-12 DIAGNOSIS — I252 Old myocardial infarction: Secondary | ICD-10-CM | POA: Diagnosis not present

## 2018-06-12 DIAGNOSIS — E1165 Type 2 diabetes mellitus with hyperglycemia: Secondary | ICD-10-CM | POA: Diagnosis present

## 2018-06-12 DIAGNOSIS — I472 Ventricular tachycardia: Secondary | ICD-10-CM | POA: Diagnosis not present

## 2018-06-12 DIAGNOSIS — G4730 Sleep apnea, unspecified: Secondary | ICD-10-CM | POA: Diagnosis present

## 2018-06-12 DIAGNOSIS — N186 End stage renal disease: Secondary | ICD-10-CM

## 2018-06-12 DIAGNOSIS — E785 Hyperlipidemia, unspecified: Secondary | ICD-10-CM | POA: Diagnosis present

## 2018-06-12 DIAGNOSIS — Z95810 Presence of automatic (implantable) cardiac defibrillator: Secondary | ICD-10-CM | POA: Diagnosis not present

## 2018-06-12 DIAGNOSIS — E1151 Type 2 diabetes mellitus with diabetic peripheral angiopathy without gangrene: Secondary | ICD-10-CM | POA: Diagnosis present

## 2018-06-12 DIAGNOSIS — D62 Acute posthemorrhagic anemia: Secondary | ICD-10-CM | POA: Diagnosis not present

## 2018-06-12 DIAGNOSIS — T827XXA Infection and inflammatory reaction due to other cardiac and vascular devices, implants and grafts, initial encounter: Secondary | ICD-10-CM | POA: Diagnosis present

## 2018-06-12 DIAGNOSIS — E1122 Type 2 diabetes mellitus with diabetic chronic kidney disease: Secondary | ICD-10-CM | POA: Diagnosis present

## 2018-06-12 DIAGNOSIS — E1142 Type 2 diabetes mellitus with diabetic polyneuropathy: Secondary | ICD-10-CM | POA: Diagnosis present

## 2018-06-12 DIAGNOSIS — T8131XA Disruption of external operation (surgical) wound, not elsewhere classified, initial encounter: Secondary | ICD-10-CM | POA: Diagnosis present

## 2018-06-12 DIAGNOSIS — I482 Chronic atrial fibrillation, unspecified: Secondary | ICD-10-CM | POA: Diagnosis present

## 2018-06-12 DIAGNOSIS — A0472 Enterocolitis due to Clostridium difficile, not specified as recurrent: Secondary | ICD-10-CM | POA: Diagnosis present

## 2018-06-12 DIAGNOSIS — L97429 Non-pressure chronic ulcer of left heel and midfoot with unspecified severity: Secondary | ICD-10-CM | POA: Diagnosis present

## 2018-06-12 DIAGNOSIS — I132 Hypertensive heart and chronic kidney disease with heart failure and with stage 5 chronic kidney disease, or end stage renal disease: Secondary | ICD-10-CM | POA: Diagnosis present

## 2018-06-12 DIAGNOSIS — Z992 Dependence on renal dialysis: Secondary | ICD-10-CM

## 2018-06-12 DIAGNOSIS — I2510 Atherosclerotic heart disease of native coronary artery without angina pectoris: Secondary | ICD-10-CM | POA: Diagnosis present

## 2018-06-12 DIAGNOSIS — I10 Essential (primary) hypertension: Secondary | ICD-10-CM | POA: Diagnosis not present

## 2018-06-12 DIAGNOSIS — J449 Chronic obstructive pulmonary disease, unspecified: Secondary | ICD-10-CM | POA: Diagnosis present

## 2018-06-12 DIAGNOSIS — Y832 Surgical operation with anastomosis, bypass or graft as the cause of abnormal reaction of the patient, or of later complication, without mention of misadventure at the time of the procedure: Secondary | ICD-10-CM | POA: Diagnosis present

## 2018-06-12 DIAGNOSIS — E11621 Type 2 diabetes mellitus with foot ulcer: Secondary | ICD-10-CM | POA: Diagnosis present

## 2018-06-12 DIAGNOSIS — T82898A Other specified complication of vascular prosthetic devices, implants and grafts, initial encounter: Secondary | ICD-10-CM | POA: Diagnosis not present

## 2018-06-12 DIAGNOSIS — I5022 Chronic systolic (congestive) heart failure: Secondary | ICD-10-CM | POA: Diagnosis present

## 2018-06-12 DIAGNOSIS — R1032 Left lower quadrant pain: Secondary | ICD-10-CM | POA: Diagnosis present

## 2018-06-12 LAB — BASIC METABOLIC PANEL
Anion gap: 12 (ref 5–15)
BUN: 31 mg/dL — ABNORMAL HIGH (ref 6–20)
CO2: 30 mmol/L (ref 22–32)
Calcium: 9.4 mg/dL (ref 8.9–10.3)
Chloride: 92 mmol/L — ABNORMAL LOW (ref 98–111)
Creatinine, Ser: 4.63 mg/dL — ABNORMAL HIGH (ref 0.61–1.24)
GFR calc Af Amer: 15 mL/min — ABNORMAL LOW (ref >60)
GFR calc non Af Amer: 13 mL/min — ABNORMAL LOW (ref >60)
GLUCOSE: 108 mg/dL — AB (ref 70–99)
Potassium: 3.7 mmol/L (ref 3.5–5.1)
Sodium: 134 mmol/L — ABNORMAL LOW (ref 135–145)

## 2018-06-12 LAB — CBC
HCT: 34.7 % — ABNORMAL LOW (ref 39.0–52.0)
Hemoglobin: 10.7 g/dL — ABNORMAL LOW (ref 13.0–17.0)
MCH: 25.7 pg — AB (ref 26.0–34.0)
MCHC: 30.8 g/dL (ref 30.0–36.0)
MCV: 83.2 fL (ref 80.0–100.0)
Platelets: 101 10*3/uL — ABNORMAL LOW (ref 150–400)
RBC: 4.17 MIL/uL — ABNORMAL LOW (ref 4.22–5.81)
RDW: 17.2 % — ABNORMAL HIGH (ref 11.5–15.5)
WBC: 9 10*3/uL (ref 4.0–10.5)
nRBC: 0 % (ref 0.0–0.2)

## 2018-06-12 LAB — GLUCOSE, CAPILLARY
Glucose-Capillary: 92 mg/dL (ref 70–99)
Glucose-Capillary: 98 mg/dL (ref 70–99)
Glucose-Capillary: 141 mg/dL — ABNORMAL HIGH (ref 70–99)
Glucose-Capillary: 128 mg/dL — ABNORMAL HIGH (ref 70–99)

## 2018-06-12 LAB — MRSA PCR SCREENING: MRSA by PCR: POSITIVE — AB

## 2018-06-12 LAB — HIV ANTIBODY (ROUTINE TESTING W REFLEX): HIV Screen 4th Generation wRfx: NONREACTIVE

## 2018-06-12 MED ORDER — VANCOMYCIN 50 MG/ML ORAL SOLUTION
125.0000 mg | Freq: Four times a day (QID) | ORAL | Status: DC
  Administered 2018-06-12 – 2018-06-20 (×30): 125 mg via ORAL
  Filled 2018-06-12 (×36): qty 2.5

## 2018-06-12 MED ORDER — CALCITRIOL 0.25 MCG PO CAPS
0.5000 ug | ORAL_CAPSULE | ORAL | Status: DC
  Administered 2018-06-13 – 2018-06-20 (×4): 0.5 ug via ORAL
  Filled 2018-06-12: qty 2

## 2018-06-12 MED ORDER — MUPIROCIN 2 % EX OINT
TOPICAL_OINTMENT | Freq: Two times a day (BID) | CUTANEOUS | Status: DC
  Administered 2018-06-12 – 2018-06-18 (×12): via NASAL
  Administered 2018-06-19: 1 via NASAL
  Administered 2018-06-19 – 2018-06-20 (×2): via NASAL
  Filled 2018-06-12 (×4): qty 22

## 2018-06-12 NOTE — Consult Note (Addendum)
[facility] Renal Consultation Note    Indication for Consultation:  Management of ESRD/hemodialysis; anemia, hypertension/volume and secondary hyperparathyroidism  HPI: Jose Sellers is a 58 y.o. 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  Past medical history significant for Type 2 DM, HTN, CAD (s/p CABG 2012), ICM (EF 30-35%, s/p ICM 2012) GERD, thrombocytopenia, PAD, hyperlipidemia, OSA on c-pap, and COPD.  Admitted with left groin wound s/p left femoral endarterectomy with patch angioplasty and left femoral to above-the-knee popliteal bypass with PTFE graft by Dr.Early on 05/04/2018. VVS has been following. Presented to ED on 3/13 with seroma of left medial thigh that had opened. Pt was scheduled to f/u as outpatient. Returned to ED with worsening pain and swelling. Empiric vanc started for wound infection. Blood cultures collected.   Asked to see for routine dialysis. Due for HD tomorrow. No reported issues. Have been using R AVF with varying success at outpatient center. Did have successful cannulation on Monday. Still has TDC in place.    Past Medical History:  Diagnosis Date  . A-fib ([place])   . AICD (automatic cardioverter/defibrillator) present   . Anal fissure   . Anxiety   . Asthma   . CAD (coronary artery disease)   . CHF (congestive heart failure) ([place])   . COPD (chronic obstructive pulmonary disease) ([place])   . Degeneration of lumbar or lumbosacral intervertebral disc   . DM (diabetes mellitus), type 2, uncontrolled w/neurologic complication ([place])   . Dyspnea    with exertion  . Dysthymic disorder   . End stage renal disease on dialysis [facility])    dialysis M-W-F   [address] in [place]  . Essential (primary) hypertension   . GERD (gastroesophageal reflux disease)   . History of hiatal hernia   . Hx of colonic polyps   . Hyperlipemia   . Myocardial infarction ([place])   . Peripheral vascular disease ([place])    . Pneumonia   . Recurrent major depression ([place])   . Sleep apnea   . Ventral hernia without obstruction or gangrene   . Wears dentures    Past Surgical History:  Procedure Laterality Date  . ABDOMINAL AORTOGRAM W/LOWER EXTREMITY N/A 04/20/2018   Procedure: ABDOMINAL AORTOGRAM W/LOWER EXTREMITY;  Surgeon: Elam Dutch, MD;  Location: [facility];  Service: Cardiovascular;  Laterality: N/A;  . AV FISTULA PLACEMENT Right 02/13/2018   Procedure: CREATION OF RADIOCEPHALIC ARTERIOVENOUS FISTULA RIGHT ARM;  Surgeon: Elam Dutch, MD;  Location: [place];  Service: Vascular;  Laterality: Right;  . CAD s/p cabbage    . CARDIAC DEFIBRILLATOR PLACEMENT  2012  . CATARACT EXTRACTION W/ INTRAOCULAR LENS IMPLANT    . COLONOSCOPY  12/27/2013   Colonic polyps status post polypectomy.  Mild sigmoid diverticulosis. Internal hemorrhoids. Tubular adenoma.   . CORONARY ANGIOPLASTY    . CORONARY STENT PLACEMENT    . ENDARTERECTOMY Left 05/04/2018   Procedure: LEFT FEMORAL ENDARTERECTOMY;  Surgeon: [name], MD;  Location: [facility] OR;  Service: Vascular;  Laterality: Left;  . ESOPHAGOGASTRODUODENOSCOPY  12/01/2016   Mild gastritis.   . FEMORAL-POPLITEAL BYPASS GRAFT Left 05/04/2018   Procedure: LEFT FEMORAL TO ABOVE KNEE POPLITEAL BYPASS GRAFT USING 6MM X 80CM GORE PROPATEN RINGED GRAFT;  Surgeon: [name], MD;  Location: [facility] OR;  Service: Vascular;  Laterality: Left;  . heart bypass    . HERNIA REPAIR    . KNEE SURGERY    .  PATCH ANGIOPLASTY Left 05/04/2018   Procedure: Left Common Femoral/Profunda Patch Angioplasty using Hemashield Platinum Finesse Patch;  Surgeon: [name], MD;  Location: [facility] OR;  Service: Vascular;  Laterality: Left;   Family History  Problem Relation Age of Onset  . Colon cancer Neg Hx    Social History:  reports that he has been smoking cigarettes. He has a 8.75 pack-year smoking history. He has never used smokeless tobacco. He reports previous alcohol use. He  reports that he does not use drugs. Allergies  Allergen Reactions  . Albuterol Palpitations    SVT  . Prednisone Shortness Of Breath    Wife stated that patient develops breathing issues.  . Fluticasone Furoate-Vilanterol Palpitations  . Morphine Rash   Prior to Admission medications   Medication Sig Start Date End Date Taking? Authorizing Provider  amiodarone (PACERONE) 200 MG tablet Take 200 mg by mouth 2 (two) times daily.    Yes [provider]  aspirin 81 MG tablet Take 81 mg by mouth daily.  01/20/11  Yes [provider]  atorvastatin (LIPITOR) 40 MG tablet Take 40 mg by mouth 2 (two) times daily.    Yes [provider]  B Complex-C-Folic Acid (DIALYVITE 413) 0.8 MG TABS Take 1 tablet by mouth daily.  12/19/17  Yes [provider]  calcitRIOL (ROCALTROL) 0.25 MCG capsule Take 0.25 mcg by mouth every Monday, Wednesday, and Friday with hemodialysis.    Yes [provider]  esomeprazole (NEXIUM) 40 MG capsule Take 40 mg by mouth daily before breakfast.    Yes [provider]  ferric citrate (AURYXIA) 1 GM 210 MG(Fe) tablet Take 420 mg by mouth 3 (three) times daily with meals.    Yes [provider]  FLUoxetine (PROZAC) 20 MG capsule Take 20 mg by mouth daily.  11/07/17  Yes [provider]  furosemide (LASIX) 40 MG tablet Take 40 mg by mouth See admin instructions. Non dialysis days. Do not take on Monday, Wednesday and Friday   Yes [provider]  gabapentin (NEURONTIN) 300 MG capsule Take 1 capsule (300 mg total) by mouth 3 (three) times daily. Patient taking differently: Take 300 mg by mouth at bedtime.  03/14/18  Yes Stover, Titorya, DPM  Insulin Glargine (LANTUS SOLOSTAR) 100 UNIT/ML Solostar Pen Inject 8-12 Units into the skin at bedtime as needed (blood sugar).    Yes [provider]  insulin lispro (HUMALOG KWIKPEN) 100 UNIT/ML KwikPen Inject 0-32 Units into the skin 3 (three) times daily.  Sliding Scale Insulin    Yes [provider]  levalbuterol (XOPENEX) 0.63 MG/3ML nebulizer solution Inhale 0.63 mg into the lungs 2 (two) times daily.  10/09/17  Yes [provider]  lidocaine-prilocaine (EMLA) cream Apply 1 application topically See admin instructions. Apply small amount to access site 1-2 hours prior to dialysis. 01/29/18  Yes [provider]  metoprolol tartrate (LOPRESSOR) 25 MG tablet Take 12.5 mg by mouth 2 (two) times daily.  03/10/18  Yes [provider]  nitroGLYCERIN (NITROSTAT) 0.4 MG SL tablet Place 0.4 mg under the tongue every 5 (five) minutes as needed for chest pain.   Yes [provider]  oxycodone (ROXICODONE) 30 MG immediate release tablet Take 30 mg by mouth every 8 (eight) hours as needed for pain.    Yes [provider]  oxyCODONE-acetaminophen (PERCOCET) 7.5-325 MG tablet Take 1 tablet by mouth every 6 (six) hours as needed for severe pain. 05/06/18  Yes Dagoberto Ligas, PA-C  SANTYL ointment Apply 1 application topically daily. 05/29/18  Yes Early, Arvilla Meres, MD  tiotropium (SPIRIVA) 18 MCG inhalation capsule Place 18 mcg into inhaler and inhale daily.  03/10/18  Yes [provider]   Current Facility-Administered Medications  Medication Dose Route Frequency Provider Last Rate Last Dose  . acetaminophen (TYLENOL) tablet 650 mg  650 mg Oral Q6H PRN Ivor Costa, MD       Or  . acetaminophen (TYLENOL) suppository 650 mg  650 mg Rectal Q6H PRN Ivor Costa, MD      . amiodarone (PACERONE) tablet 200 mg  200 mg Oral BID Ivor Costa, MD   200 mg at 06/12/18 0929  . aspirin EC tablet 81 mg  81 mg Oral Daily Ivor Costa, MD   81 mg at 06/12/18 4098  . atorvastatin (LIPITOR) tablet 40 mg  40 mg Oral BID Ivor Costa, MD   40 mg at 06/12/18 1191  . [START ON 06/13/2018] calcitRIOL (ROCALTROL) capsule 0.25 mcg  0.25 mcg Oral Q M,W,F-HD Ivor Costa, MD      . collagenase (SANTYL) ointment 1 application  1 application  Topical Daily Ivor Costa, MD   1 application at 47/82/95 0932  . ferric citrate (AURYXIA) tablet 420 mg  420 mg Oral TID WC Ivor Costa, MD   420 mg at 06/12/18 1139  . FLUoxetine (PROZAC) capsule 20 mg  20 mg Oral Daily Ivor Costa, MD   20 mg at 06/12/18 6213  . furosemide (LASIX) tablet 40 mg  40 mg Oral Once per day on Sun Tue Thu Sat Ivor Costa, MD   40 mg at 06/12/18 0865  . gabapentin (NEURONTIN) capsule 300 mg  300 mg Oral QHS Ivor Costa, MD   300 mg at 06/11/18 2300  . hydrALAZINE (APRESOLINE) injection 5 mg  5 mg Intravenous Q2H PRN Ivor Costa, MD      . insulin aspart (novoLOG) injection 0-5 Units  0-5 Units Subcutaneous QHS Ivor Costa, MD      . insulin aspart (novoLOG) injection 0-9 Units  0-9 Units Subcutaneous TID WC Ivor Costa, MD      . ipratropium (ATROVENT) nebulizer solution 0.5 mg  0.5 mg Nebulization Q6H PRN Ivor Costa, MD      . levalbuterol Penne Lash) nebulizer solution 0.63 mg  0.63 mg Inhalation BID Ivor Costa, MD   0.63 mg at 06/12/18 0920  . [START ON 06/13/2018] lidocaine-prilocaine (EMLA) cream 1 application  1 application Topical Q M,W,F-HD Ivor Costa, MD      . metoprolol tartrate (LOPRESSOR) tablet 12.5 mg  12.5 mg Oral BID Ivor Costa, MD   12.5 mg at 06/12/18 0929  . multivitamin (RENA-VIT) tablet 1 tablet  1 tablet Oral QHS Ivor Costa, MD   1 tablet at 06/11/18 2300  . mupirocin ointment (BACTROBAN) 2 %   Nasal BID Vann, Jessica U, DO      . nicotine (NICODERM CQ - dosed in mg/24 hours) patch 21 mg  21 mg Transdermal Daily Ivor Costa, MD   21 mg at 06/12/18 0931  . nitroGLYCERIN (NITROSTAT) SL tablet 0.4 mg  0.4 mg Sublingual Q5 min PRN Ivor Costa, MD      . ondansetron [facility]) tablet 4 mg  4 mg Oral Q6H PRN Ivor Costa, MD       Or  . ondansetron (ZOFRAN) injection 4 mg  4 mg Intravenous Q6H PRN Ivor Costa, MD      . oxyCODONE-acetaminophen (PERCOCET/ROXICET) 5-325 MG per tablet 1 tablet  1  tablet Oral Q6H PRN Skeet Simmer, [facility]   1 tablet at 06/12/18 0160   And   . oxyCODONE (Oxy IR/ROXICODONE) immediate release tablet 2.5 mg  2.5 mg Oral Q6H PRN Skeet Simmer, RPH   2.5 mg at 06/12/18 1093  . pantoprazole (PROTONIX) EC tablet 40 mg  40 mg Oral Daily Ivor Costa, MD   40 mg at 06/12/18 0929  . senna-docusate (Senokot-S) tablet 1 tablet  1 tablet Oral QHS PRN Ivor Costa, MD      . umeclidinium bromide (INCRUSE ELLIPTA) 62.5 MCG/INH 1 puff  1 puff Inhalation Daily Skeet Simmer, RPH   1 puff at 06/12/18 0919  . vancomycin (VANCOCIN) 50 mg/mL oral solution 125 mg  125 mg Oral Q6H Vann, Jessica U, DO   125 mg at 06/12/18 1457  . vancomycin variable dose per unstable renal function (pharmacist dosing)   Does not apply See admin instructions Corinda Gubler, RPH         ROS: As per HPI otherwise negative.  Physical Exam: Vitals:   06/12/18 0619 06/12/18 0916 06/12/18 0920 06/12/18 0923  BP: 140/68 (!) 142/77    Pulse: 72 68    Resp: 19 20    Temp: 98 F (36.7 C) 98 F (36.7 C)    TempSrc: Oral Oral    SpO2: 98% 93% 92% 91%  Weight:      Height:         General: Chronically ill appearing male NAD Head: NCAT sclera not icteric  Neck: Supple. No JVD  Lungs: Barrel chest Decreased air movement, occasional wheeze  Heart: RRR with S1 S2 Abdomen: soft NT + BS Lower extremities:without edema or ischemic changes, no open wounds  1+ pitting edema bilat LE's Neuro: A & O  X 3. Moves all extremities spontaneously. Psych:  Responds to questions appropriately with a normal affect. Dialysis Access: R forearm AVF +bruit; R IJ [facility]   Labs: Basic Metabolic Panel: Recent Labs  Lab 06/08/18 0023 06/11/18 1633 06/12/18 0536  NA 135 133* 134*  K 4.3 4.2 3.7  CL 96* 91* 92*  CO2 28 28 30   GLUCOSE 156* 124* 108*  BUN 40* 27* 31*  CREATININE 5.46* 4.22* 4.63*  CALCIUM 9.6 9.4 9.4   Liver Function Tests: Recent Labs  Lab 06/08/18 0023 06/11/18 1633  AST 21 30  ALT 18 17  ALKPHOS 125 155*  BILITOT 1.4* 1.8*  PROT 6.6 6.9  ALBUMIN 2.9*  2.8*   No results for input(s): LIPASE, AMYLASE in the last 168 hours. No results for input(s): AMMONIA in the last 168 hours. CBC: Recent Labs  Lab 06/08/18 0023 06/11/18 1633 06/12/18 0536  WBC 5.3 8.3 9.0  NEUTROABS 3.8 6.1  --   HGB 11.1* 12.0* 10.7*  HCT 36.3* 40.5 34.7*  MCV 84.8 83.9 83.2  PLT 120* 104* 101*   Cardiac Enzymes: No results for input(s): CKTOTAL, CKMB, CKMBINDEX, TROPONINI in the last 168 hours. CBG: Recent Labs  Lab 06/11/18 2043 06/12/18 0746 06/12/18 1122  GLUCAP 141* 92 98   Iron Studies: No results for input(s): IRON, TIBC, TRANSFERRIN, FERRITIN in the last 72 hours. Studies/Results: Vas [place] Groin Pseudoaneurysm  Result Date: 06/11/2018  ARTERIAL PSEUDOANEURYSM  Exam: Left groin lump History: Left femoral to above knee pop bypass graft, Left femoral endarterectomy 05/04/2018. Performing Technologist: Alvia Grove RVT  Examination Guidelines: A complete evaluation includes B-mode imaging, spectral Doppler, color Doppler, and power Doppler as needed of all accessible portions of  each vessel. Bilateral testing is considered an integral part of a complete examination. Limited examinations for reoccurring indications may be performed as noted. +------------+----------+----------+------+----------+ Right DuplexPSV (cm/s) Waveform PlaqueComment(s) +------------+----------+----------+------+----------+ CFA            113    monophasic        brisk    +------------+----------+----------+------+----------+ +-----------+----------+--------+------+----------+ Left DuplexPSV (cm/s)WaveformPlaqueComment(s) +-----------+----------+--------+------+----------+ CFA            39    biphasic        graft    +-----------+----------+--------+------+----------+ Left Vein comments:  Summary: Multilobular fluid collection in the left groin at site of lump with the largest measurement of 3.38 x 3.64 with no color flow visualized., No evidence of DVT in the CFV.   Diagnosing physician: Harold Barban MD Electronically signed by Harold Barban MD on 06/11/2018 at 4:00:47 PM.   --------------------------------------------------------------------------------    Final     Dialysis Orders:  [place] MWF 4h 350/1.5x EDW 78kg 2K/2.25Ca UFP 2 Using R AVF (still has TDC) No heparin  Venofer 100 mg IV x 10 (until 3/23) Calcitriol 0.5 mcg PO TIW   Assessment/Plan: 1. L groin wound erythema/ swelling - possible infection , low -grade temp. BCx's stent, started on IV antibiotics per primary. Blood cultures pending  2. ESRD -  MWF. Next HD 3/18 on schedule  3. Hypertension/volume  - BP acceptable. UF to EDW as tolerated  4. Anemia  - Hgb 10.7. Continue Fe bolus  5. Metabolic bone disease -  Continue VDRA/binders 6. Nutrition - Prot supp for low albumin  7. PAD - s/p intervention as above   Lynnda Child PA-C [place] Pager [phone] 06/12/2018, 3:43 PM   Pt seen, examined and agree w assess/plan as above with additions as indicated.  [facility] 06/12/2018, 4:45 PM

## 2018-06-12 NOTE — Progress Notes (Signed)
Physical Therapy Evaluation Patient Details Name: OZZY BOHLKEN MRN: [id number] DOB: [birth year] Today's Date: 06/12/2018   History of Present Illness  Patient is 58 y/o male presenting to hospital with left groin wound infection. Patient recently s/p left femoral endarterectomy with patch angioplasty and left femoral to above-the-knee popliteal bypass with PTFE graft on 05/04/18. PMH includes CAD, CHF, COPD, ESRD on HD MWF, GERD, HTN, DMII, AICD, PAD, afib.   Clinical Impression  Patient admitted to hospital secondary to problems above and with deficits below. Patient required min guard to stand and ambulate with use of RW. Educated about use of RW at all times at home following d/c. Given functional mobility deficits, recommending HHPT following d/c. Wife reports being able to provide necessary assistance at home. Patient will benefit from acute physical therapy to maximize independence and safety with functional mobility.    Follow Up Recommendations Home health PT;Supervision/Assistance - 24 hour    Equipment Recommendations  3in1 (PT)    Recommendations for Other Services       Precautions / Restrictions Precautions Precautions: Fall Precaution Comments: reports 2 falls in last month Restrictions Weight Bearing Restrictions: No      Mobility  Bed Mobility Overal bed mobility: Needs Assistance Bed Mobility: Supine to Sit     Supine to sit: Supervision     General bed mobility comments: Patient required supervision for bed mobility for safety  Transfers Overall transfer level: Needs assistance Equipment used: Rolling walker (2 wheeled) Transfers: Sit to/from Stand Sit to Stand: Min guard         General transfer comment: Patient required min guard to stand with use of RW for safety. Denies dizziness in standing  Ambulation/Gait Ambulation/Gait assistance: Min guard Gait Distance (Feet): 100 Feet Assistive device: Rolling walker (2 wheeled) Gait  Pattern/deviations: Step-to pattern;Decreased step length - right;Decreased stance time - left;Decreased stride length;Decreased weight shift to left;Antalgic Gait velocity: decreased Gait velocity interpretation: <1.8 ft/sec, indicate of risk for recurrent falls General Gait Details: Patient ambulated with min guard and use of RW for safety. Patient with heavy reliance on BUE initally. Verbal cues for safe proximity and sequencing with RW. Educated about using RW at home for all mobility.   Stairs            Wheelchair Mobility    Modified Rankin (Stroke Patients Only)       Balance Overall balance assessment: Needs assistance Sitting-balance support: Feet supported;No upper extremity supported Sitting balance-Leahy Scale: Good     Standing balance support: Bilateral upper extremity supported Standing balance-Leahy Scale: Poor Standing balance comment: reliant on BUE support to maintain standing balance                             Pertinent Vitals/Pain Pain Assessment: Faces Faces Pain Scale: Hurts a little bit Pain Location: LLE Pain Descriptors / Indicators: Discomfort;Guarding Pain Intervention(s): Limited activity within patient's tolerance;Monitored during session    Home Living Family/patient expects to be discharged to:: Private residence Living Arrangements: Spouse/significant other Available Help at Discharge: Family;Available 24 hours/day Type of Home: House Home Access: Stairs to enter Entrance Stairs-Rails: Left;Right;Can reach both Entrance Stairs-Number of Steps: 10 Home Layout: One level Home Equipment: Walker - 2 wheels;Cane - single point;Grab bars - tub/shower;Shower seat      Prior Function Level of Independence: Independent with assistive device(s)         Comments: Used a cane or RW for mobility  Hand Dominance        Extremity/Trunk Assessment   Upper Extremity Assessment Upper Extremity Assessment: Defer to OT  evaluation    Lower Extremity Assessment Lower Extremity Assessment: Overall WFL for tasks assessed;LLE deficits/detail LLE Deficits / Details: LLE wound infection    Cervical / Trunk Assessment Cervical / Trunk Assessment: Normal  Communication   Communication: No difficulties  Cognition Arousal/Alertness: Awake/alert Behavior During Therapy: WFL for tasks assessed/performed Overall Cognitive Status: Within Functional Limits for tasks assessed                                        General Comments General comments (skin integrity, edema, etc.): Educated about stair navigation sequencing at home following d/c. Wife reports necessary level of assistance at home.    Exercises     Assessment/Plan    PT Assessment Patient needs continued PT services  PT Problem List Decreased strength;Decreased range of motion;Decreased activity tolerance;Decreased balance;Decreased mobility;Decreased knowledge of use of DME       PT Treatment Interventions DME instruction;Gait training;Stair training;Functional mobility training;Therapeutic activities;Therapeutic exercise;Balance training;Patient/family education    PT Goals (Current goals can be found in the Care Plan section)  Acute Rehab PT Goals Patient Stated Goal: "get back to fishing" PT Goal Formulation: With patient/family Time For Goal Achievement: 06/26/18 Potential to Achieve Goals: Good    Frequency Min 3X/week   Barriers to discharge        Co-evaluation               AM-PAC PT "6 Clicks" Mobility  Outcome Measure Help needed turning from your back to your side while in a flat bed without using bedrails?: None Help needed moving from lying on your back to sitting on the side of a flat bed without using bedrails?: None Help needed moving to and from a bed to a chair (including a wheelchair)?: A Little Help needed standing up from a chair using your arms (e.g., wheelchair or bedside chair)?: A  Little Help needed to walk in hospital room?: A Little Help needed climbing 3-5 steps with a railing? : A Lot 6 Click Score: 19    End of Session Equipment Utilized During Treatment: Gait belt Activity Tolerance: Patient tolerated treatment well Patient left: in chair;with call bell/phone within reach;with chair alarm set;with family/visitor present Nurse Communication: Mobility status PT Visit Diagnosis: Other abnormalities of gait and mobility (R26.89);Muscle weakness (generalized) (M62.81);Difficulty in walking, not elsewhere classified (R26.2);History of falling (Z91.81)    Time: 1751-1810 PT Time Calculation (min) (ACUTE ONLY): 19 min   Charges:   PT Evaluation $PT Eval Moderate Complexity: 1 Mod          Erick Blinks, SPT  Erick Blinks 06/12/2018, 6:31 PM

## 2018-06-12 NOTE — Consult Note (Addendum)
Hospital Consult    Reason for Consult:  Surgical wound dehiscence Requesting Physician:  Dr. Blaine Hamper MRN #:  [id number]  History of Present Illness: This is a 58 y.o. male with past medical history significant for CAD, CHF with EF of 25%, COPD, ESRD on hemodialysis, atrial fibrillation not anticoagulated, and PAD.  He has been seen in consultation for evaluation of surgical wound dehiscence.  He is status post left common femoral endarterectomy and patch angioplasty with Dacron as well as femoral to above-the-knee popliteal bypass with PTFE graft by Dr. Donnetta Hutching on 05/04/2018.  Work-up has included wound culture.  He has been started on broad-spectrum IV antibiotics.  Infectious disease has been consulted.  Patient has also struggled with C. difficile after recent hospitalization.  He denies any rest pain left foot.  He states left great toe ulceration is relatively unchanged.  He is on aspirin and statin daily.  He is an every day smoker trying to quit.  Past Medical History:  Diagnosis Date   A-fib [facility])    AICD (automatic cardioverter/defibrillator) present    Anal fissure    Anxiety    Asthma    CAD (coronary artery disease)    CHF (congestive heart failure) (HCC)    COPD (chronic obstructive pulmonary disease) (HCC)    Degeneration of lumbar or lumbosacral intervertebral disc    DM (diabetes mellitus), type 2, uncontrolled w/neurologic complication (HCC)    Dyspnea    with exertion   Dysthymic disorder    End stage renal disease on dialysis [facility])    dialysis M-W-F   [employer] in [place] (primary) hypertension    GERD (gastroesophageal reflux disease)    History of hiatal hernia    Hx of colonic polyps    Hyperlipemia    Myocardial infarction ([place])    Peripheral vascular disease (HCC)    Pneumonia    Recurrent major depression (HCC)    Sleep apnea    Ventral hernia without obstruction or gangrene    Wears dentures     Past Surgical  History:  Procedure Laterality Date   ABDOMINAL AORTOGRAM W/LOWER EXTREMITY N/A 04/20/2018   Procedure: ABDOMINAL AORTOGRAM W/LOWER EXTREMITY;  Surgeon: Elam Dutch, MD;  Location: [place] CV LAB;  Service: Cardiovascular;  Laterality: N/A;   AV FISTULA PLACEMENT Right 02/13/2018   Procedure: CREATION OF RADIOCEPHALIC ARTERIOVENOUS FISTULA RIGHT ARM;  Surgeon: Elam Dutch, MD;  Location: [facility] OR;  Service: Vascular;  Laterality: Right;   CAD s/p cabbage     CARDIAC DEFIBRILLATOR PLACEMENT  2012   CATARACT EXTRACTION W/ INTRAOCULAR LENS IMPLANT     COLONOSCOPY  12/27/2013   Colonic polyps status post polypectomy.  Mild sigmoid diverticulosis. Internal hemorrhoids. Tubular adenoma.    CORONARY ANGIOPLASTY     CORONARY STENT PLACEMENT     ENDARTERECTOMY Left 05/04/2018   Procedure: LEFT FEMORAL ENDARTERECTOMY;  Surgeon: [name], MD;  Location: [facility] OR;  Service: Vascular;  Laterality: Left;   ESOPHAGOGASTRODUODENOSCOPY  12/01/2016   Mild gastritis.    FEMORAL-POPLITEAL BYPASS GRAFT Left 05/04/2018   Procedure: LEFT FEMORAL TO ABOVE KNEE POPLITEAL BYPASS GRAFT USING 6MM X 80CM GORE PROPATEN RINGED GRAFT;  Surgeon: [name], MD;  Location: [facility] OR;  Service: Vascular;  Laterality: Left;   heart bypass     HERNIA REPAIR     KNEE SURGERY     PATCH ANGIOPLASTY Left 05/04/2018   Procedure: Left Common Femoral/Profunda Patch Angioplasty using Hemashield Platinum  Finesse Patch;  Surgeon: [name], MD;  Location: [place];  Service: Vascular;  Laterality: Left;    Allergies  Allergen Reactions   Albuterol Palpitations    SVT   Prednisone Shortness Of Breath    Wife stated that patient develops breathing issues.   Fluticasone Furoate-Vilanterol Palpitations   Morphine Rash    Prior to Admission medications   Medication Sig Start Date End Date Taking? Authorizing Provider  amiodarone (PACERONE) 200 MG tablet Take 200 mg by mouth 2 (two) times daily.    Yes  [provider]  aspirin 81 MG tablet Take 81 mg by mouth daily.  01/20/11  Yes [provider]  atorvastatin (LIPITOR) 40 MG tablet Take 40 mg by mouth 2 (two) times daily.    Yes [provider]  B Complex-C-Folic Acid (DIALYVITE 498) 0.8 MG TABS Take 1 tablet by mouth daily.  12/19/17  Yes [provider]  calcitRIOL (ROCALTROL) 0.25 MCG capsule Take 0.25 mcg by mouth every Monday, Wednesday, and Friday with hemodialysis.    Yes [provider]  esomeprazole (NEXIUM) 40 MG capsule Take 40 mg by mouth daily before breakfast.    Yes [provider]  ferric citrate (AURYXIA) 1 GM 210 MG(Fe) tablet Take 420 mg by mouth 3 (three) times daily with meals.    Yes [provider]  FLUoxetine (PROZAC) 20 MG capsule Take 20 mg by mouth daily.  11/07/17  Yes [provider]  furosemide (LASIX) 40 MG tablet Take 40 mg by mouth See admin instructions. Non dialysis days. Do not take on Monday, Wednesday and Friday   Yes [provider]  gabapentin (NEURONTIN) 300 MG capsule Take 1 capsule (300 mg total) by mouth 3 (three) times daily. Patient taking differently: Take 300 mg by mouth at bedtime.  03/14/18  Yes Stover, Titorya, DPM  Insulin Glargine (LANTUS SOLOSTAR) 100 UNIT/ML Solostar Pen Inject 8-12 Units into the skin at bedtime as needed (blood sugar).    Yes [provider]  insulin lispro (HUMALOG KWIKPEN) 100 UNIT/ML KwikPen Inject 0-32 Units into the skin 3 (three) times daily. Sliding Scale Insulin    Yes [provider]  levalbuterol (XOPENEX) 0.63 MG/3ML nebulizer solution Inhale 0.63 mg into the lungs 2 (two) times daily.  10/09/17  Yes [provider]  lidocaine-prilocaine (EMLA) cream Apply 1 application topically See admin instructions. Apply small amount to access site 1-2 hours prior to dialysis. 01/29/18  Yes [provider]  metoprolol tartrate (LOPRESSOR) 25 MG tablet Take 12.5 mg  by mouth 2 (two) times daily.  03/10/18  Yes [provider]  nitroGLYCERIN (NITROSTAT) 0.4 MG SL tablet Place 0.4 mg under the tongue every 5 (five) minutes as needed for chest pain.   Yes [provider]  oxycodone (ROXICODONE) 30 MG immediate release tablet Take 30 mg by mouth every 8 (eight) hours as needed for pain.    Yes [provider]  oxyCODONE-acetaminophen (PERCOCET) 7.5-325 MG tablet Take 1 tablet by mouth every 6 (six) hours as needed for severe pain. 05/06/18  Yes Dagoberto Ligas, PA-C  SANTYL ointment Apply 1 application topically daily. 05/29/18  Yes Early, Arvilla Meres, MD  tiotropium (SPIRIVA) 18 MCG inhalation capsule Place 18 mcg into inhaler and inhale daily.  03/10/18  Yes [provider]    Social History   Socioeconomic History   Marital status: Married    Spouse name: Not on file   Number of children: Not on file  Years of education: Not on file   Highest education level: Not on file  Occupational History   Not on file  Social Needs   Financial resource strain: Not on file   Food insecurity:    Worry: Not on file    Inability: Not on file   Transportation needs:    Medical: Not on file    Non-medical: Not on file  Tobacco Use   Smoking status: Current Some Day Smoker    Packs/day: 0.25    Years: 35.00    Pack years: 8.75    Types: Cigarettes   Smokeless tobacco: Never Used  Substance and Sexual Activity   Alcohol use: Not Currently   Drug use: Never   Sexual activity: Not on file  Lifestyle   Physical activity:    Days per week: Not on file    Minutes per session: Not on file   Stress: Not on file  Relationships   Social connections:    Talks on phone: Not on file    Gets together: Not on file    Attends religious service: Not on file    Active member of club or organization: Not on file    Attends meetings of clubs or organizations: Not on file    Relationship status: Not on file   Intimate  partner violence:    Fear of current or ex partner: Not on file    Emotionally abused: Not on file    Physically abused: Not on file    Forced sexual activity: Not on file  Other Topics Concern   Not on file  Social History Narrative   Not on file     Family History  Problem Relation Age of Onset   Colon cancer Neg Hx     ROS: Otherwise negative unless mentioned in HPI  Physical Examination  Vitals:   06/12/18 0923 06/12/18 1623  BP:  137/83  Pulse:  65  Resp:  18  Temp:  (!) 97.4 F (36.3 C)  SpO2: 91% 93%   Body mass index is 24.22 kg/m.  General:  WDWN in NAD Gait: Not observed HENT: WNL, normocephalic Pulmonary: normal non-labored breathing, without Rales, rhonchi,  wheezing Cardiac: regular Abdomen:  soft, NT/ND, no masses Skin: without rashes Vascular Exam/Pulses: Palpable left anterior tibial artery Extremities: Left groin incision intact however palpable fluid collection with erythema; left popliteal incision open with purulent drainage seemingly superficial Musculoskeletal: no muscle wasting or atrophy  Neurologic: A&O X 3;  No focal weakness or paresthesias are detected; speech is fluent/normal Psychiatric:  The pt has Normal affect. Lymph:  Unremarkable  CBC    Component Value Date/Time   WBC 9.0 06/12/2018 0536   RBC 4.17 (L) 06/12/2018 0536   HGB 10.7 (L) 06/12/2018 0536   HCT 34.7 (L) 06/12/2018 0536   PLT 101 (L) 06/12/2018 0536   MCV 83.2 06/12/2018 0536   MCH 25.7 (L) 06/12/2018 0536   MCHC 30.8 06/12/2018 0536   RDW 17.2 (H) 06/12/2018 0536   LYMPHSABS 0.9 06/11/2018 1633   MONOABS 0.6 06/11/2018 1633   EOSABS 0.7 (H) 06/11/2018 1633   BASOSABS 0.0 06/11/2018 1633    BMET    Component Value Date/Time   NA 134 (L) 06/12/2018 0536   K 3.7 06/12/2018 0536   CL 92 (L) 06/12/2018 0536   CO2 30 06/12/2018 0536   GLUCOSE 108 (H) 06/12/2018 0536   BUN 31 (H) 06/12/2018 0536   CREATININE 4.63 (H) 06/12/2018 0536  CALCIUM 9.4  06/12/2018 0536   GFRNONAA 13 (L) 06/12/2018 0536   GFRAA 15 (L) 06/12/2018 0536    COAGS: Lab Results  Component Value Date   INR 1.23 05/01/2018   INR 1.08 02/13/2018      ASSESSMENT/PLAN: This is a 58 y.o. male status post left common femoral artery endarterectomy with Dacron patch angioplasty as well as femoral to above-the-knee popliteal bypass with PTFE graft about 6 weeks postop  Bypass graft is patent with a palpable left ATA pulse Continue IV antibiotics; wound culture pending Continue daily packing of above the knee popliteal incision End-stage renal disease on hemodialysis per nephrology No indication for surgical intervention today; okay to have diet Dr. Donnetta Hutching will evaluate the patient tomorrow   Dagoberto Ligas PA-C [facility] [phone]  I agree with the above.  I have seen and evaluated the patient.  He is recently status post left femoral to above-knee popliteal artery bypass graft with Gore-Tex.  He was admitted yesterday with a wound that had opened up around his above-knee incision as well as a hematoma in the left groin.  In the office the groin was imaged with ultrasound.  There was no evidence of pseudoaneurysm.  It was however erythematous.  The patient is now on IV antibiotics.  ID consultation has been recommended given the patient's propensity to develop C. difficile while on antibiotics.  I probed the above knee incision.  This does not tunnel down to the graft.  There is no exposed graft.  This was repacked with saline gauze.  The groin incision remains intact however there is a underlying hematoma and some thinning of the skin.  This will need to be monitored closely.  There is no indication for operative exploration at this time, however given the prosthetic graft, his incisions will need to be monitored closely.  Annamarie Major

## 2018-06-12 NOTE — Care Management Obs Status (Signed)
[place] NOTIFICATION   Patient Details  Name: Jose Sellers MRN: [id number] Date of Birth: 04-Feb-1961   Medicare Observation Status Notification Given:  Yes    Bartholomew Crews, RN 06/12/2018, 1:17 PM

## 2018-06-12 NOTE — TOC Initial Note (Signed)
Transition of Care [facility]) - Initial/Assessment Note    Patient Details  Name: Jose Sellers MRN: [id number] Date of Birth: 09-22-1960  Transition of [facility]) CM/SW Contact:    Bartholomew Crews, RN Phone Number: 06/12/2018, 1:17 PM  Clinical Narrative:                 Spoke with patient at bedside. Reports no active [place] services in place. Has a cane, but would like a wheelchair. States that he has fallen down about 3 times in the last 6 months. Discussed PT and OT to assess his needs-patient agreeable. Patient reports that he gets his [facility] in [place]. He states that his wife takes good care of him and provides transportation to medical appointments.   Upon return to the room patient was on the phone with his spouse, and asked CM to speak with her. Spouse expressed concerns about wound having been left open and wondered why wound specialist had not seen him. Discussed that wound nurse did see him this morning. She also added that the vascular doctor talked about sending a HHRN to help with monitoring the wound.   CM to follow for transition of care needs.   Expected Discharge Plan: [place] Barriers to Discharge: No Barriers Identified   Patient Goals and CMS Choice Patient states their goals for this hospitalization and ongoing recovery are:: to heal      Expected Discharge Plan and Services Expected Discharge Plan: [place] Discharge Planning Services: CM Consult   Living arrangements for the past 2 months: Single Family Home                          Prior Living Arrangements/Services Living arrangements for the past 2 months: Single Family Home Lives with:: Self, Spouse Patient language and need for interpreter reviewed:: No Do you feel safe going back to the place where you live?: Yes      Need for Family Participation in Patient Care: Yes (Comment) Care giver support system in place?: Yes (comment)    Criminal Activity/Legal Involvement Pertinent to Current Situation/Hospitalization: No - Comment as needed  Activities of Daily Living Home Assistive Devices/Equipment: CBG Meter, Cane (specify quad or straight), Walker (specify type) ADL Screening (condition at time of admission) Patient's cognitive ability adequate to safely complete daily activities?: Yes Is the patient deaf or have difficulty hearing?: No Does the patient have difficulty seeing, even when wearing glasses/contacts?: No Does the patient have difficulty concentrating, remembering, or making decisions?: No Patient able to express need for assistance with ADLs?: Yes Does the patient have difficulty dressing or bathing?: No Independently performs ADLs?: Yes (appropriate for developmental age) Does the patient have difficulty walking or climbing stairs?: Yes Weakness of Legs: Both Weakness of Arms/Hands: None  Permission Sought/Granted Permission sought to share information with : Family Supports(spouse, [place]) Permission granted to share information with : Yes, Verbal Permission Granted  Share Information with NAME: Jose Sellers     Permission granted to share info w Relationship: spouse  Permission granted to share info w Contact Information: [phone]  Emotional Assessment Appearance:: Appears older than stated age, Disheveled Attitude/Demeanor/Rapport: Engaged Affect (typically observed): Accepting, Happy, Appropriate Orientation: : Oriented to Self, Oriented to Place, Oriented to Situation, Oriented to  Time Alcohol / Substance Use: Not Applicable Psych Involvement: No (comment)  Admission diagnosis:  Cellulitis of left groin [L03.314] Patient Active Problem List  Diagnosis Date Noted  . Open wound of left thigh 06/11/2018  . Chronic atrial fibrillation 06/11/2018  . Cellulitis of left groin 06/11/2018  . Thrombocytopenia ([place]) 06/11/2018  . Wound infection 06/11/2018  . PAD (peripheral artery  disease) ([place]) 05/04/2018  . AICD (automatic cardioverter/defibrillator) present   . Typical atrial flutter ([place]) 10/16/2017  . Acute respiratory failure with hypoxia ([place]) 09/08/2017  . C. difficile diarrhea 09/08/2017  . Peritonitis associated with peritoneal dialysis ([place]) 08/16/2017  . At high risk for falls 07/27/2017  . Arteriosclerotic vascular disease 03/16/2017  . Chronic heart disease 03/16/2017  . ESRD on dialysis ([place]) 03/16/2017  . Hypercholesterolemia 03/16/2017  . Hypertensive disorder 03/16/2017  . Anemia of unknown etiology 07/07/2016  . Malaise and fatigue 07/07/2016  . Chronic pain syndrome 06/20/2016  . Secondary hyperparathyroidism ([place]) 12/16/2015  . Stage 5 chronic kidney disease ([place]) 12/16/2015  . Chronic systolic congestive heart failure ([place]) 11/25/2015  . Tobacco use 11/25/2015  . Ventral hernia without obstruction or gangrene 10/26/2015  . Psychosexual dysfunction with inhibited sexual excitement 08/27/2015  . Anxiety 08/26/2015  . COPD (chronic obstructive pulmonary disease) ([place]) 08/26/2015  . DDD (degenerative disc disease), lumbosacral 08/26/2015  . GERD (gastroesophageal reflux disease) 08/26/2015  . Hypothyroidism 08/26/2015  . Microalbuminuria 08/26/2015  . Mixed hyperlipidemia 08/26/2015  . Osteoarthritis of multiple joints 08/26/2015  . Peripheral vascular disease ([place]) 08/26/2015  . Abdominal hernia 06/16/2015  . CAD (coronary artery disease) 04/07/2015  . 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 OSA (obstructive sleep apnea) 04/07/2015  . Essential hypertension 03/06/2015  . Retinopathy, diabetic, bilateral ([place]) 03/06/2015  . Uncontrolled type 2 diabetes mellitus with diabetic polyneuropathy, with long-term current use of insulin ([place]) 03/06/2015  . Atherosclerosis with claudication of extremity ([place]) 08/01/2014  . Mild episode of recurrent major depressive disorder ([place]) 05/01/2013  . Panic disorder 05/01/2013    PCP:  Raina Mina., MD Pharmacy:   [place], [place] [address] Phone: [phone] Fax: [phone]     Social Determinants of Health (SDOH) Interventions    Readmission Risk Interventions 30 Day Unplanned Readmission Risk Score     Admission (Discharged) from 05/04/2018 in [facility] 4E CV SURGICAL PROGRESSIVE CARE  30 Day Unplanned Readmission Risk Score (%)  20 Filed at 05/06/2018 1200     This score is the patient's risk of an unplanned readmission within 30 days of being discharged (0 -100%). The score is based on dignosis, age, lab data, medications, orders, and past utilization.   Low:  0-14.9   Medium: 15-21.9   High: 22-29.9   Extreme: 30 and above       No flowsheet data found.

## 2018-06-12 NOTE — Progress Notes (Signed)
Patient is asking for PT/OT evaluation. Patient stated that he has fallen three times within the past six months. MD notified.

## 2018-06-12 NOTE — Progress Notes (Signed)
Infection Prevention called and stated that to conserve PPE resources patient does not require Contact Precautions for positive MRSA PCR at this time. Contact precautions discontinued.

## 2018-06-12 NOTE — Consult Note (Signed)
WOC consult requested for left groin, left thigh, left heel, and left great toe.  EMR indicates VVS team plans to assess wounds and provide further plan of care; please refer to their team for further questions. Please re-consult if further assistance is needed.  Thank-you,  Julien Girt MSN, [place], [place], [place], [place]

## 2018-06-12 NOTE — Progress Notes (Signed)
Progress Note    Jose Sellers  TFT:[id number] DOB: 12/12/1960  DOA: 06/11/2018 PCP: Raina Mina., MD    Brief Narrative:     Medical records reviewed and are as summarized below:  Jose Sellers is an 58 y.o. male with medical history significant of A. Fib not on AC, s/p AICD, asthma, CAD, sCHF with EF 25%, COPD, DM, ESRD-HD (MWF), PAD hyperlipidemia, chronic thrombocytopenia, who presents with left groin erythema and swelling, open wound in left medial groin-- sent in from the vascular office routine follow up.  Assessment/Plan:   Principal Problem:   Wound infection Active Problems:   CAD (coronary artery disease)   Chronic systolic congestive heart failure (HCC)   COPD (chronic obstructive pulmonary disease) (HCC)   ESRD on dialysis (HCC)   GERD (gastroesophageal reflux disease)   Hypercholesterolemia   Essential hypertension   Tobacco use   Uncontrolled type 2 diabetes mellitus with diabetic polyneuropathy, with long-term current use of insulin (HCC)   AICD (automatic cardioverter/defibrillator) present   PAD (peripheral artery disease) (HCC)   Open wound of left thigh   Chronic atrial fibrillation   Cellulitis of left groin   Thrombocytopenia (HCC)  Possible wound infection in left medial thigh and possible cellulitis of left groin:  - Empiric antimicrobial treatment with vancomycin - discussed with ID his recent c diff infection (June) and was recommended oral vancomaycin 125mg  QID for while on antibiotics and for 1 week after - PRN Zofran for nausea, percocet for pain - Blood cultures x 2  -f/u VVS recommendation  PAD: s/p of left femoral endarterectomy with patch angioplasty and left femoral to above-the-knee popliteal bypass with PTFE graft by Dr.Early on 05/04/2018. -f/u VVS recommendation  CAD (coronary artery disease): no CP.  -continue aspirin, Lipitor, metoprolol -As needed nitroglycerin  ESRD on HD (MWF): pt had had a dialysis today.   Potassium 4.2, bicarbonate 28, creatinine 4.22, BUN 27. -Dr. Jonnie Finner has been notified of hospitalization  Chronic systolic congestive heart failure ([place]): 2D echo on 10/31/2017 showed EF 25-30%.  Patient does not have leg edema or JVD.  CHF seems to be compensated. -Continue home dose Lasix -Volume management per renal by dialysis   GERD: -Protonix  COPD (chronic obstructive pulmonary disease) ([place]): Stable -Bronchodilators  Hypercholesterolemia: -lipitor  HTN:  -Continue home medications: Metoprolol, Lasix, -IV hydralazine prn  Tobacco abuse: -Nicotine patch  Uncontrolled type 2 diabetes mellitus with diabetic polyneuropathy, with long-term current use of insulin, with ESRD: Last A1c 6.0 on 05/01/18, well controled. Patient is taking Lantus as needed at home, did not take Lantus today.  Blood sugar 124. -SSI  H/o atrial tachycardia-- not a fib?:  -Continue metoprolol and amiodarone -eliquis was stopped in June 2019 by [facility]  Thrombocytopenia [facility]): This is a chronic issue.  Platelet 104.  Mental status normal.  No bleeding tendency. -f/u by CBC   PT/OT as he has been falling at home  Family Communication/Anticipated D/C date and plan/Code Status   DVT prophylaxis: Lovenox ordered. Code Status: Full Code.  Family Communication:  Disposition Plan:    Medical Consultants:    None.     Subjective:   hungry  Objective:    Vitals:   06/12/18 0619 06/12/18 0916 06/12/18 0920 06/12/18 0923  BP: 140/68 (!) 142/77    Pulse: 72 68    Resp: 19 20    Temp: 98 F (36.7 C) 98 F (36.7 C)    TempSrc: Oral Oral  SpO2: 98% 93% 92% 91%  Weight:      Height:        Intake/Output Summary (Last 24 hours) at 06/12/2018 1331 Last data filed at 06/12/2018 1140 Gross per 24 hour  Intake 360 ml  Output 0 ml  Net 360 ml   Filed Weights   06/11/18 1608  Weight: 81 kg    Exam: A+Ox3 Pleasant and cooperative Coarse breath sounds that cleared with  coughing, no wheezing Open wound on left groin, swollen and red (per vascular less red) Chronic skin changes b/l LE Chronic un-stagable ulcer on left heel and great toe  Data Reviewed:   I have personally reviewed following labs and imaging studies:  Labs: Labs show the following:   Basic Metabolic Panel: Recent Labs  Lab 06/08/18 0023 06/11/18 1633 06/12/18 0536  NA 135 133* 134*  K 4.3 4.2 3.7  CL 96* 91* 92*  CO2 28 28 30   GLUCOSE 156* 124* 108*  BUN 40* 27* 31*  CREATININE 5.46* 4.22* 4.63*  CALCIUM 9.6 9.4 9.4   GFR Estimated Creatinine Clearance: 19.6 mL/min (A) (by C-G formula based on SCr of 4.63 mg/dL (H)). Liver Function Tests: Recent Labs  Lab 06/08/18 0023 06/11/18 1633  AST 21 30  ALT 18 17  ALKPHOS 125 155*  BILITOT 1.4* 1.8*  PROT 6.6 6.9  ALBUMIN 2.9* 2.8*   No results for input(s): LIPASE, AMYLASE in the last 168 hours. No results for input(s): AMMONIA in the last 168 hours. Coagulation profile No results for input(s): INR, PROTIME in the last 168 hours.  CBC: Recent Labs  Lab 06/08/18 0023 06/11/18 1633 06/12/18 0536  WBC 5.3 8.3 9.0  NEUTROABS 3.8 6.1  --   HGB 11.1* 12.0* 10.7*  HCT 36.3* 40.5 34.7*  MCV 84.8 83.9 83.2  PLT 120* 104* 101*   Cardiac Enzymes: No results for input(s): CKTOTAL, CKMB, CKMBINDEX, TROPONINI in the last 168 hours. BNP (last 3 results) No results for input(s): PROBNP in the last 8760 hours. CBG: Recent Labs  Lab 06/11/18 2043 06/12/18 0746 06/12/18 1122  GLUCAP 141* 92 98   D-Dimer: No results for input(s): DDIMER in the last 72 hours. Hgb A1c: No results for input(s): HGBA1C in the last 72 hours. Lipid Profile: No results for input(s): CHOL, HDL, LDLCALC, TRIG, CHOLHDL, LDLDIRECT in the last 72 hours. Thyroid function studies: No results for input(s): TSH, T4TOTAL, T3FREE, THYROIDAB in the last 72 hours.  Invalid input(s): FREET3 Anemia work up: No results for input(s): VITAMINB12, FOLATE,  FERRITIN, TIBC, IRON, RETICCTPCT in the last 72 hours. Sepsis Labs: Recent Labs  Lab 06/08/18 0023 06/11/18 1633 06/11/18 2025 06/12/18 0536  PROCALCITON  --   --  0.65  --   WBC 5.3 8.3  --  9.0  LATICACIDVEN 1.3 1.4  --   --     Microbiology Recent Results (from the past 240 hour(s))  Blood culture (routine x 2)     Status: None (Preliminary result)   Collection Time: 06/11/18  4:52 PM  Result Value Ref Range Status   Specimen Description BLOOD LEFT HAND  Final   Special Requests   Final    BOTTLES DRAWN AEROBIC AND ANAEROBIC Blood Culture adequate volume   Culture   Final    NO GROWTH < 24 HOURS Performed at [facility], [place] [address], [place], [place] [address]    Report Status PENDING  Incomplete  Blood culture (routine x 2)     Status:  None (Preliminary result)   Collection Time: 06/11/18  5:01 PM  Result Value Ref Range Status   Specimen Description BLOOD LEFT WRIST  Final   Special Requests   Final    BOTTLES DRAWN AEROBIC AND ANAEROBIC Blood Culture results may not be optimal due to an inadequate volume of blood received in culture bottles   Culture   Final    NO GROWTH < 24 HOURS Performed at [facility] [address], [place], [place] [address]    Report Status PENDING  Incomplete  MRSA PCR Screening     Status: Abnormal   Collection Time: 06/12/18  7:52 AM  Result Value Ref Range Status   MRSA by PCR POSITIVE (A) NEGATIVE Final    Comment:        The GeneXpert MRSA Assay (FDA approved for NASAL specimens only), is one component of a comprehensive MRSA colonization surveillance program. It is not intended to diagnose MRSA infection nor to guide or monitor treatment for MRSA infections. RESULT CALLED TO, READ BACK BY AND VERIFIED WITH: Lurline Hare RN 9:45 06/12/18 ([email or web address]) Performed at [facility], [place] [address], [place], [place] [address]     Procedures and diagnostic studies:  Vas [place] Groin Pseudoaneurysm  Result Date:  06/11/2018  ARTERIAL PSEUDOANEURYSM  Exam: Left groin lump History: Left femoral to above knee pop bypass graft, Left femoral endarterectomy 05/04/2018. Performing Technologist: Alvia Grove RVT  Examination Guidelines: A complete evaluation includes B-mode imaging, spectral Doppler, color Doppler, and power Doppler as needed of all accessible portions of each vessel. Bilateral testing is considered an integral part of a complete examination. Limited examinations for reoccurring indications may be performed as noted. +------------+----------+----------+------+----------+ Right DuplexPSV (cm/s) Waveform PlaqueComment(s) +------------+----------+----------+------+----------+ CFA            113    monophasic        brisk    +------------+----------+----------+------+----------+ +-----------+----------+--------+------+----------+ Left DuplexPSV (cm/s)WaveformPlaqueComment(s) +-----------+----------+--------+------+----------+ CFA            39    biphasic        graft    +-----------+----------+--------+------+----------+ Left Vein comments:  Summary: Multilobular fluid collection in the left groin at site of lump with the largest measurement of 3.38 x 3.64 with no color flow visualized., No evidence of DVT in the CFV.  Diagnosing physician: Harold Barban MD Electronically signed by Harold Barban MD on 06/11/2018 at 4:00:47 PM.   --------------------------------------------------------------------------------    Final     Medications:   . amiodarone  200 mg Oral BID  . aspirin EC  81 mg Oral Daily  . atorvastatin  40 mg Oral BID  . [START ON 06/13/2018] calcitRIOL  0.25 mcg Oral Q M,W,F-HD  . collagenase  1 application Topical Daily  . ferric citrate  420 mg Oral TID WC  . FLUoxetine  20 mg Oral Daily  . furosemide  40 mg Oral Once per day on Sun Tue Thu Sat  . gabapentin  300 mg Oral QHS  . insulin aspart  0-5 Units Subcutaneous QHS  . insulin aspart  0-9 Units Subcutaneous TID WC  .  levalbuterol  0.63 mg Inhalation BID  . [START ON 06/13/2018] lidocaine-prilocaine  1 application Topical Q M,W,F-HD  . metoprolol tartrate  12.5 mg Oral BID  . multivitamin  1 tablet Oral QHS  . mupirocin ointment   Nasal BID  . nicotine  21 mg Transdermal Daily  . pantoprazole  40 mg Oral Daily  . saccharomyces boulardii  250 mg Oral BID  . umeclidinium bromide  1 puff Inhalation Daily  . vancomycin variable dose per unstable renal function (pharmacist dosing)   Does not apply See admin instructions   Continuous Infusions:   LOS: 0 days   Geradine Girt  [facility]   How to contact the [facility] Attending or Consulting provider [place] or covering provider during after hours [place], for this patient?  1. Check the care team in [facility] and look for a) attending/consulting [facility] provider listed and b) the [facility] team listed 2. Log into [email or web address] and use [place]'s universal password to access. If you do not have the password, please contact the hospital operator. 3. Locate the [facility] provider you are looking for under [facility] and page to a number that you can be directly reached. 4. If you still have difficulty reaching the provider, please page the [facility] (Director on Call) for the Hospitalists listed on amion for assistance.  06/12/2018, 1:31 PM

## 2018-06-13 LAB — RENAL FUNCTION PANEL
Albumin: 2.4 g/dL — ABNORMAL LOW (ref 3.5–5.0)
Anion gap: 14 (ref 5–15)
BUN: 41 mg/dL — ABNORMAL HIGH (ref 6–20)
CO2: 26 mmol/L (ref 22–32)
Calcium: 9 mg/dL (ref 8.9–10.3)
Chloride: 92 mmol/L — ABNORMAL LOW (ref 98–111)
Creatinine, Ser: 5.9 mg/dL — ABNORMAL HIGH (ref 0.61–1.24)
GFR calc Af Amer: 11 mL/min — ABNORMAL LOW (ref >60)
GFR calc non Af Amer: 10 mL/min — ABNORMAL LOW (ref >60)
Glucose, Bld: 113 mg/dL — ABNORMAL HIGH (ref 70–99)
Phosphorus: 3.7 mg/dL (ref 2.5–4.6)
Potassium: 3.9 mmol/L (ref 3.5–5.1)
Sodium: 132 mmol/L — ABNORMAL LOW (ref 135–145)

## 2018-06-13 LAB — CBC
HCT: 44.1 % (ref 39.0–52.0)
Hemoglobin: 13.4 g/dL (ref 13.0–17.0)
MCH: 24.9 pg — AB (ref 26.0–34.0)
MCHC: 30.4 g/dL (ref 30.0–36.0)
MCV: 81.8 fL (ref 80.0–100.0)
Platelets: 92 10*3/uL — ABNORMAL LOW (ref 150–400)
RBC: 5.39 MIL/uL (ref 4.22–5.81)
RDW: 17.4 % — ABNORMAL HIGH (ref 11.5–15.5)
WBC: 7.7 10*3/uL (ref 4.0–10.5)
nRBC: 0 % (ref 0.0–0.2)

## 2018-06-13 LAB — GLUCOSE, CAPILLARY
GLUCOSE-CAPILLARY: 103 mg/dL — AB (ref 70–99)
Glucose-Capillary: 134 mg/dL — ABNORMAL HIGH (ref 70–99)
Glucose-Capillary: 104 mg/dL — ABNORMAL HIGH (ref 70–99)
Glucose-Capillary: 170 mg/dL — ABNORMAL HIGH (ref 70–99)

## 2018-06-13 MED ORDER — VANCOMYCIN HCL IN DEXTROSE 1-5 GM/200ML-% IV SOLN
1000.0000 mg | Freq: Once | INTRAVENOUS | Status: AC
  Administered 2018-06-13: 1000 mg via INTRAVENOUS

## 2018-06-13 MED ORDER — CALCITRIOL 0.5 MCG PO CAPS
ORAL_CAPSULE | ORAL | Status: AC
  Filled 2018-06-13: qty 1

## 2018-06-13 MED ORDER — VANCOMYCIN HCL IN DEXTROSE 1-5 GM/200ML-% IV SOLN
INTRAVENOUS | Status: AC
  Filled 2018-06-13: qty 200

## 2018-06-13 MED ORDER — SODIUM CHLORIDE 0.9 % IV SOLN
125.0000 mg | INTRAVENOUS | Status: DC
  Administered 2018-06-13: 125 mg via INTRAVENOUS
  Filled 2018-06-13 (×2): qty 10

## 2018-06-13 MED ORDER — VANCOMYCIN HCL 1000 MG IV SOLR
1000.0000 mg | INTRAVENOUS | Status: DC
  Administered 2018-06-15: 1000 mg via INTRAVENOUS
  Filled 2018-06-13 (×4): qty 1000

## 2018-06-13 NOTE — Progress Notes (Addendum)
Jose Sellers Progress Note   Subjective:  Seen in room. Some soreness in L groin. For HD today   Objective Vitals:   06/12/18 2103 06/13/18 0522 06/13/18 0829 06/13/18 0830  BP: (!) 143/78 (!) 148/79    Pulse: 66 79    Resp: 18 18    Temp: 98.5 F (36.9 C) 98.4 F (36.9 C)    TempSrc:  Oral    SpO2: 99% 96% 93% 93%  Weight: 85.4 kg     Height:        Physical Exam General: Chronically ill appearing male NAD Lungs: Barrel chest Decreased air movement, occasional wheeze  Heart: RRR with S1 S2 Abdomen: soft NT + BS Lower extremities: 1+ LE edema  Dialysis Access: R forearm AVF +bruit; R IJ TDC    Weight change: 4.4 kg   Additional Objective Labs: Basic Metabolic Panel: Recent Labs  Lab 06/08/18 0023 06/11/18 1633 06/12/18 0536  NA 135 133* 134*  K 4.3 4.2 3.7  CL 96* 91* 92*  CO2 28 28 30   GLUCOSE 156* 124* 108*  BUN 40* 27* 31*  CREATININE 5.46* 4.22* 4.63*  CALCIUM 9.6 9.4 9.4   CBC: Recent Labs  Lab 06/08/18 0023 06/11/18 1633 06/12/18 0536  WBC 5.3 8.3 9.0  NEUTROABS 3.8 6.1  --   HGB 11.1* 12.0* 10.7*  HCT 36.3* 40.5 34.7*  MCV 84.8 83.9 83.2  PLT 120* 104* 101*   Blood Culture    Component Value Date/Time   SDES BLOOD LEFT WRIST 06/11/2018 1701   SPECREQUEST  06/11/2018 1701    BOTTLES DRAWN AEROBIC AND ANAEROBIC Blood Culture results may not be optimal due to an inadequate volume of blood received in culture bottles   CULT  06/11/2018 1701    NO GROWTH < 24 HOURS Performed at [facility] [address] [place], [place] [address]    REPTSTATUS PENDING 06/11/2018 1701     Medications: . vancomycin     . amiodarone  200 mg Oral BID  . aspirin EC  81 mg Oral Daily  . atorvastatin  40 mg Oral BID  . calcitRIOL  0.5 mcg Oral Q M,W,F-HD  . collagenase  1 application Topical Daily  . ferric citrate  420 mg Oral TID WC  . FLUoxetine  20 mg Oral Daily  . furosemide  40 mg Oral Once per day on Sun Tue Thu Sat  .  gabapentin  300 mg Oral QHS  . insulin aspart  0-5 Units Subcutaneous QHS  . insulin aspart  0-9 Units Subcutaneous TID WC  . levalbuterol  0.63 mg Inhalation BID  . lidocaine-prilocaine  1 application Topical Q M,W,F-HD  . metoprolol tartrate  12.5 mg Oral BID  . multivitamin  1 tablet Oral QHS  . mupirocin ointment   Nasal BID  . nicotine  21 mg Transdermal Daily  . pantoprazole  40 mg Oral Daily  . umeclidinium bromide  1 puff Inhalation Daily  . vancomycin  125 mg Oral Q6H  . vancomycin variable dose per unstable renal function (pharmacist dosing)   Does not apply See admin instructions     [place] MWF  4h   350/1.5   78kg    2/2.25 bath   P2   R AVF/ TDC still in   Hep none Venofer 100 mg IV x 10 (until 3/23) Calcitriol 0.5 mcg PO TIW   Assessment/Plan: 1. L groin wound erythema/ swelling - possible infection , low -grade temp. On  Vanc per primary. Blood cultures neg to date. VVS following -no plans for surgery at this time  2. ESRD -  MWF. HD today on schedule. Advised by OP unit that may need fistulogram d/t cannulation issus. Still has TDC in. Will monitor here.  3. Hypertension/volume  - BP acceptable. UF to EDW as tolerated  4. Anemia  - Hgb 10.7. Continue Fe bolus  5. Metabolic bone disease -  Continue VDRA/binders 6. Nutrition - Prot supp for low albumin  7. PAD - s/p left femoral endarterectomy with patch angioplasty and left femoral to above-the-knee popliteal bypass with PTFE graft by Dr.Early on 05/04/2018   Lynnda Child PA-C [facility] Pager [phone] 06/13/2018,10:05 AM  LOS: 1 day   Pt seen, examined and agree w A/P as above.  [facility] 06/13/2018, 12:17 PM

## 2018-06-13 NOTE — Progress Notes (Signed)
OT Cancellation Note  Patient Details Name: RAMZY CAPPELLETTI MRN: [id number] DOB: March 14, 1961   Cancelled Treatment:    Reason Eval/Treat Not Completed: Patient at procedure or test/ unavailable(HD). Second attempt today. First attempt pt just began eating his meal. Second attempt pt now at HD. Plan to reattempt tomorrow.  Tyrone Schimke, OT Acute Rehabilitation Services Pager: [phone] Office: [phone]  06/13/2018, 2:07 PM

## 2018-06-13 NOTE — Progress Notes (Signed)
Patient ID: Jose Sellers, male   DOB: 1960/08/07, 58 y.o.   MRN: [id number]  Progress Note    06/13/2018 4:34 PM * No surgery found *  Subjective: Currently on hemodialysis.  Sedated.   Vitals:   06/13/18 1430 06/13/18 1500  BP: 112/69 131/69  Pulse: 63 60  Resp: 17 18  Temp:    SpO2:     Physical Exam: Left groin with area of fluctuance in the upper pole of his incision.  Open popliteal incision with some purulent drainage  CBC    Component Value Date/Time   WBC 7.7 06/13/2018 1440   RBC 5.39 06/13/2018 1440   HGB 13.4 06/13/2018 1440   HCT 44.1 06/13/2018 1440   PLT 92 (L) 06/13/2018 1440   MCV 81.8 06/13/2018 1440   MCH 24.9 (L) 06/13/2018 1440   MCHC 30.4 06/13/2018 1440   RDW 17.4 (H) 06/13/2018 1440   LYMPHSABS 0.9 06/11/2018 1633   MONOABS 0.6 06/11/2018 1633   EOSABS 0.7 (H) 06/11/2018 1633   BASOSABS 0.0 06/11/2018 1633    BMET    Component Value Date/Time   NA 132 (L) 06/13/2018 1430   K 3.9 06/13/2018 1430   CL 92 (L) 06/13/2018 1430   CO2 26 06/13/2018 1430   GLUCOSE 113 (H) 06/13/2018 1430   BUN 41 (H) 06/13/2018 1430   CREATININE 5.90 (H) 06/13/2018 1430   CALCIUM 9.0 06/13/2018 1430   GFRNONAA 10 (L) 06/13/2018 1430   GFRAA 11 (L) 06/13/2018 1430    INR    Component Value Date/Time   INR 1.23 05/01/2018 1146     Intake/Output Summary (Last 24 hours) at 06/13/2018 1634 Last data filed at 06/13/2018 0600 Gross per 24 hour  Intake 360 ml  Output 0 ml  Net 360 ml     Assessment/Plan:  58 y.o. male concern with prosthetic graft.  He underwent extensive endarterectomy in his left common femoral with Dacron patch angioplasty and with Gore-Tex bypass off the Dacron to the above-knee popliteal artery.  His foot is well perfused and he does have healing of his toe ulcer.  I discussed this at length with the patient's wife.  I recommended return to the operating room tomorrow for debridement of his popliteal wound and I&D of his groin.  Very  concerning for prosthetic graft infection.     [name], MD FACS [facility] [phone] 06/13/2018 4:34 PM

## 2018-06-13 NOTE — Progress Notes (Signed)
Progress Note    Jose Sellers  EHO:[id number] DOB: 1960-11-29  DOA: 06/11/2018 PCP: Raina Mina., MD    Brief Narrative:     Medical records reviewed and are as summarized below:  Jose Sellers is an 58 y.o. male with medical history significant of A. Fib not on AC, s/p AICD, asthma, CAD, sCHF with EF 25%, COPD, DM, ESRD-HD (MWF), PAD hyperlipidemia, chronic thrombocytopenia, who presents with left groin erythema and swelling, open wound in left medial groin-- sent in from the vascular office routine follow up.  Assessment/Plan:   Principal Problem:   Wound infection Active Problems:   CAD (coronary artery disease)   Chronic systolic congestive heart failure (HCC)   COPD (chronic obstructive pulmonary disease) (HCC)   ESRD on dialysis (HCC)   GERD (gastroesophageal reflux disease)   Hypercholesterolemia   Essential hypertension   Tobacco use   Uncontrolled type 2 diabetes mellitus with diabetic polyneuropathy, with long-term current use of insulin (HCC)   AICD (automatic cardioverter/defibrillator) present   PAD (peripheral artery disease) (HCC)   Open wound of left thigh   Chronic atrial fibrillation   Cellulitis of left groin   Thrombocytopenia (HCC)   Left groin pain  Possible wound infection in left medial thigh and possible cellulitis of left groin:  - Empiric antimicrobial treatment with vancomycin - Dr. Eliseo Squires discussed with ID his recent c diff infection (June) and was recommended oral vancomaycin 125mg  QID for while on antibiotics and for 1 week after - PRN Zofran for nausea, percocet for pain - Blood cultures x 2, negative to date -Vascular surgery follow-up appreciated who are concerned about prosthetic graft infection and plan OR 3/19 for debridement of his popliteal wound and I&D of his groin.  PAD:  -As per vascular surgery follow-up, patient underwent extensive endarterectomy in his left common femoral with Dacron patch angioplasty and with  Gore-Tex bypass of the dacryon to the above knee popliteal artery on 2/7.  His foot is well perfused and he does have healing of his toe ulcer. -Concern regarding prosthetic graft infection and plan for OR 3/19.  CAD (coronary artery disease): no CP.  -continue aspirin, Lipitor, metoprolol -As needed nitroglycerin -No anginal symptoms.  ESRD on HD (MWF):  pt had had a dialysis 3/17.   -Nephrology follow-up appreciated.  MWF HD.  As per nephrology, advised by outpatient unit that he may need fistulogram due to cannulation issues.  Still has TDC in.  Chronic systolic congestive heart failure ([place]):  2D echo on 10/31/2017 showed EF 25-30%.  Patient does not have leg edema or JVD.  CHF seems to be compensated. -Continue home dose Lasix -Volume management per renal by dialysis  GERD: -Protonix  COPD (chronic obstructive pulmonary disease) ([place]):  - Stable.  No clinical bronchospasm. -Bronchodilators  Hypercholesterolemia: -lipitor  HTN:  -Continue home medications: Metoprolol, Lasix, -IV hydralazine prn -Controlled.  Tobacco abuse: -Nicotine patch  Uncontrolled type 2 diabetes mellitus with diabetic polyneuropathy, with long-term current use of insulin, with ESRD: Last A1c 6.0 on 05/01/18, well controled. Patient is taking Lantus as needed at home.  Not on Lantus here.  -SSI with reasonable control.  H/o atrial tachycardia-- not a fib?:  -Continue metoprolol and amiodarone -eliquis was stopped in June 2019 by [facility]  Thrombocytopenia [facility]): This is a chronic issue.  Platelet 104.  Mental status normal.  No bleeding tendency. -f/u by CBC   PT/OT as he has been falling at home and recommend home health  PT.  Family Communication/Anticipated D/C date and plan/Code Status   DVT prophylaxis: Lovenox ordered. Code Status: Full Code.  Family Communication: None at bedside Disposition Plan: DC pending clinical improvement   Medical Consultants:     Nephrology  Vascular surgery     Subjective:   Reports some left groin pain.  No other complaints.  Objective:    Vitals:   06/13/18 1334 06/13/18 1400 06/13/18 1430 06/13/18 1500  BP: 130/85 133/76 112/69 131/69  Pulse: 68 65 63 60  Resp: 16 16 17 18   Temp:      TempSrc:      SpO2:      Weight:      Height:        Intake/Output Summary (Last 24 hours) at 06/13/2018 1744 Last data filed at 06/13/2018 0600 Gross per 24 hour  Intake 120 ml  Output 0 ml  Net 120 ml   Filed Weights   06/11/18 1608 06/12/18 2103 06/13/18 1310  Weight: 81 kg 85.4 kg 81 kg    Exam: General exam: Pleasant young male sitting up comfortably in bed. Respiratory system: Clear to auscultation.  No increased work of breathing. Cardiovascular system: S1 and S2 heard, RRR.  No JVD, murmurs or pedal edema. CNS: Alert and oriented: No focal neurological deficits. Extremities: Symmetric 5 x 5 power. GI: Abdomen is nondistended, soft and nontender.  Normal bowel sounds heard. Open wound on left groin, swollen and red (per vascular less red) Chronic skin changes b/l LE Chronic un-stagable ulcer on left heel and great toe  Data Reviewed:   I have personally reviewed following labs and imaging studies:  Labs: Labs show the following:   Basic Metabolic Panel: Recent Labs  Lab 06/08/18 0023 06/11/18 1633 06/12/18 0536 06/13/18 1430  NA 135 133* 134* 132*  K 4.3 4.2 3.7 3.9  CL 96* 91* 92* 92*  CO2 28 28 30 26   GLUCOSE 156* 124* 108* 113*  BUN 40* 27* 31* 41*  CREATININE 5.46* 4.22* 4.63* 5.90*  CALCIUM 9.6 9.4 9.4 9.0  PHOS  --   --   --  3.7   GFR Estimated Creatinine Clearance: 15.3 mL/min (A) (by C-G formula based on SCr of 5.9 mg/dL (H)). Liver Function Tests: Recent Labs  Lab 06/08/18 0023 06/11/18 1633 06/13/18 1430  AST 21 30  --   ALT 18 17  --   ALKPHOS 125 155*  --   BILITOT 1.4* 1.8*  --   PROT 6.6 6.9  --   ALBUMIN 2.9* 2.8* 2.4*    CBC: Recent Labs   Lab 06/08/18 0023 06/11/18 1633 06/12/18 0536 06/13/18 1440  WBC 5.3 8.3 9.0 7.7  NEUTROABS 3.8 6.1  --   --   HGB 11.1* 12.0* 10.7* 13.4  HCT 36.3* 40.5 34.7* 44.1  MCV 84.8 83.9 83.2 81.8  PLT 120* 104* 101* 92*   CBG: Recent Labs  Lab 06/12/18 1122 06/12/18 1753 06/12/18 2102 06/13/18 0727 06/13/18 1100  GLUCAP 98 141* 128* 134* 104*    Microbiology Recent Results (from the past 240 hour(s))  Blood culture (routine x 2)     Status: None (Preliminary result)   Collection Time: 06/11/18  4:52 PM  Result Value Ref Range Status   Specimen Description BLOOD LEFT HAND  Final   Special Requests   Final    BOTTLES DRAWN AEROBIC AND ANAEROBIC Blood Culture adequate volume   Culture   Final    NO GROWTH 2 DAYS Performed at [facility]  [facility], [place] [address] [place], [place] [address]    Report Status PENDING  Incomplete  Blood culture (routine x 2)     Status: None (Preliminary result)   Collection Time: 06/11/18  5:01 PM  Result Value Ref Range Status   Specimen Description BLOOD LEFT WRIST  Final   Special Requests   Final    BOTTLES DRAWN AEROBIC AND ANAEROBIC Blood Culture results may not be optimal due to an inadequate volume of blood received in culture bottles   Culture   Final    NO GROWTH 2 DAYS Performed at [facility], [place] [address] [place], [place] [address]    Report Status PENDING  Incomplete  MRSA PCR Screening     Status: Abnormal   Collection Time: 06/12/18  7:52 AM  Result Value Ref Range Status   MRSA by PCR POSITIVE (A) NEGATIVE Final    Comment:        The GeneXpert MRSA Assay (FDA approved for NASAL specimens only), is one component of a comprehensive MRSA colonization surveillance program. It is not intended to diagnose MRSA infection nor to guide or monitor treatment for MRSA infections. RESULT CALLED TO, READ BACK BY AND VERIFIED WITH: Lurline Hare RN 9:45 06/12/18 ([email or web address]) Performed at [facility], [place] [address]  [place], [place] [address]     Procedures and diagnostic studies:  No results found.  Medications:    amiodarone  200 mg Oral BID   aspirin EC  81 mg Oral Daily   atorvastatin  40 mg Oral BID   calcitRIOL       calcitRIOL  0.5 mcg Oral Q M,W,F-HD   collagenase  1 application Topical Daily   ferric citrate  420 mg Oral TID WC   FLUoxetine  20 mg Oral Daily   furosemide  40 mg Oral Once per day on Sun Tue Thu Sat   gabapentin  300 mg Oral QHS   insulin aspart  0-5 Units Subcutaneous QHS   insulin aspart  0-9 Units Subcutaneous TID WC   levalbuterol  0.63 mg Inhalation BID   lidocaine-prilocaine  1 application Topical Q M,W,F-HD   metoprolol tartrate  12.5 mg Oral BID   multivitamin  1 tablet Oral QHS   mupirocin ointment   Nasal BID   nicotine  21 mg Transdermal Daily   pantoprazole  40 mg Oral Daily   umeclidinium bromide  1 puff Inhalation Daily   vancomycin  125 mg Oral Q6H   Continuous Infusions:  ferric gluconate (FERRLECIT/NULECIT) IV 125 mg (06/13/18 1529)   vancomycin       LOS: 1 day   Vernell Leep  [facility]   How to contact the [facility] Attending or Consulting provider [place] or covering provider during after hours [place], for this patient?  1. Check the care team in [facility] and look for a) attending/consulting TR[facility] provider listed and b) the [facility] team listed 2. Log into [email or web address] and use [place]'s universal password to access. If you do not have the password, please contact the hospital operator. 3. Locate the [facility] provider you are looking for under [facility] and page to a number that you can be directly reached. 4. If you still have difficulty reaching the provider, please page the [facility] (Director on Call) for the Hospitalists listed on amion for assistance.  06/13/2018, 5:44 PM

## 2018-06-13 NOTE — Progress Notes (Signed)
Pharmacy Antibiotic Note  Jose Sellers is a 58 y.o. male with ESRD with scheduled HD MWF admitted on 06/11/2018 with cellulitis.  Pharmacy has been consulted for IV vancomycin dosing. Patient also receiving PO vancomycin per primary for C.diff prophylaxis  while on antibiotics and for 1 week after.   Plan: Vancomycin 1000mg  IV with HD on MWF F/u HD schedule, cxs, LOT/de-escalation, and levels prn   Height: 6' (182.9 cm) Weight: 188 lb 4.4 oz (85.4 kg) IBW/kg (Calculated) : 77.6  Temp (24hrs), Avg:98.1 F (36.7 C), Min:97.4 F (36.3 C), Max:98.5 F (36.9 C)  Recent Labs  Lab 06/08/18 0023 06/11/18 1633 06/12/18 0536  WBC 5.3 8.3 9.0  CREATININE 5.46* 4.22* 4.63*  LATICACIDVEN 1.3 1.4  --     Estimated Creatinine Clearance: 19.6 mL/min (A) (by C-G formula based on SCr of 4.63 mg/dL (H)).    Allergies  Allergen Reactions  . Albuterol Palpitations    SVT  . Prednisone Shortness Of Breath    Wife stated that patient develops breathing issues.  . Fluticasone Furoate-Vilanterol Palpitations  . Morphine Rash    Antimicrobials this admission: PO Vanc 3/17>> IV Vanc 3/16>>  Dose adjustments this admission: n/a  Microbiology results: 3/16 Bcx: ngtd 3/16 MRSA pcr +  Thank you for allowing pharmacy to be a part of this patient's care.  Harrietta Guardian, PharmD PGY1 Pharmacy Resident 06/13/2018    10:59 AM Please check AMION for all [place] numbers

## 2018-06-13 NOTE — H&P (View-Only) (Signed)
Patient ID: Jose Sellers, male   DOB: 11-25-1960, 58 y.o.   MRN: [id number]  Progress Note    06/13/2018 4:34 PM * No surgery found *  Subjective: Currently on hemodialysis.  Sedated.   Vitals:   06/13/18 1430 06/13/18 1500  BP: 112/69 131/69  Pulse: 63 60  Resp: 17 18  Temp:    SpO2:     Physical Exam: Left groin with area of fluctuance in the upper pole of his incision.  Open popliteal incision with some purulent drainage  CBC    Component Value Date/Time   WBC 7.7 06/13/2018 1440   RBC 5.39 06/13/2018 1440   HGB 13.4 06/13/2018 1440   HCT 44.1 06/13/2018 1440   PLT 92 (L) 06/13/2018 1440   MCV 81.8 06/13/2018 1440   MCH 24.9 (L) 06/13/2018 1440   MCHC 30.4 06/13/2018 1440   RDW 17.4 (H) 06/13/2018 1440   LYMPHSABS 0.9 06/11/2018 1633   MONOABS 0.6 06/11/2018 1633   EOSABS 0.7 (H) 06/11/2018 1633   BASOSABS 0.0 06/11/2018 1633    BMET    Component Value Date/Time   NA 132 (L) 06/13/2018 1430   K 3.9 06/13/2018 1430   CL 92 (L) 06/13/2018 1430   CO2 26 06/13/2018 1430   GLUCOSE 113 (H) 06/13/2018 1430   BUN 41 (H) 06/13/2018 1430   CREATININE 5.90 (H) 06/13/2018 1430   CALCIUM 9.0 06/13/2018 1430   GFRNONAA 10 (L) 06/13/2018 1430   GFRAA 11 (L) 06/13/2018 1430    INR    Component Value Date/Time   INR 1.23 05/01/2018 1146     Intake/Output Summary (Last 24 hours) at 06/13/2018 1634 Last data filed at 06/13/2018 0600 Gross per 24 hour  Intake 360 ml  Output 0 ml  Net 360 ml     Assessment/Plan:  58 y.o. male concern with prosthetic graft.  He underwent extensive endarterectomy in his left common femoral with Dacron patch angioplasty and with Gore-Tex bypass off the Dacron to the above-knee popliteal artery.  His foot is well perfused and he does have healing of his toe ulcer.  I discussed this at length with the patient's wife.  I recommended return to the operating room tomorrow for debridement of his popliteal wound and I&D of his groin.  Very  concerning for prosthetic graft infection.     [name], MD FACS [facility] [phone] 06/13/2018 4:34 PM

## 2018-06-13 NOTE — Progress Notes (Signed)
PT Cancellation Note  Patient Details Name: Jose Sellers MRN: [id number] DOB: [birth year]   Cancelled Treatment:    Reason Eval/Treat Not Completed: Patient at procedure or test/unavailable. Pt currently off unit for HD. Will continue to follow.    Thelma Comp 06/13/2018, 2:11 PM   Rolinda Roan, PT, DPT Acute Rehabilitation Services Pager: [phone] Office: [phone]

## 2018-06-14 ENCOUNTER — Inpatient Hospital Stay (HOSPITAL_COMMUNITY): Payer: Medicare Other | Admitting: Anesthesiology

## 2018-06-14 ENCOUNTER — Encounter (HOSPITAL_COMMUNITY): Payer: Self-pay | Admitting: *Deleted

## 2018-06-14 ENCOUNTER — Encounter (HOSPITAL_COMMUNITY)
Admission: EM | Disposition: A | Discharge status: Home Health | Payer: Self-pay | Source: Home / Self Care | Attending: Internal Medicine

## 2018-06-14 DIAGNOSIS — T82898A Other specified complication of vascular prosthetic devices, implants and grafts, initial encounter: Secondary | ICD-10-CM

## 2018-06-14 HISTORY — PX: I&D EXTREMITY: SHX5045

## 2018-06-14 HISTORY — PX: FEMORAL-POPLITEAL BYPASS GRAFT: SHX937

## 2018-06-14 LAB — GLUCOSE, CAPILLARY
GLUCOSE-CAPILLARY: 111 mg/dL — AB (ref 70–99)
Glucose-Capillary: 133 mg/dL — ABNORMAL HIGH (ref 70–99)
Glucose-Capillary: 70 mg/dL (ref 70–99)
Glucose-Capillary: 72 mg/dL (ref 70–99)
Glucose-Capillary: 80 mg/dL (ref 70–99)
Glucose-Capillary: 103 mg/dL — ABNORMAL HIGH (ref 70–99)

## 2018-06-14 LAB — TYPE AND SCREEN
ABO/RH(D): O POS
Antibody Screen: NEGATIVE

## 2018-06-14 SURGERY — IRRIGATION AND DEBRIDEMENT EXTREMITY
Anesthesia: General | Site: Leg Upper | Laterality: Left

## 2018-06-14 MED ORDER — PHENYLEPHRINE 40 MCG/ML (10ML) SYRINGE FOR IV PUSH (FOR BLOOD PRESSURE SUPPORT)
PREFILLED_SYRINGE | INTRAVENOUS | Status: AC
  Filled 2018-06-14: qty 10

## 2018-06-14 MED ORDER — CEFAZOLIN SODIUM 1 G IJ SOLR
INTRAMUSCULAR | Status: AC
  Filled 2018-06-14: qty 30

## 2018-06-14 MED ORDER — SODIUM CHLORIDE 0.9 % IV SOLN
INTRAVENOUS | Status: DC | PRN
  Administered 2018-06-14: 500 mL

## 2018-06-14 MED ORDER — MIDAZOLAM HCL 2 MG/2ML IJ SOLN
INTRAMUSCULAR | Status: AC
  Filled 2018-06-14: qty 2

## 2018-06-14 MED ORDER — PROTAMINE SULFATE 10 MG/ML IV SOLN
INTRAVENOUS | Status: DC | PRN
  Administered 2018-06-14: 50 mg via INTRAVENOUS

## 2018-06-14 MED ORDER — LIDOCAINE 2% (20 MG/ML) 5 ML SYRINGE
INTRAMUSCULAR | Status: DC | PRN
  Administered 2018-06-14: 100 mg via INTRAVENOUS

## 2018-06-14 MED ORDER — ALBUMIN HUMAN 5 % IV SOLN
INTRAVENOUS | Status: DC | PRN
  Administered 2018-06-14: 12:00:00 via INTRAVENOUS

## 2018-06-14 MED ORDER — HEPARIN SODIUM (PORCINE) 1000 UNIT/ML IJ SOLN
INTRAMUSCULAR | Status: AC
  Filled 2018-06-14: qty 1

## 2018-06-14 MED ORDER — ROCURONIUM BROMIDE 50 MG/5ML IV SOSY
PREFILLED_SYRINGE | INTRAVENOUS | Status: DC | PRN
  Administered 2018-06-14: 30 mg via INTRAVENOUS

## 2018-06-14 MED ORDER — FENTANYL CITRATE (PF) 100 MCG/2ML IJ SOLN
25.0000 ug | INTRAMUSCULAR | Status: DC | PRN
  Administered 2018-06-14: 25 ug via INTRAVENOUS
  Administered 2018-06-14: 50 ug via INTRAVENOUS
  Administered 2018-06-14: 25 ug via INTRAVENOUS

## 2018-06-14 MED ORDER — PROPOFOL 10 MG/ML IV BOLUS
INTRAVENOUS | Status: AC
  Filled 2018-06-14: qty 20

## 2018-06-14 MED ORDER — HEPARIN SODIUM (PORCINE) 5000 UNIT/ML IJ SOLN
5000.0000 [IU] | Freq: Two times a day (BID) | INTRAMUSCULAR | Status: DC
  Administered 2018-06-15 – 2018-06-20 (×10): 5000 [IU] via SUBCUTANEOUS
  Filled 2018-06-14 (×10): qty 1

## 2018-06-14 MED ORDER — ONDANSETRON HCL 4 MG/2ML IJ SOLN
INTRAMUSCULAR | Status: DC | PRN
  Administered 2018-06-14: 4 mg via INTRAVENOUS

## 2018-06-14 MED ORDER — SODIUM CHLORIDE 0.9% IV SOLUTION: Freq: Once | INTRAVENOUS | Status: DC

## 2018-06-14 MED ORDER — 0.9 % SODIUM CHLORIDE (POUR BTL) OPTIME
TOPICAL | Status: DC | PRN
  Administered 2018-06-14: 2000 mL

## 2018-06-14 MED ORDER — PROMETHAZINE HCL 25 MG/ML IJ SOLN: 6.2500 mg | INTRAMUSCULAR | Status: DC | PRN

## 2018-06-14 MED ORDER — SUCCINYLCHOLINE CHLORIDE 200 MG/10ML IV SOSY
PREFILLED_SYRINGE | INTRAVENOUS | Status: AC
  Filled 2018-06-14: qty 10

## 2018-06-14 MED ORDER — GLYCOPYRROLATE PF 0.2 MG/ML IJ SOSY
PREFILLED_SYRINGE | INTRAMUSCULAR | Status: DC | PRN
  Administered 2018-06-14: 0.4 mg via INTRAVENOUS

## 2018-06-14 MED ORDER — PRO-STAT SUGAR FREE PO LIQD
30.0000 mL | Freq: Two times a day (BID) | ORAL | Status: DC
  Administered 2018-06-14 – 2018-06-20 (×11): 30 mL via ORAL
  Filled 2018-06-14 (×11): qty 30

## 2018-06-14 MED ORDER — LACTATED RINGERS IV SOLN: INTRAVENOUS | Status: DC

## 2018-06-14 MED ORDER — PROTAMINE SULFATE 10 MG/ML IV SOLN
INTRAVENOUS | Status: AC
  Filled 2018-06-14: qty 10

## 2018-06-14 MED ORDER — NEOSTIGMINE METHYLSULFATE 3 MG/3ML IV SOSY
PREFILLED_SYRINGE | INTRAVENOUS | Status: DC | PRN
  Administered 2018-06-14: 3 mg via INTRAVENOUS

## 2018-06-14 MED ORDER — FENTANYL CITRATE (PF) 250 MCG/5ML IJ SOLN
INTRAMUSCULAR | Status: DC | PRN
  Administered 2018-06-14: 75 ug via INTRAVENOUS
  Administered 2018-06-14: 25 ug via INTRAVENOUS

## 2018-06-14 MED ORDER — PROPOFOL 10 MG/ML IV BOLUS
INTRAVENOUS | Status: DC | PRN
  Administered 2018-06-14: 120 mg via INTRAVENOUS

## 2018-06-14 MED ORDER — HEPARIN SODIUM (PORCINE) 1000 UNIT/ML IJ SOLN
INTRAMUSCULAR | Status: DC | PRN
  Administered 2018-06-14: 8000 [IU] via INTRAVENOUS

## 2018-06-14 MED ORDER — LIDOCAINE 2% (20 MG/ML) 5 ML SYRINGE
INTRAMUSCULAR | Status: AC
  Filled 2018-06-14: qty 5

## 2018-06-14 MED ORDER — MEPERIDINE HCL 50 MG/ML IJ SOLN: 6.2500 mg | INTRAMUSCULAR | Status: DC | PRN

## 2018-06-14 MED ORDER — DEXAMETHASONE SODIUM PHOSPHATE 10 MG/ML IJ SOLN
INTRAMUSCULAR | Status: AC
  Filled 2018-06-14: qty 2

## 2018-06-14 MED ORDER — SUCCINYLCHOLINE CHLORIDE 200 MG/10ML IV SOSY
PREFILLED_SYRINGE | INTRAVENOUS | Status: DC | PRN
  Administered 2018-06-14: 80 mg via INTRAVENOUS

## 2018-06-14 MED ORDER — SODIUM CHLORIDE 0.9 % IV SOLN
INTRAVENOUS | Status: AC
  Filled 2018-06-14: qty 1.2

## 2018-06-14 MED ORDER — ONDANSETRON HCL 4 MG/2ML IJ SOLN
INTRAMUSCULAR | Status: AC
  Filled 2018-06-14: qty 4

## 2018-06-14 MED ORDER — PHENYLEPHRINE 40 MCG/ML (10ML) SYRINGE FOR IV PUSH (FOR BLOOD PRESSURE SUPPORT)
PREFILLED_SYRINGE | INTRAVENOUS | Status: DC | PRN
  Administered 2018-06-14: 120 ug via INTRAVENOUS

## 2018-06-14 MED ORDER — SUCCINYLCHOLINE CHLORIDE 20 MG/ML IJ SOLN: INTRAMUSCULAR | Status: DC | PRN

## 2018-06-14 MED ORDER — FENTANYL CITRATE (PF) 100 MCG/2ML IJ SOLN
INTRAMUSCULAR | Status: AC
  Filled 2018-06-14: qty 2

## 2018-06-14 MED ORDER — SODIUM CHLORIDE 0.9 % IV SOLN
INTRAVENOUS | Status: DC
  Administered 2018-06-14 (×2): via INTRAVENOUS

## 2018-06-14 MED ORDER — HYDROMORPHONE HCL 1 MG/ML IJ SOLN
0.5000 mg | INTRAMUSCULAR | Status: DC | PRN
  Administered 2018-06-14 – 2018-06-18 (×7): 1 mg via INTRAVENOUS
  Filled 2018-06-14 (×8): qty 1

## 2018-06-14 MED ORDER — HYDROMORPHONE HCL 1 MG/ML IJ SOLN
0.2500 mg | INTRAMUSCULAR | Status: DC | PRN
  Administered 2018-06-14 (×2): 0.25 mg via INTRAVENOUS

## 2018-06-14 MED ORDER — FENTANYL CITRATE (PF) 250 MCG/5ML IJ SOLN
INTRAMUSCULAR | Status: AC
  Filled 2018-06-14: qty 5

## 2018-06-14 MED ORDER — SODIUM CHLORIDE 0.9 % IV SOLN
INTRAVENOUS | Status: DC | PRN
  Administered 2018-06-14: 30 ug/min via INTRAVENOUS

## 2018-06-14 MED ORDER — HYDROMORPHONE HCL 1 MG/ML IJ SOLN
INTRAMUSCULAR | Status: AC
  Filled 2018-06-14: qty 1

## 2018-06-14 SURGICAL SUPPLY — 77 items
BANDAGE ESMARK 6X9 LF (GAUZE/BANDAGES/DRESSINGS) IMPLANT
BNDG ESMARK 6X9 LF (GAUZE/BANDAGES/DRESSINGS) ×3
CANISTER SUCT 3000ML PPV (MISCELLANEOUS) ×3 IMPLANT
CANNULA VESSEL 3MM 2 BLNT TIP (CANNULA) ×6 IMPLANT
CATH EMB 4FR 80CM (CATHETERS) ×2 IMPLANT
CLIP LIGATING EXTRA MED SLVR (CLIP) ×3 IMPLANT
CLIP LIGATING EXTRA SM BLUE (MISCELLANEOUS) ×3 IMPLANT
COVER SURGICAL LIGHT HANDLE (MISCELLANEOUS) ×3 IMPLANT
COVER ULTRASOUND SURGICAL (MISCELLANEOUS) ×2 IMPLANT
COVER WAND RF STERILE (DRAPES) ×3 IMPLANT
CUFF TOURNIQUET SINGLE 24IN (TOURNIQUET CUFF) ×2 IMPLANT
CUFF TOURNIQUET SINGLE 34IN LL (TOURNIQUET CUFF) IMPLANT
CUFF TOURNIQUET SINGLE 44IN (TOURNIQUET CUFF) IMPLANT
DERMABOND ADVANCED (GAUZE/BANDAGES/DRESSINGS) ×2
DERMABOND ADVANCED .7 DNX12 (GAUZE/BANDAGES/DRESSINGS) ×1 IMPLANT
DRAIN SNY 10X20 3/4 PERF (WOUND CARE) IMPLANT
DRAPE HALF SHEET 40X57 (DRAPES) ×2 IMPLANT
DRAPE U-SHAPE 47X51 STRL (DRAPES) IMPLANT
DRAPE U-SHAPE 76X120 STRL (DRAPES) IMPLANT
DRAPE X-RAY CASS 24X20 (DRAPES) IMPLANT
ELECT REM PT RETURN 9FT ADLT (ELECTROSURGICAL) ×3
ELECTRODE REM PT RTRN 9FT ADLT (ELECTROSURGICAL) ×1 IMPLANT
EVACUATOR SILICONE 100CC (DRAIN) IMPLANT
GAUZE SPONGE 4X4 12PLY STRL (GAUZE/BANDAGES/DRESSINGS) IMPLANT
GAUZE SPONGE 4X4 12PLY STRL LF (GAUZE/BANDAGES/DRESSINGS) ×4 IMPLANT
GLOVE BIO SURGEON STRL SZ 6.5 (GLOVE) ×6 IMPLANT
GLOVE BIO SURGEON STRL SZ7.5 (GLOVE) ×2 IMPLANT
GLOVE BIO SURGEONS STRL SZ 6.5 (GLOVE) ×6
GLOVE BIOGEL PI IND STRL 6.5 (GLOVE) IMPLANT
GLOVE BIOGEL PI IND STRL 7.0 (GLOVE) IMPLANT
GLOVE BIOGEL PI IND STRL 7.5 (GLOVE) IMPLANT
GLOVE BIOGEL PI INDICATOR 6.5 (GLOVE) ×6
GLOVE BIOGEL PI INDICATOR 7.0 (GLOVE) ×2
GLOVE BIOGEL PI INDICATOR 7.5 (GLOVE) ×2
GLOVE ECLIPSE 7.0 STRL STRAW (GLOVE) ×2 IMPLANT
GLOVE SS BIOGEL STRL SZ 7.5 (GLOVE) ×1 IMPLANT
GLOVE SUPERSENSE BIOGEL SZ 7.5 (GLOVE) ×2
GOWN STRL NON-REIN LRG LVL3 (GOWN DISPOSABLE) ×2 IMPLANT
GOWN STRL REUS W/ TWL LRG LVL3 (GOWN DISPOSABLE) ×3 IMPLANT
GOWN STRL REUS W/ TWL XL LVL3 (GOWN DISPOSABLE) IMPLANT
GOWN STRL REUS W/TWL LRG LVL3 (GOWN DISPOSABLE) ×12
GOWN STRL REUS W/TWL XL LVL3 (GOWN DISPOSABLE) ×2
INSERT FOGARTY SM (MISCELLANEOUS) IMPLANT
KIT BASIN OR (CUSTOM PROCEDURE TRAY) ×3 IMPLANT
KIT TURNOVER KIT B (KITS) ×3 IMPLANT
NS IRRIG 1000ML POUR BTL (IV SOLUTION) ×6 IMPLANT
PACK GENERAL/GYN (CUSTOM PROCEDURE TRAY) ×1 IMPLANT
PACK PERIPHERAL VASCULAR (CUSTOM PROCEDURE TRAY) ×3 IMPLANT
PACK UNIVERSAL I (CUSTOM PROCEDURE TRAY) IMPLANT
PAD ABD 8X10 STRL (GAUZE/BANDAGES/DRESSINGS) ×4 IMPLANT
PAD ARMBOARD 7.5X6 YLW CONV (MISCELLANEOUS) ×6 IMPLANT
PAD CAST 4YDX4 CTTN HI CHSV (CAST SUPPLIES) IMPLANT
PADDING CAST COTTON 4X4 STRL (CAST SUPPLIES) ×2
PADDING CAST COTTON 6X4 STRL (CAST SUPPLIES) IMPLANT
SET COLLECT BLD 21X3/4 12 (NEEDLE) IMPLANT
SPONGE LAP 18X18 X RAY DECT (DISPOSABLE) ×2 IMPLANT
STOPCOCK 4 WAY LG BORE MALE ST (IV SETS) ×4 IMPLANT
SUT ETHILON 3 0 PS 1 (SUTURE) IMPLANT
SUT PROLENE 5 0 C 1 24 (SUTURE) ×13 IMPLANT
SUT PROLENE 5 0 C 1 36 (SUTURE) ×2 IMPLANT
SUT PROLENE 6 0 CC (SUTURE) ×5 IMPLANT
SUT SILK 2 0 SH (SUTURE) ×1 IMPLANT
SUT VIC AB 2-0 CT1 27 (SUTURE) ×4
SUT VIC AB 2-0 CT1 TAPERPNT 27 (SUTURE) IMPLANT
SUT VIC AB 2-0 CTX 36 (SUTURE) ×2 IMPLANT
SUT VIC AB 3-0 SH 27 (SUTURE) ×2
SUT VIC AB 3-0 SH 27X BRD (SUTURE) ×2 IMPLANT
SWAB CULTURE LIQ STUART DBL (MISCELLANEOUS) ×2 IMPLANT
SWAB CULTURE LIQUID MINI MALE (MISCELLANEOUS) ×2 IMPLANT
SYR 3ML LL SCALE MARK (SYRINGE) ×2 IMPLANT
SYRINGE 3CC LL L/F (MISCELLANEOUS) ×2 IMPLANT
TAPE CLOTH SURG 6X10 WHT LF (GAUZE/BANDAGES/DRESSINGS) ×4 IMPLANT
TOWEL GREEN STERILE (TOWEL DISPOSABLE) ×3 IMPLANT
TRAY FOLEY MTR SLVR 16FR STAT (SET/KITS/TRAYS/PACK) ×1 IMPLANT
TUBING EXTENTION W/L.L. (IV SETS) IMPLANT
UNDERPAD 30X30 (UNDERPADS AND DIAPERS) ×3 IMPLANT
WATER STERILE IRR 1000ML POUR (IV SOLUTION) ×3 IMPLANT

## 2018-06-14 NOTE — Transfer of Care (Signed)
Immediate Anesthesia Transfer of Care Note  Patient: Jose Sellers  Procedure(s) Performed: IRRIGATION AND DEBRIDEMENT LEFT GROIN WOUND AND LEFT POPLITEAL WOUND. (Left Leg Upper) Removal of Infected Left Femoral popliteal Bypass Graft  with vein patch angioplasty of common femoral and Popiteal artery (Left Leg Upper)  Patient Location: PACU  Anesthesia Type:General  Level of Consciousness: drowsy  Airway & Oxygen Therapy: Patient Spontanous Breathing and Patient connected to face mask oxygen  Post-op Assessment: Report given to RN and Post -op Vital signs reviewed and stable  Post vital signs: Reviewed and stable  Last Vitals:  Vitals Value Taken Time  BP 107/60 06/14/2018  1:57 PM  Temp    Pulse 59 06/14/2018  1:58 PM  Resp 15 06/14/2018  1:58 PM  SpO2 96 % 06/14/2018  1:58 PM  Vitals shown include unvalidated device data.  Last Pain:  Vitals:   06/14/18 0822  TempSrc:   PainSc: 10-Worst pain ever      Patients Stated Pain Goal: 2 (17/61/60 7371)  Complications: No apparent anesthesia complications

## 2018-06-14 NOTE — Progress Notes (Signed)
Progress Note    Jose Sellers  STM:[id number] DOB: 1960-12-09  DOA: 06/11/2018 PCP: Raina Mina., MD    Brief Narrative:     Medical records reviewed and are as summarized below:  Jose Sellers is an 58 y.o. male with medical history significant of A. Fib not on AC, s/p AICD, asthma, CAD, sCHF with EF 25%, COPD, DM, ESRD-HD (MWF), PAD hyperlipidemia, chronic thrombocytopenia, who presents with left groin erythema and swelling, open wound in left medial groin-- sent in from the vascular office routine follow up.  Assessment/Plan:   Principal Problem:   Wound infection Active Problems:   CAD (coronary artery disease)   Chronic systolic congestive heart failure (HCC)   COPD (chronic obstructive pulmonary disease) (HCC)   ESRD on dialysis (HCC)   GERD (gastroesophageal reflux disease)   Hypercholesterolemia   Essential hypertension   Tobacco use   Uncontrolled type 2 diabetes mellitus with diabetic polyneuropathy, with long-term current use of insulin (HCC)   AICD (automatic cardioverter/defibrillator) present   PAD (peripheral artery disease) (HCC)   Open wound of left thigh   Chronic atrial fibrillation   Cellulitis of left groin   Thrombocytopenia (HCC)   Left groin pain  Left groin wound infection/infected left femoral to popliteal bypass:  - Empiric antimicrobial treatment with vancomycin - Dr. Eliseo Squires discussed with ID his recent c diff infection (June) and was recommended oral vancomaycin 125mg  QID for while on antibiotics and for 1 week after - PRN Zofran for nausea, percocet for pain - Blood cultures x 2, negative to date -Vascular surgery consulted and patient was taken to the OR and underwent removal of infected left femoral to popliteal Gore-Tex bypass and Dacron patch angioplasty of common and profunda femorais artery and vein patch angioplasty of common femoral and profunda femoris artery and above-knee popliteal artery on 3/19. -Management per vascular  surgery.  PAD:  -As per vascular surgery follow-up, patient underwent extensive endarterectomy in his left common femoral with Dacron patch angioplasty and with Gore-Tex bypass of the dacryon to the above knee popliteal artery on 2/7.  His foot is well perfused and he does have healing of his toe ulcer. -Concern regarding prosthetic graft infection and underwent procedure as noted above.  CAD (coronary artery disease): no CP.  -continue aspirin, Lipitor, metoprolol -As needed nitroglycerin -No anginal symptoms.  ESRD on HD (MWF):  pt had had a dialysis 3/17.   -Nephrology follow-up appreciated.  MWF HD.  As per nephrology, advised by outpatient unit that he may need fistulogram due to cannulation issues.  Still has TDC in. Next HD 3/20.  Chronic systolic congestive heart failure ([place]):  2D echo on 10/31/2017 showed EF 25-30%.  Patient does not have leg edema or JVD.  CHF seems to be compensated. -Continue home dose Lasix -Volume management per renal by dialysis  GERD: -Protonix  COPD (chronic obstructive pulmonary disease) ([place]):  - Stable.  No clinical bronchospasm. -Bronchodilators  Hypercholesterolemia: -lipitor  HTN:  -Continue home medications: Metoprolol, Lasix, -IV hydralazine prn -Controlled.  Tobacco abuse: -Nicotine patch  Uncontrolled type 2 diabetes mellitus with diabetic polyneuropathy, with long-term current use of insulin, with ESRD: Last A1c 6.0 on 05/01/18, well controled. Patient is taking Lantus as needed at home.  Not on Lantus here.  -SSI with reasonable control.  H/o atrial tachycardia-- not a fib?:  -Continue metoprolol and amiodarone -eliquis was stopped in June 2019 by [facility]  Thrombocytopenia [facility]): This is a chronic issue.  Platelet  104.  Mental status normal.  No bleeding tendency. -f/u by CBC in a.m.   PT/OT as he has been falling at home and recommend home health PT.  Family Communication/Anticipated D/C date and plan/Code Status    DVT prophylaxis: Lovenox ordered. Code Status: Full Code.  Family Communication: Discussed in detail with patient spouse at bedside. Disposition Plan: DC pending clinical improvement   Medical Consultants:    Nephrology  Vascular surgery     Subjective:   Seen postoperatively and has appropriate postop pain.  No other complaints reported.  Objective:    Vitals:   06/14/18 1455 06/14/18 1510 06/14/18 1525 06/14/18 1548  BP: (!) 110/59 114/63 (!) 105/56 121/61  Pulse: 60 60 60 60  Resp: 11 14 12 15   Temp:   (!) 97.2 F (36.2 C) 97.7 F (36.5 C)  TempSrc:    Oral  SpO2: 95% 95% 95% (!) 86%  Weight:      Height:        Intake/Output Summary (Last 24 hours) at 06/14/2018 1621 Last data filed at 06/14/2018 1534 Gross per 24 hour  Intake 1450 ml  Output 3400 ml  Net -1950 ml   Filed Weights   06/13/18 1729 06/13/18 2124 06/14/18 0920  Weight: 78 kg 78 kg 78 kg    Exam: General exam: Pleasant young male lying propped up in bed with some painful distress. Respiratory system: Clear to auscultation.  No increased work of breathing.  Stable. Cardiovascular system: S1 and S2 heard, RRR.  No JVD, murmurs or pedal edema. CNS: Alert and oriented: No focal neurological deficits. Extremities: Symmetric 5 x 5 power. GI: Abdomen is nondistended, soft and nontender.  Normal bowel sounds heard. Extremities: Left groin and lower thigh postop dressing clean and dry. Chronic skin changes b/l LE Chronic un-stagable ulcer on left heel and great toe  Data Reviewed:   I have personally reviewed following labs and imaging studies:  Labs: Labs show the following:   Basic Metabolic Panel: Recent Labs  Lab 06/08/18 0023 06/11/18 1633 06/12/18 0536 06/13/18 1430  NA 135 133* 134* 132*  K 4.3 4.2 3.7 3.9  CL 96* 91* 92* 92*  CO2 28 28 30 26   GLUCOSE 156* 124* 108* 113*  BUN 40* 27* 31* 41*  CREATININE 5.46* 4.22* 4.63* 5.90*  CALCIUM 9.6 9.4 9.4 9.0  PHOS  --   --    --  3.7   GFR Estimated Creatinine Clearance: 15.3 mL/min (A) (by C-G formula based on SCr of 5.9 mg/dL (H)). Liver Function Tests: Recent Labs  Lab 06/08/18 0023 06/11/18 1633 06/13/18 1430  AST 21 30  --   ALT 18 17  --   ALKPHOS 125 155*  --   BILITOT 1.4* 1.8*  --   PROT 6.6 6.9  --   ALBUMIN 2.9* 2.8* 2.4*    CBC: Recent Labs  Lab 06/08/18 0023 06/11/18 1633 06/12/18 0536 06/13/18 1440  WBC 5.3 8.3 9.0 7.7  NEUTROABS 3.8 6.1  --   --   HGB 11.1* 12.0* 10.7* 13.4  HCT 36.3* 40.5 34.7* 44.1  MCV 84.8 83.9 83.2 81.8  PLT 120* 104* 101* 92*   CBG: Recent Labs  Lab 06/14/18 0709 06/14/18 0859 06/14/18 1356 06/14/18 1518 06/14/18 1614  GLUCAP 133* 111* 70 72 80    Microbiology Recent Results (from the past 240 hour(s))  Blood culture (routine x 2)     Status: None (Preliminary result)   Collection Time: 06/11/18  4:52  PM  Result Value Ref Range Status   Specimen Description BLOOD LEFT HAND  Final   Special Requests   Final    BOTTLES DRAWN AEROBIC AND ANAEROBIC Blood Culture adequate volume   Culture   Final    NO GROWTH 3 DAYS Performed at [facility], [address], [place], [place] [address]    Report Status PENDING  Incomplete  Blood culture (routine x 2)     Status: None (Preliminary result)   Collection Time: 06/11/18  5:01 PM  Result Value Ref Range Status   Specimen Description BLOOD LEFT WRIST  Final   Special Requests   Final    BOTTLES DRAWN AEROBIC AND ANAEROBIC Blood Culture results may not be optimal due to an inadequate volume of blood received in culture bottles   Culture   Final    NO GROWTH 3 DAYS Performed at [facility], [place] [address] [place], [place] [address]    Report Status PENDING  Incomplete  MRSA PCR Screening     Status: Abnormal   Collection Time: 06/12/18  7:52 AM  Result Value Ref Range Status   MRSA by PCR POSITIVE (A) NEGATIVE Final    Comment:        The GeneXpert MRSA Assay (FDA approved for  NASAL specimens only), is one component of a comprehensive MRSA colonization surveillance program. It is not intended to diagnose MRSA infection nor to guide or monitor treatment for MRSA infections. RESULT CALLED TO, READ BACK BY AND VERIFIED WITH: Lurline Hare RN 9:45 06/12/18 ([email or web address]) Performed at [facility], [place] [address] [place], [place] [address]   Aerobic/Anaerobic Culture (surgical/deep wound)     Status: None (Preliminary result)   Collection Time: 06/14/18 10:55 AM  Result Value Ref Range Status   Specimen Description WOUND LEFT GROIN  Final   Special Requests PATIENT ON FOLLOWING VANC  Final   Gram Stain   Final    FEW WBC PRESENT, PREDOMINANTLY PMN NO ORGANISMS SEEN Performed at [facility], [place] [address], [place], [place] [address]    Culture PENDING  Incomplete   Report Status PENDING  Incomplete    Procedures and diagnostic studies:  No results found.  Medications:   . sodium chloride   Intravenous Once  . amiodarone  200 mg Oral BID  . aspirin EC  81 mg Oral Daily  . atorvastatin  40 mg Oral BID  . calcitRIOL  0.5 mcg Oral Q M,W,F-HD  . collagenase  1 application Topical Daily  . fentaNYL      . ferric citrate  420 mg Oral TID WC  . FLUoxetine  20 mg Oral Daily  . furosemide  40 mg Oral Once per day on Sun Tue Thu Sat  . gabapentin  300 mg Oral QHS  . HYDROmorphone      . insulin aspart  0-5 Units Subcutaneous QHS  . insulin aspart  0-9 Units Subcutaneous TID WC  . levalbuterol  0.63 mg Inhalation BID  . lidocaine-prilocaine  1 application Topical Q M,W,F-HD  . metoprolol tartrate  12.5 mg Oral BID  . multivitamin  1 tablet Oral QHS  . mupirocin ointment   Nasal BID  . nicotine  21 mg Transdermal Daily  . pantoprazole  40 mg Oral Daily  . umeclidinium bromide  1 puff Inhalation Daily  . vancomycin  125 mg Oral Q6H   Continuous Infusions: . sodium chloride 10 mL/hr at 06/14/18 0938  .  vancomycin       LOS: 2 days   Vernell Leep  [facility]   How to contact the [facility] Attending or Consulting provider [place] or covering provider during after hours [place], for this patient?  1. Check the care team in [facility] and look for a) attending/consulting TRH provider listed and b) the [facility] team listed 2. Log into [email or web address] and use [place]'s universal password to access. If you do not have the password, please contact the hospital operator. 3. Locate the [facility] provider you are looking for under [facility] and page to a number that you can be directly reached. 4. If you still have difficulty reaching the provider, please page the [facility] (Director on Call) for the Hospitalists listed on amion for assistance.  06/14/2018, 4:21 PM

## 2018-06-14 NOTE — Progress Notes (Signed)
OT Cancellation Note  Patient Details Name: Jose Sellers MRN: [id number] DOB: [birth year]   Cancelled Treatment:    Reason Eval/Treat Not Completed: Patient at procedure or test/ unavailable. Pt on his way to OR for debridement of his popliteal wound and I&D of his groin. Plan to reattempt post surgery.   Tyrone Schimke, OT Acute Rehabilitation Services Pager: [phone] Office: [phone]  06/14/2018, 8:34 AM

## 2018-06-14 NOTE — Progress Notes (Signed)
Left groin ABD pad was almost saturated.  RN changed ABD pad and retaped.  Will continue to monitor.

## 2018-06-14 NOTE — Interval H&P Note (Signed)
History and Physical Interval Note:  06/14/2018 9:47 AM  Jose Sellers  has presented today for surgery, with the diagnosis of POST OP WOUND INFECTION LEFT LEG.  The various methods of treatment have been discussed with the patient and family. After consideration of risks, benefits and other options for treatment, the patient has consented to  Procedure(s): IRRIGATION AND DEBRIDEMENT LEFT GROIN WOUND AND LEFT POPLITEAL WOUND. (Left) REMOVAL OF INFECTED BYPASS GRAFT FEMORAL-POPLITEAL ARTERY (Left) as a surgical intervention.  The patient's history has been reviewed, patient examined, no change in status, stable for surgery.  I have reviewed the patient's chart and labs.  Questions were answered to the patient's satisfaction.     Curt Jews

## 2018-06-14 NOTE — Anesthesia Procedure Notes (Signed)
Procedure Name: Intubation Date/Time: 06/14/2018 10:29 AM Performed by: Imagene Riches, CRNA Pre-anesthesia Checklist: Patient identified, Emergency Drugs available, Suction available and Patient being monitored Patient Re-evaluated:Patient Re-evaluated prior to induction Oxygen Delivery Method: Circle System Utilized Preoxygenation: Pre-oxygenation with 100% oxygen Induction Type: IV induction and Rapid sequence Laryngoscope Size: Miller and 3 Grade View: Grade I Tube type: Oral Tube size: 7.5 mm Number of attempts: 1 Airway Equipment and Method: Stylet and Oral airway Placement Confirmation: ETT inserted through vocal cords under direct vision,  positive ETCO2 and breath sounds checked- equal and bilateral Secured at: 23 cm Tube secured with: Tape Dental Injury: Teeth and Oropharynx as per pre-operative assessment

## 2018-06-14 NOTE — Anesthesia Preprocedure Evaluation (Signed)
Anesthesia Evaluation  Patient identified by MRN, date of birth, ID band Patient awake    Reviewed: Allergy & Precautions, NPO status , Patient's Chart, lab work & pertinent test results  Airway Mallampati: II  TM Distance: >3 FB Neck ROM: Full    Dental  (+) Edentulous Upper   Pulmonary shortness of breath, asthma , sleep apnea , COPD (uses O2 at times),  COPD inhaler, Current Smoker,  CXR reviewed   Pulmonary exam normal breath sounds clear to auscultation       Cardiovascular hypertension, Pt. on home beta blockers + CAD, + Past MI, + CABG, + Peripheral Vascular Disease and +CHF  Normal cardiovascular exam+ dysrhythmias Atrial Fibrillation + Cardiac Defibrillator  Rhythm:Regular Rate:Normal  Cardiologist - Dr. Curly Rim  Cleared as high risk by cardiologist on 04/26/18.  EF 25% by echo 02/2018)    EF: of 30-35%   Neuro/Psych PSYCHIATRIC DISORDERS Anxiety Depression  Neuromuscular disease    GI/Hepatic Neg liver ROS, hiatal hernia, GERD  Medicated and Controlled,  Endo/Other  diabetes, Insulin DependentHypothyroidism   Renal/GU ESRF and DialysisRenal diseaseOn HD M,W,F     Musculoskeletal  (+) Arthritis ,   Abdominal   Peds  Hematology  (+) Blood dyscrasia, anemia ,   Anesthesia Other Findings PERIPHERAL VASCULAR DISEASE  Reproductive/Obstetrics                             Anesthesia Physical  Anesthesia Plan  ASA: IV  Anesthesia Plan: General   Post-op Pain Management:    Induction: Intravenous  PONV Risk Score and Plan: 2 and Ondansetron, Dexamethasone and Treatment may vary due to age or medical condition  Airway Management Planned: Oral ETT  Additional Equipment: Arterial line  Intra-op Plan:   Post-operative Plan: Possible Post-op intubation/ventilation  Informed Consent: I have reviewed the patients History and Physical, chart, labs and discussed the  procedure including the risks, benefits and alternatives for the proposed anesthesia with the patient or authorized representative who has indicated his/her understanding and acceptance.     Dental advisory given  Plan Discussed with: CRNA  Anesthesia Plan Comments: (Reviewed PAT note by Karoline Caldwell, PA-C )        Anesthesia Quick Evaluation

## 2018-06-14 NOTE — Progress Notes (Signed)
PT Cancellation Note  Patient Details Name: Jose Sellers MRN: [id number] DOB: 08/30/1960   Cancelled Treatment:    Reason Eval/Treat Not Completed: Patient at procedure or test/unavailable. Pt on his way to OR for debridement of his popliteal wound and I&D of his groin. Will continue to follow.   Thelma Comp 06/14/2018, 9:01 AM   Rolinda Roan, PT, DPT Acute Rehabilitation Services Pager: [phone] Office: [phone]

## 2018-06-14 NOTE — Progress Notes (Addendum)
[facility] Progress Note   Subjective:  Seen in room. Wife at bedside. No issues with dialysis yesterday using AVF. For vascular procedure in OR today.   Objective Vitals:   06/13/18 2124 06/14/18 0551 06/14/18 0920 06/14/18 0933  BP: 128/61 126/74  120/63  Pulse: 67 66  61  Resp: 18 18    Temp: 98.2 F (36.8 C) 98.2 F (36.8 C)    TempSrc: Oral Oral    SpO2: 97% 97%    Weight: 78 kg  78 kg   Height:   6' (1.829 m)     Physical Exam General: Chronically ill appearing male NAD Lungs: Barrel chest Decreased air movement, occasional wheeze  Heart: RRR with S1 S2 Abdomen: soft NT + BS Lower extremities: Trace  LE edema  Dialysis Access: R forearm AVF +bruit; R IJ TDC    Weight change: -4.4 kg   Additional Objective Labs: Basic Metabolic Panel: Recent Labs  Lab 06/11/18 1633 06/12/18 0536 06/13/18 1430  NA 133* 134* 132*  K 4.2 3.7 3.9  CL 91* 92* 92*  CO2 28 30 26   GLUCOSE 124* 108* 113*  BUN 27* 31* 41*  CREATININE 4.22* 4.63* 5.90*  CALCIUM 9.4 9.4 9.0  PHOS  --   --  3.7   CBC: Recent Labs  Lab 06/08/18 0023 06/11/18 1633 06/12/18 0536 06/13/18 1440  WBC 5.3 8.3 9.0 7.7  NEUTROABS 3.8 6.1  --   --   HGB 11.1* 12.0* 10.7* 13.4  HCT 36.3* 40.5 34.7* 44.1  MCV 84.8 83.9 83.2 81.8  PLT 120* 104* 101* 92*   Blood Culture    Component Value Date/Time   SDES BLOOD LEFT WRIST 06/11/2018 1701   SPECREQUEST  06/11/2018 1701    BOTTLES DRAWN AEROBIC AND ANAEROBIC Blood Culture results may not be optimal due to an inadequate volume of blood received in culture bottles   CULT  06/11/2018 1701    NO GROWTH 2 DAYS Performed at [facility], [address] [place], [place] [address]    REPTSTATUS PENDING 06/11/2018 1701     Medications: . [MAR Hold] ferric gluconate (FERRLECIT/NULECIT) IV Stopped (06/13/18 1629)  . lactated ringers    . [MAR Hold] vancomycin     . [MAR Hold] amiodarone  200 mg Oral BID  . [MAR Hold] aspirin EC   81 mg Oral Daily  . [MAR Hold] atorvastatin  40 mg Oral BID  . [MAR Hold] calcitRIOL  0.5 mcg Oral Q M,W,F-HD  . [MAR Hold] collagenase  1 application Topical Daily  . [MAR Hold] ferric citrate  420 mg Oral TID WC  . [MAR Hold] FLUoxetine  20 mg Oral Daily  . [MAR Hold] furosemide  40 mg Oral Once per day on Sun Tue Thu Sat  . [MAR Hold] gabapentin  300 mg Oral QHS  . [MAR Hold] insulin aspart  0-5 Units Subcutaneous QHS  . [MAR Hold] insulin aspart  0-9 Units Subcutaneous TID WC  . [MAR Hold] levalbuterol  0.63 mg Inhalation BID  . [MAR Hold] lidocaine-prilocaine  1 application Topical Q M,W,F-HD  . [MAR Hold] metoprolol tartrate  12.5 mg Oral BID  . [MAR Hold] multivitamin  1 tablet Oral QHS  . [MAR Hold] mupirocin ointment   Nasal BID  . [MAR Hold] nicotine  21 mg Transdermal Daily  . [MAR Hold] pantoprazole  40 mg Oral Daily  . [MAR Hold] umeclidinium bromide  1 puff Inhalation Daily  . [MAR Hold] vancomycin  125 mg Oral Q6H     [place] MWF  4h   350/1.5   78kg    2/2.25 bath   P2   R AVF/ TDC still in   Hep none Venofer 100 mg IV x 10 (until 3/23) Calcitriol 0.5 mcg PO TIW   Assessment/Plan: 1. L groin wound erythema/ swelling - possible infection , low -grade temp. On Vanc per primary. Blood cultures neg to date. VVS following - going to OR this morning for debridement  2. ESRD -  MWF. Next HD 3/20. Advised by OP unit that may need fistulogram d/t cannulation issus. Still has TDC in. Will monitor here. No issues using AVF on 3/18 - Try 15g next HD.  3. Hypertension/volume  - BP acceptable. Now at EDW.   4. Anemia  - Hgb 10.7. No ESA needs. Hold IV Fe while getting antibiotics  5. Metabolic bone disease -  Continue VDRA/binders 6. Nutrition - Prot supp for low albumin  7. PAD - s/p left femoral endarterectomy with patch angioplasty and left femoral to above-the-knee popliteal bypass with PTFE graft by Dr.Early on 05/04/2018   Lynnda Child PA-C [facility] Pager [phone] 06/14/2018,9:36 AM  LOS: 2 days   Pt seen, examined and agree w A/P as above.  [facility] 06/14/2018, 10:56 AM

## 2018-06-14 NOTE — Progress Notes (Signed)
Notified Samantha, PA that the pt's left pedal and left posterior tibial are absent and peroneal is dopplerable. Also updated that pt's foot is cool. No new orders. Will continue to monitor pt.

## 2018-06-14 NOTE — Op Note (Signed)
° ° °  OPERATIVE REPORT  DATE OF SURGERY: 06/14/2018  PATIENT: Jose Sellers, 58 y.o. male MRN: [id number]  DOB: [birth year]  PRE-OPERATIVE DIAGNOSIS: Infected left femoral to popliteal bypass  POST-OPERATIVE DIAGNOSIS:  Same  PROCEDURE: Removal of infected left femoral to popliteal Gore-Tex bypass and Dacron patch angioplasty of common and profunda femoris artery and vein patch angioplasty of common femoral and profundus femoris artery and above-knee popliteal artery  SURGEON:  Curt Jews, M.D.  PHYSICIAN ASSISTANT: Liana Crocker, PA-C, Matt Eveland, PA-C  ANESTHESIA: General  EBL: per anesthesia record  Total I/O In: 750 [I.V.:500; IV Piggyback:250] Out: 400 [Blood:400]  BLOOD ADMINISTERED: none  DRAINS: none  SPECIMEN: none  COUNTS CORRECT:  YES  PATIENT DISPOSITION:  PACU - hemodynamically stable  PROCEDURE DETAILS: Patient was taken the operating placed supine position where the area of the left groin left leg prepped draped you sterile fashion.  He is proximally 6-week status post left common femoral endarterectomy and Dacron patch angioplasty onto his profunda.  He presented with fluctuance in his groin and draining from his popliteal with presumed graft infection.  The incision was made over the prior scar in the groin and purulence was present.  There was some lymphocele material to and some frank pus.  This was sent for culture and sensitivity.  The graft was obviously involved.  There was a great deal of scarring in the common femoral external iliac superficial and femoris and profundus femoris arteries were isolated.  Next attention was turned to the above-knee popliteal incision.  This was opened as well.  There was drainage in this tract right down to the graft anastomosis to the above-knee popliteal artery.  There was saphenous vein that was isolated in the groin.  This is been seen in his initial surgery and ended where he had a vein harvest in the mid thigh.  The  saphenous vein was removed in its entirety from the groin to mid thigh for a patch angioplasty.  Patient was given 8000 intravenous heparin affect circulation time the external iliac, superficial femoral and profundus femoris arteries were occluded.  The Gore-Tex graft and Dacron patch were removed in its entirety.  The saphenous vein was opened longitudinally and was sewn as a patch angioplasty with a running 5-0 Prolene suture.  Anastomosis was tested and several additional sutures were required for hemostasis.  Next attention was turned to the above-knee popliteal incision.  A pneumatic tourniquet was placed around the thigh and the leg was elevated and exsanguinated with an Esmarch tourniquet.  The pneumatic tourniquet was inflated and the Gore-Tex graft was removed from the above-knee popliteal artery.  The arteriotomy was patched with a saphenous vein patch running 5-0 Prolene suture.  This anastomosis was tested after the tourniquet was deflated and was adequate.  The patient was given 50 mg protamine reverse heparin.  Wounds irrigated with saline.  Hemostasis talus cautery.  The wounds were closed with 2-0 Vicryl in the deep tissue over the arteries.  The superficial tissue was left open and was packed with saline.  Sterile dressing was applied and the patient was transferred to the recovery room in stable condition   Jose Sellers, M.D., [facility] 06/14/2018 1:43 PM

## 2018-06-15 ENCOUNTER — Encounter (HOSPITAL_COMMUNITY): Payer: Self-pay | Admitting: Vascular Surgery

## 2018-06-15 LAB — RENAL FUNCTION PANEL
ANION GAP: 12 (ref 5–15)
Albumin: 2.3 g/dL — ABNORMAL LOW (ref 3.5–5.0)
BUN: 35 mg/dL — ABNORMAL HIGH (ref 6–20)
CO2: 24 mmol/L (ref 22–32)
Calcium: 8.5 mg/dL — ABNORMAL LOW (ref 8.9–10.3)
Chloride: 94 mmol/L — ABNORMAL LOW (ref 98–111)
Creatinine, Ser: 5.76 mg/dL — ABNORMAL HIGH (ref 0.61–1.24)
GFR calc non Af Amer: 10 mL/min — ABNORMAL LOW (ref >60)
GFR, EST AFRICAN AMERICAN: 12 mL/min — AB (ref >60)
Glucose, Bld: 119 mg/dL — ABNORMAL HIGH (ref 70–99)
Phosphorus: 3.4 mg/dL (ref 2.5–4.6)
Potassium: 4.6 mmol/L (ref 3.5–5.1)
Sodium: 130 mmol/L — ABNORMAL LOW (ref 135–145)

## 2018-06-15 LAB — CBC
HCT: 29.4 % — ABNORMAL LOW (ref 39.0–52.0)
HEMOGLOBIN: 8.9 g/dL — AB (ref 13.0–17.0)
MCH: 25.4 pg — ABNORMAL LOW (ref 26.0–34.0)
MCHC: 30.3 g/dL (ref 30.0–36.0)
MCV: 84 fL (ref 80.0–100.0)
NRBC: 0 % (ref 0.0–0.2)
Platelets: 118 10*3/uL — ABNORMAL LOW (ref 150–400)
RBC: 3.5 MIL/uL — ABNORMAL LOW (ref 4.22–5.81)
RDW: 16.7 % — ABNORMAL HIGH (ref 11.5–15.5)
WBC: 10.3 10*3/uL (ref 4.0–10.5)

## 2018-06-15 LAB — GLUCOSE, CAPILLARY
Glucose-Capillary: 108 mg/dL — ABNORMAL HIGH (ref 70–99)
Glucose-Capillary: 120 mg/dL — ABNORMAL HIGH (ref 70–99)
Glucose-Capillary: 132 mg/dL — ABNORMAL HIGH (ref 70–99)
Glucose-Capillary: 173 mg/dL — ABNORMAL HIGH (ref 70–99)

## 2018-06-15 MED ORDER — ACETAMINOPHEN 325 MG PO TABS
325.0000 mg | ORAL_TABLET | Freq: Once | ORAL | Status: AC
  Administered 2018-06-15: 325 mg via ORAL
  Filled 2018-06-15: qty 1

## 2018-06-15 MED ORDER — SODIUM CHLORIDE 0.9 % IV BOLUS
250.0000 mL | Freq: Once | INTRAVENOUS | Status: AC
  Administered 2018-06-15: 250 mL via INTRAVENOUS

## 2018-06-15 MED ORDER — PENTAFLUOROPROP-TETRAFLUOROETH EX AERO: 1.0000 "application " | INHALATION_SPRAY | CUTANEOUS | Status: DC | PRN

## 2018-06-15 MED ORDER — IBUPROFEN 400 MG PO TABS: 400.0000 mg | ORAL_TABLET | Freq: Once | ORAL | Status: DC

## 2018-06-15 MED ORDER — LIDOCAINE-PRILOCAINE 2.5-2.5 % EX CREA
1.0000 "application " | TOPICAL_CREAM | CUTANEOUS | Status: DC | PRN
  Filled 2018-06-15: qty 5

## 2018-06-15 MED ORDER — HEPARIN SODIUM (PORCINE) 1000 UNIT/ML DIALYSIS
1000.0000 [IU] | INTRAMUSCULAR | Status: DC | PRN
  Filled 2018-06-15: qty 1

## 2018-06-15 MED ORDER — SODIUM CHLORIDE 0.9 % IV SOLN: 100.0000 mL | INTRAVENOUS | Status: DC | PRN

## 2018-06-15 MED ORDER — ALTEPLASE 2 MG IJ SOLR: 2.0000 mg | Freq: Once | INTRAMUSCULAR | Status: DC | PRN

## 2018-06-15 MED ORDER — LIDOCAINE HCL (PF) 1 % IJ SOLN: 5.0000 mL | INTRAMUSCULAR | Status: DC | PRN

## 2018-06-15 NOTE — Progress Notes (Signed)
Pharmacy Antibiotic Note  Jose Sellers is a 58 y.o. male with ESRD with scheduled HD MWF admitted on 06/11/2018 with cellulitis.  Pharmacy has been consulted for IV vancomycin dosing. Patient also receiving PO vancomycin per primary for C.diff prophylaxis  while on antibiotics and for 1 week after.   Preliminary wound cx results: staph aureus - susceptibilities pending  Plan: Vancomycin 1000mg  IV with HD on MWF F/u HD schedule, cxs, LOT/de-escalation, and levels prn   Height: 6' (182.9 cm) Weight: 174 lb 9.7 oz (79.2 kg) IBW/kg (Calculated) : 77.6  Temp (24hrs), Avg:99.2 F (37.3 C), Min:97.2 F (36.2 C), Max:104.2 F (40.1 C)  Recent Labs  Lab 06/11/18 1633 06/12/18 0536 06/13/18 1430 06/13/18 1440 06/15/18 0657  WBC 8.3 9.0  --  7.7 10.3  CREATININE 4.22* 4.63* 5.90*  --  5.76*  LATICACIDVEN 1.4  --   --   --   --     Estimated Creatinine Clearance: 15.7 mL/min (A) (by C-G formula based on SCr of 5.76 mg/dL (H)).    Allergies  Allergen Reactions  . Albuterol Palpitations    SVT  . Prednisone Shortness Of Breath    Wife stated that patient develops breathing issues.  . Fluticasone Furoate-Vilanterol Palpitations  . Morphine Rash    Antimicrobials this admission: PO Vanc 3/17>> IV Vanc 3/16>>  Dose adjustments this admission: n/a  Microbiology results: 3/16 Bcx: ngtd 3/16 MRSA pcr + 3/19 Wound cx: staph  -susc pending  Thank you for allowing pharmacy to be a part of this patient's care.  Harrietta Guardian, PharmD PGY1 Pharmacy Resident 06/15/2018    2:05 PM Please check AMION for all [place] numbers

## 2018-06-15 NOTE — Progress Notes (Signed)
   06/15/18 1100  OT Visit Information  Last OT Received On 06/15/18  Reason Eval/Treat Not Completed Patient at procedure or test/ unavailable. HD.  History of Present Illness Patient is 58 y/o male presenting to hospital with left groin wound infection. Patient recently s/p left femoral endarterectomy with patch angioplasty and left femoral to above-the-knee popliteal bypass with PTFE graft on 05/04/18. PMH includes CAD, CHF, COPD, ESRD on HD MWF, GERD, HTN, DMII, AICD, PAD, afib.    Plan to reattempt, hopefully this afternoon.  Tyrone Schimke, OT Acute Rehabilitation Services Pager: [phone] Office: [phone]

## 2018-06-15 NOTE — Progress Notes (Addendum)
Progress Note    Jose Sellers  RKY:[id number] DOB: March 10, 1961  DOA: 06/11/2018 PCP: Raina Mina., MD    Brief Narrative:     Medical records reviewed and are as summarized below:  Jose Sellers is an 58 y.o. male with medical history significant of A. Fib not on AC, s/p AICD, asthma, CAD, sCHF with EF 25%, COPD, DM, ESRD-HD (MWF), PAD hyperlipidemia, chronic thrombocytopenia, who presents with left groin erythema and swelling, open wound in left medial groin-- sent in from the vascular office routine follow up.  Assessment/Plan:   Principal Problem:   Wound infection Active Problems:   CAD (coronary artery disease)   Chronic systolic congestive heart failure (HCC)   COPD (chronic obstructive pulmonary disease) (HCC)   ESRD on dialysis (HCC)   GERD (gastroesophageal reflux disease)   Hypercholesterolemia   Essential hypertension   Tobacco use   Uncontrolled type 2 diabetes mellitus with diabetic polyneuropathy, with long-term current use of insulin (HCC)   AICD (automatic cardioverter/defibrillator) present   PAD (peripheral artery disease) (HCC)   Open wound of left thigh   Chronic atrial fibrillation   Cellulitis of left groin   Thrombocytopenia (HCC)   Left groin pain  Left groin Staphylococcus aureus wound infection/infected left femoropopliteal bypass  - Empiric antimicrobial treatment with vancomycin - Dr. Eliseo Squires discussed with ID his recent c diff infection (June) and was recommended oral vancomaycin 125mg  QID for while on antibiotics and for 1 week after - PRN Zofran for nausea, percocet for pain - Blood cultures x 2, negative to date -Vascular surgery consulted and patient was taken to the OR and underwent removal of infected left femoropopliteal bypass and removal of infected femoral endarterectomy patch, had placement of a saphenous vein patch from his common femoral to his profunda femoris artery 3/19. -Management per vascular surgery. -Spiked fever of  104 rectally last night, likely related to recent infected graft and related surgery.  Fevers have defervesced since.  If he has recurrence of fevers, may need further evaluation and consider discussing with ID.  May still need to discuss with ID regarding duration of antibiotics.  PAD:  -As per vascular surgery follow-up, patient underwent extensive endarterectomy in his left common femoral with Dacron patch angioplasty and with Gore-Tex bypass of the dacryon to the above knee popliteal artery on 2/7.  His foot is well perfused and he does have healing of his toe ulcer. -Concern regarding prosthetic graft infection and underwent procedure as noted above.  CAD (coronary artery disease): no CP.  -continue aspirin, Lipitor, metoprolol -As needed nitroglycerin -No anginal symptoms.  ESRD on HD (MWF):  pt had had a dialysis 3/17.   -Nephrology follow-up appreciated.  MWF HD.  As per nephrology, advised by outpatient unit that he may need fistulogram due to cannulation issues.  Still has TDC in. Seen at HD 3/20.  Reportedly no issues using aVF for HD on 3/18 and on 3/20.  Chronic systolic congestive heart failure ([place]):  2D echo on 10/31/2017 showed EF 25-30%.  Patient does not have leg edema or JVD.  CHF seems to be compensated. -Continue home dose Lasix -Volume management per renal by dialysis  GERD: -Protonix  COPD (chronic obstructive pulmonary disease) ([place]):  - Stable.  No clinical bronchospasm. -Bronchodilators  Hypercholesterolemia: -lipitor  HTN:  -Continue home medications: Metoprolol, Lasix, -IV hydralazine prn -Controlled.  Tobacco abuse: -Nicotine patch  Uncontrolled type 2 diabetes mellitus with diabetic polyneuropathy, with long-term current use of insulin,  with ESRD: Last A1c 6.0 on 05/01/18, well controled. Patient is taking Lantus as needed at home.  Not on Lantus here.  -SSI with reasonable control.  H/o atrial tachycardia-- not a fib?:  -Continue  metoprolol and amiodarone -eliquis was stopped in June 2019 by [facility]  Thrombocytopenia [facility]): This is a chronic issue.  Platelet 104.  Mental status normal.  No bleeding tendency. -Better today.  Postop acute blood loss anemia Baseline hemoglobin probably in the 10-11 range.  Now down to 8.9.  Follow CBC in a.m.  Transfuse if hemoglobin 7 g or less.   PT/OT as he has been falling at home and recommend home health PT.  Family Communication/Anticipated D/C date and plan/Code Status   DVT prophylaxis: Lovenox ordered. Code Status: Full Code.  Family Communication: None at bedside today. Disposition Plan: DC pending clinical improvement   Medical Consultants:    Nephrology  Vascular surgery     Subjective:   Overnight events noted, high fever.  Feels slightly better this morning, still with appropriate left lower extremity postop pain.  Has chronic unchanged cough.  No dyspnea.  Objective:    Vitals:   06/15/18 1130 06/15/18 1155 06/15/18 1300 06/15/18 1400  BP: (!) 110/57 (!) 114/56 (!) 111/58 (!) 111/58  Pulse: 70 71 72 71  Resp:  18 16   Temp:  99.2 F (37.3 C) 98.9 F (37.2 C)   TempSrc:  Oral Oral   SpO2:  97% 97%   Weight:  79.2 kg    Height:        Intake/Output Summary (Last 24 hours) at 06/15/2018 1707 Last data filed at 06/15/2018 1155 Gross per 24 hour  Intake 450 ml  Output 2524 ml  Net -2074 ml   Filed Weights   06/14/18 0920 06/15/18 0729 06/15/18 1155  Weight: 78 kg 81.7 kg 79.2 kg    Exam: General exam: Pleasant young male lying propped up in bed, looks better than he did last evening, mild painful distress. Respiratory system: Clear to auscultation.  No increased work of breathing.  Stable. Cardiovascular system: S1 and S2 heard, RRR.  No JVD, murmurs or pedal edema.  Telemetry personally reviewed: Sinus rhythm with BBB morphology, low voltage. CNS: Alert and oriented: No focal neurological deficits. GI: Abdomen is nondistended, soft and  nontender.  Normal bowel sounds heard. Extremities: Left groin and lower thigh postop dressing clean and dry. Chronic skin changes b/l LE Chronic un-stagable ulcer on left heel and great toe  Data Reviewed:   I have personally reviewed following labs and imaging studies:  Labs: Labs show the following:   Basic Metabolic Panel: Recent Labs  Lab 06/11/18 1633 06/12/18 0536 06/13/18 1430 06/15/18 0657  NA 133* 134* 132* 130*  K 4.2 3.7 3.9 4.6  CL 91* 92* 92* 94*  CO2 28 30 26 24   GLUCOSE 124* 108* 113* 119*  BUN 27* 31* 41* 35*  CREATININE 4.22* 4.63* 5.90* 5.76*  CALCIUM 9.4 9.4 9.0 8.5*  PHOS  --   --  3.7 3.4   GFR Estimated Creatinine Clearance: 15.7 mL/min (A) (by C-G formula based on SCr of 5.76 mg/dL (H)). Liver Function Tests: Recent Labs  Lab 06/11/18 1633 06/13/18 1430 06/15/18 0657  AST 30  --   --   ALT 17  --   --   ALKPHOS 155*  --   --   BILITOT 1.8*  --   --   PROT 6.9  --   --  ALBUMIN 2.8* 2.4* 2.3*    CBC: Recent Labs  Lab 06/11/18 1633 06/12/18 0536 06/13/18 1440 06/15/18 0657  WBC 8.3 9.0 7.7 10.3  NEUTROABS 6.1  --   --   --   HGB 12.0* 10.7* 13.4 8.9*  HCT 40.5 34.7* 44.1 29.4*  MCV 83.9 83.2 81.8 84.0  PLT 104* 101* 92* 118*   CBG: Recent Labs  Lab 06/14/18 1518 06/14/18 1614 06/14/18 2118 06/15/18 0639 06/15/18 1301  GLUCAP 72 80 103* 108* 120*    Microbiology Recent Results (from the past 240 hour(s))  Blood culture (routine x 2)     Status: None (Preliminary result)   Collection Time: 06/11/18  4:52 PM  Result Value Ref Range Status   Specimen Description BLOOD LEFT HAND  Final   Special Requests   Final    BOTTLES DRAWN AEROBIC AND ANAEROBIC Blood Culture adequate volume   Culture   Final    NO GROWTH 4 DAYS Performed at [facility], [place] [address] [place], [place] [address]    Report Status PENDING  Incomplete  Blood culture (routine x 2)     Status: None (Preliminary result)   Collection Time:  06/11/18  5:01 PM  Result Value Ref Range Status   Specimen Description BLOOD LEFT WRIST  Final   Special Requests   Final    BOTTLES DRAWN AEROBIC AND ANAEROBIC Blood Culture results may not be optimal due to an inadequate volume of blood received in culture bottles   Culture   Final    NO GROWTH 4 DAYS Performed at [facility], [place] [address], [place], [place] [address]    Report Status PENDING  Incomplete  MRSA PCR Screening     Status: Abnormal   Collection Time: 06/12/18  7:52 AM  Result Value Ref Range Status   MRSA by PCR POSITIVE (A) NEGATIVE Final    Comment:        The GeneXpert MRSA Assay (FDA approved for NASAL specimens only), is one component of a comprehensive MRSA colonization surveillance program. It is not intended to diagnose MRSA infection nor to guide or monitor treatment for MRSA infections. RESULT CALLED TO, READ BACK BY AND VERIFIED WITH: Lurline Hare RN 9:45 06/12/18 ([email or web address]) Performed at [facility], [place] [address], [place], [place] [address]   Aerobic/Anaerobic Culture (surgical/deep wound)     Status: None (Preliminary result)   Collection Time: 06/14/18 10:55 AM  Result Value Ref Range Status   Specimen Description WOUND LEFT GROIN  Final   Special Requests PATIENT ON FOLLOWING VANC  Final   Gram Stain   Final    FEW WBC PRESENT, PREDOMINANTLY PMN NO ORGANISMS SEEN    Culture   Final    FEW STAPHYLOCOCCUS AUREUS SUSCEPTIBILITIES TO FOLLOW Performed at [facility], [place] [address] [place],  [address]    Report Status PENDING  Incomplete    Procedures and diagnostic studies:  No results found.  Medications:   . amiodarone  200 mg Oral BID  . aspirin EC  81 mg Oral Daily  . atorvastatin  40 mg Oral BID  . calcitRIOL  0.5 mcg Oral Q M,W,F-HD  . collagenase  1 application Topical Daily  . feeding supplement (PRO-STAT SUGAR FREE 64)  30 mL Oral BID  . ferric citrate  420 mg Oral TID WC  . FLUoxetine  20 mg Oral  Daily  . furosemide  40 mg Oral Once per day  on Sun Tue Thu Sat  . gabapentin  300 mg Oral QHS  . heparin injection (subcutaneous)  5,000 Units Subcutaneous Q12H  . insulin aspart  0-5 Units Subcutaneous QHS  . insulin aspart  0-9 Units Subcutaneous TID WC  . levalbuterol  0.63 mg Inhalation BID  . lidocaine-prilocaine  1 application Topical Q M,W,F-HD  . metoprolol tartrate  12.5 mg Oral BID  . multivitamin  1 tablet Oral QHS  . mupirocin ointment   Nasal BID  . nicotine  21 mg Transdermal Daily  . pantoprazole  40 mg Oral Daily  . umeclidinium bromide  1 puff Inhalation Daily  . vancomycin  125 mg Oral Q6H   Continuous Infusions: . sodium chloride 10 mL/hr at 06/14/18 0938  . vancomycin 1,000 mg (06/15/18 1110)     LOS: 3 days   Vernell Leep  [facility]   How to contact the [facility] Attending or Consulting provider [place] or covering provider during after hours [place], for this patient?  1. Check the care team in [facility] and look for a) attending/consulting TR[facility] provider listed and b) the [facility] team listed 2. Log into [email or web address] and use [place]'s universal password to access. If you do not have the password, please contact the hospital operator. 3. Locate the [facility] provider you are looking for under [facility] and page to a number that you can be directly reached. 4. If you still have difficulty reaching the provider, please page the [facility] (Director on Call) for the Hospitalists listed on amion for assistance.  06/15/2018, 5:07 PM

## 2018-06-15 NOTE — Progress Notes (Signed)
[facility] Progress Note   Subjective:  Seen on HD, pt w/o complaints.  Had surgery yesterday w/ removal of infected L fem-pop bypass graft.   Objective Vitals:   06/15/18 1100 06/15/18 1130 06/15/18 1155 06/15/18 1300  BP: (!) 111/55 (!) 110/57 (!) 114/56 (!) 111/58  Pulse: 69 70 71 72  Resp:   18 16  Temp:   99.2 F (37.3 C) 98.9 F (37.2 C)  TempSrc:   Oral Oral  SpO2:   97% 97%  Weight:   79.2 kg   Height:        Physical Exam General: Chronically ill appearing male NAD Lungs: Barrel chest Decreased air movement, occasional wheeze  Heart: RRR with S1 S2 Abdomen: soft NT + BS Lower extremities: Trace  LE edema , large dressing L groin postop Dialysis Access: R forearm AVF +bruit; R IJ TDC     Home meds  - aspirin 81/ atorvastatin 40 bid/ sl ntg prn  - amiodarone 200 bid/ metoprolol 12.5 bid  - insulin glargine 8- 12 ug qd/ insulin lispro 0-32 units ac tid  - esomeprazole 40 qd/ ferric citrate 420mg  ac  - fluoxetine 20 qd/ gabapentin 300 hs/ oxy IR 30mg  prn/ oxy-aceta prn  - furosemide 40mg  on non HD days  - tiotropium 18 ug qd/ levalbuterol 0.63 bid   - prn's/ vitamins    [place] MWF  4h   350/1.5   78kg    2/2.25 bath   P2   R AVF/ TDC still in   Hep none Venofer 100 mg IV x 10 (until 3/23) Calcitriol 0.5 mcg PO TIW   Assessment/Plan: 1. Infected fem-pop LLE graft - placed 05/04/18 for ischemic toe ulcer. SP removal yesterday 06/14/18. Per [facility]  2. ESRD - HD MWF. HD today. Advised by OP unit that may need fistulogram d/t cannulation issus. Has TDC in. No issues using AVF on 3/18 and today on 3/20 here, per RN worked "great" today.  3. Hypertension/volume  - BP acceptable. Up 1-2kg  4. Anemia  - Hgb 10.7. No ESA needs. Hold IV Fe while getting antibiotics  5. Metabolic bone disease -  Continue VDRA/binders 6. Nutrition - Prot supp for low albumin     [facility] 06/15/2018, 1:22 PM  Weight change: -3 kg   Additional  Objective Labs: Basic Metabolic Panel: Recent Labs  Lab 06/12/18 0536 06/13/18 1430 06/15/18 0657  NA 134* 132* 130*  K 3.7 3.9 4.6  CL 92* 92* 94*  CO2 30 26 24   GLUCOSE 108* 113* 119*  BUN 31* 41* 35*  CREATININE 4.63* 5.90* 5.76*  CALCIUM 9.4 9.0 8.5*  PHOS  --  3.7 3.4   CBC: Recent Labs  Lab 06/11/18 1633 06/12/18 0536 06/13/18 1440 06/15/18 0657  WBC 8.3 9.0 7.7 10.3  NEUTROABS 6.1  --   --   --   HGB 12.0* 10.7* 13.4 8.9*  HCT 40.5 34.7* 44.1 29.4*  MCV 83.9 83.2 81.8 84.0  PLT 104* 101* 92* 118*   Blood Culture    Component Value Date/Time   SDES WOUND LEFT GROIN 06/14/2018 1055   SPECREQUEST PATIENT ON FOLLOWING VANC 06/14/2018 1055   CULT  06/14/2018 1055    FEW STAPHYLOCOCCUS AUREUS SUSCEPTIBILITIES TO FOLLOW Performed at [facility], [place] [address], [place], [place] [address]    REPTSTATUS PENDING 06/14/2018 1055     Medications: . sodium chloride 10 mL/hr at 06/14/18 0938  . vancomycin 1,000 mg (  06/15/18 1110)   . amiodarone  200 mg Oral BID  . aspirin EC  81 mg Oral Daily  . atorvastatin  40 mg Oral BID  . calcitRIOL  0.5 mcg Oral Q M,W,F-HD  . collagenase  1 application Topical Daily  . feeding supplement (PRO-STAT SUGAR FREE 64)  30 mL Oral BID  . ferric citrate  420 mg Oral TID WC  . FLUoxetine  20 mg Oral Daily  . furosemide  40 mg Oral Once per day on Sun Tue Thu Sat  . gabapentin  300 mg Oral QHS  . heparin injection (subcutaneous)  5,000 Units Subcutaneous Q12H  . insulin aspart  0-5 Units Subcutaneous QHS  . insulin aspart  0-9 Units Subcutaneous TID WC  . levalbuterol  0.63 mg Inhalation BID  . lidocaine-prilocaine  1 application Topical Q M,W,F-HD  . metoprolol tartrate  12.5 mg Oral BID  . multivitamin  1 tablet Oral QHS  . mupirocin ointment   Nasal BID  . nicotine  21 mg Transdermal Daily  . pantoprazole  40 mg Oral Daily  . umeclidinium bromide  1 puff Inhalation Daily  . vancomycin  125 mg Oral Q6H

## 2018-06-15 NOTE — Progress Notes (Signed)
   06/15/18 1300  PT Visit Information  Last PT Received On 06/15/18  Reason Eval/Treat Not Completed Patient at procedure or test/unavailable    Pt is unable to be seen due to HD all AM, and will reattempt at another time.  Mee Hives, PT MS Acute Rehab Dept. Number: [place]

## 2018-06-15 NOTE — Progress Notes (Signed)
Pt had a rectal temperature of 104.2 F this AM. The on call physician was paged and placed several orders. Give a one time 250 mL bolus of normal saline IV. Cool down the room and place ice on axillary and groin areas. And a one time Tylenol 325 mg by mouth.  After interventions, the temp has come down to 100.2 F. Will continue to monitor.  Lupita Dawn, RN

## 2018-06-15 NOTE — Progress Notes (Signed)
Patient ID: Jose Sellers, male   DOB: December 03, 1960, 58 y.o.   MRN: [id number]  Progress Note    06/15/2018 2:17 PM 1 Day Post-Op  Subjective: Comfortable.  Having some pain in his groin and popliteal incision but denies foot pain   Vitals:   06/15/18 1300 06/15/18 1400  BP: (!) 111/58 (!) 111/58  Pulse: 72 71  Resp: 16   Temp: 98.9 F (37.2 C)   SpO2: 97%    Physical Exam: Right groin and distal thigh wound removed.  No evidence of bleeding.  No evidence of purulence.  Foot warm with motor and sensory intact  CBC    Component Value Date/Time   WBC 10.3 06/15/2018 0657   RBC 3.50 (L) 06/15/2018 0657   HGB 8.9 (L) 06/15/2018 0657   HCT 29.4 (L) 06/15/2018 0657   PLT 118 (L) 06/15/2018 0657   MCV 84.0 06/15/2018 0657   MCH 25.4 (L) 06/15/2018 0657   MCHC 30.3 06/15/2018 0657   RDW 16.7 (H) 06/15/2018 0657   LYMPHSABS 0.9 06/11/2018 1633   MONOABS 0.6 06/11/2018 1633   EOSABS 0.7 (H) 06/11/2018 1633   BASOSABS 0.0 06/11/2018 1633    BMET    Component Value Date/Time   NA 130 (L) 06/15/2018 0657   K 4.6 06/15/2018 0657   CL 94 (L) 06/15/2018 0657   CO2 24 06/15/2018 0657   GLUCOSE 119 (H) 06/15/2018 0657   BUN 35 (H) 06/15/2018 0657   CREATININE 5.76 (H) 06/15/2018 0657   CALCIUM 8.5 (L) 06/15/2018 0657   GFRNONAA 10 (L) 06/15/2018 0657   GFRAA 12 (L) 06/15/2018 0657    INR    Component Value Date/Time   INR 1.23 05/01/2018 1146     Intake/Output Summary (Last 24 hours) at 06/15/2018 1417 Last data filed at 06/15/2018 1155 Gross per 24 hour  Intake 700 ml  Output 2524 ml  Net -1824 ml     Assessment/Plan:  58 y.o. male table postop day 1 from removal of infected left femoropopliteal bypass and removal of infected femoral endarterectomy patch.  Had placement of a saphenous vein patch from his common femoral to his profundus femoris artery.  We will continue wet-to-dry care to And popliteal incision.  Probable start VAC dressing on Monday.     [name], MD FACS [facility] [phone] 06/15/2018 2:17 PM

## 2018-06-16 LAB — GLUCOSE, CAPILLARY
GLUCOSE-CAPILLARY: 122 mg/dL — AB (ref 70–99)
GLUCOSE-CAPILLARY: 136 mg/dL — AB (ref 70–99)
Glucose-Capillary: 95 mg/dL (ref 70–99)
Glucose-Capillary: 140 mg/dL — ABNORMAL HIGH (ref 70–99)

## 2018-06-16 LAB — CULTURE, BLOOD (ROUTINE X 2)
Culture: NO GROWTH
Culture: NO GROWTH
SPECIAL REQUESTS: ADEQUATE

## 2018-06-16 LAB — CBC
HCT: 28.8 % — ABNORMAL LOW (ref 39.0–52.0)
Hemoglobin: 8.7 g/dL — ABNORMAL LOW (ref 13.0–17.0)
MCH: 25.2 pg — ABNORMAL LOW (ref 26.0–34.0)
MCHC: 30.2 g/dL (ref 30.0–36.0)
MCV: 83.5 fL (ref 80.0–100.0)
NRBC: 0 % (ref 0.0–0.2)
Platelets: 112 10*3/uL — ABNORMAL LOW (ref 150–400)
RBC: 3.45 MIL/uL — ABNORMAL LOW (ref 4.22–5.81)
RDW: 16.9 % — ABNORMAL HIGH (ref 11.5–15.5)
WBC: 8.8 10*3/uL (ref 4.0–10.5)

## 2018-06-16 MED ORDER — LEVALBUTEROL HCL 0.63 MG/3ML IN NEBU
0.6300 mg | INHALATION_SOLUTION | Freq: Three times a day (TID) | RESPIRATORY_TRACT | Status: DC
  Administered 2018-06-16 – 2018-06-19 (×10): 0.63 mg via RESPIRATORY_TRACT
  Filled 2018-06-16 (×11): qty 3

## 2018-06-16 NOTE — Evaluation (Signed)
Occupational Therapy Evaluation Patient Details Name: Jose Sellers MRN: [id number] DOB: Dec 26, 1960 Today's Date: 06/16/2018    History of Present Illness Patient is 58 y/o male presenting to hospital with left groin wound infection. Patient recently s/p left femoral endarterectomy with patch angioplasty and left femoral to above-the-knee popliteal bypass with PTFE graft on 05/04/18. Removal of L fem-pop BPG 06/16/18.  PMH includes CAD, CHF, COPD, ESRD on HD MWF, GERD, HTN, DMII, AICD, PAD, afib.    Clinical Impression   Pt was functioning modified independently prior to admission, ambulating with a RW. He presents with L LE pain and impaired dynamic standing balance. He requires min guard assist for OOB mobility and up to mod assist for ADL. Will follow acutely. Do not anticipate pt will require post acute OT upon discharge.    Follow Up Recommendations  No OT follow up    Equipment Recommendations  None recommended by OT    Recommendations for Other Services       Precautions / Restrictions Precautions Precautions: Fall Precaution Comments: reports 2 falls in last month Restrictions Weight Bearing Restrictions: No      Mobility Bed Mobility Overal bed mobility: Modified Independent             General bed mobility comments: no assist, HOB up  Transfers Overall transfer level: Needs assistance Equipment used: Rolling walker (2 wheeled) Transfers: Sit to/from Stand Sit to Stand: Supervision         General transfer comment: verbal cues for hand placement    Balance Overall balance assessment: Needs assistance   Sitting balance-Leahy Scale: Good     Standing balance support: Bilateral upper extremity supported Standing balance-Leahy Scale: Fair Standing balance comment: can release walker in static standing at sink                           ADL either performed or assessed with clinical judgement   ADL Overall ADL's : Needs  assistance/impaired Eating/Feeding: Independent;Bed level   Grooming: Wash/dry hands;Standing;Min guard   Upper Body Bathing: Set up;Sitting   Lower Body Bathing: Minimal assistance;Sit to/from stand   Upper Body Dressing : Set up;Sitting   Lower Body Dressing: Moderate assistance;Sit to/from stand   Toilet Transfer: Min guard;Ambulation;RW   Toileting- Water quality scientist and Hygiene: Min guard;Sit to/from stand       Functional mobility during ADLs: Min guard;Rolling walker       Vision Patient Visual Report: No change from baseline       Perception     Praxis      Pertinent Vitals/Pain Pain Assessment: 0-10 Pain Score: 9  Pain Location: LLE Pain Descriptors / Indicators: Discomfort;Guarding;Burning Pain Intervention(s): Monitored during session;Repositioned     Hand Dominance Right   Extremity/Trunk Assessment Upper Extremity Assessment Upper Extremity Assessment: Overall WFL for tasks assessed   Lower Extremity Assessment LLE Deficits / Details: LLE wound infection   Cervical / Trunk Assessment Cervical / Trunk Assessment: Normal   Communication Communication Communication: No difficulties   Cognition Arousal/Alertness: Awake/alert Behavior During Therapy: WFL for tasks assessed/performed Overall Cognitive Status: Within Functional Limits for tasks assessed                                     General Comments       Exercises     Shoulder Instructions      Home  Living Family/patient expects to be discharged to:: Private residence Living Arrangements: Spouse/significant other Available Help at Discharge: Family;Available 24 hours/day Type of Home: House Home Access: Stairs to enter [employer] of Steps: 10 Entrance Stairs-Rails: Left;Right;Can reach both Home Layout: One level     Bathroom Shower/Tub: Occupational psychologist: Handicapped height     Home Equipment: Environmental consultant - 2 wheels;Cane - single  point;Grab bars - tub/shower;Shower seat          Prior Functioning/Environment Level of Independence: Independent with assistive device(s)        Comments: Used a cane or RW for mobility        OT Problem List: Impaired balance (sitting and/or standing);Pain;Decreased knowledge of use of DME or AE      OT Treatment/Interventions: Self-care/ADL training;DME and/or AE instruction;Therapeutic activities;Patient/family education;Balance training    OT Goals(Current goals can be found in the care plan section) Acute Rehab OT Goals Patient Stated Goal: "get back to fishing" OT Goal Formulation: With patient Time For Goal Achievement: 06/29/2018 Potential to Achieve Goals: Good ADL Goals Pt Will Perform Grooming: with modified independence;standing Pt Will Perform Lower Body Bathing: with modified independence;with adaptive equipment;sit to/from stand Pt Will Perform Lower Body Dressing: with modified independence;sit to/from stand;with adaptive equipment Pt Will Transfer to Toilet: with modified independence;ambulating Pt Will Perform Toileting - Clothing Manipulation and hygiene: with modified independence;sit to/from stand Pt Will Perform Tub/Shower Transfer: with supervision;ambulating;shower seat;rolling walker;Shower transfer  OT Frequency: Min 2X/week   Barriers to D/C:            Co-evaluation              AM-PAC OT "6 Clicks" Daily Activity     Outcome Measure Help from another person eating meals?: None Help from another person taking care of personal grooming?: A Little Help from another person toileting, which includes using toliet, bedpan, or urinal?: A Little Help from another person bathing (including washing, rinsing, drying)?: A Lot Help from another person to put on and taking off regular upper body clothing?: A Little Help from another person to put on and taking off regular lower body clothing?: A Lot 6 Click Score: 17   End of Session Equipment  Utilized During Treatment: Rolling walker;Gait belt  Activity Tolerance: Patient tolerated treatment well Patient left: in bed;with call bell/phone within reach;with family/visitor present  OT Visit Diagnosis: Unsteadiness on feet (R26.81);Other abnormalities of gait and mobility (R26.89);Pain;History of falling (Z91.81);Muscle weakness (generalized) (M62.81) Pain - Right/Left: Left Pain - part of body: Leg                Time: 2122-4825 OT Time Calculation (min): 12 min Charges:  OT General Charges $OT Visit: 1 Visit OT Evaluation $OT Eval Moderate Complexity: 1 Mod  Jose Sellers, OTR/L Acute Rehabilitation Services Pager: [phone] Office: [phone]  Jose Sellers 06/16/2018, 11:51 AM

## 2018-06-16 NOTE — Progress Notes (Signed)
PROGRESS NOTE [facility]   Jose Sellers   BOF:[id number] DOB: 05/06/1960  DOA: 06/11/2018 PCP: Raina Mina., MD   Brief Narrative:  Jose Sellers is an 58 y.o. male with medical history significant ofA. Fibnot on AC,s/p AICD, asthma, CAD, sCHFwith EF 25%, COPD,DM,ESRD-HD (MWF),PADhyperlipidemia, chronic thrombocytopenia,who presentswith left groinerythema and swelling, open wound in left medial groin-- sent in from the vascular office routine follow up.  Subjective: Patient seen and examined, he has no complaints and feeling well today.  New changes.  Assessment & Plan:   Principal Problem:   Wound infection Active Problems:   CAD (coronary artery disease)   Chronic systolic congestive heart failure (HCC)   COPD (chronic obstructive pulmonary disease) (HCC)   ESRD on dialysis (HCC)   GERD (gastroesophageal reflux disease)   Hypercholesterolemia   Essential hypertension   Tobacco use   Uncontrolled type 2 diabetes mellitus with diabetic polyneuropathy, with long-term current use of insulin (HCC)   AICD (automatic cardioverter/defibrillator) present   PAD (peripheral artery disease) (HCC)   Open wound of left thigh   Chronic atrial fibrillation   Cellulitis of left groin   Thrombocytopenia (HCC)   Left groin pain  Left groin Staphylococcus aureus wound infection/infected left femoropopliteal bypass  - Empiric antimicrobial treatment with vancomycin - Dr. Eliseo Squires discussed with ID his recent c diff infection (June) and was recommended oral vancomaycin 125mg  QID for while on antibiotics and for 1 week after - Blood cultures x 2, negative to date -Vascular surgery consulted and patient was taken to the OR and underwent removal of infected left femoropopliteal bypass and removal of infected femoral endarterectomy patch, had placement of a saphenous vein patch from his common femoral to his profunda femoris artery 3/19. -Remains afebrile, continue management  per vascular surgery.  Awaiting sensitivity. -May need to discuss with ID regarding duration of antibiotic.  PAD:  -As per vascular surgery follow-up, patient underwent extensive endarterectomy in his left common femoral with Dacron patch angioplasty and with Gore-Tex bypass of the dacryon to the above knee popliteal artery on 2/7.  His foot is well perfused and he does have healing of his toe ulcer. -Treatment per vascular surgerye.  CAD (coronary artery disease): -Asymptomatic, continue Lipitor, aspirin and metoprolol.   ESRD on HD (MWF):  -Nephrology following, continue dialysis per schedule.  Chronic systolic congestive heart failure ([place]): 2D echo on 10/31/2017 showed EF 25-30%. Patient does not have leg edema or JVD. CHF seems to be compensated. -Continue home dose Lasix -Volume management per renal by dialysis  COPD (chronic obstructive pulmonary disease)(HCC): -Stable continue bronchodilators.  HTN:  -Blood pressure at goal, continue current regimen.  Tobacco abuse: -Nicotine patch  Uncontrolled type 2 diabetes mellitus with diabetic polyneuropathy, with long-term current use of insulin, with ESRD:Last A1c6.0 on 05/01/18, well controled. Patient is takingLantus as neededat home.  Not on Lantus here. -SSI with reasonable control.  Thrombocytopenia ([place]):This is a chronic issue.  Platelets stable.  Postop acute blood loss anemia Baseline hemoglobin around 10-11.  Hemoglobin down to 8.7.  No signs of overt bleeding.  Follow-up CBC in a.m.  Transfuse if hemoglobin less than 7.  DVT prophylaxis: Lovenox Code Status: Full code Family Communication: Wife at bedside Disposition Plan: DC pending clearance from vascular surgery and clinical improvement.  Home health PT recommended by PT/OT   Consultants:   Nephrology  Vascular surgery  Antimicrobials: Anti-infectives (From admission, onward)   Start     Dose/Rate Route Frequency Ordered Stop  06/13/18  1515  vancomycin (VANCOCIN) IVPB 1000 mg/200 mL premix     1,000 mg 200 mL/hr over 60 Minutes Intravenous  Once 06/13/18 1512 06/13/18 1729   06/13/18 1503  vancomycin (VANCOCIN) 1-5 GM/200ML-% IVPB    Note to Pharmacy:  Rodell Perna   : cabinet override      06/13/18 1503 06/13/18 1629   06/13/18 1200  vancomycin (VANCOCIN) 1,000 mg in sodium chloride 0.9 % 250 mL IVPB     1,000 mg 250 mL/hr over 60 Minutes Intravenous Every M-W-F (Hemodialysis) 06/13/18 0756     06/12/18 1430  vancomycin (VANCOCIN) 50 mg/mL oral solution 125 mg     125 mg Oral Every 6 hours 06/12/18 1341     06/11/18 2030  vancomycin (VANCOCIN) 1,500 mg in sodium chloride 0.9 % 500 mL IVPB     1,500 mg 250 mL/hr over 120 Minutes Intravenous  Once 06/11/18 2019 06/11/18 2300   06/11/18 2018  vancomycin variable dose per unstable renal function (pharmacist dosing)  Status:  Discontinued      Does not apply See admin instructions 06/11/18 2019 06/13/18 1101   06/11/18 2000  vancomycin (VANCOCIN) 1,500 mg in sodium chloride 0.9 % 500 mL IVPB  Status:  Discontinued     1,500 mg 250 mL/hr over 120 Minutes Intravenous  Once 06/11/18 1905 06/11/18 1927       Objective: Vitals:   06/15/18 1400 06/15/18 1953 06/15/18 2009 06/16/18 0510  BP: (!) 111/58  115/68 118/67  Pulse: 71  68   Resp:   19 20  Temp:   98.2 F (36.8 C) (!) 97.5 F (36.4 C)  TempSrc:   Oral Oral  SpO2:  97% 94% 100%  Weight:    84.3 kg  Height:        Intake/Output Summary (Last 24 hours) at 06/16/2018 0915 Last data filed at 06/16/2018 0817 Gross per 24 hour  Intake 420 ml  Output 2524 ml  Net -2104 ml   Filed Weights   06/15/18 0729 06/15/18 1155 06/16/18 0510  Weight: 81.7 kg 79.2 kg 84.3 kg    Examination:  General exam: Appears calm and comfortable  Respiratory system: Good air entry, mild rhonchi throughout. Cardiovascular system: S1 & S2 heard, RRR. No JVD, murmurs, rubs or gallops Gastrointestinal system: Abdomen is  nondistended, soft and nontender. No organomegaly or masses felt. Normal bowel sounds heard. Central nervous system: Alert and oriented. No focal neurological deficits. Extremities: No pedal edema. Symmetric, strength 5/5   Skin: Chronic unstageable ulcer on left heel and great toe.  Dressing on thigh clean and dry. Psychiatry: Judgement and insight appear normal. Mood & affect appropriate.    Data Reviewed: I have personally reviewed following labs and imaging studies  CBC: Recent Labs  Lab 06/11/18 1633 06/12/18 0536 06/13/18 1440 06/15/18 0657 06/16/18 0214  WBC 8.3 9.0 7.7 10.3 8.8  NEUTROABS 6.1  --   --   --   --   HGB 12.0* 10.7* 13.4 8.9* 8.7*  HCT 40.5 34.7* 44.1 29.4* 28.8*  MCV 83.9 83.2 81.8 84.0 83.5  PLT 104* 101* 92* 118* 211*   Basic Metabolic Panel: Recent Labs  Lab 06/11/18 1633 06/12/18 0536 06/13/18 1430 06/15/18 0657  NA 133* 134* 132* 130*  K 4.2 3.7 3.9 4.6  CL 91* 92* 92* 94*  CO2 28 30 26 24   GLUCOSE 124* 108* 113* 119*  BUN 27* 31* 41* 35*  CREATININE 4.22* 4.63* 5.90* 5.76*  CALCIUM 9.4 9.4  9.0 8.5*  PHOS  --   --  3.7 3.4   GFR: Estimated Creatinine Clearance: 15.7 mL/min (A) (by C-G formula based on SCr of 5.76 mg/dL (H)). Liver Function Tests: Recent Labs  Lab 06/11/18 1633 06/13/18 1430 06/15/18 0657  AST 30  --   --   ALT 17  --   --   ALKPHOS 155*  --   --   BILITOT 1.8*  --   --   PROT 6.9  --   --   ALBUMIN 2.8* 2.4* 2.3*   No results for input(s): LIPASE, AMYLASE in the last 168 hours. No results for input(s): AMMONIA in the last 168 hours. Coagulation Profile: No results for input(s): INR, PROTIME in the last 168 hours. Cardiac Enzymes: No results for input(s): CKTOTAL, CKMB, CKMBINDEX, TROPONINI in the last 168 hours. BNP (last 3 results) No results for input(s): PROBNP in the last 8760 hours. HbA1C: No results for input(s): HGBA1C in the last 72 hours. CBG: Recent Labs  Lab 06/15/18 0639 06/15/18 1301  06/15/18 1728 06/15/18 2141 06/16/18 0628  GLUCAP 108* 120* 132* 173* 122*   Lipid Profile: No results for input(s): CHOL, HDL, LDLCALC, TRIG, CHOLHDL, LDLDIRECT in the last 72 hours. Thyroid Function Tests: No results for input(s): TSH, T4TOTAL, FREET4, T3FREE, THYROIDAB in the last 72 hours. Anemia Panel: No results for input(s): VITAMINB12, FOLATE, FERRITIN, TIBC, IRON, RETICCTPCT in the last 72 hours. Sepsis Labs: Recent Labs  Lab 06/11/18 1633 06/11/18 2025  PROCALCITON  --  0.65  LATICACIDVEN 1.4  --     Recent Results (from the past 240 hour(s))  Blood culture (routine x 2)     Status: None   Collection Time: 06/11/18  4:52 PM  Result Value Ref Range Status   Specimen Description BLOOD LEFT HAND  Final   Special Requests   Final    BOTTLES DRAWN AEROBIC AND ANAEROBIC Blood Culture adequate volume   Culture   Final    NO GROWTH 5 DAYS Performed at [facility], [address], [place], [place] [address]    Report Status 06/16/2018 FINAL  Final  Blood culture (routine x 2)     Status: None   Collection Time: 06/11/18  5:01 PM  Result Value Ref Range Status   Specimen Description BLOOD LEFT WRIST  Final   Special Requests   Final    BOTTLES DRAWN AEROBIC AND ANAEROBIC Blood Culture results may not be optimal due to an inadequate volume of blood received in culture bottles   Culture   Final    NO GROWTH 5 DAYS Performed at [facility], [place] [address], [place], [place] [address]    Report Status 06/16/2018 FINAL  Final  MRSA PCR Screening     Status: Abnormal   Collection Time: 06/12/18  7:52 AM  Result Value Ref Range Status   MRSA by PCR POSITIVE (A) NEGATIVE Final    Comment:        The GeneXpert MRSA Assay (FDA approved for NASAL specimens only), is one component of a comprehensive MRSA colonization surveillance program. It is not intended to diagnose MRSA infection nor to guide or monitor treatment for MRSA infections. RESULT CALLED TO,  READ BACK BY AND VERIFIED WITH: Lurline Hare RN 9:45 06/12/18 ([email or web address]) Performed at [facility], [place] [address], [place],  [address]   Aerobic/Anaerobic Culture (surgical/deep wound)     Status: None (Preliminary result)   Collection Time: 06/14/18 10:55  AM  Result Value Ref Range Status   Specimen Description WOUND LEFT GROIN  Final   Special Requests PATIENT ON FOLLOWING VANC  Final   Gram Stain   Final    FEW WBC PRESENT, PREDOMINANTLY PMN NO ORGANISMS SEEN    Culture   Final    FEW STAPHYLOCOCCUS AUREUS SUSCEPTIBILITIES TO FOLLOW Performed at [facility], [place] [address] [place], [place] [address]    Report Status PENDING  Incomplete      Radiology Studies: No results found.    Scheduled Meds: . amiodarone  200 mg Oral BID  . aspirin EC  81 mg Oral Daily  . atorvastatin  40 mg Oral BID  . calcitRIOL  0.5 mcg Oral Q M,W,F-HD  . collagenase  1 application Topical Daily  . feeding supplement (PRO-STAT SUGAR FREE 64)  30 mL Oral BID  . ferric citrate  420 mg Oral TID WC  . FLUoxetine  20 mg Oral Daily  . furosemide  40 mg Oral Once per day on Sun Tue Thu Sat  . gabapentin  300 mg Oral QHS  . heparin injection (subcutaneous)  5,000 Units Subcutaneous Q12H  . insulin aspart  0-5 Units Subcutaneous QHS  . insulin aspart  0-9 Units Subcutaneous TID WC  . levalbuterol  0.63 mg Inhalation BID  . lidocaine-prilocaine  1 application Topical Q M,W,F-HD  . metoprolol tartrate  12.5 mg Oral BID  . multivitamin  1 tablet Oral QHS  . mupirocin ointment   Nasal BID  . nicotine  21 mg Transdermal Daily  . pantoprazole  40 mg Oral Daily  . umeclidinium bromide  1 puff Inhalation Daily  . vancomycin  125 mg Oral Q6H   Continuous Infusions: . sodium chloride 10 mL/hr at 06/14/18 0938  . vancomycin 1,000 mg (06/15/18 1110)     LOS: 4 days    Time spent: Total of 35 minutes spent with pt, greater than 50% of which was spent in discussion of  treatment,  counseling and coordination of care   Chipper Oman, MD  How to contact the [facility] Attending or Consulting provider [place] or covering provider during after hours [place], for this patient?  1. Check the care team in [facility] and look for a) attending/consulting [facility] provider listed and b) the [facility] team listed 2. Log into [email or web address] and use [place]'s universal password to access. If you do not have the password, please contact the hospital operator. 3. Locate the [facility] provider you are looking for under [facility] and page to a number that you can be directly reached. 4. If you still have difficulty reaching the provider, please page the [facility] (Director on Call) for the Hospitalists listed on amion for assistance.

## 2018-06-16 NOTE — Progress Notes (Addendum)
    Subjective  - POD #2, s/op removal of right fem pop BPG (PTFE  Feels ok this am.  No significant pain   Physical Exam:  Doppler PT signal Dressings changed, wounds at both incisions looked ok       Assessment/Plan:  POD #2  Continue wet to dry dressing changes Continue ABX.  Cx growing Staph.  Sensitivities pending Still awaiting prafo boot  Wells Brabham 06/16/2018 8:08 AM --  Vitals:   06/15/18 2009 06/16/18 0510  BP: 115/68 118/67  Pulse: 68   Resp: 19 20  Temp: 98.2 F (36.8 C) (!) 97.5 F (36.4 C)  SpO2: 94% 100%    Intake/Output Summary (Last 24 hours) at 06/16/2018 0808 Last data filed at 06/15/2018 1430 Gross per 24 hour  Intake 240 ml  Output 2524 ml  Net -2284 ml     Laboratory CBC    Component Value Date/Time   WBC 8.8 06/16/2018 0214   HGB 8.7 (L) 06/16/2018 0214   HCT 28.8 (L) 06/16/2018 0214   PLT 112 (L) 06/16/2018 0214    BMET    Component Value Date/Time   NA 130 (L) 06/15/2018 0657   K 4.6 06/15/2018 0657   CL 94 (L) 06/15/2018 0657   CO2 24 06/15/2018 0657   GLUCOSE 119 (H) 06/15/2018 0657   BUN 35 (H) 06/15/2018 0657   CREATININE 5.76 (H) 06/15/2018 0657   CALCIUM 8.5 (L) 06/15/2018 0657   GFRNONAA 10 (L) 06/15/2018 0657   GFRAA 12 (L) 06/15/2018 0657    COAG Lab Results  Component Value Date   INR 1.23 05/01/2018   INR 1.08 02/13/2018   No results found for: PTT  Antibiotics Anti-infectives (From admission, onward)   Start     Dose/Rate Route Frequency Ordered Stop   06/13/18 1515  vancomycin (VANCOCIN) IVPB 1000 mg/200 mL premix     1,000 mg 200 mL/hr over 60 Minutes Intravenous  Once 06/13/18 1512 06/13/18 1729   06/13/18 1503  vancomycin (VANCOCIN) 1-5 GM/200ML-% IVPB    Note to Pharmacy:  Rodell Perna   : cabinet override      06/13/18 1503 06/13/18 1629   06/13/18 1200  vancomycin (VANCOCIN) 1,000 mg in sodium chloride 0.9 % 250 mL IVPB     1,000 mg 250 mL/hr over 60 Minutes Intravenous Every  M-W-F (Hemodialysis) 06/13/18 0756     06/12/18 1430  vancomycin (VANCOCIN) 50 mg/mL oral solution 125 mg     125 mg Oral Every 6 hours 06/12/18 1341     06/11/18 2030  vancomycin (VANCOCIN) 1,500 mg in sodium chloride 0.9 % 500 mL IVPB     1,500 mg 250 mL/hr over 120 Minutes Intravenous  Once 06/11/18 2019 06/11/18 2300   06/11/18 2018  vancomycin variable dose per unstable renal function (pharmacist dosing)  Status:  Discontinued      Does not apply See admin instructions 06/11/18 2019 06/13/18 1101   06/11/18 2000  vancomycin (VANCOCIN) 1,500 mg in sodium chloride 0.9 % 500 mL IVPB  Status:  Discontinued     1,500 mg 250 mL/hr over 120 Minutes Intravenous  Once 06/11/18 1905 06/11/18 1927       V. Leia Alf, M.D., [facility] [place] Office: [phone] Pager:  [phone]

## 2018-06-16 NOTE — Progress Notes (Signed)
Physical Therapy Treatment Patient Details Name: Jose Sellers MRN: [id number] DOB: 01-26-1961 Today's Date: 06/16/2018    History of Present Illness Patient is 58 y/o male presenting to hospital with left groin wound infection. Patient recently s/p left femoral endarterectomy with patch angioplasty and left femoral to above-the-knee popliteal bypass with PTFE graft on 05/04/18. Removal of L fem-pop BPG 06/16/18.  PMH includes CAD, CHF, COPD, ESRD on HD MWF, GERD, HTN, DMII, AICD, PAD, afib.     PT Comments    Patient seen for mobility progression. Tolerated session well after dove tail with OT. Patient continues to endorse increased pain in groin and LE. Min guard for safety with mobility. Current POC remains appropriate.   Follow Up Recommendations  Home health PT;Supervision/Assistance - 24 hour     Equipment Recommendations  3in1 (PT)    Recommendations for Other Services       Precautions / Restrictions Precautions Precautions: Fall Precaution Comments: reports 2 falls in last month Restrictions Weight Bearing Restrictions: No    Mobility  Bed Mobility Overal bed mobility: Modified Independent             General bed mobility comments: no assist, HOB up  Transfers Overall transfer level: Needs assistance Equipment used: Rolling walker (2 wheeled) Transfers: Sit to/from Stand Sit to Stand: Supervision         General transfer comment: verbal cues for hand placement  Ambulation/Gait Ambulation/Gait assistance: Min guard Gait Distance (Feet): 140 Feet Assistive device: Rolling walker (2 wheeled) Gait Pattern/deviations: Step-to pattern;Decreased step length - right;Decreased stance time - left;Decreased stride length;Decreased weight shift to left;Antalgic Gait velocity: decreased Gait velocity interpretation: <1.8 ft/sec, indicate of risk for recurrent falls General Gait Details: Min guard for safety, some instability with balance checks x3   Stairs             Wheelchair Mobility    Modified Rankin (Stroke Patients Only)       Balance Overall balance assessment: Needs assistance   Sitting balance-Leahy Scale: Good     Standing balance support: Bilateral upper extremity supported Standing balance-Leahy Scale: Fair Standing balance comment: able to release UE support                            Cognition Arousal/Alertness: Awake/alert Behavior During Therapy: WFL for tasks assessed/performed Overall Cognitive Status: Within Functional Limits for tasks assessed                                        Exercises      General Comments        Pertinent Vitals/Pain Pain Assessment: 0-10 Pain Score: 8  Pain Location: LLE Pain Descriptors / Indicators: Discomfort;Guarding;Burning Pain Intervention(s): Monitored during session    Home Living Family/patient expects to be discharged to:: Private residence Living Arrangements: Spouse/significant other Available Help at Discharge: Family;Available 24 hours/day Type of Home: House Home Access: Stairs to enter Entrance Stairs-Rails: Left;Right;Can reach both Home Layout: One level Home Equipment: Environmental consultant - 2 wheels;Cane - single point;Grab bars - tub/shower;Shower seat      Prior Function Level of Independence: Independent with assistive device(s)      Comments: Used a cane or RW for mobility   PT Goals (current goals can now be found in the care plan section) Acute Rehab PT Goals Patient Stated Goal: "get back to  fishing" PT Goal Formulation: With patient/family Time For Goal Achievement: 06/26/18 Potential to Achieve Goals: Good Progress towards PT goals: Progressing toward goals    Frequency    Min 3X/week      PT Plan Current plan remains appropriate    Co-evaluation              AM-PAC PT "6 Clicks" Mobility   Outcome Measure  Help needed turning from your back to your side while in a flat bed without using  bedrails?: None Help needed moving from lying on your back to sitting on the side of a flat bed without using bedrails?: None Help needed moving to and from a bed to a chair (including a wheelchair)?: A Little Help needed standing up from a chair using your arms (e.g., wheelchair or bedside chair)?: A Little Help needed to walk in hospital room?: A Little Help needed climbing 3-5 steps with a railing? : A Lot 6 Click Score: 19    End of Session Equipment Utilized During Treatment: Gait belt Activity Tolerance: Patient tolerated treatment well Patient left: in bed;with call bell/phone within reach;with family/visitor present(sitting EOB) Nurse Communication: Mobility status PT Visit Diagnosis: Other abnormalities of gait and mobility (R26.89);Muscle weakness (generalized) (M62.81);Difficulty in walking, not elsewhere classified (R26.2);History of falling (Z91.81)     Time: 2952-8413 PT Time Calculation (min) (ACUTE ONLY): 16 min  Charges:  $Gait Training: 8-22 mins                     Alben Deeds, PT DPT  Board Certified Neurologic Specialist [place] Pager [phone] Office [place] 06/16/2018, 1:16 PM

## 2018-06-16 NOTE — Progress Notes (Signed)
[facility] Progress Note   Subjective:  Seen on HD, pt w/o complaints.    Objective Vitals:   06/16/18 0510 06/16/18 0916 06/16/18 0917 06/16/18 0952  BP: 118/67   121/66  Pulse:    67  Resp: 20     Temp: (!) 97.5 F (36.4 C)     TempSrc: Oral     SpO2: 100% 100% 100%   Weight: 84.3 kg     Height:        Physical Exam General: Chronically ill appearing male NAD Lungs: Barrel chest Decreased air movement, occasional wheeze  Heart: RRR with S1 S2 Abdomen: soft NT + BS Lower extremities: Trace  LE edema , large dressing L groin postop Dialysis Access: R forearm AVF +bruit; R IJ TDC     Home meds  - aspirin 81/ atorvastatin 40 bid/ sl ntg prn  - amiodarone 200 bid/ metoprolol 12.5 bid  - insulin glargine 8- 12 ug qd/ insulin lispro 0-32 units ac tid  - esomeprazole 40 qd/ ferric citrate 420mg  ac  - fluoxetine 20 qd/ gabapentin 300 hs/ oxy IR 30mg  prn/ oxy-aceta prn  - furosemide 40mg  on non HD days  - tiotropium 18 ug qd/ levalbuterol 0.63 bid   - prn's/ vitamins    [place] MWF  4h   350/1.5   78kg    2/2.25 bath   P2   R AVF/ TDC still in   Hep none Venofer 100 mg IV x 10 (until 3/23) Calcitriol 0.5 mcg PO TIW   Assessment/Plan: 1. Infected fem-pop LLE graft (placed 05/04/18 for ischemic toe ulcer)- SP removal with new saphenous vein patch from common femoral to profundus femoris artery, done 06/14/18. Per VVS. Wound cx+ Staph aureus, on IV Vanc.  2. ESRD - HD MWF. HD Monday. Advised by OP unit that may need fistulogram d/t cannulation issus. Has TDC in. No issues used AVF 2 needles on 3/18 and 3/20 here.  3. Hypertension/volume  - BP acceptable. No vol ^ on exam.   4. Anemia  - Hgb 10.7. No ESA needs. Hold IV Fe while getting antibiotics  5. Metabolic bone disease -  Continue VDRA/binders 6. Nutrition - Prot supp for low albumin     [facility] 06/15/2018, 1:22 PM  Weight change: 3.7 kg   Additional  Objective Labs: Basic Metabolic Panel: Recent Labs  Lab 06/12/18 0536 06/13/18 1430 06/15/18 0657  NA 134* 132* 130*  K 3.7 3.9 4.6  CL 92* 92* 94*  CO2 30 26 24   GLUCOSE 108* 113* 119*  BUN 31* 41* 35*  CREATININE 4.63* 5.90* 5.76*  CALCIUM 9.4 9.0 8.5*  PHOS  --  3.7 3.4   CBC: Recent Labs  Lab 06/11/18 1633 06/12/18 0536 06/13/18 1440 06/15/18 0657 06/16/18 0214  WBC 8.3 9.0 7.7 10.3 8.8  NEUTROABS 6.1  --   --   --   --   HGB 12.0* 10.7* 13.4 8.9* 8.7*  HCT 40.5 34.7* 44.1 29.4* 28.8*  MCV 83.9 83.2 81.8 84.0 83.5  PLT 104* 101* 92* 118* 112*   Blood Culture    Component Value Date/Time   SDES WOUND LEFT GROIN 06/14/2018 1055   SPECREQUEST PATIENT ON FOLLOWING VANC 06/14/2018 1055   CULT  06/14/2018 1055    FEW STAPHYLOCOCCUS AUREUS SUSCEPTIBILITIES TO FOLLOW Performed at [facility], [place] [address], [place], [place] [address]    REPTSTATUS PENDING 06/14/2018 1055     Medications: . sodium chloride  10 mL/hr at 06/14/18 0938  . vancomycin 1,000 mg (06/15/18 1110)   . amiodarone  200 mg Oral BID  . aspirin EC  81 mg Oral Daily  . atorvastatin  40 mg Oral BID  . calcitRIOL  0.5 mcg Oral Q M,W,F-HD  . collagenase  1 application Topical Daily  . feeding supplement (PRO-STAT SUGAR FREE 64)  30 mL Oral BID  . ferric citrate  420 mg Oral TID WC  . FLUoxetine  20 mg Oral Daily  . furosemide  40 mg Oral Once per day on Sun Tue Thu Sat  . gabapentin  300 mg Oral QHS  . heparin injection (subcutaneous)  5,000 Units Subcutaneous Q12H  . insulin aspart  0-5 Units Subcutaneous QHS  . insulin aspart  0-9 Units Subcutaneous TID WC  . levalbuterol  0.63 mg Inhalation TID  . lidocaine-prilocaine  1 application Topical Q M,W,F-HD  . metoprolol tartrate  12.5 mg Oral BID  . multivitamin  1 tablet Oral QHS  . mupirocin ointment   Nasal BID  . nicotine  21 mg Transdermal Daily  . pantoprazole  40 mg Oral Daily  . umeclidinium bromide  1 puff Inhalation Daily   . vancomycin  125 mg Oral Q6H

## 2018-06-17 DIAGNOSIS — I2583 Coronary atherosclerosis due to lipid rich plaque: Secondary | ICD-10-CM

## 2018-06-17 DIAGNOSIS — I2510 Atherosclerotic heart disease of native coronary artery without angina pectoris: Secondary | ICD-10-CM

## 2018-06-17 LAB — GLUCOSE, CAPILLARY
Glucose-Capillary: 105 mg/dL — ABNORMAL HIGH (ref 70–99)
Glucose-Capillary: 108 mg/dL — ABNORMAL HIGH (ref 70–99)
Glucose-Capillary: 114 mg/dL — ABNORMAL HIGH (ref 70–99)
Glucose-Capillary: 113 mg/dL — ABNORMAL HIGH (ref 70–99)

## 2018-06-17 MED ORDER — OXYCODONE HCL 5 MG PO TABS
5.0000 mg | ORAL_TABLET | ORAL | Status: DC | PRN
  Administered 2018-06-19 – 2018-06-20 (×3): 5 mg via ORAL
  Filled 2018-06-17 (×4): qty 1

## 2018-06-17 MED ORDER — OXYCODONE HCL ER 10 MG PO T12A
10.0000 mg | EXTENDED_RELEASE_TABLET | Freq: Two times a day (BID) | ORAL | Status: DC
  Administered 2018-06-17 – 2018-06-20 (×7): 10 mg via ORAL
  Filled 2018-06-17 (×7): qty 1

## 2018-06-17 MED ORDER — ACETAMINOPHEN 500 MG PO TABS
500.0000 mg | ORAL_TABLET | Freq: Four times a day (QID) | ORAL | Status: DC
  Administered 2018-06-17 – 2018-06-20 (×13): 500 mg via ORAL
  Filled 2018-06-17 (×13): qty 1

## 2018-06-17 NOTE — Progress Notes (Signed)
PROGRESS NOTE    Jose Sellers  WGY:[id number] DOB: March 15, 1961 DOA: 06/11/2018 PCP: Raina Mina., MD    Brief Narrative:  58 year old male who presented with left groin erythema and an open wound in the medial thigh.  He does have significant past medical history for chronic atrial fibrillation, heart failure status post AICD, asthma, ischemic cardiomyopathy ejection fraction 25%, COPD, type 2 diabetes mellitus, peripheral artery disease, dyslipidemia and end-stage renal disease on hemodialysis (MWF).  May 04, 2018 patient had a left femoral endarterectomy with patch angioplasty and left femoral to above-knee popliteal bypass.  March 13 he had signs of wound dehiscence, and failed outpatient therapy.  By March 16, the patient had worsening medial thigh pain, and positive bloody drainage.  The wound was found to be infected and patient was referred to the hospital for IV antibiotic therapy.  March 19 patient underwent removal of infected left femoral to popliteal Gore-Tex bypass and Dacron patch angioplasty of common and profunda femoris artery and vein patch angioplasty of common femoral and profundus femoris artery and above-knee popliteal artery.   Assessment & Plan:   Principal Problem:   Wound infection Active Problems:   CAD (coronary artery disease)   Chronic systolic congestive heart failure (HCC)   COPD (chronic obstructive pulmonary disease) (HCC)   ESRD on dialysis (HCC)   GERD (gastroesophageal reflux disease)   Hypercholesterolemia   Essential hypertension   Tobacco use   Uncontrolled type 2 diabetes mellitus with diabetic polyneuropathy, with long-term current use of insulin (HCC)   AICD (automatic cardioverter/defibrillator) present   PAD (peripheral artery disease) (HCC)   Open wound of left thigh   Chronic atrial fibrillation   Cellulitis of left groin   Thrombocytopenia (HCC)   Left groin pain   1. Left groin staphylococcus aureus wound infection/ left  femoropopliteal bypass.  Sp removal of left femoral to popliteal bypass. Continue IV Vancomycin per ID recommendations and pharmacy protocol. Currently on prophylactic po vancomycin due to recent c diff infection.   2. Peripheral vascular disease. Pain in not well controlled, will add long acting analgesics with oxycodone er, plus break through with IV and Po analgesics. Add scheduled acettaminophen.   3. CAD with dyslipidemia. Coronary artery disease. Patient is chest pain free. Continue aspirin and atorvastatin.   4. ESRD on HD. Clinical euvolemic, has a HD catheter in the right internal jugular vein. Continue furosemide for diuresis and calcitriol for metabolic bone disease.   5. Chronic atrial fibrillation. Continue rate control with amiodarone. Not on full anticoagulation, continue asa.  6. T2Dm. Continue glucose cover and monitoring with insulin sliding scale, fasting glucose today at 105.   DVT prophylaxis: enoxaparin   Code Status:  full Family Communication: I spoke with patient's wife at the bedside and all questions were addressed.  Disposition Plan/ discharge barriers:  Pending clinical improvement.   Body mass index is 25.21 kg/m. Malnutrition Type:      Malnutrition Characteristics:      Nutrition Interventions:     RN Pressure Injury Documentation:     Consultants:   Vascular surgery   Procedures:  March 19 patient underwent removal of infected left femoral to popliteal Gore-Tex bypass and Dacron patch angioplasty of common and profunda femoris artery and vein patch angioplasty of common femoral and profundus femoris artery and above-knee popliteal artery  Antimicrobials:   Vancomycin IV  Po vancomycin    Subjective: Patient continue to have pain on his left groin, moderate in intensity, only brief  relieve with analgesics, no dyspnea or chest pain. This am positive for nausea, he has been out of bed.   Objective: Vitals:   06/17/18 0849 06/17/18  0908 06/17/18 0914 06/17/18 0922  BP:      Pulse: 79 79  78  Resp: (!) 24  19   Temp:    99.2 F (37.3 C)  TempSrc:    Oral  SpO2: 92%  (!) 73% 90%  Weight:      Height:        Intake/Output Summary (Last 24 hours) at 06/17/2018 1135 Last data filed at 06/16/2018 2325 Gross per 24 hour  Intake 160 ml  Output -  Net 160 ml   Filed Weights   06/15/18 0729 06/15/18 1155 06/16/18 0510  Weight: 81.7 kg 79.2 kg 84.3 kg    Examination:   General: Not in pain or dyspnea, deconditioned  Neurology: Awake and alert, non focal  E ENT: mild pallor, no icterus, oral mucosa moist Cardiovascular: No JVD. S1-S2 present, rhythmic, no gallops, rubs, or murmurs. No lower extremity edema. Pulmonary: positive breath sounds bilaterally, adequate air movement, no wheezing, rhonchi or rales. Gastrointestinal. Abdomen with no organomegaly, non tender, no rebound or guarding Skin. No rashes/ left groin wound with dressing in place.  Musculoskeletal: no joint deformities     Data Reviewed: I have personally reviewed following labs and imaging studies  CBC: Recent Labs  Lab 06/11/18 1633 06/12/18 0536 06/13/18 1440 06/15/18 0657 06/16/18 0214  WBC 8.3 9.0 7.7 10.3 8.8  NEUTROABS 6.1  --   --   --   --   HGB 12.0* 10.7* 13.4 8.9* 8.7*  HCT 40.5 34.7* 44.1 29.4* 28.8*  MCV 83.9 83.2 81.8 84.0 83.5  PLT 104* 101* 92* 118* 973*   Basic Metabolic Panel: Recent Labs  Lab 06/11/18 1633 06/12/18 0536 06/13/18 1430 06/15/18 0657  NA 133* 134* 132* 130*  K 4.2 3.7 3.9 4.6  CL 91* 92* 92* 94*  CO2 28 30 26 24   GLUCOSE 124* 108* 113* 119*  BUN 27* 31* 41* 35*  CREATININE 4.22* 4.63* 5.90* 5.76*  CALCIUM 9.4 9.4 9.0 8.5*  PHOS  --   --  3.7 3.4   GFR: Estimated Creatinine Clearance: 15.7 mL/min (A) (by C-G formula based on SCr of 5.76 mg/dL (H)). Liver Function Tests: Recent Labs  Lab 06/11/18 1633 06/13/18 1430 06/15/18 0657  AST 30  --   --   ALT 17  --   --   ALKPHOS 155*  --    --   BILITOT 1.8*  --   --   PROT 6.9  --   --   ALBUMIN 2.8* 2.4* 2.3*   No results for input(s): LIPASE, AMYLASE in the last 168 hours. No results for input(s): AMMONIA in the last 168 hours. Coagulation Profile: No results for input(s): INR, PROTIME in the last 168 hours. Cardiac Enzymes: No results for input(s): CKTOTAL, CKMB, CKMBINDEX, TROPONINI in the last 168 hours. BNP (last 3 results) No results for input(s): PROBNP in the last 8760 hours. HbA1C: No results for input(s): HGBA1C in the last 72 hours. CBG: Recent Labs  Lab 06/16/18 1253 06/16/18 1659 06/16/18 2040 06/17/18 0603 06/17/18 1121  GLUCAP 136* 95 140* 105* 108*   Lipid Profile: No results for input(s): CHOL, HDL, LDLCALC, TRIG, CHOLHDL, LDLDIRECT in the last 72 hours. Thyroid Function Tests: No results for input(s): TSH, T4TOTAL, FREET4, T3FREE, THYROIDAB in the last 72 hours. Anemia Panel: No results  for input(s): VITAMINB12, FOLATE, FERRITIN, TIBC, IRON, RETICCTPCT in the last 72 hours.    Radiology Studies: I have reviewed all of the imaging during this hospital visit personally     Scheduled Meds: . acetaminophen  500 mg Oral Q6H  . amiodarone  200 mg Oral BID  . aspirin EC  81 mg Oral Daily  . atorvastatin  40 mg Oral BID  . calcitRIOL  0.5 mcg Oral Q M,W,F-HD  . collagenase  1 application Topical Daily  . feeding supplement (PRO-STAT SUGAR FREE 64)  30 mL Oral BID  . ferric citrate  420 mg Oral TID WC  . FLUoxetine  20 mg Oral Daily  . furosemide  40 mg Oral Once per day on Sun Tue Thu Sat  . gabapentin  300 mg Oral QHS  . heparin injection (subcutaneous)  5,000 Units Subcutaneous Q12H  . insulin aspart  0-5 Units Subcutaneous QHS  . insulin aspart  0-9 Units Subcutaneous TID WC  . levalbuterol  0.63 mg Inhalation TID  . lidocaine-prilocaine  1 application Topical Q M,W,F-HD  . metoprolol tartrate  12.5 mg Oral BID  . multivitamin  1 tablet Oral QHS  . mupirocin ointment   Nasal  BID  . nicotine  21 mg Transdermal Daily  . oxyCODONE  10 mg Oral Q12H  . pantoprazole  40 mg Oral Daily  . umeclidinium bromide  1 puff Inhalation Daily  . vancomycin  125 mg Oral Q6H   Continuous Infusions: . sodium chloride Stopped (06/16/18 2325)  . vancomycin 1,000 mg (06/15/18 1110)     LOS: 5 days        Ernestina Joe Gerome Apley, MD

## 2018-06-17 NOTE — Progress Notes (Signed)
[facility] Progress Note   Subjective:  Seen in room, no c/o.   Objective Vitals:   06/17/18 0849 06/17/18 0908 06/17/18 0914 06/17/18 0922  BP:      Pulse: 79 79  78  Resp: (!) 24  19   Temp:    99.2 F (37.3 C)  TempSrc:    Oral  SpO2: 92%  (!) 73% 90%  Weight:      Height:        Physical Exam General: Chronically ill appearing male NAD Lungs: Barrel chest Decreased air movement, occasional wheeze  Heart: RRR with S1 S2 Abdomen: soft NT + BS Lower extremities: Trace  LE edema , large dressing L groin postop, L great toe ulcerated Dialysis Access: R forearm AVF +bruit; R IJ TDC     Home meds  - aspirin 81/ atorvastatin 40 bid/ sl ntg prn  - amiodarone 200 bid/ metoprolol 12.5 bid  - insulin glargine 8- 12 ug qd/ insulin lispro 0-32 units ac tid  - esomeprazole 40 qd/ ferric citrate 420mg  ac  - fluoxetine 20 qd/ gabapentin 300 hs/ oxy IR 30mg  prn/ oxy-aceta prn  - furosemide 40mg  on non HD days  - tiotropium 18 ug qd/ levalbuterol 0.63 bid   - prn's/ vitamins    [place] MWF  4h   350/1.5   78kg    2/2.25 bath   P2   R AVF/ TDC still in   Hep none Venofer 100 mg IV x 10 (until 3/23) Calcitriol 0.5 mcg PO TIW   Assessment/Plan: 1. PAD/ infected fem-pop LLE graft (placed 05/04/18 for ischemic toe ulcer)- SP removal 06/14/18 with new saphenous vein patch from common femoral to profundus femoris artery. Per VVS. Wound cx+ MRSA, on IV Vanc. Fevers better.  2. ESRD - HD MWF. HD Monday. Advised by OP unit that may need fistulogram d/t cannulation issus. Has TDC in. No issues used AVF 2 needles on 3/18 and 3/20 here w/o difficulty.  3. Hypertension/volume  - BP good. No vol ^ on exam.   4. Anemia  - Hgb 10.7. No ESA needs. Hold IV Fe while getting antibiotics  5. Metabolic bone disease -  Continue VDRA/binders 6. Nutrition - Prot supp for low albumin  7. Chronic syst CHF - EF 25-30% 8. COPD - has O2 at home 9. DM2 - on insulin 10. Chronic afib - on amio/  BB at home and here, on Eliquis in the past per H&P. Will d/w primary.  [facility] 06/15/2018, 1:22 PM  Weight change:    Additional Objective Labs: Basic Metabolic Panel: Recent Labs  Lab 06/12/18 0536 06/13/18 1430 06/15/18 0657  NA 134* 132* 130*  K 3.7 3.9 4.6  CL 92* 92* 94*  CO2 30 26 24   GLUCOSE 108* 113* 119*  BUN 31* 41* 35*  CREATININE 4.63* 5.90* 5.76*  CALCIUM 9.4 9.0 8.5*  PHOS  --  3.7 3.4   CBC: Recent Labs  Lab 06/11/18 1633 06/12/18 0536 06/13/18 1440 06/15/18 0657 06/16/18 0214  WBC 8.3 9.0 7.7 10.3 8.8  NEUTROABS 6.1  --   --   --   --   HGB 12.0* 10.7* 13.4 8.9* 8.7*  HCT 40.5 34.7* 44.1 29.4* 28.8*  MCV 83.9 83.2 81.8 84.0 83.5  PLT 104* 101* 92* 118* 112*   Blood Culture    Component Value Date/Time   SDES WOUND LEFT GROIN 06/14/2018 1055   SPECREQUEST PATIENT  ON FOLLOWING VANC 06/14/2018 1055   CULT  06/14/2018 1055    FEW METHICILLIN RESISTANT STAPHYLOCOCCUS AUREUS NO ANAEROBES ISOLATED; CULTURE IN PROGRESS FOR 5 DAYS    REPTSTATUS PENDING 06/14/2018 1055     Medications: . sodium chloride Stopped (06/16/18 2325)  . vancomycin 1,000 mg (06/15/18 1110)   . amiodarone  200 mg Oral BID  . aspirin EC  81 mg Oral Daily  . atorvastatin  40 mg Oral BID  . calcitRIOL  0.5 mcg Oral Q M,W,F-HD  . collagenase  1 application Topical Daily  . feeding supplement (PRO-STAT SUGAR FREE 64)  30 mL Oral BID  . ferric citrate  420 mg Oral TID WC  . FLUoxetine  20 mg Oral Daily  . furosemide  40 mg Oral Once per day on Sun Tue Thu Sat  . gabapentin  300 mg Oral QHS  . heparin injection (subcutaneous)  5,000 Units Subcutaneous Q12H  . insulin aspart  0-5 Units Subcutaneous QHS  . insulin aspart  0-9 Units Subcutaneous TID WC  . levalbuterol  0.63 mg Inhalation TID  . lidocaine-prilocaine  1 application Topical Q M,W,F-HD  . metoprolol tartrate  12.5 mg Oral BID  . multivitamin  1 tablet Oral QHS  .  mupirocin ointment   Nasal BID  . nicotine  21 mg Transdermal Daily  . pantoprazole  40 mg Oral Daily  . umeclidinium bromide  1 puff Inhalation Daily  . vancomycin  125 mg Oral Q6H

## 2018-06-17 NOTE — Progress Notes (Signed)
    Subjective  - POD #3  Feels better Ambulated yesterday   Physical Exam:  Dressings changed.Some eschar in the AK incision, but good granulation tissue.  Significant drainage from groin incision.  Artery NOT exposed       Assessment/Plan:  POD #3  Continue ABX for MRSA Will likely need wound vac to groin tomorrow to help control drainage  Wells Brabham 06/17/2018 9:05 AM --  Vitals:   06/16/18 2042 06/17/18 0849  BP: (!) 110/52   Pulse: 65 79  Resp: 16 (!) 24  Temp: 98.3 F (36.8 C)   SpO2: 91% 92%    Intake/Output Summary (Last 24 hours) at 06/17/2018 0905 Last data filed at 06/16/2018 2325 Gross per 24 hour  Intake 160 ml  Output -  Net 160 ml     Laboratory CBC    Component Value Date/Time   WBC 8.8 06/16/2018 0214   HGB 8.7 (L) 06/16/2018 0214   HCT 28.8 (L) 06/16/2018 0214   PLT 112 (L) 06/16/2018 0214    BMET    Component Value Date/Time   NA 130 (L) 06/15/2018 0657   K 4.6 06/15/2018 0657   CL 94 (L) 06/15/2018 0657   CO2 24 06/15/2018 0657   GLUCOSE 119 (H) 06/15/2018 0657   BUN 35 (H) 06/15/2018 0657   CREATININE 5.76 (H) 06/15/2018 0657   CALCIUM 8.5 (L) 06/15/2018 0657   GFRNONAA 10 (L) 06/15/2018 0657   GFRAA 12 (L) 06/15/2018 0657    COAG Lab Results  Component Value Date   INR 1.23 05/01/2018   INR 1.08 02/13/2018   No results found for: PTT  Antibiotics Anti-infectives (From admission, onward)   Start     Dose/Rate Route Frequency Ordered Stop   06/13/18 1515  vancomycin (VANCOCIN) IVPB 1000 mg/200 mL premix     1,000 mg 200 mL/hr over 60 Minutes Intravenous  Once 06/13/18 1512 06/13/18 1729   06/13/18 1503  vancomycin (VANCOCIN) 1-5 GM/200ML-% IVPB    Note to Pharmacy:  Rodell Perna   : cabinet override      06/13/18 1503 06/13/18 1629   06/13/18 1200  vancomycin (VANCOCIN) 1,000 mg in sodium chloride 0.9 % 250 mL IVPB     1,000 mg 250 mL/hr over 60 Minutes Intravenous Every M-W-F (Hemodialysis) 06/13/18 0756      06/12/18 1430  vancomycin (VANCOCIN) 50 mg/mL oral solution 125 mg     125 mg Oral Every 6 hours 06/12/18 1341     06/11/18 2030  vancomycin (VANCOCIN) 1,500 mg in sodium chloride 0.9 % 500 mL IVPB     1,500 mg 250 mL/hr over 120 Minutes Intravenous  Once 06/11/18 2019 06/11/18 2300   06/11/18 2018  vancomycin variable dose per unstable renal function (pharmacist dosing)  Status:  Discontinued      Does not apply See admin instructions 06/11/18 2019 06/13/18 1101   06/11/18 2000  vancomycin (VANCOCIN) 1,500 mg in sodium chloride 0.9 % 500 mL IVPB  Status:  Discontinued     1,500 mg 250 mL/hr over 120 Minutes Intravenous  Once 06/11/18 1905 06/11/18 1927       V. Leia Alf, M.D., [facility] [place] Office: [phone] Pager:  [phone]

## 2018-06-18 DIAGNOSIS — I5022 Chronic systolic (congestive) heart failure: Secondary | ICD-10-CM

## 2018-06-18 LAB — CBC WITH DIFFERENTIAL/PLATELET
Abs Immature Granulocytes: 0.04 10*3/uL (ref 0.00–0.07)
Basophils Absolute: 0 10*3/uL (ref 0.0–0.1)
Basophils Relative: 0 %
Eosinophils Absolute: 0.1 10*3/uL (ref 0.0–0.5)
Eosinophils Relative: 2 %
HCT: 29.5 % — ABNORMAL LOW (ref 39.0–52.0)
Hemoglobin: 9.1 g/dL — ABNORMAL LOW (ref 13.0–17.0)
Immature Granulocytes: 1 %
Lymphocytes Relative: 14 %
Lymphs Abs: 1 10*3/uL (ref 0.7–4.0)
MCH: 25.5 pg — AB (ref 26.0–34.0)
MCHC: 30.8 g/dL (ref 30.0–36.0)
MCV: 82.6 fL (ref 80.0–100.0)
MONO ABS: 0.6 10*3/uL (ref 0.1–1.0)
Monocytes Relative: 8 %
Neutro Abs: 5.4 10*3/uL (ref 1.7–7.7)
Neutrophils Relative %: 75 %
Platelets: 167 10*3/uL (ref 150–400)
RBC: 3.57 MIL/uL — AB (ref 4.22–5.81)
RDW: 16.5 % — ABNORMAL HIGH (ref 11.5–15.5)
WBC: 7.1 10*3/uL (ref 4.0–10.5)
nRBC: 0 % (ref 0.0–0.2)

## 2018-06-18 LAB — GLUCOSE, CAPILLARY
GLUCOSE-CAPILLARY: 113 mg/dL — AB (ref 70–99)
GLUCOSE-CAPILLARY: 97 mg/dL (ref 70–99)
Glucose-Capillary: 99 mg/dL (ref 70–99)
Glucose-Capillary: 122 mg/dL — ABNORMAL HIGH (ref 70–99)

## 2018-06-18 LAB — BASIC METABOLIC PANEL
Anion gap: 12 (ref 5–15)
BUN: 52 mg/dL — AB (ref 6–20)
CO2: 27 mmol/L (ref 22–32)
Calcium: 8.9 mg/dL (ref 8.9–10.3)
Chloride: 87 mmol/L — ABNORMAL LOW (ref 98–111)
Creatinine, Ser: 6.16 mg/dL — ABNORMAL HIGH (ref 0.61–1.24)
GFR calc Af Amer: 11 mL/min — ABNORMAL LOW (ref >60)
GFR calc non Af Amer: 9 mL/min — ABNORMAL LOW (ref >60)
Glucose, Bld: 106 mg/dL — ABNORMAL HIGH (ref 70–99)
Potassium: 5 mmol/L (ref 3.5–5.1)
SODIUM: 126 mmol/L — AB (ref 135–145)

## 2018-06-18 MED ORDER — CHLORHEXIDINE GLUCONATE CLOTH 2 % EX PADS
6.0000 | MEDICATED_PAD | Freq: Every day | CUTANEOUS | Status: DC
  Administered 2018-06-18 – 2018-06-20 (×3): 6 via TOPICAL

## 2018-06-18 MED ORDER — CALCITRIOL 0.5 MCG PO CAPS
ORAL_CAPSULE | ORAL | Status: AC
  Filled 2018-06-18: qty 1

## 2018-06-18 MED ORDER — VANCOMYCIN HCL IN DEXTROSE 1-5 GM/200ML-% IV SOLN
INTRAVENOUS | Status: AC
  Administered 2018-06-18: 1000 mg
  Filled 2018-06-18: qty 200

## 2018-06-18 NOTE — Progress Notes (Signed)
Gave report to hemodialysis on patient in room 4E-25. Lajoyce Corners, RN

## 2018-06-18 NOTE — Progress Notes (Signed)
Patient retrieved for dialysis by transport. Lajoyce Corners, RN

## 2018-06-18 NOTE — Anesthesia Postprocedure Evaluation (Signed)
Anesthesia Post Note  Patient: Jose Sellers  Procedure(s) Performed: IRRIGATION AND DEBRIDEMENT LEFT GROIN WOUND AND LEFT POPLITEAL WOUND. (Left Leg Upper) Removal of Infected Left Femoral popliteal Bypass Graft  with vein patch angioplasty of common femoral and Popiteal artery (Left Leg Upper)     Patient location during evaluation: PACU Anesthesia Type: General Level of consciousness: sedated and patient cooperative Pain management: pain level controlled Vital Signs Assessment: post-procedure vital signs reviewed and stable Respiratory status: spontaneous breathing Cardiovascular status: stable Anesthetic complications: no    Last Vitals:  Vitals:   06/18/18 1236 06/18/18 1237  BP:    Pulse:    Resp: 17 (!) 21  Temp:    SpO2:      Last Pain:  Vitals:   06/18/18 1234  TempSrc:   PainSc: 0-No pain                 Nolon Nations

## 2018-06-18 NOTE — Procedures (Signed)
I was present at this dialysis session. I have reviewed the session itself and made appropriate changes.   Vital signs in last 24 hours:  Temp:  [97.6 F (36.4 C)-99.2 F (37.3 C)] 98.2 F (36.8 C) (03/23 0730) Pulse Rate:  [58-79] 60 (03/23 0800) Resp:  [11-24] 11 (03/23 0745) BP: (104-136)/(63-78) 136/63 (03/23 0800) SpO2:  [73 %-100 %] 97 % (03/23 0730) Weight:  [85.4 kg] 85.4 kg (03/23 0730) Weight change:  Filed Weights   06/15/18 1155 06/16/18 0510 06/18/18 0730  Weight: 79.2 kg 84.3 kg 85.4 kg    Recent Labs  Lab 06/15/18 0657 06/18/18 0333  NA 130* 126*  K 4.6 5.0  CL 94* 87*  CO2 24 27  GLUCOSE 119* 106*  BUN 35* 52*  CREATININE 5.76* 6.16*  CALCIUM 8.5* 8.9  PHOS 3.4  --     Recent Labs  Lab 06/11/18 1633  06/15/18 0657 06/16/18 0214 06/18/18 0333  WBC 8.3   < > 10.3 8.8 7.1  NEUTROABS 6.1  --   --   --  5.4  HGB 12.0*   < > 8.9* 8.7* 9.1*  HCT 40.5   < > 29.4* 28.8* 29.5*  MCV 83.9   < > 84.0 83.5 82.6  PLT 104*   < > 118* 112* 167   < > = values in this interval not displayed.    Scheduled Meds: . acetaminophen  500 mg Oral Q6H  . amiodarone  200 mg Oral BID  . aspirin EC  81 mg Oral Daily  . atorvastatin  40 mg Oral BID  . calcitRIOL  0.5 mcg Oral Q M,W,F-HD  . Chlorhexidine Gluconate Cloth  6 each Topical Q0600  . collagenase  1 application Topical Daily  . feeding supplement (PRO-STAT SUGAR FREE 64)  30 mL Oral BID  . ferric citrate  420 mg Oral TID WC  . FLUoxetine  20 mg Oral Daily  . furosemide  40 mg Oral Once per day on Sun Tue Thu Sat  . gabapentin  300 mg Oral QHS  . heparin injection (subcutaneous)  5,000 Units Subcutaneous Q12H  . insulin aspart  0-5 Units Subcutaneous QHS  . insulin aspart  0-9 Units Subcutaneous TID WC  . levalbuterol  0.63 mg Inhalation TID  . lidocaine-prilocaine  1 application Topical Q M,W,F-HD  . metoprolol tartrate  12.5 mg Oral BID  . multivitamin  1 tablet Oral QHS  . mupirocin ointment   Nasal BID   . nicotine  21 mg Transdermal Daily  . oxyCODONE  10 mg Oral Q12H  . pantoprazole  40 mg Oral Daily  . umeclidinium bromide  1 puff Inhalation Daily  . vancomycin  125 mg Oral Q6H   Continuous Infusions: . sodium chloride Stopped (06/16/18 2325)  . vancomycin 1,000 mg (06/15/18 1110)   PRN Meds:.hydrALAZINE, HYDROmorphone (DILAUDID) injection, ipratropium, nitroGLYCERIN, ondansetron **OR** ondansetron (ZOFRAN) IV, oxyCODONE, senna-docusate    Assessment and plan: 1. Infected L fem-pop graft s/p removal and relpacement with new saphenous vein patch but with serous drainage and for wound vac per VVS. 2. ESRD- cont with HD qMWF 3. Vascular access- using RAVF with 2 needles.  Hopefully can d/c TDC this week.  Donetta Potts,  MD 06/18/2018, 8:35 AM

## 2018-06-18 NOTE — Progress Notes (Signed)
Patient ID: Jose Sellers, male   DOB: 1960-08-19, 58 y.o.   MRN: [id number]  Progress Note    06/18/2018 10:16 AM 4 Days Post-Op  Subjective: Currently on hemodialysis.  Denies any pain in his left foot.   Vitals:   06/18/18 0930 06/18/18 1000  BP: (!) 117/58 115/62  Pulse: 66 62  Resp:    Temp:    SpO2:     Physical Exam: Left groin and popliteal incisions healing.  Does have a great deal of serous fluid drainage from his left groin.  CBC    Component Value Date/Time   WBC 7.1 06/18/2018 0333   RBC 3.57 (L) 06/18/2018 0333   HGB 9.1 (L) 06/18/2018 0333   HCT 29.5 (L) 06/18/2018 0333   PLT 167 06/18/2018 0333   MCV 82.6 06/18/2018 0333   MCH 25.5 (L) 06/18/2018 0333   MCHC 30.8 06/18/2018 0333   RDW 16.5 (H) 06/18/2018 0333   LYMPHSABS 1.0 06/18/2018 0333   MONOABS 0.6 06/18/2018 0333   EOSABS 0.1 06/18/2018 0333   BASOSABS 0.0 06/18/2018 0333    BMET    Component Value Date/Time   NA 126 (L) 06/18/2018 0333   K 5.0 06/18/2018 0333   CL 87 (L) 06/18/2018 0333   CO2 27 06/18/2018 0333   GLUCOSE 106 (H) 06/18/2018 0333   BUN 52 (H) 06/18/2018 0333   CREATININE 6.16 (H) 06/18/2018 0333   CALCIUM 8.9 06/18/2018 0333   GFRNONAA 9 (L) 06/18/2018 0333   GFRAA 11 (L) 06/18/2018 0333    INR    Component Value Date/Time   INR 1.23 05/01/2018 1146     Intake/Output Summary (Last 24 hours) at 06/18/2018 1016 Last data filed at 06/18/2018 0430 Gross per 24 hour  Intake 784 ml  Output -  Net 784 ml     Assessment/Plan:  58 y.o. male stable overall.  Will begin VAC dressing to groin and popliteal incision     [name], MD FACS [facility] [phone] 06/18/2018 10:16 AM

## 2018-06-18 NOTE — Progress Notes (Signed)
OT Cancellation Note  Patient Details Name: SAAFIR ABDULLAH MRN: [id number] DOB: 15-Dec-1960   Cancelled Treatment:    Reason Eval/Treat Not Completed: Patient at procedure or test/ unavailable(Pt in HD. ) Will follow.  Malka So 06/18/2018, 8:08 AM  Nestor Lewandowsky, OTR/L Acute Rehabilitation Services Pager: [phone] Office: [phone]

## 2018-06-18 NOTE — Progress Notes (Signed)
PT Cancellation Note  Patient Details Name: Jose Sellers MRN: [id number] DOB: 12-25-1960   Cancelled Treatment:    Reason Eval/Treat Not Completed: Patient at procedure or test/unavailable(at HD)   Duncan Dull 06/18/2018, 7:51 AM Alben Deeds, PT DPT  Board Certified Neurologic Specialist [place] Pager [phone] Office [phone]

## 2018-06-18 NOTE — Consult Note (Signed)
[facility] Nurse wound consult note Patient receiving care in [place].  No visitors present. Reason for Consult: Left popliteal and left groin surgical incisions for VAC therapy Wound type: Surgical Measurement: Left groin measures 11 cm x 2.8 cm x 2 cm.  Left popliteal measures 8 cm x 2.1 cm x 2 cm.  The left groin 100% pink.  The left popliteal wound was 98% pink, 2% yellow in wound base Drainage (amount, consistency, odor) no odor, no induration. Heavy serous drainage from both wounds Periwound: intact Dressing procedure/placement/frequency: MWF NPWT by [facility] team.  One piece of black foam placed into each wound bed.  Wounds were bridged with an immediate seal obtained at 125 mmHg.  One barrier ring was used at the groin wound to enhance obtaining and maintaining a leak proof seal.  The patient tolerated the procedure very well. Val Riles, RN, MSN, CWOCN, CNS-BC, pager [phone]

## 2018-06-18 NOTE — Progress Notes (Signed)
[facility]  PROGRESS NOTE  Jose Sellers IRW:[id number] DOB: [birth year] DOA: 06/11/2018 PCP: Raina Mina., MD   Brief HPI:   58 year old male with history of chronic atrial fibrillation, chronic systolic CHF status post AICD, asthma, ischemic cardiomyopathy with EF 25%, COPD, diabetes mellitus type 2, peripheral arterial disease, dyslipidemia, ESRD on hemodialysis Monday Wednesday Friday came to the ED with left groin erythema and open wound in the medial thigh. Patient had left femoral endarterectomy with patch angioplasty and left femoral to above knee popliteal bypass on May 04, 2018.  He had signs of wound dehiscence on March 13 and failed outpatient therapy.  By March 16 patient had worsening medial thigh pain and positive bloody drainage.  The wound was found to be infected and patient was referred to hospital for IV antibiotic therapy.  On March 19 patient underwent removal of infected left femoral to popliteal Gore-Tex bypass and Dacron patch angioplasty of common profunda femoris artery and vein patch angioplasty of common femoral and profunda femoris artery and above-knee popliteal artery.   Subjective   Patient seen during hemodialysis.   Assessment/Plan:     1. Left groin wound-status post removal of left femoral to popliteal bypass.  On IV antibiotics per ID.Dr Eliseo Squires discussed with ID, with recent C. difficile infection in June ID recommended oral vancomycin 125 mg 4 times daily till patient is on antibiotics and 1 week after antibiotics are completed.  Wound culture growing MRSA, blood cultures x2- showed no growth.  2. Peripheral arterial disease-vascular surgery following.  He underwent extensive endarterectomy in the left common femoral with Dacron patch angioplasty with Gore-Tex bypass of Dacron to above-knee popliteal artery on 05/04/2018.  Due to concern for prosthetic graft infection he underwent removal of the graft as above.  3. Coronary artery  disease-stable, continue aspirin, Lipitor, metoprolol.  4. ESRD on hemodialysis Monday Wednesday Friday-nephrology is following.  5. Chronic systolic heart [id number]-3O echo on 10/31/2017 showed EF 25 to 30%, euvolemic.  Continue Lasix as per home regimen.  6. Diabetes mellitus type 2-last hemoglobin A1c was 6.0 on 05/01/2018.  Blood was well controlled.  Continue sliding scale insulin with NovoLog.  7. Hypertension-continue home medications metoprolol, Lasix.  PRN hydralazine.  8. COPD-stable, continue bronchodilators.  9. Chronic atrial fibrillation-rate is controlled, continue amiodarone.  Patient not on full anticoagulation. As per admitting physician's note patient was on Eliquis before which was discontinued by his cardiologist.     CBG: Recent Labs  Lab 06/17/18 0603 06/17/18 1121 06/17/18 1620 06/17/18 2049 06/18/18 0605  GLUCAP 105* 108* 114* 113* 113*    CBC: Recent Labs  Lab 06/11/18 1633 06/12/18 0536 06/13/18 1440 06/15/18 0657 06/16/18 0214 06/18/18 0333  WBC 8.3 9.0 7.7 10.3 8.8 7.1  NEUTROABS 6.1  --   --   --   --  5.4  HGB 12.0* 10.7* 13.4 8.9* 8.7* 9.1*  HCT 40.5 34.7* 44.1 29.4* 28.8* 29.5*  MCV 83.9 83.2 81.8 84.0 83.5 82.6  PLT 104* 101* 92* 118* 112* 671    Basic Metabolic Panel: Recent Labs  Lab 06/11/18 1633 06/12/18 0536 06/13/18 1430 06/15/18 0657 06/18/18 0333  NA 133* 134* 132* 130* 126*  K 4.2 3.7 3.9 4.6 5.0  CL 91* 92* 92* 94* 87*  CO2 28 30 26 24 27   GLUCOSE 124* 108* 113* 119* 106*  BUN 27* 31* 41* 35* 52*  CREATININE 4.22* 4.63* 5.90* 5.76* 6.16*  CALCIUM 9.4 9.4 9.0 8.5* 8.9  PHOS  --   --  3.7 3.4  --      DVT prophylaxis: Lovenox  Code Status: Full code  Family Communication: No family at bedside  Disposition Plan: likely home when medically ready for discharge     Consultants: Nephrology Vascular surgery  Procedures: March 19 patient underwent removal of infected left femoral to popliteal Gore-Tex bypass  and Dacron patch angioplasty of common and profunda femoris artery and vein patch angioplasty of common femoral and profundus femoris artery and above-knee popliteal artery    Antibiotics:   Anti-infectives (From admission, onward)   Start     Dose/Rate Route Frequency Ordered Stop   06/18/18 0925  vancomycin (VANCOCIN) 1-5 GM/200ML-% IVPB    Note to Pharmacy:  Ashley Akin   : cabinet override      06/18/18 0925 06/18/18 1022   06/13/18 1515  vancomycin (VANCOCIN) IVPB 1000 mg/200 mL premix     1,000 mg 200 mL/hr over 60 Minutes Intravenous  Once 06/13/18 1512 06/13/18 1729   06/13/18 1503  vancomycin (VANCOCIN) 1-5 GM/200ML-% IVPB    Note to Pharmacy:  Rodell Perna   : cabinet override      06/13/18 1503 06/13/18 1629   06/13/18 1200  vancomycin (VANCOCIN) 1,000 mg in sodium chloride 0.9 % 250 mL IVPB     1,000 mg 250 mL/hr over 60 Minutes Intravenous Every M-W-F (Hemodialysis) 06/13/18 0756     06/12/18 1430  vancomycin (VANCOCIN) 50 mg/mL oral solution 125 mg     125 mg Oral Every 6 hours 06/12/18 1341     06/11/18 2030  vancomycin (VANCOCIN) 1,500 mg in sodium chloride 0.9 % 500 mL IVPB     1,500 mg 250 mL/hr over 120 Minutes Intravenous  Once 06/11/18 2019 06/11/18 2300   06/11/18 2018  vancomycin variable dose per unstable renal function (pharmacist dosing)  Status:  Discontinued      Does not apply See admin instructions 06/11/18 2019 06/13/18 1101   06/11/18 2000  vancomycin (VANCOCIN) 1,500 mg in sodium chloride 0.9 % 500 mL IVPB  Status:  Discontinued     1,500 mg 250 mL/hr over 120 Minutes Intravenous  Once 06/11/18 1905 06/11/18 1927       Objective   Vitals:   06/18/18 0900 06/18/18 0930 06/18/18 1000 06/18/18 1030  BP: 119/62 (!) 117/58 115/62 122/69  Pulse: 60 66 62 60  Resp:      Temp:      TempSrc:      SpO2:      Weight:      Height:        Intake/Output Summary (Last 24 hours) at 06/18/2018 1048 Last data filed at 06/18/2018 0430 Gross per 24  hour  Intake 784 ml  Output -  Net 784 ml   Filed Weights   06/15/18 1155 06/16/18 0510 06/18/18 0730  Weight: 79.2 kg 84.3 kg 85.4 kg     Physical Examination:    General:  Appears in no acute distress  Cardiovascular: S1-S2, regular  Respiratory: Clear to auscultation bilaterally  Abdomen: Abdomen is soft, nontender, no organomegaly  Extremities: No edema of the lower extremities  Neurologic: Alert, oriented x3, no focal deficit noted     Data Reviewed: I have personally reviewed following labs and imaging studies   Recent Results (from the past 240 hour(s))  Blood culture (routine x 2)     Status: None   Collection Time: 06/11/18  4:52 PM  Result Value Ref Range Status   Specimen Description BLOOD LEFT  HAND  Final   Special Requests   Final    BOTTLES DRAWN AEROBIC AND ANAEROBIC Blood Culture adequate volume   Culture   Final    NO GROWTH 5 DAYS Performed at [facility], [address], [place], [place] [address]    Report Status 06/16/2018 FINAL  Final  Blood culture (routine x 2)     Status: None   Collection Time: 06/11/18  5:01 PM  Result Value Ref Range Status   Specimen Description BLOOD LEFT WRIST  Final   Special Requests   Final    BOTTLES DRAWN AEROBIC AND ANAEROBIC Blood Culture results may not be optimal due to an inadequate volume of blood received in culture bottles   Culture   Final    NO GROWTH 5 DAYS Performed at [facility], [place] [address], [place], [place] [address]    Report Status 06/16/2018 FINAL  Final  MRSA PCR Screening     Status: Abnormal   Collection Time: 06/12/18  7:52 AM  Result Value Ref Range Status   MRSA by PCR POSITIVE (A) NEGATIVE Final    Comment:        The GeneXpert MRSA Assay (FDA approved for NASAL specimens only), is one component of a comprehensive MRSA colonization surveillance program. It is not intended to diagnose MRSA infection nor to guide or monitor treatment for MRSA  infections. RESULT CALLED TO, READ BACK BY AND VERIFIED WITH: Lurline Hare RN 9:45 06/12/18 ([email or web address]) Performed at [facility], [place] [address] [place], [place] [address]   Aerobic/Anaerobic Culture (surgical/deep wound)     Status: None (Preliminary result)   Collection Time: 06/14/18 10:55 AM  Result Value Ref Range Status   Specimen Description WOUND LEFT GROIN  Final   Special Requests PATIENT ON FOLLOWING VANC  Final   Gram Stain   Final    FEW WBC PRESENT, PREDOMINANTLY PMN NO ORGANISMS SEEN Performed at [facility], [place] [address], [place], [place] [address]    Culture   Final    FEW METHICILLIN RESISTANT STAPHYLOCOCCUS AUREUS NO ANAEROBES ISOLATED; CULTURE IN PROGRESS FOR 5 DAYS    Report Status PENDING  Incomplete   Organism ID, Bacteria METHICILLIN RESISTANT STAPHYLOCOCCUS AUREUS  Final      Susceptibility   Methicillin resistant staphylococcus aureus - MIC*    CIPROFLOXACIN >=8 RESISTANT Resistant     ERYTHROMYCIN >=8 RESISTANT Resistant     GENTAMICIN <=0.5 SENSITIVE Sensitive     OXACILLIN >=4 RESISTANT Resistant     TETRACYCLINE <=1 SENSITIVE Sensitive     VANCOMYCIN <=0.5 SENSITIVE Sensitive     TRIMETH/SULFA <=10 SENSITIVE Sensitive     CLINDAMYCIN <=0.25 SENSITIVE Sensitive     RIFAMPIN <=0.5 SENSITIVE Sensitive     Inducible Clindamycin NEGATIVE Sensitive     * FEW METHICILLIN RESISTANT STAPHYLOCOCCUS AUREUS     Liver Function Tests: Recent Labs  Lab 06/11/18 1633 06/13/18 1430 06/15/18 0657  AST 30  --   --   ALT 17  --   --   ALKPHOS 155*  --   --   BILITOT 1.8*  --   --   PROT 6.9  --   --   ALBUMIN 2.8* 2.4* 2.3*      Studies: No results found.  Scheduled Meds: . acetaminophen  500 mg Oral Q6H  . amiodarone  200 mg Oral BID  . aspirin EC  81 mg Oral Daily  . atorvastatin  40 mg Oral BID  . calcitRIOL  0.5 mcg Oral Q M,W,F-HD  . Chlorhexidine Gluconate Cloth  6 each Topical Q0600  . collagenase  1 application Topical Daily   . feeding supplement (PRO-STAT SUGAR FREE 64)  30 mL Oral BID  . ferric citrate  420 mg Oral TID WC  . FLUoxetine  20 mg Oral Daily  . furosemide  40 mg Oral Once per day on Sun Tue Thu Sat  . gabapentin  300 mg Oral QHS  . heparin injection (subcutaneous)  5,000 Units Subcutaneous Q12H  . insulin aspart  0-5 Units Subcutaneous QHS  . insulin aspart  0-9 Units Subcutaneous TID WC  . levalbuterol  0.63 mg Inhalation TID  . lidocaine-prilocaine  1 application Topical Q M,W,F-HD  . metoprolol tartrate  12.5 mg Oral BID  . multivitamin  1 tablet Oral QHS  . mupirocin ointment   Nasal BID  . nicotine  21 mg Transdermal Daily  . oxyCODONE  10 mg Oral Q12H  . pantoprazole  40 mg Oral Daily  . umeclidinium bromide  1 puff Inhalation Daily  . vancomycin  125 mg Oral Q6H    Admission status: Inpatient: Based on patients clinical presentation and evaluation of above clinical data, I have made determination that patient meets Inpatient criteria at this time.  Time spent: 20 min  [facility] Pager [phone]. If 7PM-7AM, please contact night-coverage at [email or web address], Office  [phone]  password [email or web address]  06/18/2018, 10:48 AM  LOS: 6 days

## 2018-06-18 NOTE — Progress Notes (Signed)
Pharmacy Antibiotic Note  Jose Sellers is a 58 y.o. male with ESRD with scheduled HD MWF admitted on 06/11/2018 with cellulitis.  Pharmacy has been consulted for IV vancomycin dosing. Patient also receiving PO vancomycin per primary for C.diff prophylaxis  while on antibiotics and for 1 week after. WBC WNL , afebrile, wound cx grew MRSA.    Plan: Continue Vancomycin 1000mg  IV with HD on MWF F/u HD schedule, cxs, LOT/de-escalation, and levels prn   Height: 6' (182.9 cm) Weight: 188 lb 4.4 oz (85.4 kg) IBW/kg (Calculated) : 77.6  Temp (24hrs), Avg:97.8 F (36.6 C), Min:97.6 F (36.4 C), Max:98.2 F (36.8 C)  Recent Labs  Lab 06/11/18 1633 06/12/18 0536 06/13/18 1430 06/13/18 1440 06/15/18 0657 06/16/18 0214 06/18/18 0333  WBC 8.3 9.0  --  7.7 10.3 8.8 7.1  CREATININE 4.22* 4.63* 5.90*  --  5.76*  --  6.16*  LATICACIDVEN 1.4  --   --   --   --   --   --     Estimated Creatinine Clearance: 14.7 mL/min (A) (by C-G formula based on SCr of 6.16 mg/dL (H)).    Allergies  Allergen Reactions  . Albuterol Palpitations    SVT  . Prednisone Shortness Of Breath    Wife stated that patient develops breathing issues.  . Fluticasone Furoate-Vilanterol Palpitations  . Morphine Rash    Antimicrobials this admission: PO Vanc 3/17>> IV Vanc 3/16>>  Dose adjustments this admission: n/a  Microbiology results: 3/16 Bcx: ngtd 3/16 MRSA pcr + 3/19 Wound cx: MRSA  Thank you for allowing pharmacy to be a part of this patient's care.  Harrietta Guardian, PharmD PGY1 Pharmacy Resident 06/18/2018    11:14 AM Please check AMION for all [place] numbers

## 2018-06-19 DIAGNOSIS — Z95810 Presence of automatic (implantable) cardiac defibrillator: Secondary | ICD-10-CM

## 2018-06-19 LAB — GLUCOSE, CAPILLARY
Glucose-Capillary: 107 mg/dL — ABNORMAL HIGH (ref 70–99)
Glucose-Capillary: 101 mg/dL — ABNORMAL HIGH (ref 70–99)
Glucose-Capillary: 107 mg/dL — ABNORMAL HIGH (ref 70–99)
Glucose-Capillary: 128 mg/dL — ABNORMAL HIGH (ref 70–99)

## 2018-06-19 LAB — AEROBIC/ANAEROBIC CULTURE W GRAM STAIN (SURGICAL/DEEP WOUND)

## 2018-06-19 MED ORDER — VANCOMYCIN HCL 1000 MG IV SOLR: 1000.0000 mg | INTRAVENOUS | Status: AC

## 2018-06-19 MED ORDER — VANCOMYCIN 50 MG/ML ORAL SOLUTION: 125.0000 mg | Freq: Four times a day (QID) | ORAL | 0 refills | Status: AC

## 2018-06-19 NOTE — Discharge Summary (Signed)
Physician Discharge Summary  Jose Sellers WLS:[id number] DOB: [birth year] DOA: 06/11/2018  PCP: Raina Mina., MD  Admit date: 06/11/2018 Discharge date: 06/19/2018  Time spent: 30 minutes  Recommendations for Outpatient Follow-up:  1. Follow-up with vascular surgery 1 week  2. Patient to continue IV vancomycin 1 g, Monday Wednesday Friday with hemodialysis, stop date 06/24/2018    Discharge Diagnoses:  Principal Problem:   Wound infection Active Problems:   CAD (coronary artery disease)   Chronic systolic congestive heart failure (HCC)   COPD (chronic obstructive pulmonary disease) (HCC)   ESRD on dialysis (HCC)   GERD (gastroesophageal reflux disease)   Hypercholesterolemia   Essential hypertension   Tobacco use   Uncontrolled type 2 diabetes mellitus with diabetic polyneuropathy, with long-term current use of insulin (HCC)   AICD (automatic cardioverter/defibrillator) present   PAD (peripheral artery disease) (HCC)   Open wound of left thigh   Chronic atrial fibrillation   Cellulitis of left groin   Thrombocytopenia (HCC)   Left groin pain   Discharge Condition: Stable  Diet recommendation: Carb modified diet  Filed Weights   06/16/18 0510 06/18/18 0730 06/18/18 1139  Weight: 84.3 kg 85.4 kg 81 kg    History of present illness:  58 year old male with history of chronic atrial fibrillation, chronic systolic CHF status post AICD, asthma, ischemic cardiomyopathy with EF 25%, COPD, diabetes mellitus type 2, peripheral arterial disease, dyslipidemia, ESRD on hemodialysis Monday Wednesday Friday came to the ED with left groin erythema and open wound in the medial thigh. Patient had left femoral endarterectomy with patch angioplasty and left femoral to above knee popliteal bypass on May 04, 2018.  He had signs of wound dehiscence on March 13 and failed outpatient therapy.  By March 16 patient had worsening medial thigh pain and positive bloody drainage.  The wound was  found to be infected and patient was referred to hospital for IV antibiotic therapy.  On March 19 patient underwent removal of infected left femoral to popliteal Gore-Tex bypass and Dacron patch angioplasty of common profunda femoris artery and vein patch angioplasty of common femoral and profunda femoris artery and above-knee popliteal artery.  Hospital Course:   1. Left groin wound-status post removal of left femoral to popliteal bypass.  On IV antibioticsVancomycin for total 2 weeks.  Stop date 06/24/2018 .       Dr Eliseo Squires discussed with ID, with recent C. difficile infection in June ID recommended oral        vancomycin 125 mg 4 times daily till patient is on antibiotics and 1 week after antibiotics         are completed.  Wound culture growing MRSA, blood cultures x2- showed no growth.         Continue wound care/wound VAC at home.  Home health RN and PT will be set up  2. Patient to get IV vancomycin 1 g Monday Wednesday Friday with hemodialysis.  Stop date 06/24/2018  3. Continue p.o. vancomycin 125 mg 4 times a day for extra 1 week after completing IV vancomycin.  Stop date 07/01/2018  2. Peripheral arterial disease-vascular surgery following.  He underwent extensive endarterectomy in the left common femoral with Dacron patch angioplasty with Gore-Tex bypass of Dacron to above-knee popliteal artery on 05/04/2018.  Due to concern for prosthetic graft infection he underwent removal of the graft as above.  3. Coronary artery disease-stable, continue aspirin, Lipitor, metoprolol.  4. ESRD on hemodialysis Monday Wednesday Friday-nephrology is following.  5. Chronic  systolic heart [id number] echo on 10/31/2017 showed EF 25 to 30%, euvolemic.  Continue Lasix as per home regimen.  6. Diabetes mellitus type 2-last hemoglobin A1c was 6.0 on 05/01/2018.  Blood was well controlled.  Continue sliding scale insulin with NovoLog.  7. Hypertension-continue home medications metoprolol, Lasix.  PRN  hydralazine.  8. COPD-stable, continue bronchodilators.  9. Sustained VT status post ablation- continue amiodarone.    ICD in place.  Patient is followed by cardiologist at [facility].    Procedures: March 19 patient underwent removal of infected left femoralto popliteal Gore-Tex bypass and Dacron patch angioplasty of common and profunda femoris artery and vein patch angioplasty of common femoralandprofundusfemoris artery and above-knee popliteal artery  Consultations:  Nephrology  Vascular surgery  Discharge Exam: Vitals:   06/19/18 1020 06/19/18 1030  BP:    Pulse:    Resp: 18 18  Temp:    SpO2:      General: Appears in no acute distress Cardiovascular: S1-S2, regular Respiratory: Clear to auscultation bilaterally  Discharge Instructions    Allergies as of 06/19/2018      Reactions   Albuterol Palpitations   SVT   Prednisone Shortness Of Breath   Wife stated that patient develops breathing issues.   Fluticasone Furoate-vilanterol Palpitations   Morphine Rash      Medication List    TAKE these medications   amiodarone 200 MG tablet Commonly known as:  PACERONE Take 200 mg by mouth 2 (two) times daily.   aspirin 81 MG tablet Take 81 mg by mouth daily.   atorvastatin 40 MG tablet Commonly known as:  LIPITOR Take 40 mg by mouth 2 (two) times daily.   Auryxia 1 GM 210 MG(Fe) tablet Generic drug:  ferric citrate Take 420 mg by mouth 3 (three) times daily with meals.   calcitRIOL 0.25 MCG capsule Commonly known as:  ROCALTROL Take 0.25 mcg by mouth every Monday, Wednesday, and Friday with hemodialysis.   Dialyvite 800 0.8 MG Tabs Take 1 tablet by mouth daily.   esomeprazole 40 MG capsule Commonly known as:  NEXIUM Take 40 mg by mouth daily before breakfast.   FLUoxetine 20 MG capsule Commonly known as:  PROZAC Take 20 mg by mouth daily.   furosemide 40 MG tablet Commonly known as:  LASIX Take 40 mg by mouth See  admin instructions. Non dialysis days. Do not take on Monday, Wednesday and Friday   gabapentin 300 MG capsule Commonly known as:  NEURONTIN Take 1 capsule (300 mg total) by mouth 3 (three) times daily. What changed:  when to take this   HumaLOG KwikPen 100 UNIT/ML KwikPen Generic drug:  insulin lispro Inject 0-32 Units into the skin 3 (three) times daily. Sliding Scale Insulin   Lantus SoloStar 100 UNIT/ML Solostar Pen Generic drug:  Insulin Glargine Inject 8-12 Units into the skin at bedtime as needed (blood sugar).   levalbuterol 0.63 MG/3ML nebulizer solution Commonly known as:  XOPENEX Inhale 0.63 mg into the lungs 2 (two) times daily.   lidocaine-prilocaine cream Commonly known as:  EMLA Apply 1 application topically See admin instructions. Apply small amount to access site 1-2 hours prior to dialysis.   metoprolol tartrate 25 MG tablet Commonly known as:  LOPRESSOR Take 12.5 mg by mouth 2 (two) times daily.   nitroGLYCERIN 0.4 MG SL tablet Commonly known as:  NITROSTAT Place 0.4 mg under the tongue every 5 (five) minutes as needed for chest pain.   oxycodone 30 MG immediate  release tablet Commonly known as:  ROXICODONE Take 30 mg by mouth every 8 (eight) hours as needed for pain.   oxyCODONE-acetaminophen 7.5-325 MG tablet Commonly known as:  PERCOCET Take 1 tablet by mouth every 6 (six) hours as needed for severe pain.   Santyl ointment Generic drug:  collagenase Apply 1 application topically daily.   tiotropium 18 MCG inhalation capsule Commonly known as:  SPIRIVA Place 18 mcg into inhaler and inhale daily.   vancomycin 1,000 mg in sodium chloride 0.9 % 250 mL Inject 1,000 mg into the vein every Monday, Wednesday, and Friday with hemodialysis for 4 days. Start taking on:  June 20, 2018   vancomycin 50 mg/mL  oral solution Commonly known as:  VANCOCIN Take 2.5 mLs (125 mg total) by mouth every 6 (six) hours for 12 days.      Allergies  Allergen  Reactions  . Albuterol Palpitations    SVT  . Prednisone Shortness Of Breath    Wife stated that patient develops breathing issues.  . Fluticasone Furoate-Vilanterol Palpitations  . Morphine Rash      The results of significant diagnostics from this hospitalization (including imaging, microbiology, ancillary and laboratory) are listed below for reference.    Significant Diagnostic Studies: Vas [place] Groin Pseudoaneurysm  Result Date: 06/11/2018  ARTERIAL PSEUDOANEURYSM  Exam: Left groin lump History: Left femoral to above knee pop bypass graft, Left femoral endarterectomy 05/04/2018. Performing Technologist: Alvia Grove RVT  Examination Guidelines: A complete evaluation includes B-mode imaging, spectral Doppler, color Doppler, and power Doppler as needed of all accessible portions of each vessel. Bilateral testing is considered an integral part of a complete examination. Limited examinations for reoccurring indications may be performed as noted. +------------+----------+----------+------+----------+ Right DuplexPSV (cm/s) Waveform PlaqueComment(s) +------------+----------+----------+------+----------+ CFA            113    monophasic        brisk    +------------+----------+----------+------+----------+ +-----------+----------+--------+------+----------+ Left DuplexPSV (cm/s)WaveformPlaqueComment(s) +-----------+----------+--------+------+----------+ CFA            39    biphasic        graft    +-----------+----------+--------+------+----------+ Left Vein comments:  Summary: Multilobular fluid collection in the left groin at site of lump with the largest measurement of 3.38 x 3.64 with no color flow visualized., No evidence of DVT in the CFV.  Diagnosing physician: Harold Barban MD Electronically signed by Harold Barban MD on 06/11/2018 at 4:00:47 PM.   --------------------------------------------------------------------------------    Final     Microbiology: Recent Results  (from the past 240 hour(s))  Blood culture (routine x 2)     Status: None   Collection Time: 06/11/18  4:52 PM  Result Value Ref Range Status   Specimen Description BLOOD LEFT HAND  Final   Special Requests   Final    BOTTLES DRAWN AEROBIC AND ANAEROBIC Blood Culture adequate volume   Culture   Final    NO GROWTH 5 DAYS Performed at [facility], [address], [place], [place] [address]    Report Status 06/16/2018 FINAL  Final  Blood culture (routine x 2)     Status: None   Collection Time: 06/11/18  5:01 PM  Result Value Ref Range Status   Specimen Description BLOOD LEFT WRIST  Final   Special Requests   Final    BOTTLES DRAWN AEROBIC AND ANAEROBIC Blood Culture results may not be optimal due to an inadequate volume of blood received in culture bottles   Culture   Final  NO GROWTH 5 DAYS Performed at [facility], [place] [address], [place], [place] [address]    Report Status 06/16/2018 FINAL  Final  MRSA PCR Screening     Status: Abnormal   Collection Time: 06/12/18  7:52 AM  Result Value Ref Range Status   MRSA by PCR POSITIVE (A) NEGATIVE Final    Comment:        The GeneXpert MRSA Assay (FDA approved for NASAL specimens only), is one component of a comprehensive MRSA colonization surveillance program. It is not intended to diagnose MRSA infection nor to guide or monitor treatment for MRSA infections. RESULT CALLED TO, READ BACK BY AND VERIFIED WITH: Lurline Hare RN 9:45 06/12/18 ([email or web address]) Performed at [facility], [place] [address] [place], [place] [address]   Aerobic/Anaerobic Culture (surgical/deep wound)     Status: None   Collection Time: 06/14/18 10:55 AM  Result Value Ref Range Status   Specimen Description WOUND LEFT GROIN  Final   Special Requests PATIENT ON FOLLOWING VANC  Final   Gram Stain   Final    FEW WBC PRESENT, PREDOMINANTLY PMN NO ORGANISMS SEEN    Culture   Final    FEW METHICILLIN RESISTANT STAPHYLOCOCCUS AUREUS NO ANAEROBES  ISOLATED Performed at [facility], [place] [address], [place], [place] [address]    Report Status 06/19/2018 FINAL  Final   Organism ID, Bacteria METHICILLIN RESISTANT STAPHYLOCOCCUS AUREUS  Final      Susceptibility   Methicillin resistant staphylococcus aureus - MIC*    CIPROFLOXACIN >=8 RESISTANT Resistant     ERYTHROMYCIN >=8 RESISTANT Resistant     GENTAMICIN <=0.5 SENSITIVE Sensitive     OXACILLIN >=4 RESISTANT Resistant     TETRACYCLINE <=1 SENSITIVE Sensitive     VANCOMYCIN <=0.5 SENSITIVE Sensitive     TRIMETH/SULFA <=10 SENSITIVE Sensitive     CLINDAMYCIN <=0.25 SENSITIVE Sensitive     RIFAMPIN <=0.5 SENSITIVE Sensitive     Inducible Clindamycin NEGATIVE Sensitive     * FEW METHICILLIN RESISTANT STAPHYLOCOCCUS AUREUS     Labs: Basic Metabolic Panel: Recent Labs  Lab 06/13/18 1430 06/15/18 0657 06/18/18 0333  NA 132* 130* 126*  K 3.9 4.6 5.0  CL 92* 94* 87*  CO2 26 24 27   GLUCOSE 113* 119* 106*  BUN 41* 35* 52*  CREATININE 5.90* 5.76* 6.16*  CALCIUM 9.0 8.5* 8.9  PHOS 3.7 3.4  --    Liver Function Tests: Recent Labs  Lab 06/13/18 1430 06/15/18 0657  ALBUMIN 2.4* 2.3*   No results for input(s): LIPASE, AMYLASE in the last 168 hours. No results for input(s): AMMONIA in the last 168 hours. CBC: Recent Labs  Lab 06/13/18 1440 06/15/18 0657 06/16/18 0214 06/18/18 0333  WBC 7.7 10.3 8.8 7.1  NEUTROABS  --   --   --  5.4  HGB 13.4 8.9* 8.7* 9.1*  HCT 44.1 29.4* 28.8* 29.5*  MCV 81.8 84.0 83.5 82.6  PLT 92* 118* 112* 167   Cardiac Enzymes: No results for input(s): CKTOTAL, CKMB, CKMBINDEX, TROPONINI in the last 168 hours. BNP: BNP (last 3 results) No results for input(s): BNP in the last 8760 hours.  ProBNP (last 3 results) No results for input(s): PROBNP in the last 8760 hours.  CBG: Recent Labs  Lab 06/18/18 1225 06/18/18 1615 06/18/18 2125 06/19/18 0624 06/19/18 1117  GLUCAP 97 99 122* 107* 101*       Signed:  Oswald Hillock  MD.  [facility] 06/19/2018, 12:53  PM   

## 2018-06-19 NOTE — Progress Notes (Signed)
Pt with hx of ESRD, DM- s/p removal of infected L fem-pop PTFE bypass with wound VAC placement. Pt on carb. Modified diet with feeding supplements (pro-stat sugar free) twice daily and renal multi vitamin daily. Pt will continue on IV abx with HD M/W/F with stop date 06/24/18. Pt should have adequate nutrition for wound healing.

## 2018-06-19 NOTE — TOC Progression Note (Signed)
Transition of Care [facility]) - Progression Note  [phone] Transitions of Care RN Case Manager  Patient Details  Name: Jose Sellers MRN: [id number] Date of Birth: 1961-03-26  Transition of Care [facility] CM/SW Contact  Marvetta Gibbons [place], [place] Phone Number: [phone] 06/19/2018, 3:39 PM  Clinical Narrative:    Pt s/p removal of infected L fem-pop PTFE bypass with wound vac applied, pt will need IV vanc with HD end date of 3/29. Also referral for Home wound VAC received. [facility] wound VAC form signed, and CM spoke with pt at bedside to discuss transition of care needs-list provided Per CMS guidelines from medicare.gov website with star ratings (copy placed in shadow chart) for [facility] agency choice - pet pt he has selected first and second choice and then if those agencies can not accept has instructed CM to find agency that can provide needed services. Call made to first choice which was not in network with pt's insurance and second choice was unable to provide staffing for nursing. Call then made to [facility] which has accepted referral for HHRN/PT needs. DME form faxed to [facility] with [place] for home wound VAC- per Brad home wound VAC will not be able to be delivered today, but will be delivered in the am on 3/25, pt and bedside RN made aware of transition plan, per MD pt is ready for discharge once home wound VAC has been delivered- Butch Penny with [facility] will come to bedside to place home wound VAC to pt prior to discharge), pt reports that his wife will provide transport home.   Expected Discharge Plan: [place] Barriers to Discharge: Equipment Delay  Expected Discharge Plan and Services Expected Discharge Plan: [place]   Discharge Planning Services: CM Consult Post Acute Care Choice: Home Health, Durable Medical Equipment Living arrangements for the past 2 months: Single Family Home                 DME Arranged: Vac DME Agency: [facility] HH Arranged:  RN, PT [place] Agency: [place] [facility]   Social Determinants of Health (SDOH) Interventions    Readmission Risk Interventions No flowsheet data found.

## 2018-06-19 NOTE — Progress Notes (Signed)
Patient ID: KOAH CHISENHALL, male   DOB: [birth year], 58 y.o.   MRN: [id number]  [facility] Progress Note    Subjective:   Feels well, no complaints   Objective:   BP 140/74 (BP Location: Left Arm)   Pulse 76   Temp 97.9 F (36.6 C) (Oral)   Resp 11   Ht 6' (1.829 m)   Wt 81 kg   SpO2 96%   BMI 24.22 kg/m   Intake/Output: I/O last 3 completed shifts: In: 684 [P.K.:938] Out: 1829 [Drains:150; Other:4000]   Intake/Output this shift:  No intake/output data recorded. Weight change:   Physical Exam: Gen: NAD HBZ:JIRC +T/B, left leg wound vac in place  Labs: BMET Recent Labs  Lab 06/13/18 1430 06/15/18 0657 06/18/18 0333  NA 132* 130* 126*  K 3.9 4.6 5.0  CL 92* 94* 87*  CO2 26 24 27   GLUCOSE 113* 119* 106*  BUN 41* 35* 52*  CREATININE 5.90* 5.76* 6.16*  ALBUMIN 2.4* 2.3*  --   CALCIUM 9.0 8.5* 8.9  PHOS 3.7 3.4  --    CBC Recent Labs  Lab 06/13/18 1440 06/15/18 0657 06/16/18 0214 06/18/18 0333  WBC 7.7 10.3 8.8 7.1  NEUTROABS  --   --   --  5.4  HGB 13.4 8.9* 8.7* 9.1*  HCT 44.1 29.4* 28.8* 29.5*  MCV 81.8 84.0 83.5 82.6  PLT 92* 118* 112* 167    @IMGRELPRIORS @ Medications:    . acetaminophen  500 mg Oral Q6H  . amiodarone  200 mg Oral BID  . aspirin EC  81 mg Oral Daily  . atorvastatin  40 mg Oral BID  . calcitRIOL  0.5 mcg Oral Q M,W,F-HD  . Chlorhexidine Gluconate Cloth  6 each Topical Q0600  . feeding supplement (PRO-STAT SUGAR FREE 64)  30 mL Oral BID  . ferric citrate  420 mg Oral TID WC  . FLUoxetine  20 mg Oral Daily  . furosemide  40 mg Oral Once per day on Sun Tue Thu Sat  . gabapentin  300 mg Oral QHS  . heparin injection (subcutaneous)  5,000 Units Subcutaneous Q12H  . insulin aspart  0-5 Units Subcutaneous QHS  . insulin aspart  0-9 Units Subcutaneous TID WC  . levalbuterol  0.63 mg Inhalation TID  . lidocaine-prilocaine  1 application Topical Q M,W,F-HD  . metoprolol tartrate  12.5 mg Oral BID  . multivitamin   1 tablet Oral QHS  . mupirocin ointment   Nasal BID  . nicotine  21 mg Transdermal Daily  . oxyCODONE  10 mg Oral Q12H  . pantoprazole  40 mg Oral Daily  . umeclidinium bromide  1 puff Inhalation Daily  . vancomycin  125 mg Oral Q6H   [place] MWF  4h   350/1.5   78kg    2/2.25 bath   P2   R AVF/ TDC still in   Hep none Venofer 100 mg IV x 10 (until 3/23) Calcitriol 0.5 mcg PO TIW  Assessment/ Plan:   1. PAD/infected left fem-pop graft placed 05/04/18 s/p removal 06/14/18 with new SVG patch and now with wound vac 2. ESRD continue HD qMWF 3. Vascular access- ravf functioning well.  Can d/c tdc next week if cont to function well. 4. HTN/Volume- stable 5. 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 COPD- has home O2 8. Hypertension:stable 9. Chronic systolic chf- EF 78-93% stable volume 10. Chronic afib- on amiodarone, beta-blocker  and eliquis prior to admission.  Donetta Potts, MD [place] Pager [phone] 06/19/2018, 9:21 AM

## 2018-06-19 NOTE — Progress Notes (Addendum)
  Progress Note    06/19/2018 7:41 AM 5 Days Post-Op  Subjective:  Denies rest pain L foot; pain in L groin incision   Vitals:   06/18/18 2016 06/19/18 0513  BP:  140/74  Pulse:  76  Resp:  11  Temp:  97.9 F (36.6 C)  SpO2: 97% 100%   Physical Exam: Lungs:  Non labored Incisions:  L groin and popliteal incisions with wound vac in place, good seal; 200c serosanguinous drainage in cannister Extremities:  L DP by doppler; L GT wound stable Neurologic: A&O  CBC    Component Value Date/Time   WBC 7.1 06/18/2018 0333   RBC 3.57 (L) 06/18/2018 0333   HGB 9.1 (L) 06/18/2018 0333   HCT 29.5 (L) 06/18/2018 0333   PLT 167 06/18/2018 0333   MCV 82.6 06/18/2018 0333   MCH 25.5 (L) 06/18/2018 0333   MCHC 30.8 06/18/2018 0333   RDW 16.5 (H) 06/18/2018 0333   LYMPHSABS 1.0 06/18/2018 0333   MONOABS 0.6 06/18/2018 0333   EOSABS 0.1 06/18/2018 0333   BASOSABS 0.0 06/18/2018 0333    BMET    Component Value Date/Time   NA 126 (L) 06/18/2018 0333   K 5.0 06/18/2018 0333   CL 87 (L) 06/18/2018 0333   CO2 27 06/18/2018 0333   GLUCOSE 106 (H) 06/18/2018 0333   BUN 52 (H) 06/18/2018 0333   CREATININE 6.16 (H) 06/18/2018 0333   CALCIUM 8.9 06/18/2018 0333   GFRNONAA 9 (L) 06/18/2018 0333   GFRAA 11 (L) 06/18/2018 0333    INR    Component Value Date/Time   INR 1.23 05/01/2018 1146     Intake/Output Summary (Last 24 hours) at 06/19/2018 0741 Last data filed at 06/19/2018 0630 Gross per 24 hour  Intake 240 ml  Output 4150 ml  Net -3910 ml     Assessment/Plan:  58 y.o. male is s/p removal of infected L fem-pop PTFE bypass 5 Days Post-Op   Continue wound vac; WOC to change MWF L DP by doppler L GT wound stable; continue to monitor ESRD on HD per Nephrology    Dagoberto Ligas, PA-C [facility] [phone] 06/19/2018 7:41 AM   I agree with the above.  Wound vacs functioning properly.  Continue current care  Annamarie Major

## 2018-06-19 NOTE — Progress Notes (Signed)
Physical Therapy Treatment Patient Details Name: Jose Sellers MRN: [id number] DOB: September 20, 1960 Today's Date: 06/19/2018    History of Present Illness Patient is 58 y/o male presenting to hospital with left groin wound infection. Patient recently s/p left femoral endarterectomy with patch angioplasty and left femoral to above-the-knee popliteal bypass with PTFE graft on 05/04/18. Removal of L fem-pop BPG 06/16/18.  Wound vac applied 3/22. PMH includes CAD, CHF, COPD, ESRD on HD MWF, GERD, HTN, DMII, AICD, PAD, afib.     PT Comments    Patient seen for mobility progression with newly added wound vac. Patient mobilizing well with use of RW. Continues to endorse increased pain in LLE. Current POC remains appropriate.   Follow Up Recommendations  Home health PT;Supervision/Assistance - 24 hour     Equipment Recommendations  3in1 (PT)    Recommendations for Other Services       Precautions / Restrictions Precautions Precautions: Fall Precaution Comments: woudn vac Restrictions Weight Bearing Restrictions: No    Mobility  Bed Mobility Overal bed mobility: Modified Independent Bed Mobility: Supine to Sit     Supine to sit: Modified independent (Device/Increase time)        Transfers Overall transfer level: Needs assistance Equipment used: Rolling walker (2 wheeled) Transfers: Sit to/from Stand Sit to Stand: Supervision         General transfer comment: verbal cues for hand placement  Ambulation/Gait Ambulation/Gait assistance: Min guard Gait Distance (Feet): 180 Feet Assistive device: Rolling walker (2 wheeled) Gait Pattern/deviations: Step-to pattern;Decreased step length - right;Decreased stance time - left;Decreased stride length;Decreased weight shift to left;Antalgic Gait velocity: decreased Gait velocity interpretation: <1.8 ft/sec, indicate of risk for recurrent falls General Gait Details: Min guard for safety   Stairs             Wheelchair  Mobility    Modified Rankin (Stroke Patients Only)       Balance Overall balance assessment: Needs assistance Sitting-balance support: Feet supported;No upper extremity supported Sitting balance-Leahy Scale: Good     Standing balance support: Bilateral upper extremity supported Standing balance-Leahy Scale: Fair Standing balance comment: able to release UE support                            Cognition Arousal/Alertness: Awake/alert Behavior During Therapy: WFL for tasks assessed/performed Overall Cognitive Status: Within Functional Limits for tasks assessed                                        Exercises      General Comments        Pertinent Vitals/Pain Pain Assessment: Faces Faces Pain Scale: Hurts little more Pain Location: LLE inner thigh Pain Descriptors / Indicators: Discomfort;Guarding;Burning Pain Intervention(s): Monitored during session    Home Living                      Prior Function            PT Goals (current goals can now be found in the care plan section) Acute Rehab PT Goals Patient Stated Goal: "get back to fishing" PT Goal Formulation: With patient/family Time For Goal Achievement: 06/26/18 Potential to Achieve Goals: Good Progress towards PT goals: Progressing toward goals    Frequency    Min 3X/week      PT Plan Current plan remains appropriate  Co-evaluation              AM-PAC PT "6 Clicks" Mobility   Outcome Measure  Help needed turning from your back to your side while in a flat bed without using bedrails?: None Help needed moving from lying on your back to sitting on the side of a flat bed without using bedrails?: None Help needed moving to and from a bed to a chair (including a wheelchair)?: A Little Help needed standing up from a chair using your arms (e.g., wheelchair or bedside chair)?: A Little Help needed to walk in hospital room?: A Little Help needed climbing 3-5  steps with a railing? : A Lot 6 Click Score: 19    End of Session Equipment Utilized During Treatment: Gait belt Activity Tolerance: Patient tolerated treatment well Patient left: in chair;with call bell/phone within reach(sitting EOB) Nurse Communication: Mobility status PT Visit Diagnosis: Other abnormalities of gait and mobility (R26.89);Muscle weakness (generalized) (M62.81);Difficulty in walking, not elsewhere classified (R26.2);History of falling (Z91.81)     Time: 0881-1031 PT Time Calculation (min) (ACUTE ONLY): 19 min  Charges:  $Gait Training: 8-22 mins                     Alben Deeds, PT DPT  Board Certified Neurologic Specialist [place] Pager [phone] Office [phone]    Duncan Dull 06/19/2018, 9:47 AM

## 2018-06-20 ENCOUNTER — Telehealth: Payer: Self-pay | Admitting: *Deleted

## 2018-06-20 DIAGNOSIS — D696 Thrombocytopenia, unspecified: Secondary | ICD-10-CM

## 2018-06-20 LAB — CBC
HEMATOCRIT: 31.7 % — AB (ref 39.0–52.0)
Hemoglobin: 9.5 g/dL — ABNORMAL LOW (ref 13.0–17.0)
MCH: 25.1 pg — ABNORMAL LOW (ref 26.0–34.0)
MCHC: 30 g/dL (ref 30.0–36.0)
MCV: 83.6 fL (ref 80.0–100.0)
Platelets: 219 10*3/uL (ref 150–400)
RBC: 3.79 MIL/uL — ABNORMAL LOW (ref 4.22–5.81)
RDW: 16.8 % — ABNORMAL HIGH (ref 11.5–15.5)
WBC: 8.5 10*3/uL (ref 4.0–10.5)
nRBC: 0 % (ref 0.0–0.2)

## 2018-06-20 LAB — RENAL FUNCTION PANEL
Albumin: 2.1 g/dL — ABNORMAL LOW (ref 3.5–5.0)
Anion gap: 11 (ref 5–15)
BUN: 45 mg/dL — ABNORMAL HIGH (ref 6–20)
CO2: 26 mmol/L (ref 22–32)
Calcium: 8.8 mg/dL — ABNORMAL LOW (ref 8.9–10.3)
Chloride: 90 mmol/L — ABNORMAL LOW (ref 98–111)
Creatinine, Ser: 5.61 mg/dL — ABNORMAL HIGH (ref 0.61–1.24)
GFR calc Af Amer: 12 mL/min — ABNORMAL LOW (ref >60)
GFR calc non Af Amer: 10 mL/min — ABNORMAL LOW (ref >60)
Glucose, Bld: 130 mg/dL — ABNORMAL HIGH (ref 70–99)
Phosphorus: 2.6 mg/dL (ref 2.5–4.6)
Potassium: 5 mmol/L (ref 3.5–5.1)
Sodium: 127 mmol/L — ABNORMAL LOW (ref 135–145)

## 2018-06-20 LAB — GLUCOSE, CAPILLARY: Glucose-Capillary: 104 mg/dL — ABNORMAL HIGH (ref 70–99)

## 2018-06-20 LAB — VANCOMYCIN, TROUGH: VANCOMYCIN TR: 16 ug/mL (ref 15–20)

## 2018-06-20 MED ORDER — VANCOMYCIN HCL IN DEXTROSE 1-5 GM/200ML-% IV SOLN
INTRAVENOUS | Status: AC
  Administered 2018-06-20: 1000 mg
  Filled 2018-06-20: qty 200

## 2018-06-20 MED ORDER — CALCITRIOL 0.5 MCG PO CAPS
ORAL_CAPSULE | ORAL | Status: AC
  Administered 2018-06-20: 0.5 ug via ORAL
  Filled 2018-06-20: qty 1

## 2018-06-20 MED ORDER — HEPARIN SODIUM (PORCINE) 1000 UNIT/ML IJ SOLN
INTRAMUSCULAR | Status: AC
  Administered 2018-06-20: 1600 [IU] via INTRAVENOUS_CENTRAL
  Filled 2018-06-20: qty 2

## 2018-06-20 MED ORDER — HEPARIN SODIUM (PORCINE) 1000 UNIT/ML DIALYSIS
20.0000 [IU]/kg | INTRAMUSCULAR | Status: DC | PRN
  Administered 2018-06-20: 1600 [IU] via INTRAVENOUS_CENTRAL
  Filled 2018-06-20: qty 1.6

## 2018-06-20 NOTE — TOC Transition Note (Signed)
Transition of Care [facility]) - CM/SW Discharge Note Marvetta Gibbons RN, BSN Transitions of Care Unit 4E- RN Case Manager [phone]  Patient Details  Name: Jose Sellers MRN: [id number] Date of Birth: 1960/06/08  Transition of Care [facility]) CM/SW Contact:  Dawayne Patricia, RN Phone Number: 06/20/2018, 1:26 PM   Clinical Narrative:   See prior note for hx, pt for transition home today post HD. Home wound VAC has been delivered to bedside for transition home. Butch Penny with [facility] has been notified and to come to bedside to convert VAC to home [facility]: [place] Rhonda W for discharge.     Final next level of care: [place] Barriers to Discharge: No Barriers Identified   Patient Goals and CMS Choice Patient states their goals for this hospitalization and ongoing recovery are:: "to get home and get better" CMS Medicare.gov Compare Post Acute Care list provided to:: Patient Choice offered to / list presented to : Patient  Discharge Placement  Home with Home Health and home wound [facility]                     Discharge Plan and Services   Discharge Planning Services: CM Consult Post Acute Care Choice: Home Health, Durable Medical Equipment          DME Arranged: Vac DME Agency: [facility] HH Arranged: RN, PT [place] Agency: [place] [facility]   Social Determinants of Health (SDOH) Interventions     Readmission Risk Interventions Readmission Risk Prevention Plan 06/20/2018 06/19/2018  Transportation Screening - Complete  PCP or Specialist Appt within 3-5 Days - Complete  HRI or [place] - Complete  Social Work Consult for [place] Planning/Counseling - Complete  Palliative Care Screening - Not Applicable  Medication Review Press photographer) - Complete  PCP or Specialist appointment within 3-5 days of discharge Complete -  [place] or Home Care Consult Complete -  SW Recovery Care/Counseling Consult Complete -  Palliative Care Screening Not Applicable -  [place] Not Applicable -  Some recent data might be hidden

## 2018-06-20 NOTE — Progress Notes (Signed)
OT Cancellation Note  Patient Details Name: STRIDER VALLANCE MRN: [id number] DOB: 1960-04-21   Cancelled Treatment:    Reason Eval/Treat Not Completed: Patient at procedure or test/ unavailable(HD)  Merri Ray Rainey Rodger 06/20/2018, 9:11 AM

## 2018-06-20 NOTE — Procedures (Signed)
I was present at this dialysis session. I have reviewed the session itself and made appropriate changes.  Unfortunately the RN did not follow orders to use AVF and he is not on HD catheter, however it has worked well for the past week and will use as an outpatient and then DC HD cath as an outpatient.   Vital signs in last 24 hours:  Temp:  [97.7 F (36.5 C)-98.3 F (36.8 C)] 97.7 F (36.5 C) (03/24 2038) Pulse Rate:  [41-62] 62 (03/24 2038) Resp:  [12-21] 13 (03/24 2038) BP: (117-142)/(60-75) 142/75 (03/24 2038) SpO2:  [61 %-100 %] 92 % (03/24 2038) Weight change:  Filed Weights   06/16/18 0510 06/18/18 0730 06/18/18 1139  Weight: 84.3 kg 85.4 kg 81 kg    Recent Labs  Lab 06/15/18 0657 06/18/18 0333  NA 130* 126*  K 4.6 5.0  CL 94* 87*  CO2 24 27  GLUCOSE 119* 106*  BUN 35* 52*  CREATININE 5.76* 6.16*  CALCIUM 8.5* 8.9  PHOS 3.4  --     Recent Labs  Lab 06/15/18 0657 06/16/18 0214 06/18/18 0333  WBC 10.3 8.8 7.1  NEUTROABS  --   --  5.4  HGB 8.9* 8.7* 9.1*  HCT 29.4* 28.8* 29.5*  MCV 84.0 83.5 82.6  PLT 118* 112* 167    Scheduled Meds: . acetaminophen  500 mg Oral Q6H  . amiodarone  200 mg Oral BID  . aspirin EC  81 mg Oral Daily  . atorvastatin  40 mg Oral BID  . calcitRIOL  0.5 mcg Oral Q M,W,F-HD  . Chlorhexidine Gluconate Cloth  6 each Topical Q0600  . feeding supplement (PRO-STAT SUGAR FREE 64)  30 mL Oral BID  . ferric citrate  420 mg Oral TID WC  . FLUoxetine  20 mg Oral Daily  . furosemide  40 mg Oral Once per day on Sun Tue Thu Sat  . gabapentin  300 mg Oral QHS  . heparin      . heparin injection (subcutaneous)  5,000 Units Subcutaneous Q12H  . insulin aspart  0-5 Units Subcutaneous QHS  . insulin aspart  0-9 Units Subcutaneous TID WC  . levalbuterol  0.63 mg Inhalation TID  . lidocaine-prilocaine  1 application Topical Q M,W,F-HD  . metoprolol tartrate  12.5 mg Oral BID  . multivitamin  1 tablet Oral QHS  . mupirocin ointment   Nasal BID   . nicotine  21 mg Transdermal Daily  . oxyCODONE  10 mg Oral Q12H  . pantoprazole  40 mg Oral Daily  . umeclidinium bromide  1 puff Inhalation Daily  . vancomycin  125 mg Oral Q6H   Continuous Infusions: . sodium chloride Stopped (06/16/18 2325)  . vancomycin 1,000 mg (06/15/18 1110)   PRN Meds:.heparin, hydrALAZINE, HYDROmorphone (DILAUDID) injection, ipratropium, nitroGLYCERIN, ondansetron **OR** ondansetron (ZOFRAN) IV, oxyCODONE, senna-docusate    [place] MWF  4h 350/1.5 78kg 2/2.25 bath P2 R AVF/ TDC still in Hep none Venofer 100 mg IV x 10 (until 3/23) Calcitriol 0.5 mcg PO TIW  Assessment/ Plan:   1. PAD/infected left fem-pop graft placed 05/04/18 s/p removal 06/14/18 with new SVG patch and now with wound vac 2. ESRD continue HD qMWF 3. Vascular access- ravf functioning well.  Can d/c tdc next week if cont to function well. 4. HTN/Volume- stable 5. 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 COPD- has home O2 8. Hypertension:stable 9. Chronic systolic chf- EF 76-19%  stable volume 10. Chronic afib- on amiodarone, beta-blocker and eliquis prior to admission. 11. Disposition- for discharge today after HD.   Donetta Potts,  MD 06/20/2018, 8:46 AM

## 2018-06-20 NOTE — Progress Notes (Signed)
[facility] PROGRESS NOTE    Progress Note  Jose Sellers  KTG:[id number] DOB: [birth year] DOA: 06/11/2018 PCP: Raina Mina., MD     Brief Narrative:   Jose Sellers is an 58 y.o. male past medical history of chronic atrial fibrillation not on anticoagulation, chronic systolic heart failure status post AICD, asthma, ischemic cardiomyopathy with an EF of 25%, COPD not oxygen dependent, diabetes mellitus type 2 peripheral artery disease end-stage renal disease who dialyzes Monday Wednesday and Friday comes into the ED complaining of left groin erythema and an open wound in the medial thigh, patient had a left femoral endarterectomy with patch angioplasty and left femoral above-the-knee popliteal bypass on 05/04/2018.  Assessment/Plan:   Principal Problem:   Wound infection Active Problems:   CAD (coronary artery disease)   Chronic systolic congestive heart failure (HCC)   COPD (chronic obstructive pulmonary disease) (HCC)   ESRD on dialysis (HCC)   GERD (gastroesophageal reflux disease)   Hypercholesterolemia   Essential hypertension   Tobacco use   Uncontrolled type 2 diabetes mellitus with diabetic polyneuropathy, with long-term current use of insulin (HCC)   AICD (automatic cardioverter/defibrillator) present   PAD (peripheral artery disease) (HCC)   Open wound of left thigh   Chronic atrial fibrillation   Cellulitis of left groin   Thrombocytopenia (HCC)   Left groin pain  Left groin wound status post removal of left femoral popliteal bypass: We will continue IV antibiotics for at least 2 weeks.  Wound culture grew her MRSA, blood cultures remain negative. The stop date is 06/24/2018. He will continue oral vancomycin 125 mg 4 times a day and will continue this 1 week after he completes his IV antibiotics, stop date for the oral antibiotics is 07/01/2018. You continue wound VAC at home and will follow up with home health as an outpatient. She is to get IV vancomycin  after dialysis on Monday Wednesdays and Fridays. Watch overnight to allow time to set up home health, wound VAC and nursing at home.  Peripheral vascular disease: He underwent extensive endarterectomy the left common femoral with dacryon patch with Gore-Tex and bypass on 05/04/2018.  Due to the concern of prostatic infection of the previous graft.  CAD: No changes made to his medication.  End-stage renal disease he is getting dialysis this morning he could be discharged after hemodialysis.  Chronic systolic heart failure: Continue current regimen no changes made.  Sustained VT status post ablation: No changes made to his medication continue amiodarone.  COPD: No changes made to his medication.   DVT prophylaxis: heparin Family Communication:none Disposition Plan/Barrier to D/C: home today Code Status:     Code Status Orders  (From admission, onward)         Start     Ordered   06/11/18 1933  Full code  Continuous     06/11/18 1933        Code Status History    Date Active Date Inactive Code Status Order ID Comments User Context   05/04/2018 1420 05/06/2018 1545 Full Code [id number]  Iline Oven Inpatient   04/20/2018 1329 04/20/2018 2018 Full Code [id number]  Elam Dutch, MD Inpatient    Advance Directive Documentation     Most Recent Value  Type of Advance Directive  Healthcare Power of Attorney, Living will  Pre-existing out of facility DNR order (yellow form or pink MOST form)  -  "MOST" Form in Place?  -        IV  Access:    Peripheral IV   Procedures and diagnostic studies:   No results found.   Medical Consultants:    None.  Anti-Infectives:   IV vanc  Subjective:    CEJAY CAMBRE no complaints feels great happy to be getting out of the hospital.  Objective:    Vitals:   06/20/18 0805 06/20/18 0810 06/20/18 0830 06/20/18 0900  BP: 132/66 (!) 116/58 110/60 111/60  Pulse: 68 68 68 66  Resp:      Temp:      TempSrc:       SpO2:      Weight:      Height:        Intake/Output Summary (Last 24 hours) at 06/20/2018 0936 Last data filed at 06/20/2018 0600 Gross per 24 hour  Intake 360 ml  Output 250 ml  Net 110 ml   Filed Weights   06/18/18 0730 06/18/18 1139 06/20/18 0804  Weight: 85.4 kg 81 kg 84.4 kg    Exam: General exam: In no acute distress. Respiratory system: Good air movement and clear to auscultation. Cardiovascular system: S1 & S2 heard, RRR.   Gastrointestinal system: Abdomen is nondistended, soft and nontender.  Central nervous system: Alert and oriented. No focal neurological deficits. Extremities: No pedal edema. Skin: No rashes, lesions or ulcers Psychiatry: Judgement and insight appear normal. Mood & affect appropriate.    Data Reviewed:    Labs: Basic Metabolic Panel: Recent Labs  Lab 06/13/18 1430 06/15/18 0657 06/18/18 0333 06/20/18 0830  NA 132* 130* 126* 127*  K 3.9 4.6 5.0 5.0  CL 92* 94* 87* 90*  CO2 26 24 27 26   GLUCOSE 113* 119* 106* 130*  BUN 41* 35* 52* 45*  CREATININE 5.90* 5.76* 6.16* 5.61*  CALCIUM 9.0 8.5* 8.9 8.8*  PHOS 3.7 3.4  --  2.6   GFR Estimated Creatinine Clearance: 16.1 mL/min (A) (by C-G formula based on SCr of 5.61 mg/dL (H)). Liver Function Tests: Recent Labs  Lab 06/13/18 1430 06/15/18 0657 06/20/18 0830  ALBUMIN 2.4* 2.3* 2.1*   No results for input(s): LIPASE, AMYLASE in the last 168 hours. No results for input(s): AMMONIA in the last 168 hours. Coagulation profile No results for input(s): INR, PROTIME in the last 168 hours.  CBC: Recent Labs  Lab 06/13/18 1440 06/15/18 0657 06/16/18 0214 06/18/18 0333 06/20/18 0830  WBC 7.7 10.3 8.8 7.1 8.5  NEUTROABS  --   --   --  5.4  --   HGB 13.4 8.9* 8.7* 9.1* 9.5*  HCT 44.1 29.4* 28.8* 29.5* 31.7*  MCV 81.8 84.0 83.5 82.6 83.6  PLT 92* 118* 112* 167 219   Cardiac Enzymes: No results for input(s): CKTOTAL, CKMB, CKMBINDEX, TROPONINI in the last 168 hours. BNP (last 3  results) No results for input(s): PROBNP in the last 8760 hours. CBG: Recent Labs  Lab 06/19/18 0624 06/19/18 1117 06/19/18 1611 06/19/18 2110 06/20/18 0634  GLUCAP 107* 101* 107* 128* 104*   D-Dimer: No results for input(s): DDIMER in the last 72 hours. Hgb A1c: No results for input(s): HGBA1C in the last 72 hours. Lipid Profile: No results for input(s): CHOL, HDL, LDLCALC, TRIG, CHOLHDL, LDLDIRECT in the last 72 hours. Thyroid function studies: No results for input(s): TSH, T4TOTAL, T3FREE, THYROIDAB in the last 72 hours.  Invalid input(s): FREET3 Anemia work up: No results for input(s): VITAMINB12, FOLATE, FERRITIN, TIBC, IRON, RETICCTPCT in the last 72 hours. Sepsis Labs: Recent Labs  Lab 06/15/18 [phone] 06/16/18 0214  06/18/18 0333 06/20/18 0830  WBC 10.3 8.8 7.1 8.5   Microbiology Recent Results (from the past 240 hour(s))  Blood culture (routine x 2)     Status: None   Collection Time: 06/11/18  4:52 PM  Result Value Ref Range Status   Specimen Description BLOOD LEFT HAND  Final   Special Requests   Final    BOTTLES DRAWN AEROBIC AND ANAEROBIC Blood Culture adequate volume   Culture   Final    NO GROWTH 5 DAYS Performed at [facility], [address], [place], [place] [address]    Report Status 06/16/2018 FINAL  Final  Blood culture (routine x 2)     Status: None   Collection Time: 06/11/18  5:01 PM  Result Value Ref Range Status   Specimen Description BLOOD LEFT WRIST  Final   Special Requests   Final    BOTTLES DRAWN AEROBIC AND ANAEROBIC Blood Culture results may not be optimal due to an inadequate volume of blood received in culture bottles   Culture   Final    NO GROWTH 5 DAYS Performed at [facility], [place] [address], [place], [place] [address]    Report Status 06/16/2018 FINAL  Final  MRSA PCR Screening     Status: Abnormal   Collection Time: 06/12/18  7:52 AM  Result Value Ref Range Status   MRSA by PCR POSITIVE (A) NEGATIVE Final     Comment:        The GeneXpert MRSA Assay (FDA approved for NASAL specimens only), is one component of a comprehensive MRSA colonization surveillance program. It is not intended to diagnose MRSA infection nor to guide or monitor treatment for MRSA infections. RESULT CALLED TO, READ BACK BY AND VERIFIED WITH: Lurline Hare RN 9:45 06/12/18 ([email or web address]) Performed at [facility], [place] [address] [place], [place] [address]   Aerobic/Anaerobic Culture (surgical/deep wound)     Status: None   Collection Time: 06/14/18 10:55 AM  Result Value Ref Range Status   Specimen Description WOUND LEFT GROIN  Final   Special Requests PATIENT ON FOLLOWING VANC  Final   Gram Stain   Final    FEW WBC PRESENT, PREDOMINANTLY PMN NO ORGANISMS SEEN    Culture   Final    FEW METHICILLIN RESISTANT STAPHYLOCOCCUS AUREUS NO ANAEROBES ISOLATED Performed at [facility], [place] [address] [place], [place] [address]    Report Status 06/19/2018 FINAL  Final   Organism ID, Bacteria METHICILLIN RESISTANT STAPHYLOCOCCUS AUREUS  Final      Susceptibility   Methicillin resistant staphylococcus aureus - MIC*    CIPROFLOXACIN >=8 RESISTANT Resistant     ERYTHROMYCIN >=8 RESISTANT Resistant     GENTAMICIN <=0.5 SENSITIVE Sensitive     OXACILLIN >=4 RESISTANT Resistant     TETRACYCLINE <=1 SENSITIVE Sensitive     VANCOMYCIN <=0.5 SENSITIVE Sensitive     TRIMETH/SULFA <=10 SENSITIVE Sensitive     CLINDAMYCIN <=0.25 SENSITIVE Sensitive     RIFAMPIN <=0.5 SENSITIVE Sensitive     Inducible Clindamycin NEGATIVE Sensitive     * FEW METHICILLIN RESISTANT STAPHYLOCOCCUS AUREUS     Medications:   . acetaminophen  500 mg Oral Q6H  . amiodarone  200 mg Oral BID  . aspirin EC  81 mg Oral Daily  . atorvastatin  40 mg Oral BID  . calcitRIOL  0.5 mcg Oral Q M,W,F-HD  . Chlorhexidine Gluconate Cloth  6 each Topical Q0600  . feeding supplement (  PRO-STAT SUGAR FREE 64)  30 mL Oral BID  . ferric citrate  420 mg Oral  TID WC  . FLUoxetine  20 mg Oral Daily  . furosemide  40 mg Oral Once per day on Sun Tue Thu Sat  . gabapentin  300 mg Oral QHS  . heparin      . heparin injection (subcutaneous)  5,000 Units Subcutaneous Q12H  . insulin aspart  0-5 Units Subcutaneous QHS  . insulin aspart  0-9 Units Subcutaneous TID WC  . levalbuterol  0.63 mg Inhalation TID  . lidocaine-prilocaine  1 application Topical Q M,W,F-HD  . metoprolol tartrate  12.5 mg Oral BID  . multivitamin  1 tablet Oral QHS  . mupirocin ointment   Nasal BID  . nicotine  21 mg Transdermal Daily  . oxyCODONE  10 mg Oral Q12H  . pantoprazole  40 mg Oral Daily  . umeclidinium bromide  1 puff Inhalation Daily  . vancomycin  125 mg Oral Q6H   Continuous Infusions: . sodium chloride Stopped (06/16/18 2325)  . vancomycin 1,000 mg (06/15/18 1110)      LOS: 8 days   Charlynne Cousins  [facility]  06/20/2018, 9:36 AM

## 2018-06-20 NOTE — Telephone Encounter (Signed)
The pharmacy called and requested the dose of Vancomycin on the prescription listed in the chart at discharge. ED CM read the dose from the prescription. The pharmacist states she will "do the math" to convert this prescription to capsules. She states they do not have the liquid and the physician called in capsules instead of the liquid.

## 2018-06-20 NOTE — Progress Notes (Signed)
Pharmacy Antibiotic Note  MIKAIL GOOSTREE is a 58 y.o. male with ESRD with scheduled HD MWF admitted on 06/11/2018 with wound infection.  Pharmacy has been consulted for IV vancomycin dosing. Patient also receiving PO vancomycin per primary for C.diff prophylaxis  while on antibiotics and for 1 week after. WBC WNL , afebrile, wound cx grew MRSA.   3/25 VT: 16 ug/mL  Plan:  Continue Vancomycin 1000mg  IV with HD on MWF F/u HD schedule, cxs, LOT/de-escalation, and levels prn   Height: 6' (182.9 cm) Weight: 186 lb 1.1 oz (84.4 kg) IBW/kg (Calculated) : 77.6  Temp (24hrs), Avg:97.8 F (36.6 C), Min:97.5 F (36.4 C), Max:98.3 F (36.8 C)  Recent Labs  Lab 06/13/18 1430 06/13/18 1440 06/15/18 0657 06/16/18 0214 06/18/18 0333 06/20/18 0830 06/20/18 0945  WBC  --  7.7 10.3 8.8 7.1 8.5  --   CREATININE 5.90*  --  5.76*  --  6.16* 5.61*  --   VANCOTROUGH  --   --   --   --   --   --  16    Estimated Creatinine Clearance: 16.1 mL/min (A) (by C-G formula based on SCr of 5.61 mg/dL (H)).    Allergies  Allergen Reactions  . Albuterol Palpitations    SVT  . Prednisone Shortness Of Breath    Wife stated that patient develops breathing issues.  . Fluticasone Furoate-Vilanterol Palpitations  . Morphine Rash    Antimicrobials this admission: PO Vanc 3/17>> IV Vanc 3/16>>(3/29)  Dose adjustments this admission: n/a  Microbiology results: 3/16 Bcx: ngtd 3/16 MRSA pcr + 3/19 Wound cx: MRSA  Thank you for allowing pharmacy to be a part of this patient's care.  Harrietta Guardian, PharmD PGY1 Pharmacy Resident 06/20/2018    10:33 AM Please check AMION for all [place] numbers

## 2018-06-20 NOTE — Progress Notes (Signed)
Discharge AVS meds take and those due reviewed with pt. Follow up appointments and when to call MD reviewed. All questions and concerns addressed. No further questions at this time. D/c IV and TELE, CCMD notified. D/C home per orders. Brought down via wheelchair with staff.

## 2018-06-20 NOTE — Consult Note (Signed)
[place] Nurse wound follow-up consult note Reason for Consult: Left popliteal and left groin surgical incisions for VAC therapy Wound type: Surgical Wound appearances are unchanged since previous assessment Drainage (amount, consistency, odor) no odor, no induration. Heavy serous drainage from both wounds Periwound: intact Dressing procedure/placement/frequency: MWF NPWT by [place] team.  One piece of black foam placed into each wound bed.  Wounds were bridged with an immediate seal obtained at 125 mmHg.  One barrier ring was used at the groin wound to enhance obtaining and maintaining a leak proof seal.  The patient tolerated the procedure very well. He plans to discharge with home health assistance for the negative pressure device dressings and machine. Julien Girt MSN, RN, [place], [place], [place]

## 2018-06-20 NOTE — Progress Notes (Signed)
PT Cancellation Note  Patient Details Name: Jose Sellers MRN: [id number] DOB: 1960/06/05   Cancelled Treatment:    Reason Eval/Treat Not Completed: Patient at procedure or test/unavailable At HD. Will follow-up as time allows.   Lanney Gins, PT, DPT Supplemental Physical Therapist 06/20/18 8:14 AM Pager: [phone] Office: [phone]

## 2018-06-20 NOTE — Progress Notes (Addendum)
[facility]  Subjective  - Doing better day by day.   Objective (!) 142/75 62 97.7 F (36.5 C) (Oral) 13 92%  Intake/Output Summary (Last 24 hours) at 06/20/2018 0719 Last data filed at 06/20/2018 0600 Gross per 24 hour  Intake 360 ml  Output 250 ml  Net 110 ml    Left LE warm to touch with DP doppler signal Active range of motion ans sensation intact L LE Wound vac in place, 250 cc SS drainage last 24 hours Lungs non labored breathing   Assessment/Planning: POD # 6 58 y.o. male is s/p removal of infected L fem-pop PTFE bypass   HD and wound vac change due today Plan home with home health Continue wound vac F/U with Dr. Donnetta Hutching 07/03/2018 scheduled  Roxy Horseman 06/20/2018 7:19 AM --  Laboratory Lab Results: Recent Labs    06/18/18 0333  WBC 7.1  HGB 9.1*  HCT 29.5*  PLT 167   BMET Recent Labs    06/18/18 0333  NA 126*  K 5.0  CL 87*  CO2 27  GLUCOSE 106*  BUN 52*  CREATININE 6.16*  CALCIUM 8.9    COAG Lab Results  Component Value Date   INR 1.23 05/01/2018   INR 1.08 02/13/2018   No results found for: PTT   I have examined the patient, reviewed and agree with above.  Curt Jews, MD 06/20/2018 11:45 AM

## 2018-06-21 LAB — GLUCOSE, CAPILLARY: Glucose-Capillary: 93 mg/dL (ref 70–99)

## 2018-06-22 ENCOUNTER — Telehealth: Payer: Self-pay | Admitting: Vascular Surgery

## 2018-06-22 ENCOUNTER — Ambulatory Visit (INDEPENDENT_AMBULATORY_CARE_PROVIDER_SITE_OTHER): Payer: Medicare Other | Admitting: Sports Medicine

## 2018-06-22 ENCOUNTER — Encounter: Payer: Self-pay | Admitting: Sports Medicine

## 2018-06-22 ENCOUNTER — Telehealth: Payer: Self-pay | Admitting: *Deleted

## 2018-06-22 ENCOUNTER — Other Ambulatory Visit: Payer: Self-pay

## 2018-06-22 VITALS — BP 87/57 | HR 70 | Temp 97.7°F | Resp 16

## 2018-06-22 DIAGNOSIS — L89620 Pressure ulcer of left heel, unstageable: Secondary | ICD-10-CM

## 2018-06-22 DIAGNOSIS — L97522 Non-pressure chronic ulcer of other part of left foot with fat layer exposed: Secondary | ICD-10-CM

## 2018-06-22 DIAGNOSIS — E1142 Type 2 diabetes mellitus with diabetic polyneuropathy: Secondary | ICD-10-CM | POA: Diagnosis not present

## 2018-06-22 DIAGNOSIS — N186 End stage renal disease: Secondary | ICD-10-CM

## 2018-06-22 DIAGNOSIS — I739 Peripheral vascular disease, unspecified: Secondary | ICD-10-CM

## 2018-06-22 MED ORDER — GABAPENTIN 300 MG PO CAPS: 300.0000 mg | ORAL_CAPSULE | Freq: Three times a day (TID) | ORAL | 3 refills | Status: AC

## 2018-06-22 NOTE — Telephone Encounter (Signed)
Jose Sellers. From [facility] called regarding orders to continue wound VAC to the Left groin wound.  Per hospital discharge notes from 06/20/2018, Pt is to continue wound care/VAC at home.  Levada Dy verbalized understanding.Thurston Hole., LPN.

## 2018-06-22 NOTE — Progress Notes (Signed)
Subjective: Jose Sellers is a 58 y.o. male patient with history of diabetes who presents to office today for evaluation of left great toe wound and is assisted by son who is assisting with his care today.  Patient reports that his wife is still doing dressings of Santyl to all areas on the left.  Patient was discharged from hospital on Tuesday after having an infected popliteal graft and had to have the graft replaced on his left lower extremity.  Patient denies nausea vomiting fever chills warmth or any purulence to the sites. Reports pain every now and again with the postop shoe patient is diabetic blood sugar this morning not recorded and reports that he has home nursing coming to the house to change the wound VAC at his graft site at the vascular procedure area at his groin.  Patient Active Problem List   Diagnosis Date Noted  . Left groin pain 06/12/2018  . Open wound of left thigh 06/11/2018  . Chronic atrial fibrillation 06/11/2018  . Cellulitis of left groin 06/11/2018  . Thrombocytopenia ([place]) 06/11/2018  . Wound infection 06/11/2018  . PAD (peripheral artery disease) ([place]) 05/04/2018  . AICD (automatic cardioverter/defibrillator) present   . Typical atrial flutter ([place]) 10/16/2017  . Acute respiratory failure with hypoxia ([place]) 09/08/2017  . C. difficile diarrhea 09/08/2017  . Peritonitis associated with peritoneal dialysis ([place]) 08/16/2017  . At high risk for falls 07/27/2017  . Arteriosclerotic vascular disease 03/16/2017  . Chronic heart disease 03/16/2017  . ESRD on dialysis ([place]) 03/16/2017  . Hypercholesterolemia 03/16/2017  . Hypertensive disorder 03/16/2017  . Anemia of unknown etiology 07/07/2016  . Malaise and fatigue 07/07/2016  . Chronic pain syndrome 06/20/2016  . Secondary hyperparathyroidism ([place]) 12/16/2015  . Stage 5 chronic kidney disease ([place]) 12/16/2015  . Chronic systolic congestive heart failure ([place]) 11/25/2015  . Tobacco use 11/25/2015  . Ventral  hernia without obstruction or gangrene 10/26/2015  . Psychosexual dysfunction with inhibited sexual excitement 08/27/2015  . Anxiety 08/26/2015  . COPD (chronic obstructive pulmonary disease) ([place]) 08/26/2015  . DDD (degenerative disc disease), lumbosacral 08/26/2015  . GERD (gastroesophageal reflux disease) 08/26/2015  . Hypothyroidism 08/26/2015  . Microalbuminuria 08/26/2015  . Mixed hyperlipidemia 08/26/2015  . Osteoarthritis of multiple joints 08/26/2015  . Peripheral vascular disease ([place]) 08/26/2015  . Abdominal hernia 06/16/2015  . CAD (coronary artery disease) 04/07/2015  . 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 OSA (obstructive sleep apnea) 04/07/2015  . Essential hypertension 03/06/2015  . Retinopathy, diabetic, bilateral ([place]) 03/06/2015  . Uncontrolled type 2 diabetes mellitus with diabetic polyneuropathy, with long-term current use of insulin ([place]) 03/06/2015  . Atherosclerosis with claudication of extremity ([place]) 08/01/2014  . Mild episode of recurrent major depressive disorder ([place]) 05/01/2013  . Panic disorder 05/01/2013   Current Outpatient Medications on File Prior to Visit  Medication Sig Dispense Refill  . amiodarone (PACERONE) 200 MG tablet Take 200 mg by mouth 2 (two) times daily.     Marland Kitchen aspirin 81 MG tablet Take 81 mg by mouth daily.     Marland Kitchen atorvastatin (LIPITOR) 40 MG tablet Take 40 mg by mouth 2 (two) times daily.     . B Complex-C-Folic Acid (DIALYVITE 564) 0.8 MG TABS Take 1 tablet by mouth daily.   4  . calcitRIOL (ROCALTROL) 0.25 MCG capsule Take 0.25 mcg by mouth every Monday, Wednesday, and Friday with hemodialysis.     Marland Kitchen esomeprazole (NEXIUM) 40 MG capsule Take 40 mg by mouth daily before breakfast.     .  ferric citrate (AURYXIA) 1 GM 210 MG(Fe) tablet Take 420 mg by mouth 3 (three) times daily with meals.     Marland Kitchen FLUoxetine (PROZAC) 20 MG capsule Take 20 mg by mouth daily.     . furosemide (LASIX) 40 MG tablet Take 40 mg by  mouth See admin instructions. Non dialysis days. Do not take on Monday, Wednesday and Friday    . Insulin Glargine (LANTUS SOLOSTAR) 100 UNIT/ML Solostar Pen Inject 8-12 Units into the skin at bedtime as needed (blood sugar).     . insulin lispro (HUMALOG KWIKPEN) 100 UNIT/ML KwikPen Inject 0-32 Units into the skin 3 (three) times daily. Sliding Scale Insulin     . levalbuterol (XOPENEX) 0.63 MG/3ML nebulizer solution Inhale 0.63 mg into the lungs 2 (two) times daily.   1  . lidocaine-prilocaine (EMLA) cream Apply 1 application topically See admin instructions. Apply small amount to access site 1-2 hours prior to dialysis.  12  . metoprolol tartrate (LOPRESSOR) 25 MG tablet Take 12.5 mg by mouth 2 (two) times daily.     . nitroGLYCERIN (NITROSTAT) 0.4 MG SL tablet Place 0.4 mg under the tongue every 5 (five) minutes as needed for chest pain.    Marland Kitchen oxycodone (ROXICODONE) 30 MG immediate release tablet Take 30 mg by mouth every 8 (eight) hours as needed for pain.     Marland Kitchen oxyCODONE-acetaminophen (PERCOCET) 7.5-325 MG tablet Take 1 tablet by mouth every 6 (six) hours as needed for severe pain. 25 tablet 0  . SANTYL ointment Apply 1 application topically daily. 15 g 1  . tiotropium (SPIRIVA) 18 MCG inhalation capsule Place 18 mcg into inhaler and inhale daily.     . vancomycin (VANCOCIN) 50 mg/mL oral solution Take 2.5 mLs (125 mg total) by mouth every 6 (six) hours for 12 days. 120 mL 0  . vancomycin 1,000 mg in sodium chloride 0.9 % 250 mL Inject 1,000 mg into the vein every Monday, Wednesday, and Friday with hemodialysis for 4 days.     No current facility-administered medications on file prior to visit.    Allergies  Allergen Reactions  . Albuterol Palpitations    SVT  . Prednisone Shortness Of Breath    Wife stated that patient develops breathing issues.  . Fluticasone Furoate-Vilanterol Palpitations  . Morphine Rash      Objective: General: Patient is awake, alert, and oriented x 3 and in  no acute distress.  Integument: Skin is warm, dry and supple bilateral.  Left hallux nailbed is clean dry and intact however to the distal tuft and medial aspect of the left great toe there is a fibrotic-escharotic ulceration that measures 2.5 x 1 cm once debrided reveal granular fatty tissue with mild swelling, blanchable erythema, no active drainage, no probe to bone, no other signs of infection.   To left heel there is a eschar that measures 2x2.5cm with no active drainage, no redness, no warmth, no signs of infection.   Vasculature:  Dorsalis Pedis pulse 1/4 bilateral. Posterior Tibial pulse  0/4 bilateral. Diminished from before. Capillary fill time <5 sec 1-5 bilateral.  Decreased hair growth to the level of the digits. Temperature gradient within normal limits. No varicosities present bilateral.   Neurology: The patient has diminished sensation measured with a 5.07/10g Semmes Weinstein Monofilament at all pedal sites bilateral . Vibratory sensation diminished bilateral with tuning fork. No Babinski sign present bilateral.   Musculoskeletal: Mild pain to palpation to left great toe and left heel. No tenderness with calf  compression bilateral.  Assessment and Plan: Problem List Items Addressed This Visit      Cardiovascular and Mediastinum   PAD (peripheral artery disease) ([place])    Other Visit Diagnoses    Toe ulcer, left, with fat layer exposed ([place])    -  Primary   Pressure ulcer of heel, left, unstageable ([place])       Diabetic polyneuropathy associated with type 2 diabetes mellitus ([place])       Relevant Medications   gabapentin (NEURONTIN) 300 MG capsule   End-stage renal disease ([place])         -Examined patient. -Discussed with patient treatment options for wounds on left -Mechanically debrided with a tissue nipper at left 1st toe to healthy bleeding margins hemostasis was achieved with manual pressure and then applied Medihoney advised patient and wife to continue with Santyl  and to dress daily with dry dressing -Mechanically debrided with a tissue nipper the loose skin along the posterior heel eschar on left. Applied Medihoney and guaze padding and advised patient to apply Santyl when get home  -[facility] to also assist with changing the area using Santyl 3 times per week; refill Santyl -Since postoperative shoe is hurting may try a soft bedroom shoe until we can order a special heel offloading shoe for patient -Advised patient to offload heel when in bed -Continue with close vascular follow-up -Advised patient to monitor for worsening symptoms if present to return to office or to go to ER immediately -Refill gabapentin -Patient to return in 2 weeks for continued wound care -Patient advised to call the office if any problems or questions arise in the meantime.  Landis Martins, DPM

## 2018-06-22 NOTE — Telephone Encounter (Deleted)
-----   Message from [place], [place] sent at 06/22/2018  2:01 PM EDT ----- Regarding: [facility] home wound care orders Apply Santyl and dry dressing to left great toe and left heel ulceration 3 times per week all covered with dry dressing.  Make sure patient is keeping pressure off his heel to avoid worsening of left heel ulcer.

## 2018-06-22 NOTE — Telephone Encounter (Signed)
-----   Message from [place], [place] sent at 06/22/2018  2:01 PM EDT ----- Regarding: [facility] home wound care orders Apply Santyl and dry dressing to left great toe and left heel ulceration 3 times per week all covered with dry dressing.  Make sure patient is keeping pressure off his heel to avoid worsening of left heel ulcer.

## 2018-06-22 NOTE — Telephone Encounter (Signed)
Faxed Dr. Leeanne Rio 06/22/2018 2:01pm orders to [facility].

## 2018-06-25 ENCOUNTER — Emergency Department (HOSPITAL_COMMUNITY): Payer: Medicare Other | Admitting: Anesthesiology

## 2018-06-25 ENCOUNTER — Inpatient Hospital Stay (HOSPITAL_COMMUNITY)
Admission: EM | Admit: 2018-06-25 | Discharge: 2018-07-27 | DRG: 907 | Disposition: E | Payer: Medicare Other | Attending: Vascular Surgery | Admitting: Vascular Surgery

## 2018-06-25 ENCOUNTER — Encounter (HOSPITAL_COMMUNITY): Admission: EM | Disposition: E | Payer: Self-pay | Source: Home / Self Care | Attending: Vascular Surgery

## 2018-06-25 ENCOUNTER — Telehealth: Payer: Self-pay | Admitting: Vascular Surgery

## 2018-06-25 ENCOUNTER — Encounter (HOSPITAL_COMMUNITY): Payer: Self-pay | Admitting: *Deleted

## 2018-06-25 ENCOUNTER — Other Ambulatory Visit: Payer: Self-pay

## 2018-06-25 DIAGNOSIS — Z515 Encounter for palliative care: Secondary | ICD-10-CM | POA: Diagnosis not present

## 2018-06-25 DIAGNOSIS — I2510 Atherosclerotic heart disease of native coronary artery without angina pectoris: Secondary | ICD-10-CM | POA: Diagnosis present

## 2018-06-25 DIAGNOSIS — K219 Gastro-esophageal reflux disease without esophagitis: Secondary | ICD-10-CM | POA: Diagnosis present

## 2018-06-25 DIAGNOSIS — R6521 Severe sepsis with septic shock: Secondary | ICD-10-CM | POA: Diagnosis not present

## 2018-06-25 DIAGNOSIS — E1140 Type 2 diabetes mellitus with diabetic neuropathy, unspecified: Secondary | ICD-10-CM | POA: Diagnosis present

## 2018-06-25 DIAGNOSIS — L97429 Non-pressure chronic ulcer of left heel and midfoot with unspecified severity: Secondary | ICD-10-CM | POA: Diagnosis present

## 2018-06-25 DIAGNOSIS — E1152 Type 2 diabetes mellitus with diabetic peripheral angiopathy with gangrene: Secondary | ICD-10-CM | POA: Diagnosis present

## 2018-06-25 DIAGNOSIS — I97618 Postprocedural hemorrhage and hematoma of a circulatory system organ or structure following other circulatory system procedure: Principal | ICD-10-CM | POA: Diagnosis present

## 2018-06-25 DIAGNOSIS — J9601 Acute respiratory failure with hypoxia: Secondary | ICD-10-CM | POA: Diagnosis not present

## 2018-06-25 DIAGNOSIS — I252 Old myocardial infarction: Secondary | ICD-10-CM

## 2018-06-25 DIAGNOSIS — R0902 Hypoxemia: Secondary | ICD-10-CM

## 2018-06-25 DIAGNOSIS — A419 Sepsis, unspecified organism: Secondary | ICD-10-CM | POA: Diagnosis not present

## 2018-06-25 DIAGNOSIS — N2581 Secondary hyperparathyroidism of renal origin: Secondary | ICD-10-CM | POA: Diagnosis present

## 2018-06-25 DIAGNOSIS — Y713 Surgical instruments, materials and cardiovascular devices (including sutures) associated with adverse incidents: Secondary | ICD-10-CM | POA: Diagnosis present

## 2018-06-25 DIAGNOSIS — D631 Anemia in chronic kidney disease: Secondary | ICD-10-CM | POA: Diagnosis present

## 2018-06-25 DIAGNOSIS — I482 Chronic atrial fibrillation, unspecified: Secondary | ICD-10-CM | POA: Diagnosis present

## 2018-06-25 DIAGNOSIS — I5022 Chronic systolic (congestive) heart failure: Secondary | ICD-10-CM | POA: Diagnosis present

## 2018-06-25 DIAGNOSIS — L97529 Non-pressure chronic ulcer of other part of left foot with unspecified severity: Secondary | ICD-10-CM | POA: Diagnosis present

## 2018-06-25 DIAGNOSIS — I132 Hypertensive heart and chronic kidney disease with heart failure and with stage 5 chronic kidney disease, or end stage renal disease: Secondary | ICD-10-CM | POA: Diagnosis present

## 2018-06-25 DIAGNOSIS — Z7189 Other specified counseling: Secondary | ICD-10-CM

## 2018-06-25 DIAGNOSIS — Z95810 Presence of automatic (implantable) cardiac defibrillator: Secondary | ICD-10-CM

## 2018-06-25 DIAGNOSIS — E11621 Type 2 diabetes mellitus with foot ulcer: Secondary | ICD-10-CM | POA: Diagnosis present

## 2018-06-25 DIAGNOSIS — R0603 Acute respiratory distress: Secondary | ICD-10-CM

## 2018-06-25 DIAGNOSIS — Z66 Do not resuscitate: Secondary | ICD-10-CM | POA: Diagnosis not present

## 2018-06-25 DIAGNOSIS — L8990 Pressure ulcer of unspecified site, unspecified stage: Secondary | ICD-10-CM

## 2018-06-25 DIAGNOSIS — T17908A Unspecified foreign body in respiratory tract, part unspecified causing other injury, initial encounter: Secondary | ICD-10-CM

## 2018-06-25 DIAGNOSIS — N179 Acute kidney failure, unspecified: Secondary | ICD-10-CM | POA: Diagnosis not present

## 2018-06-25 DIAGNOSIS — E875 Hyperkalemia: Secondary | ICD-10-CM | POA: Diagnosis not present

## 2018-06-25 DIAGNOSIS — J9690 Respiratory failure, unspecified, unspecified whether with hypoxia or hypercapnia: Secondary | ICD-10-CM

## 2018-06-25 DIAGNOSIS — T82838A Hemorrhage of vascular prosthetic devices, implants and grafts, initial encounter: Secondary | ICD-10-CM | POA: Diagnosis present

## 2018-06-25 DIAGNOSIS — T8130XA Disruption of wound, unspecified, initial encounter: Secondary | ICD-10-CM | POA: Diagnosis present

## 2018-06-25 DIAGNOSIS — I472 Ventricular tachycardia: Secondary | ICD-10-CM | POA: Diagnosis not present

## 2018-06-25 DIAGNOSIS — Z8614 Personal history of Methicillin resistant Staphylococcus aureus infection: Secondary | ICD-10-CM

## 2018-06-25 DIAGNOSIS — Z7951 Long term (current) use of inhaled steroids: Secondary | ICD-10-CM

## 2018-06-25 DIAGNOSIS — N186 End stage renal disease: Secondary | ICD-10-CM | POA: Diagnosis present

## 2018-06-25 DIAGNOSIS — J9600 Acute respiratory failure, unspecified whether with hypoxia or hypercapnia: Secondary | ICD-10-CM | POA: Diagnosis not present

## 2018-06-25 DIAGNOSIS — D62 Acute posthemorrhagic anemia: Secondary | ICD-10-CM | POA: Diagnosis not present

## 2018-06-25 DIAGNOSIS — E785 Hyperlipidemia, unspecified: Secondary | ICD-10-CM | POA: Diagnosis present

## 2018-06-25 DIAGNOSIS — Z681 Body mass index (BMI) 19 or less, adult: Secondary | ICD-10-CM | POA: Diagnosis not present

## 2018-06-25 DIAGNOSIS — Z992 Dependence on renal dialysis: Secondary | ICD-10-CM | POA: Diagnosis not present

## 2018-06-25 DIAGNOSIS — Z01818 Encounter for other preprocedural examination: Secondary | ICD-10-CM

## 2018-06-25 DIAGNOSIS — E1122 Type 2 diabetes mellitus with diabetic chronic kidney disease: Secondary | ICD-10-CM | POA: Diagnosis present

## 2018-06-25 DIAGNOSIS — E7800 Pure hypercholesterolemia, unspecified: Secondary | ICD-10-CM | POA: Diagnosis present

## 2018-06-25 DIAGNOSIS — Y832 Surgical operation with anastomosis, bypass or graft as the cause of abnormal reaction of the patient, or of later complication, without mention of misadventure at the time of the procedure: Secondary | ICD-10-CM | POA: Diagnosis present

## 2018-06-25 DIAGNOSIS — J690 Pneumonitis due to inhalation of food and vomit: Secondary | ICD-10-CM | POA: Diagnosis not present

## 2018-06-25 DIAGNOSIS — J449 Chronic obstructive pulmonary disease, unspecified: Secondary | ICD-10-CM | POA: Diagnosis present

## 2018-06-25 DIAGNOSIS — E039 Hypothyroidism, unspecified: Secondary | ICD-10-CM | POA: Diagnosis present

## 2018-06-25 DIAGNOSIS — M898X9 Other specified disorders of bone, unspecified site: Secondary | ICD-10-CM | POA: Diagnosis present

## 2018-06-25 DIAGNOSIS — F341 Dysthymic disorder: Secondary | ICD-10-CM | POA: Diagnosis present

## 2018-06-25 DIAGNOSIS — Z9115 Patient's noncompliance with renal dialysis: Secondary | ICD-10-CM

## 2018-06-25 DIAGNOSIS — B961 Klebsiella pneumoniae [K. pneumoniae] as the cause of diseases classified elsewhere: Secondary | ICD-10-CM | POA: Diagnosis present

## 2018-06-25 DIAGNOSIS — Z7982 Long term (current) use of aspirin: Secondary | ICD-10-CM

## 2018-06-25 DIAGNOSIS — F17210 Nicotine dependence, cigarettes, uncomplicated: Secondary | ICD-10-CM | POA: Diagnosis present

## 2018-06-25 DIAGNOSIS — E43 Unspecified severe protein-calorie malnutrition: Secondary | ICD-10-CM | POA: Diagnosis present

## 2018-06-25 DIAGNOSIS — Z794 Long term (current) use of insulin: Secondary | ICD-10-CM

## 2018-06-25 DIAGNOSIS — E782 Mixed hyperlipidemia: Secondary | ICD-10-CM | POA: Diagnosis present

## 2018-06-25 DIAGNOSIS — G894 Chronic pain syndrome: Secondary | ICD-10-CM | POA: Diagnosis present

## 2018-06-25 DIAGNOSIS — E11319 Type 2 diabetes mellitus with unspecified diabetic retinopathy without macular edema: Secondary | ICD-10-CM | POA: Diagnosis present

## 2018-06-25 DIAGNOSIS — T148XXA Other injury of unspecified body region, initial encounter: Secondary | ICD-10-CM

## 2018-06-25 DIAGNOSIS — R58 Hemorrhage, not elsewhere classified: Secondary | ICD-10-CM | POA: Diagnosis present

## 2018-06-25 DIAGNOSIS — Z888 Allergy status to other drugs, medicaments and biological substances status: Secondary | ICD-10-CM

## 2018-06-25 DIAGNOSIS — D696 Thrombocytopenia, unspecified: Secondary | ICD-10-CM | POA: Diagnosis present

## 2018-06-25 DIAGNOSIS — G4733 Obstructive sleep apnea (adult) (pediatric): Secondary | ICD-10-CM | POA: Diagnosis present

## 2018-06-25 DIAGNOSIS — Z885 Allergy status to narcotic agent status: Secondary | ICD-10-CM

## 2018-06-25 DIAGNOSIS — Z9111 Patient's noncompliance with dietary regimen: Secondary | ICD-10-CM

## 2018-06-25 HISTORY — PX: PATCH ANGIOPLASTY: SHX6230

## 2018-06-25 HISTORY — PX: FEMORAL ARTERY EXPLORATION: SHX5160

## 2018-06-25 LAB — PROTIME-INR
INR: 1.5 — ABNORMAL HIGH (ref 0.8–1.2)
PROTHROMBIN TIME: 17.9 s — AB (ref 11.4–15.2)

## 2018-06-25 LAB — BASIC METABOLIC PANEL
Anion gap: 8 (ref 5–15)
BUN: 18 mg/dL (ref 6–20)
CO2: 29 mmol/L (ref 22–32)
Calcium: 7.9 mg/dL — ABNORMAL LOW (ref 8.9–10.3)
Chloride: 97 mmol/L — ABNORMAL LOW (ref 98–111)
Creatinine, Ser: 3.35 mg/dL — ABNORMAL HIGH (ref 0.61–1.24)
GFR calc Af Amer: 23 mL/min — ABNORMAL LOW (ref >60)
GFR calc non Af Amer: 19 mL/min — ABNORMAL LOW (ref >60)
Glucose, Bld: 136 mg/dL — ABNORMAL HIGH (ref 70–99)
Potassium: 4 mmol/L (ref 3.5–5.1)
Sodium: 134 mmol/L — ABNORMAL LOW (ref 135–145)

## 2018-06-25 LAB — CBC
HCT: 28 % — ABNORMAL LOW (ref 39.0–52.0)
Hemoglobin: 8.2 g/dL — ABNORMAL LOW (ref 13.0–17.0)
MCH: 24.6 pg — AB (ref 26.0–34.0)
MCHC: 29.3 g/dL — AB (ref 30.0–36.0)
MCV: 83.8 fL (ref 80.0–100.0)
Platelets: 284 10*3/uL (ref 150–400)
RBC: 3.34 MIL/uL — ABNORMAL LOW (ref 4.22–5.81)
RDW: 17.7 % — AB (ref 11.5–15.5)
WBC: 11.5 10*3/uL — ABNORMAL HIGH (ref 4.0–10.5)
nRBC: 0 % (ref 0.0–0.2)

## 2018-06-25 LAB — GLUCOSE, CAPILLARY: Glucose-Capillary: 114 mg/dL — ABNORMAL HIGH (ref 70–99)

## 2018-06-25 LAB — PREPARE RBC (CROSSMATCH)

## 2018-06-25 SURGERY — EXPLORATION, ARTERY, FEMORAL
Anesthesia: General | Site: Groin | Laterality: Left

## 2018-06-25 MED ORDER — NITROGLYCERIN 0.4 MG SL SUBL: 0.4000 mg | SUBLINGUAL_TABLET | SUBLINGUAL | Status: DC | PRN

## 2018-06-25 MED ORDER — VANCOMYCIN HCL 1000 MG IV SOLR
1000.0000 mg | INTRAVENOUS | Status: DC
  Filled 2018-06-25: qty 1000

## 2018-06-25 MED ORDER — HYDRALAZINE HCL 20 MG/ML IJ SOLN: 5.0000 mg | INTRAMUSCULAR | Status: DC | PRN

## 2018-06-25 MED ORDER — PROTAMINE SULFATE 10 MG/ML IV SOLN
INTRAVENOUS | Status: DC | PRN
  Administered 2018-06-25: 50 mg via INTRAVENOUS

## 2018-06-25 MED ORDER — SODIUM CHLORIDE 0.9 % IV SOLN
INTRAVENOUS | Status: AC
  Filled 2018-06-25: qty 1.2

## 2018-06-25 MED ORDER — RENA-VITE PO TABS
1.0000 | ORAL_TABLET | Freq: Every day | ORAL | Status: DC
  Administered 2018-06-26 – 2018-07-10 (×14): 1 via ORAL
  Filled 2018-06-25 (×14): qty 1

## 2018-06-25 MED ORDER — HEPARIN SODIUM (PORCINE) 1000 UNIT/ML IJ SOLN
INTRAMUSCULAR | Status: DC | PRN
  Administered 2018-06-25 (×2): 5000 [IU] via INTRAVENOUS

## 2018-06-25 MED ORDER — DIALYVITE 800 0.8 MG PO TABS: 1.0000 | ORAL_TABLET | Freq: Every day | ORAL | Status: DC

## 2018-06-25 MED ORDER — SUCCINYLCHOLINE CHLORIDE 20 MG/ML IJ SOLN: INTRAMUSCULAR | Status: DC | PRN

## 2018-06-25 MED ORDER — VANCOMYCIN 50 MG/ML ORAL SOLUTION
125.0000 mg | Freq: Four times a day (QID) | ORAL | Status: AC
  Administered 2018-06-26 – 2018-07-01 (×23): 125 mg via ORAL
  Filled 2018-06-25 (×24): qty 2.5

## 2018-06-25 MED ORDER — PROTAMINE SULFATE 10 MG/ML IV SOLN
INTRAVENOUS | Status: AC
  Filled 2018-06-25: qty 5

## 2018-06-25 MED ORDER — INSULIN ASPART 100 UNIT/ML ~~LOC~~ SOLN
0.0000 [IU] | Freq: Three times a day (TID) | SUBCUTANEOUS | Status: DC
  Administered 2018-06-26: 1 [IU] via SUBCUTANEOUS
  Administered 2018-06-27: 2 [IU] via SUBCUTANEOUS
  Administered 2018-06-27 – 2018-07-05 (×4): 1 [IU] via SUBCUTANEOUS
  Administered 2018-07-07: 2 [IU] via SUBCUTANEOUS

## 2018-06-25 MED ORDER — DOCUSATE SODIUM 100 MG PO CAPS
100.0000 mg | ORAL_CAPSULE | Freq: Every day | ORAL | Status: DC
  Administered 2018-06-26 – 2018-07-06 (×11): 100 mg via ORAL
  Filled 2018-06-25 (×12): qty 1

## 2018-06-25 MED ORDER — POLYETHYLENE GLYCOL 3350 17 G PO PACK
17.0000 g | PACK | Freq: Every day | ORAL | Status: DC | PRN
  Administered 2018-06-28: 17 g via ORAL
  Filled 2018-06-25: qty 1

## 2018-06-25 MED ORDER — ACETAMINOPHEN 325 MG RE SUPP: 325.0000 mg | RECTAL | Status: DC | PRN

## 2018-06-25 MED ORDER — AMIODARONE HCL 200 MG PO TABS
200.0000 mg | ORAL_TABLET | Freq: Two times a day (BID) | ORAL | Status: DC
  Administered 2018-06-26 – 2018-07-06 (×23): 200 mg via ORAL
  Filled 2018-06-25 (×24): qty 1

## 2018-06-25 MED ORDER — CALCITRIOL 0.25 MCG PO CAPS: 0.2500 ug | ORAL_CAPSULE | ORAL | Status: DC

## 2018-06-25 MED ORDER — EPHEDRINE 5 MG/ML INJ
INTRAVENOUS | Status: AC
  Filled 2018-06-25: qty 10

## 2018-06-25 MED ORDER — MAGNESIUM SULFATE 2 GM/50ML IV SOLN
2.0000 g | Freq: Every day | INTRAVENOUS | Status: DC | PRN
  Filled 2018-06-25: qty 50

## 2018-06-25 MED ORDER — HYDROMORPHONE HCL 1 MG/ML IJ SOLN: 0.2500 mg | INTRAMUSCULAR | Status: DC | PRN

## 2018-06-25 MED ORDER — CEFAZOLIN SODIUM-DEXTROSE 2-4 GM/100ML-% IV SOLN
2.0000 g | Freq: Once | INTRAVENOUS | Status: DC
  Filled 2018-06-25: qty 100

## 2018-06-25 MED ORDER — FLUOXETINE HCL 20 MG PO CAPS
20.0000 mg | ORAL_CAPSULE | Freq: Every day | ORAL | Status: DC
  Administered 2018-06-26 – 2018-07-06 (×11): 20 mg via ORAL
  Filled 2018-06-25 (×11): qty 1

## 2018-06-25 MED ORDER — ASPIRIN EC 81 MG PO TBEC
81.0000 mg | DELAYED_RELEASE_TABLET | Freq: Every day | ORAL | Status: DC
  Administered 2018-06-26 – 2018-07-10 (×12): 81 mg via ORAL
  Filled 2018-06-25 (×13): qty 1

## 2018-06-25 MED ORDER — SUCCINYLCHOLINE CHLORIDE 200 MG/10ML IV SOSY
PREFILLED_SYRINGE | INTRAVENOUS | Status: AC
  Filled 2018-06-25: qty 10

## 2018-06-25 MED ORDER — PHENYLEPHRINE 40 MCG/ML (10ML) SYRINGE FOR IV PUSH (FOR BLOOD PRESSURE SUPPORT)
PREFILLED_SYRINGE | INTRAVENOUS | Status: AC
  Filled 2018-06-25: qty 10

## 2018-06-25 MED ORDER — METOPROLOL TARTRATE 12.5 MG HALF TABLET
12.5000 mg | ORAL_TABLET | Freq: Two times a day (BID) | ORAL | Status: DC
  Administered 2018-06-26 – 2018-07-06 (×21): 12.5 mg via ORAL
  Filled 2018-06-25 (×21): qty 1

## 2018-06-25 MED ORDER — SODIUM CHLORIDE 0.9 % IV BOLUS
250.0000 mL | Freq: Once | INTRAVENOUS | Status: AC
  Administered 2018-06-25: 250 mL via INTRAVENOUS

## 2018-06-25 MED ORDER — GABAPENTIN 300 MG PO CAPS
300.0000 mg | ORAL_CAPSULE | Freq: Three times a day (TID) | ORAL | Status: DC
  Administered 2018-06-26 (×2): 300 mg via ORAL
  Filled 2018-06-25 (×2): qty 1

## 2018-06-25 MED ORDER — FERRIC CITRATE 1 GM 210 MG(FE) PO TABS
420.0000 mg | ORAL_TABLET | Freq: Three times a day (TID) | ORAL | Status: DC
  Administered 2018-06-26 – 2018-07-10 (×28): 420 mg via ORAL
  Filled 2018-06-25 (×49): qty 2

## 2018-06-25 MED ORDER — INSULIN GLARGINE 100 UNIT/ML ~~LOC~~ SOLN
8.0000 [IU] | Freq: Every evening | SUBCUTANEOUS | Status: DC | PRN
  Filled 2018-06-25: qty 0.08

## 2018-06-25 MED ORDER — PHENYLEPHRINE 40 MCG/ML (10ML) SYRINGE FOR IV PUSH (FOR BLOOD PRESSURE SUPPORT)
PREFILLED_SYRINGE | INTRAVENOUS | Status: DC | PRN
  Administered 2018-06-25 (×2): 120 ug via INTRAVENOUS
  Administered 2018-06-25 (×2): 80 ug via INTRAVENOUS

## 2018-06-25 MED ORDER — ALBUMIN HUMAN 5 % IV SOLN
12.5000 g | Freq: Once | INTRAVENOUS | Status: AC
  Administered 2018-06-25: 12.5 g via INTRAVENOUS

## 2018-06-25 MED ORDER — LABETALOL HCL 5 MG/ML IV SOLN: 10.0000 mg | INTRAVENOUS | Status: DC | PRN

## 2018-06-25 MED ORDER — FENTANYL CITRATE (PF) 250 MCG/5ML IJ SOLN
INTRAMUSCULAR | Status: DC | PRN
  Administered 2018-06-25 (×5): 50 ug via INTRAVENOUS

## 2018-06-25 MED ORDER — HEPARIN SODIUM (PORCINE) 1000 UNIT/ML IJ SOLN
INTRAMUSCULAR | Status: AC
  Filled 2018-06-25: qty 1

## 2018-06-25 MED ORDER — SODIUM CHLORIDE 0.9 % IV SOLN
INTRAVENOUS | Status: DC | PRN
  Administered 2018-06-25: 16:00:00 via INTRAVENOUS

## 2018-06-25 MED ORDER — SODIUM CHLORIDE 0.9% IV SOLUTION: Freq: Once | INTRAVENOUS | Status: DC

## 2018-06-25 MED ORDER — ONDANSETRON HCL 4 MG/2ML IJ SOLN
INTRAMUSCULAR | Status: DC | PRN
  Administered 2018-06-25: 4 mg via INTRAVENOUS

## 2018-06-25 MED ORDER — SUCCINYLCHOLINE CHLORIDE 200 MG/10ML IV SOSY: PREFILLED_SYRINGE | INTRAVENOUS | Status: DC | PRN

## 2018-06-25 MED ORDER — EPHEDRINE SULFATE-NACL 50-0.9 MG/10ML-% IV SOSY
PREFILLED_SYRINGE | INTRAVENOUS | Status: DC | PRN
  Administered 2018-06-25 (×4): 10 mg via INTRAVENOUS

## 2018-06-25 MED ORDER — PHENOL 1.4 % MT LIQD: 1.0000 | OROMUCOSAL | Status: DC | PRN

## 2018-06-25 MED ORDER — ACETAMINOPHEN 325 MG PO TABS: 325.0000 mg | ORAL_TABLET | ORAL | Status: DC | PRN

## 2018-06-25 MED ORDER — ALBUMIN HUMAN 5 % IV SOLN
INTRAVENOUS | Status: AC
  Filled 2018-06-25: qty 250

## 2018-06-25 MED ORDER — LIDOCAINE-PRILOCAINE 2.5-2.5 % EX CREA: 1.0000 "application " | TOPICAL_CREAM | CUTANEOUS | Status: DC

## 2018-06-25 MED ORDER — BISACODYL 10 MG RE SUPP: 10.0000 mg | Freq: Every day | RECTAL | Status: DC | PRN

## 2018-06-25 MED ORDER — SODIUM CHLORIDE 0.9 % IV SOLN
INTRAVENOUS | Status: DC | PRN
  Administered 2018-06-25: 16:00:00

## 2018-06-25 MED ORDER — 0.9 % SODIUM CHLORIDE (POUR BTL) OPTIME
TOPICAL | Status: DC | PRN
  Administered 2018-06-25: 2000 mL
  Administered 2018-06-25 (×2): 1000 mL

## 2018-06-25 MED ORDER — CEFAZOLIN SODIUM-DEXTROSE 1-4 GM/50ML-% IV SOLN
1.0000 g | INTRAVENOUS | Status: AC
  Administered 2018-06-25: 1 g via INTRAVENOUS
  Filled 2018-06-25: qty 50

## 2018-06-25 MED ORDER — ONDANSETRON HCL 4 MG/2ML IJ SOLN
4.0000 mg | Freq: Four times a day (QID) | INTRAMUSCULAR | Status: DC | PRN
  Administered 2018-07-01: 4 mg via INTRAVENOUS
  Filled 2018-06-25: qty 2

## 2018-06-25 MED ORDER — HYDROMORPHONE HCL 1 MG/ML IJ SOLN
0.5000 mg | INTRAMUSCULAR | Status: DC | PRN
  Administered 2018-06-27 – 2018-07-02 (×5): 0.5 mg via INTRAVENOUS
  Filled 2018-06-25 (×5): qty 1

## 2018-06-25 MED ORDER — ETOMIDATE 2 MG/ML IV SOLN
INTRAVENOUS | Status: DC | PRN
  Administered 2018-06-25: 16 mg via INTRAVENOUS

## 2018-06-25 MED ORDER — SODIUM CHLORIDE 0.9 % IV SOLN
INTRAVENOUS | Status: DC | PRN
  Administered 2018-06-25: 30 ug/min via INTRAVENOUS
  Administered 2018-06-25: 18:00:00 via INTRAVENOUS

## 2018-06-25 MED ORDER — TIOTROPIUM BROMIDE MONOHYDRATE 18 MCG IN CAPS: 18.0000 ug | ORAL_CAPSULE | Freq: Every day | RESPIRATORY_TRACT | Status: DC

## 2018-06-25 MED ORDER — SODIUM CHLORIDE 0.9 % IV SOLN
500.0000 mL | Freq: Once | INTRAVENOUS | Status: AC | PRN
  Administered 2018-06-25: 250 mL via INTRAVENOUS

## 2018-06-25 MED ORDER — OXYCODONE HCL 5 MG PO TABS
30.0000 mg | ORAL_TABLET | Freq: Three times a day (TID) | ORAL | Status: DC | PRN
  Administered 2018-06-27 – 2018-07-06 (×17): 30 mg via ORAL
  Filled 2018-06-25 (×18): qty 6

## 2018-06-25 MED ORDER — FENTANYL CITRATE (PF) 250 MCG/5ML IJ SOLN
INTRAMUSCULAR | Status: AC
  Filled 2018-06-25: qty 5

## 2018-06-25 MED ORDER — ATORVASTATIN CALCIUM 40 MG PO TABS
40.0000 mg | ORAL_TABLET | Freq: Two times a day (BID) | ORAL | Status: DC
  Administered 2018-06-26 – 2018-07-06 (×22): 40 mg via ORAL
  Filled 2018-06-25 (×23): qty 1

## 2018-06-25 MED ORDER — FUROSEMIDE 40 MG PO TABS
40.0000 mg | ORAL_TABLET | ORAL | Status: DC
  Administered 2018-06-26 – 2018-06-28 (×2): 40 mg via ORAL
  Filled 2018-06-25 (×2): qty 1

## 2018-06-25 MED ORDER — SUCCINYLCHOLINE CHLORIDE 200 MG/10ML IV SOSY
PREFILLED_SYRINGE | INTRAVENOUS | Status: DC | PRN
  Administered 2018-06-25: 100 mg via INTRAVENOUS

## 2018-06-25 MED ORDER — ETOMIDATE 2 MG/ML IV SOLN
INTRAVENOUS | Status: AC
  Filled 2018-06-25: qty 10

## 2018-06-25 MED ORDER — LIDOCAINE 2% (20 MG/ML) 5 ML SYRINGE
INTRAMUSCULAR | Status: AC
  Filled 2018-06-25: qty 5

## 2018-06-25 MED ORDER — ONDANSETRON HCL 4 MG/2ML IJ SOLN
INTRAMUSCULAR | Status: AC
  Filled 2018-06-25: qty 2

## 2018-06-25 MED ORDER — ALBUMIN HUMAN 5 % IV SOLN
INTRAVENOUS | Status: DC | PRN
  Administered 2018-06-25 (×2): via INTRAVENOUS

## 2018-06-25 MED ORDER — PANTOPRAZOLE SODIUM 40 MG PO TBEC
40.0000 mg | DELAYED_RELEASE_TABLET | Freq: Every day | ORAL | Status: DC
  Administered 2018-06-26 – 2018-07-06 (×11): 40 mg via ORAL
  Filled 2018-06-25 (×11): qty 1

## 2018-06-25 MED ORDER — GUAIFENESIN-DM 100-10 MG/5ML PO SYRP: 15.0000 mL | ORAL_SOLUTION | ORAL | Status: DC | PRN

## 2018-06-25 MED ORDER — VANCOMYCIN HCL 10 G IV SOLR
1500.0000 mg | Freq: Once | INTRAVENOUS | Status: AC
  Administered 2018-06-25: 1500 mg via INTRAVENOUS
  Filled 2018-06-25: qty 1500

## 2018-06-25 MED ORDER — OXYCODONE-ACETAMINOPHEN 7.5-325 MG PO TABS
1.0000 | ORAL_TABLET | Freq: Four times a day (QID) | ORAL | Status: DC | PRN
  Administered 2018-06-26 – 2018-07-05 (×15): 1 via ORAL
  Filled 2018-06-25 (×16): qty 1

## 2018-06-25 MED ORDER — PROMETHAZINE HCL 25 MG/ML IJ SOLN: 6.2500 mg | INTRAMUSCULAR | Status: DC | PRN

## 2018-06-25 MED ORDER — LEVALBUTEROL HCL 0.63 MG/3ML IN NEBU
0.6300 mg | INHALATION_SOLUTION | Freq: Two times a day (BID) | RESPIRATORY_TRACT | Status: DC
  Administered 2018-06-26 – 2018-07-12 (×26): 0.63 mg via RESPIRATORY_TRACT
  Filled 2018-06-25 (×32): qty 3

## 2018-06-25 MED ORDER — LIDOCAINE 2% (20 MG/ML) 5 ML SYRINGE
INTRAMUSCULAR | Status: DC | PRN
  Administered 2018-06-25: 60 mg via INTRAVENOUS

## 2018-06-25 MED ORDER — METOPROLOL TARTRATE 5 MG/5ML IV SOLN: 2.0000 mg | INTRAVENOUS | Status: DC | PRN

## 2018-06-25 SURGICAL SUPPLY — 58 items
BANDAGE ESMARK 6X9 LF (GAUZE/BANDAGES/DRESSINGS) IMPLANT
BNDG ESMARK 6X9 LF (GAUZE/BANDAGES/DRESSINGS)
BNDG GAUZE ELAST 4 BULKY (GAUZE/BANDAGES/DRESSINGS) ×3 IMPLANT
CANISTER SUCT 3000ML PPV (MISCELLANEOUS) ×3 IMPLANT
CANNULA VESSEL 3MM 2 BLNT TIP (CANNULA) ×3 IMPLANT
CATH EMB 4FR 80CM (CATHETERS) ×9 IMPLANT
CLIP VESOCCLUDE MED 24/CT (CLIP) ×3 IMPLANT
CLIP VESOCCLUDE SM WIDE 24/CT (CLIP) ×3 IMPLANT
COVER WAND RF STERILE (DRAPES) IMPLANT
CUFF TOURNIQUET SINGLE 24IN (TOURNIQUET CUFF) IMPLANT
CUFF TOURNIQUET SINGLE 34IN LL (TOURNIQUET CUFF) IMPLANT
CUFF TOURNIQUET SINGLE 44IN (TOURNIQUET CUFF) IMPLANT
DECANTER SPIKE VIAL GLASS SM (MISCELLANEOUS) IMPLANT
DERMABOND ADVANCED (GAUZE/BANDAGES/DRESSINGS) ×1
DERMABOND ADVANCED .7 DNX12 (GAUZE/BANDAGES/DRESSINGS) ×2 IMPLANT
DRAIN SNY WOU (WOUND CARE) IMPLANT
DRAPE X-RAY CASS 24X20 (DRAPES) IMPLANT
ELECT REM PT RETURN 9FT ADLT (ELECTROSURGICAL) ×3
ELECTRODE REM PT RTRN 9FT ADLT (ELECTROSURGICAL) ×2 IMPLANT
EVACUATOR SILICONE 100CC (DRAIN) IMPLANT
GAUZE SPONGE 4X4 12PLY STRL (GAUZE/BANDAGES/DRESSINGS) ×3 IMPLANT
GLOVE BIO SURGEON STRL SZ 6.5 (GLOVE) ×9 IMPLANT
GLOVE BIO SURGEON STRL SZ7.5 (GLOVE) ×3 IMPLANT
GOWN STRL REUS W/ TWL LRG LVL3 (GOWN DISPOSABLE) ×6 IMPLANT
GOWN STRL REUS W/TWL LRG LVL3 (GOWN DISPOSABLE) ×3
GRAFT VASC PATCH XENOSURE 1X14 (Vascular Products) ×3 IMPLANT
KIT BASIN OR (CUSTOM PROCEDURE TRAY) ×3 IMPLANT
KIT TURNOVER KIT B (KITS) ×3 IMPLANT
NS IRRIG 1000ML POUR BTL (IV SOLUTION) ×6 IMPLANT
PACK PERIPHERAL VASCULAR (CUSTOM PROCEDURE TRAY) ×3 IMPLANT
PAD ABD 8X10 STRL (GAUZE/BANDAGES/DRESSINGS) ×3 IMPLANT
PAD ARMBOARD 7.5X6 YLW CONV (MISCELLANEOUS) ×6 IMPLANT
SET COLLECT BLD 21X3/4 12 (NEEDLE) IMPLANT
SPONGE LAP 18X18 RF (DISPOSABLE) ×3 IMPLANT
SPONGE SURGIFOAM ABS GEL 100 (HEMOSTASIS) IMPLANT
STAPLER VISISTAT 35W (STAPLE) IMPLANT
STOPCOCK 4 WAY LG BORE MALE ST (IV SETS) ×6 IMPLANT
SUT PROLENE 5 0 C 1 24 (SUTURE) ×12 IMPLANT
SUT PROLENE 6 0 CC (SUTURE) ×3 IMPLANT
SUT PROLENE 7 0 BV 1 (SUTURE) IMPLANT
SUT PROLENE 7 0 BV1 MDA (SUTURE) IMPLANT
SUT SILK 2 0 SH (SUTURE) ×3 IMPLANT
SUT SILK 3 0 (SUTURE)
SUT SILK 3-0 18XBRD TIE 12 (SUTURE) IMPLANT
SUT VIC AB 2-0 CT1 27 (SUTURE) ×1
SUT VIC AB 2-0 CT1 TAPERPNT 27 (SUTURE) ×2 IMPLANT
SUT VIC AB 2-0 CTX 36 (SUTURE) ×6 IMPLANT
SUT VIC AB 3-0 SH 27 (SUTURE) ×2
SUT VIC AB 3-0 SH 27X BRD (SUTURE) ×4 IMPLANT
SYR 3ML LL SCALE MARK (SYRINGE) ×3 IMPLANT
SYR BULB IRRIGATION 50ML (SYRINGE) ×3 IMPLANT
TAPE CLOTH SURG 4X10 WHT LF (GAUZE/BANDAGES/DRESSINGS) ×3 IMPLANT
TAPE UMBILICAL COTTON 1/8X30 (MISCELLANEOUS) IMPLANT
TOWEL GREEN STERILE (TOWEL DISPOSABLE) ×3 IMPLANT
TRAY FOLEY MTR SLVR 16FR STAT (SET/KITS/TRAYS/PACK) ×3 IMPLANT
TUBING EXTENTION W/L.L. (IV SETS) IMPLANT
UNDERPAD 30X30 (UNDERPADS AND DIAPERS) ×3 IMPLANT
WATER STERILE IRR 1000ML POUR (IV SOLUTION) ×3 IMPLANT

## 2018-06-25 NOTE — H&P (Signed)
Patient name: Jose Sellers MRN: [id number] DOB: [birth year] Sex: male  HPI: BURLON CENTRELLA is a 58 y.o. male, s/p left fem pop bypass and subsequent infection.  He had graft removal and vein patch of left femoral artery by Dr Early about 10 days ago.  He was sent home with a VAC on the groin about 5 days ago.  He developed bleeding from the left groin earlier today and was transferred from [facility] ER.  He has not been hypotensive.  He has a wound on the left foot.  Other medical problems include afib COPD CAD.  He has HD MWF.  Past Medical History:  Diagnosis Date  . A-fib ([place])   . AICD (automatic cardioverter/defibrillator) present   . Anal fissure   . Anxiety   . Asthma   . CAD (coronary artery disease)   . CHF (congestive heart failure) ([place])   . COPD (chronic obstructive pulmonary disease) ([place])   . Degeneration of lumbar or lumbosacral intervertebral disc   . DM (diabetes mellitus), type 2, uncontrolled w/neurologic complication ([place])   . Dyspnea    with exertion  . Dysthymic disorder   . End stage renal disease on dialysis [facility])    dialysis M-W-F   [address] in [place]  . Essential (primary) hypertension   . GERD (gastroesophageal reflux disease)   . History of hiatal hernia   . Hx of colonic polyps   . Hyperlipemia   . Myocardial infarction ([place])   . Peripheral vascular disease ([place])   . Pneumonia   . Recurrent major depression ([place])   . Sleep apnea   . Ventral hernia without obstruction or gangrene   . Wears dentures    Past Surgical History:  Procedure Laterality Date  . ABDOMINAL AORTOGRAM W/LOWER EXTREMITY N/A 04/20/2018   Procedure: ABDOMINAL AORTOGRAM W/LOWER EXTREMITY;  Surgeon: Elam Dutch, MD;  Location: [facility];  Service: Cardiovascular;  Laterality: N/A;  . AV FISTULA PLACEMENT Right 02/13/2018   Procedure: CREATION OF RADIOCEPHALIC ARTERIOVENOUS FISTULA RIGHT ARM;  Surgeon: Elam Dutch, MD;  Location: [facility];  Service:  Vascular;  Laterality: Right;  . CAD s/p cabbage    . CARDIAC DEFIBRILLATOR PLACEMENT  2012  . CATARACT EXTRACTION W/ INTRAOCULAR LENS IMPLANT    . COLONOSCOPY  12/27/2013   Colonic polyps status post polypectomy.  Mild sigmoid diverticulosis. Internal hemorrhoids. Tubular adenoma.   . CORONARY ANGIOPLASTY    . CORONARY STENT PLACEMENT    . ENDARTERECTOMY Left 05/04/2018   Procedure: LEFT FEMORAL ENDARTERECTOMY;  Surgeon: [name], MD;  Location[facility] OR;  Service: Vascular;  Laterality: Left;  . ESOPHAGOGASTRODUODENOSCOPY  12/01/2016   Mild gastritis.   . FEMORAL-POPLITEAL BYPASS GRAFT Left 05/04/2018   Procedure: LEFT FEMORAL TO ABOVE KNEE POPLITEAL BYPASS GRAFT USING 6MM X 80CM GORE PROPATEN RINGED GRAFT;  Surgeon: [name], MD;  Location: [facility];  Service: Vascular;  Laterality: Left;  . FEMORAL-POPLITEAL BYPASS GRAFT Left 06/14/2018   Procedure: Removal of Infected Left Femoral popliteal Bypass Graft  with vein patch angioplasty of common femoral and Popiteal artery;  Surgeon: [name], MD;  Location: [facility] OR;  Service: Vascular;  Laterality: Left;  . heart bypass    . HERNIA REPAIR    . I&D EXTREMITY Left 06/14/2018   Procedure: IRRIGATION AND DEBRIDEMENT LEFT GROIN WOUND AND LEFT POPLITEAL WOUND.;  Surgeon: [name], MD;  Location: [facility];  Service: Vascular;  Laterality: Left;  .  KNEE SURGERY    . PATCH ANGIOPLASTY Left 05/04/2018   Procedure: Left Common Femoral/Profunda Patch Angioplasty using Hemashield Platinum Finesse Patch;  Surgeon: [name], MD;  Location: [facility] OR;  Service: Vascular;  Laterality: Left;    Family History  Problem Relation Age of Onset  . Colon cancer Neg Hx     SOCIAL HISTORY: Social History   Socioeconomic History  . Marital status: Married    Spouse name: Not on file  . Number of children: Not on file  . Years of education: Not on file  . Highest education level: Not on file  Occupational History  . Not on file  Social Needs  .  Financial resource strain: Not on file  . Food insecurity:    Worry: Not on file    Inability: Not on file  . Transportation needs:    Medical: Not on file    Non-medical: Not on file  Tobacco Use  . Smoking status: Current Some Day Smoker    Packs/day: 0.25    Years: 35.00    Pack years: 8.75    Types: Cigarettes  . Smokeless tobacco: Never Used  Substance and Sexual Activity  . Alcohol use: Not Currently  . Drug use: Never  . Sexual activity: Not on file  Lifestyle  . Physical activity:    Days per week: Not on file    Minutes per session: Not on file  . Stress: Not on file  Relationships  . Social connections:    Talks on phone: Not on file    Gets together: Not on file    Attends religious service: Not on file    Active member of club or organization: Not on file    Attends meetings of clubs or organizations: Not on file    Relationship status: Not on file  . Intimate partner violence:    Fear of current or ex partner: Not on file    Emotionally abused: Not on file    Physically abused: Not on file    Forced sexual activity: Not on file  Other Topics Concern  . Not on file  Social History Narrative  . Not on file    Allergies  Allergen Reactions  . Albuterol Palpitations    SVT  . Prednisone Shortness Of Breath    Wife stated that patient develops breathing issues.  . Fluticasone Furoate-Vilanterol Palpitations  . Morphine Rash    Current Facility-Administered Medications  Medication Dose Route Frequency Provider Last Rate Last Dose  . ceFAZolin (ANCEF) IVPB 1 g/50 mL premix  1 g Intravenous STAT Elam Dutch, MD       Current Outpatient Medications  Medication Sig Dispense Refill  . amiodarone (PACERONE) 200 MG tablet Take 200 mg by mouth 2 (two) times daily.     Marland Kitchen aspirin 81 MG tablet Take 81 mg by mouth daily.     Marland Kitchen atorvastatin (LIPITOR) 40 MG tablet Take 40 mg by mouth 2 (two) times daily.     . B Complex-C-Folic Acid (DIALYVITE 702) 0.8 MG  TABS Take 1 tablet by mouth daily.   4  . calcitRIOL (ROCALTROL) 0.25 MCG capsule Take 0.25 mcg by mouth every Monday, Wednesday, and Friday with hemodialysis.     Marland Kitchen esomeprazole (NEXIUM) 40 MG capsule Take 40 mg by mouth daily before breakfast.     . ferric citrate (AURYXIA) 1 GM 210 MG(Fe) tablet Take 420 mg by mouth 3 (three) times daily with meals.     Marland Kitchen  FLUoxetine (PROZAC) 20 MG capsule Take 20 mg by mouth daily.     . furosemide (LASIX) 40 MG tablet Take 40 mg by mouth See admin instructions. Non dialysis days. Do not take on Monday, Wednesday and Friday    . gabapentin (NEURONTIN) 300 MG capsule Take 1 capsule (300 mg total) by mouth 3 (three) times daily. 90 capsule 3  . Insulin Glargine (LANTUS SOLOSTAR) 100 UNIT/ML Solostar Pen Inject 8-12 Units into the skin at bedtime as needed (blood sugar).     . insulin lispro (HUMALOG KWIKPEN) 100 UNIT/ML KwikPen Inject 0-32 Units into the skin 3 (three) times daily. Sliding Scale Insulin     . levalbuterol (XOPENEX) 0.63 MG/3ML nebulizer solution Inhale 0.63 mg into the lungs 2 (two) times daily.   1  . lidocaine-prilocaine (EMLA) cream Apply 1 application topically See admin instructions. Apply small amount to access site 1-2 hours prior to dialysis.  12  . metoprolol tartrate (LOPRESSOR) 25 MG tablet Take 12.5 mg by mouth 2 (two) times daily.     . nitroGLYCERIN (NITROSTAT) 0.4 MG SL tablet Place 0.4 mg under the tongue every 5 (five) minutes as needed for chest pain.    Marland Kitchen oxycodone (ROXICODONE) 30 MG immediate release tablet Take 30 mg by mouth every 8 (eight) hours as needed for pain.     Marland Kitchen oxyCODONE-acetaminophen (PERCOCET) 7.5-325 MG tablet Take 1 tablet by mouth every 6 (six) hours as needed for severe pain. 25 tablet 0  . SANTYL ointment Apply 1 application topically daily. 15 g 1  . tiotropium (SPIRIVA) 18 MCG inhalation capsule Place 18 mcg into inhaler and inhale daily.     . vancomycin (VANCOCIN) 50 mg/mL oral solution Take 2.5 mLs (125  mg total) by mouth every 6 (six) hours for 12 days. 120 mL 0     Physical Examination  Vitals:   06/20/2018 1515 06/07/2018 1520 06/24/2018 1524  BP: 90/62 90/62   Pulse: 85 85   Resp: 16 18   Temp:  97.9 F (36.6 C)   TempSrc:  Oral   SpO2: 96% 97%   Weight:   80.7 kg  Height:   6' (1.829 m)    Body mass index is 24.14 kg/m.  General:  Alert and oriented, no acute distress Left groin: large hematoma and clot about 200 cc worth of blood, no active bleeding now, granulation tissue filling groin   ASSESSMENT:  Bleeding groin post vein patch   PLAN:  Exploration left groin for bleeding emergently  Type and cross CBC BMET   Ruta Hinds, MD [facility] [place]: [phone] Pager: [phone]

## 2018-06-25 NOTE — Progress Notes (Signed)
Patient to receive 2 units PRBC per Dr. Oneida Alar for SBPs in the 70s-80s. Spoke with wife, Jose Sellers for consent via telephone.

## 2018-06-25 NOTE — Op Note (Signed)
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  The patient placed supine position operating table.  After induction general anesthesia and endotracheal intubation, patient was prepped and draped in usual sterile fashion from the umbilicus to the knee.  Next a pre-existing open wound in the groin was deepened down to the level left common femoral artery.  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  There was some active bleeding from areas on the patch.  Dissection was fairly tedious due to dense scar and adhesions.  I was able to put a vessel loop around the distal external iliac artery above the circumflex iliac branches.  I did ligate the medial circumflex iliac branch to provide additional exposure.  The lateral circumflex iliac branch was controlled with a vessel loop.  At this point I had reasonable control proximally and the patch was taken down.  The distal common femoral or superficial femoral artery was controlled with a balloon.  It was difficult to determine this anatomy due to scar tissue.  There was a large branch proceeding from the midportion of the common femoral artery that went medially I did not believe this was the profunda but we did save this and placed a vessel loop around it.  There was some backbleeding from this.  Patient was given 5000 intravenous heparin.  He was given additional 5000 units of heparin during course of the case.  After the patch is been completely taken down the wound was thoroughly irrigated with normal saline solution.   A #4 Fogarty was used to control the proximal external iliac artery with a three-way stopcock.  A #4 Fogarty was also used for distal control.  Bovine pericardial patch was then sewn on as a patch angioplasty using a running 5-0 Prolene suture.  Just prior to completion of anastomosis it was 4 by backbled and thoroughly flushed.  Anastomosis was secured clamps released there was one area of bleeding in the midportion of the patch on the lateral wall.  This was repaired with a single 5 oh pledgeted stitch.  Hemostasis was obtained.  The patient was given 50 mg of protamine.  The wound was thoroughly irrigated with 2 L of normal saline solution.  A culture was taken from the left groin at the beginning of the case.  After hemostasis was obtained the groin was closed in 2 layers with a running 2-0 Vicryl suture in the deep layer over the patch and an additional 3-0 Vicryl running suture over the subcutaneous tissues.  The skin was left open.  The patient tired procedure well and there were no complications.  The instrument sponge needle count was correct in the case.  The patient was taken the recovery room in stable condition.  Ruta Hinds, MD [facility] Office: [phone] Pager: [phone]

## 2018-06-25 NOTE — Anesthesia Preprocedure Evaluation (Addendum)
Anesthesia Evaluation  Patient identified by MRN, date of birth, ID band Patient awake  General Assessment Comment:58 year old male with extensive past medical history as below including end-stage renal disease, severe peripheral vascular disease status post recent femoral bypass graft complicated by infection, currently with wound VAC in place, here with bleeding from his wound site.  Per report, the patient was overall recovering well.  He went to dialysis today.  At around 11:00, he began to notice bleeding oozing from his pants and wound VAC.  He reports that since then, he has been unable to control the bleeding.  He was taken to [facility] and sent here for further evaluation.  Patient denies any new trauma to the area.  Denies any new numbness or weakness or pain in his foot.  No lightheadedness.  No chest pain or shortness of breath.  No other complaints.  Reviewed: Allergy & Precautions, NPO status , Patient's Chart, lab work & pertinent test results  Airway Mallampati: II  TM Distance: >3 FB Neck ROM: Full    Dental  (+) Edentulous Upper   Pulmonary asthma , COPD, Current Smoker,    Pulmonary exam normal breath sounds clear to auscultation       Cardiovascular hypertension, + CAD, + Past MI, + Peripheral Vascular Disease and +CHF  Normal cardiovascular exam+ Cardiac Defibrillator  Rhythm:Regular Rate:Normal  EF 25%   Neuro/Psych negative neurological ROS  negative psych ROS   GI/Hepatic Neg liver ROS, hiatal hernia, GERD  ,  Endo/Other  diabetesHypothyroidism   Renal/GU DialysisRenal disease  negative genitourinary   Musculoskeletal negative musculoskeletal ROS (+)   Abdominal   Peds negative pediatric ROS (+)  Hematology negative hematology ROS (+)   Anesthesia Other Findings   Reproductive/Obstetrics negative OB ROS                            Anesthesia Physical Anesthesia  Plan  ASA: IV and emergent  Anesthesia Plan: General   Post-op Pain Management:    Induction: Intravenous and Rapid sequence  PONV Risk Score and Plan: Ondansetron and Treatment may vary due to age or medical condition  Airway Management Planned: Oral ETT  Additional Equipment:   Intra-op Plan:   Post-operative Plan: Possible Post-op intubation/ventilation  Informed Consent: I have reviewed the patients History and Physical, chart, labs and discussed the procedure including the risks, benefits and alternatives for the proposed anesthesia with the patient or authorized representative who has indicated his/her understanding and acceptance.     Dental advisory given  Plan Discussed with: CRNA and Surgeon  Anesthesia Plan Comments:        Anesthesia Quick Evaluation

## 2018-06-25 NOTE — ED Triage Notes (Signed)
Patient presents to ed via [employer] he had dialysis this am at approx. 1230 today at home his left upper thigh started bleeding , patient has surgery on his left great toe and had a stent placed in his left upper leg to help shunt blood to his foot. , there are 2 wounds to his left upper thigh wound vac is attached to the lower wound, upper wound is bleeding. Ems holding direct pressure. Patient is alert and oriented. EDP at bedside , Dr. Oneida Alar at bedside.

## 2018-06-25 NOTE — Telephone Encounter (Signed)
Levada Dy from [facility] went to see patient today and he was enroute to [facility] for possible hemorrhage from wound vac site.  Will make MD aware.    Thurston Hole., LPN

## 2018-06-25 NOTE — Progress Notes (Signed)
Pharmacy Antibiotic Note  Jose Sellers is a 58 y.o. male admitted on 06/18/2018 with groin wound infection.  Pharmacy has been consulted for vancomycin dosing.  ESRD. Had HD this am.   Plan: Give vancomycin 1,500mg  IV x 1, then start vancomycin 1g IV QHD-MWF Monitor clinical picture, renal function, vanc levels prn F/U C&S, abx deescalation / LOT  Height: 6' (182.9 cm) Weight: 178 lb (80.7 kg) IBW/kg (Calculated) : 77.6  Temp (24hrs), Avg:98 F (36.7 C), Min:97.9 F (36.6 C), Max:98.1 F (36.7 C)  Recent Labs  Lab 06/20/18 0830 06/20/18 0945 05/28/2018 1525  WBC 8.5  --  11.5*  CREATININE 5.61*  --  3.35*  VANCOTROUGH  --  16  --     Estimated Creatinine Clearance: 27 mL/min (A) (by C-G formula based on SCr of 3.35 mg/dL (H)).    Allergies  Allergen Reactions  . Albuterol Palpitations    SVT  . Prednisone Shortness Of Breath    Wife stated that patient develops breathing issues.  . Fluticasone Furoate-Vilanterol Palpitations  . Morphine Rash    Thank you for allowing pharmacy to be a part of this patient's care.  Reginia Naas 06/12/2018 8:31 PM

## 2018-06-25 NOTE — ED Provider Notes (Signed)
[facility] EMERGENCY DEPARTMENT Provider Note   CSN: [id number] Arrival date & time: 06/04/2018  1506    History   Chief Complaint Chief Complaint  Patient presents with  . Wound Check    HPI Jose Sellers is a 58 y.o. male.     HPI   58 year old male with extensive past medical history as below including end-stage renal disease, severe peripheral vascular disease status post recent femoral bypass graft complicated by infection, currently with wound VAC in place, here with bleeding from his wound site.  Per report, the patient was overall recovering well.  He went to dialysis today.  At around 11:00, he began to notice bleeding oozing from his pants and wound VAC.  He reports that since then, he has been unable to control the bleeding.  He was taken to [facility] and sent here for further evaluation.  Patient denies any new trauma to the area.  Denies any new numbness or weakness or pain in his foot.  No lightheadedness.  No chest pain or shortness of breath.  No other complaints.  Past Medical History:  Diagnosis Date  . A-fib ([place])   . AICD (automatic cardioverter/defibrillator) present   . Anal fissure   . Anxiety   . Asthma   . CAD (coronary artery disease)   . CHF (congestive heart failure) ([place])   . COPD (chronic obstructive pulmonary disease) ([place])   . Degeneration of lumbar or lumbosacral intervertebral disc   . DM (diabetes mellitus), type 2, uncontrolled w/neurologic complication ([place])   . Dyspnea    with exertion  . Dysthymic disorder   . End stage renal disease on dialysis [facility])    dialysis M-W-F   [address] in [place]  . Essential (primary) hypertension   . GERD (gastroesophageal reflux disease)   . History of hiatal hernia   . Hx of colonic polyps   . Hyperlipemia   . Myocardial infarction ([place])   . Peripheral vascular disease ([place])   . Pneumonia   . Recurrent major depression ([place])   . Sleep apnea   . Ventral hernia  without obstruction or gangrene   . Wears dentures     Patient Active Problem List   Diagnosis Date Noted  . Left groin pain 06/12/2018  . Open wound of left thigh 06/11/2018  . Chronic atrial fibrillation 06/11/2018  . Cellulitis of left groin 06/11/2018  . Thrombocytopenia ([place]) 06/11/2018  . Wound infection 06/11/2018  . PAD (peripheral artery disease) ([place]) 05/04/2018  . AICD (automatic cardioverter/defibrillator) present   . Typical atrial flutter ([place]) 10/16/2017  . Acute respiratory failure with hypoxia ([place]) 09/08/2017  . C. difficile diarrhea 09/08/2017  . Peritonitis associated with peritoneal dialysis ([place]) 08/16/2017  . At high risk for falls 07/27/2017  . Arteriosclerotic vascular disease 03/16/2017  . Chronic heart disease 03/16/2017  . ESRD on dialysis ([place]) 03/16/2017  . Hypercholesterolemia 03/16/2017  . Hypertensive disorder 03/16/2017  . Anemia of unknown etiology 07/07/2016  . Malaise and fatigue 07/07/2016  . Chronic pain syndrome 06/20/2016  . Secondary hyperparathyroidism ([place]) 12/16/2015  . Stage 5 chronic kidney disease ([place]) 12/16/2015  . Chronic systolic congestive heart failure ([place]) 11/25/2015  . Tobacco use 11/25/2015  . Ventral hernia without obstruction or gangrene 10/26/2015  . Psychosexual dysfunction with inhibited sexual excitement 08/27/2015  . Anxiety 08/26/2015  . COPD (chronic obstructive pulmonary disease) ([place]) 08/26/2015  . DDD (degenerative disc disease), lumbosacral 08/26/2015  . GERD (gastroesophageal reflux disease)  08/26/2015  . Hypothyroidism 08/26/2015  . Microalbuminuria 08/26/2015  . Mixed hyperlipidemia 08/26/2015  . Osteoarthritis of multiple joints 08/26/2015  . Peripheral vascular disease ([place]) 08/26/2015  . Abdominal hernia 06/16/2015  . CAD (coronary artery disease) 04/07/2015  . 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 OSA (obstructive sleep apnea) 04/07/2015  . Essential hypertension  03/06/2015  . Retinopathy, diabetic, bilateral ([place]) 03/06/2015  . Uncontrolled type 2 diabetes mellitus with diabetic polyneuropathy, with long-term current use of insulin ([place]) 03/06/2015  . Atherosclerosis with claudication of extremity ([place]) 08/01/2014  . Mild episode of recurrent major depressive disorder ([place]) 05/01/2013  . Panic disorder 05/01/2013    Past Surgical History:  Procedure Laterality Date  . ABDOMINAL AORTOGRAM W/LOWER EXTREMITY N/A 04/20/2018   Procedure: ABDOMINAL AORTOGRAM W/LOWER EXTREMITY;  Surgeon: Elam Dutch, MD;  Location: [facility] CV LAB;  Service: Cardiovascular;  Laterality: N/A;  . AV FISTULA PLACEMENT Right 02/13/2018   Procedure: CREATION OF RADIOCEPHALIC ARTERIOVENOUS FISTULA RIGHT ARM;  Surgeon: Elam Dutch, MD;  Location: [facility];  Service: Vascular;  Laterality: Right;  . CAD s/p cabbage    . CARDIAC DEFIBRILLATOR PLACEMENT  2012  . CATARACT EXTRACTION W/ INTRAOCULAR LENS IMPLANT    . COLONOSCOPY  12/27/2013   Colonic polyps status post polypectomy.  Mild sigmoid diverticulosis. Internal hemorrhoids. Tubular adenoma.   . CORONARY ANGIOPLASTY    . CORONARY STENT PLACEMENT    . ENDARTERECTOMY Left 05/04/2018   Procedure: LEFT FEMORAL ENDARTERECTOMY;  Surgeon: [name], MD;  Location: [facility] OR;  Service: Vascular;  Laterality: Left;  . ESOPHAGOGASTRODUODENOSCOPY  12/01/2016   Mild gastritis.   . FEMORAL-POPLITEAL BYPASS GRAFT Left 05/04/2018   Procedure: LEFT FEMORAL TO ABOVE KNEE POPLITEAL BYPASS GRAFT USING 6MM X 80CM GORE PROPATEN RINGED GRAFT;  Surgeon: [name], MD;  Location: [facility];  Service: Vascular;  Laterality: Left;  . FEMORAL-POPLITEAL BYPASS GRAFT Left 06/14/2018   Procedure: Removal of Infected Left Femoral popliteal Bypass Graft  with vein patch angioplasty of common femoral and Popiteal artery;  Surgeon: [name], MD;  Location[facility];  Service: Vascular;  Laterality: Left;  . heart bypass    . HERNIA REPAIR    . I&D  EXTREMITY Left 06/14/2018   Procedure: IRRIGATION AND DEBRIDEMENT LEFT GROIN WOUND AND LEFT POPLITEAL WOUND.;  Surgeon: [name], MD;  Location: [facility];  Service: Vascular;  Laterality: Left;  . KNEE SURGERY    . PATCH ANGIOPLASTY Left 05/04/2018   Procedure: Left Common Femoral/Profunda Patch Angioplasty using Hemashield Platinum Finesse Patch;  Surgeon: [name], MD;  Location: [facility] OR;  Service: Vascular;  Laterality: Left;        Home Medications    Prior to Admission medications   Medication Sig Start Date End Date Taking? Authorizing Provider  amiodarone (PACERONE) 200 MG tablet Take 200 mg by mouth 2 (two) times daily.     [provider]  aspirin 81 MG tablet Take 81 mg by mouth daily.  01/20/11   [provider]  atorvastatin (LIPITOR) 40 MG tablet Take 40 mg by mouth 2 (two) times daily.     [provider]  B Complex-C-Folic Acid (DIALYVITE 384) 0.8 MG TABS Take 1 tablet by mouth daily.  12/19/17   [provider]  calcitRIOL (ROCALTROL) 0.25 MCG capsule Take 0.25 mcg by mouth every Monday, Wednesday, and Friday with hemodialysis.     [provider]  esomeprazole (NEXIUM) 40 MG capsule  Take 40 mg by mouth daily before breakfast.     [provider]  ferric citrate (AURYXIA) 1 GM 210 MG(Fe) tablet Take 420 mg by mouth 3 (three) times daily with meals.     [provider]  FLUoxetine (PROZAC) 20 MG capsule Take 20 mg by mouth daily.  11/07/17   [provider]  furosemide (LASIX) 40 MG tablet Take 40 mg by mouth See admin instructions. Non dialysis days. Do not take on Monday, Wednesday and Friday    [provider]  gabapentin (NEURONTIN) 300 MG capsule Take 1 capsule (300 mg total) by mouth 3 (three) times daily. 06/22/18   Landis Martins, DPM  Insulin Glargine (LANTUS SOLOSTAR) 100 UNIT/ML Solostar Pen Inject 8-12 Units into the skin at bedtime as needed (blood sugar).     [provider]   insulin lispro (HUMALOG KWIKPEN) 100 UNIT/ML KwikPen Inject 0-32 Units into the skin 3 (three) times daily. Sliding Scale Insulin     [provider]  levalbuterol (XOPENEX) 0.63 MG/3ML nebulizer solution Inhale 0.63 mg into the lungs 2 (two) times daily.  10/09/17   [provider]  lidocaine-prilocaine (EMLA) cream Apply 1 application topically See admin instructions. Apply small amount to access site 1-2 hours prior to dialysis. 01/29/18   [provider]  metoprolol tartrate (LOPRESSOR) 25 MG tablet Take 12.5 mg by mouth 2 (two) times daily.  03/10/18   [provider]  nitroGLYCERIN (NITROSTAT) 0.4 MG SL tablet Place 0.4 mg under the tongue every 5 (five) minutes as needed for chest pain.    [provider]  oxycodone (ROXICODONE) 30 MG immediate release tablet Take 30 mg by mouth every 8 (eight) hours as needed for pain.     [provider]  oxyCODONE-acetaminophen (PERCOCET) 7.5-325 MG tablet Take 1 tablet by mouth every 6 (six) hours as needed for severe pain. 05/06/18   Dagoberto Ligas, PA-C  SANTYL ointment Apply 1 application topically daily. 05/29/18   [name], MD  tiotropium (SPIRIVA) 18 MCG inhalation capsule Place 18 mcg into inhaler and inhale daily.  03/10/18   [provider]  vancomycin (VANCOCIN) 50 mg/mL oral solution Take 2.5 mLs (125 mg total) by mouth every 6 (six) hours for 12 days. 06/19/18 07/01/18  Oswald Hillock, MD    Family History Family History  Problem Relation Age of Onset  . Colon cancer Neg Hx     Social History Social History   Tobacco Use  . Smoking status: Current Some Day Smoker    Packs/day: 0.25    Years: 35.00    Pack years: 8.75    Types: Cigarettes  . Smokeless tobacco: Never Used  Substance Use Topics  . Alcohol use: Not Currently  . Drug use: Never     Allergies   Albuterol; Prednisone; Fluticasone furoate-vilanterol; and Morphine   Review of Systems Review of Systems   Constitutional: Negative for chills, fatigue and fever.  HENT: Negative for congestion and rhinorrhea.   Eyes: Negative for visual disturbance.  Respiratory: Negative for cough, shortness of breath and wheezing.   Cardiovascular: Negative for chest pain and leg swelling.  Gastrointestinal: Negative for abdominal pain, diarrhea, nausea and vomiting.  Genitourinary: Negative for dysuria and flank pain.  Musculoskeletal: Negative for neck pain and neck stiffness.  Skin: Positive for wound. Negative for rash.  Allergic/Immunologic: Negative for immunocompromised state.  Neurological: Negative for syncope and headaches.  All other systems reviewed and are negative.  Physical Exam Updated Vital Signs There were no vitals taken for this visit.  Physical Exam Vitals signs and nursing note reviewed.  Constitutional:      General: He is not in acute distress.    Appearance: He is well-developed.  HENT:     Head: Normocephalic and atraumatic.  Eyes:     Conjunctiva/sclera: Conjunctivae normal.  Neck:     Musculoskeletal: Neck supple.  Cardiovascular:     Rate and Rhythm: Normal rate and regular rhythm.     Heart sounds: Normal heart sounds. No murmur. No friction rub.  Pulmonary:     Effort: Pulmonary effort is normal. No respiratory distress.     Breath sounds: Normal breath sounds. No wheezing or rales.  Abdominal:     General: There is no distension.     Palpations: Abdomen is soft.     Tenderness: There is no abdominal tenderness.  Skin:    General: Skin is warm.     Capillary Refill: Capillary refill takes less than 2 seconds.  Neurological:     Mental Status: He is alert and oriented to person, place, and time.     Motor: No abnormal muscle tone.     LOWER EXTREMITY EXAM: LEFT  INSPECTION & PALPATION: Left inguinal surgical site with wound vac in place. This was removed with Dr. Oneida Alar in room, with small amount of bleeding noted from medial aspect of wound at site  of prior graft. No pulsatility.  VASCULAR: Toes warm well perfused Non palpable peripheral pulses  COMPARTMENTS: Soft, warm, well-perfused No pain with passive extension No parethesias   ED Treatments / Results  Labs (all labs ordered are listed, but only abnormal results are displayed) Labs Reviewed - No data to display  EKG None  Radiology No results found.  Procedures Procedures (including critical care time)  Medications Ordered in ED Medications  ceFAZolin (ANCEF) IVPB 1 g/50 mL premix (has no administration in time range)     Initial Impression / Assessment and Plan / ED Course  I have reviewed the triage vital signs and the nursing notes.  Pertinent labs & imaging results that were available during my care of the patient were reviewed by me and considered in my medical decision making (see chart for details).        58 yo M with PMHx as above here w/ bleeding from recent femoral graft site. Bleeding controlled w light pressure on arrival. Dr. Oneida Alar present immediately, will take to OR. Screening labs sent. No signs of infection on exam.  Final Clinical Impressions(s) / ED Diagnoses   Final diagnoses:  Bleeding from wound    ED Discharge Orders    None       Duffy Bruce, MD 06/05/2018 1520

## 2018-06-25 NOTE — Anesthesia Procedure Notes (Signed)
Procedure Name: Intubation Date/Time: 06/16/2018 3:54 PM Performed by: Alain Marion, CRNA Pre-anesthesia Checklist: Patient identified, Emergency Drugs available, Suction available and Patient being monitored Patient Re-evaluated:Patient Re-evaluated prior to induction Oxygen Delivery Method: Circle System Utilized Preoxygenation: Pre-oxygenation with 100% oxygen Induction Type: IV induction Ventilation: Mask ventilation without difficulty Laryngoscope Size: Miller and 2 Grade View: Grade I Tube type: Oral Tube size: 7.5 mm Number of attempts: 1 Airway Equipment and Method: Stylet and Oral airway Placement Confirmation: ETT inserted through vocal cords under direct vision,  positive ETCO2 and breath sounds checked- equal and bilateral Secured at: 22 cm Tube secured with: Tape Dental Injury: Teeth and Oropharynx as per pre-operative assessment

## 2018-06-26 ENCOUNTER — Encounter (HOSPITAL_COMMUNITY): Payer: Self-pay

## 2018-06-26 LAB — CBC
HCT: 20 % — ABNORMAL LOW (ref 39.0–52.0)
HCT: 21.4 % — ABNORMAL LOW (ref 39.0–52.0)
Hemoglobin: 6.4 g/dL — CL (ref 13.0–17.0)
Hemoglobin: 6.9 g/dL — CL (ref 13.0–17.0)
MCH: 26.3 pg (ref 26.0–34.0)
MCH: 27.4 pg (ref 26.0–34.0)
MCHC: 32 g/dL (ref 30.0–36.0)
MCHC: 32.2 g/dL (ref 30.0–36.0)
MCV: 82.3 fL (ref 80.0–100.0)
MCV: 84.9 fL (ref 80.0–100.0)
Platelets: 167 10*3/uL (ref 150–400)
Platelets: 180 10*3/uL (ref 150–400)
RBC: 2.43 MIL/uL — AB (ref 4.22–5.81)
RBC: 2.52 MIL/uL — ABNORMAL LOW (ref 4.22–5.81)
RDW: 16.5 % — ABNORMAL HIGH (ref 11.5–15.5)
RDW: 16.7 % — ABNORMAL HIGH (ref 11.5–15.5)
WBC: 10.5 10*3/uL (ref 4.0–10.5)
WBC: 13.5 10*3/uL — AB (ref 4.0–10.5)
nRBC: 0 % (ref 0.0–0.2)
nRBC: 0 % (ref 0.0–0.2)

## 2018-06-26 LAB — BASIC METABOLIC PANEL
ANION GAP: 8 (ref 5–15)
BUN: 21 mg/dL — ABNORMAL HIGH (ref 6–20)
CO2: 28 mmol/L (ref 22–32)
Calcium: 7.7 mg/dL — ABNORMAL LOW (ref 8.9–10.3)
Chloride: 100 mmol/L (ref 98–111)
Creatinine, Ser: 3.8 mg/dL — ABNORMAL HIGH (ref 0.61–1.24)
GFR calc non Af Amer: 17 mL/min — ABNORMAL LOW (ref >60)
GFR, EST AFRICAN AMERICAN: 19 mL/min — AB (ref >60)
Glucose, Bld: 89 mg/dL (ref 70–99)
Potassium: 3.9 mmol/L (ref 3.5–5.1)
Sodium: 136 mmol/L (ref 135–145)

## 2018-06-26 LAB — GLUCOSE, CAPILLARY
GLUCOSE-CAPILLARY: 84 mg/dL (ref 70–99)
Glucose-Capillary: 121 mg/dL — ABNORMAL HIGH (ref 70–99)
Glucose-Capillary: 121 mg/dL — ABNORMAL HIGH (ref 70–99)
Glucose-Capillary: 120 mg/dL — ABNORMAL HIGH (ref 70–99)

## 2018-06-26 MED ORDER — DARBEPOETIN ALFA 100 MCG/0.5ML IJ SOSY
100.0000 ug | PREFILLED_SYRINGE | INTRAMUSCULAR | Status: DC
  Administered 2018-06-27: 100 ug via INTRAVENOUS

## 2018-06-26 MED ORDER — CHLORHEXIDINE GLUCONATE CLOTH 2 % EX PADS
6.0000 | MEDICATED_PAD | Freq: Every day | CUTANEOUS | Status: DC
  Administered 2018-06-27 – 2018-07-01 (×5): 6 via TOPICAL

## 2018-06-26 MED ORDER — GABAPENTIN 100 MG PO CAPS
100.0000 mg | ORAL_CAPSULE | Freq: Three times a day (TID) | ORAL | Status: DC
  Administered 2018-06-26 – 2018-07-07 (×31): 100 mg via ORAL
  Filled 2018-06-26 (×31): qty 1

## 2018-06-26 MED ORDER — CALCITRIOL 0.25 MCG PO CAPS
0.5000 ug | ORAL_CAPSULE | ORAL | Status: DC
  Administered 2018-06-27 – 2018-07-09 (×5): 0.5 ug via ORAL
  Filled 2018-06-26: qty 2

## 2018-06-26 MED ORDER — PRO-STAT SUGAR FREE PO LIQD
30.0000 mL | Freq: Two times a day (BID) | ORAL | Status: DC
  Administered 2018-06-26 – 2018-07-06 (×22): 30 mL via ORAL
  Filled 2018-06-26 (×23): qty 30

## 2018-06-26 NOTE — Progress Notes (Signed)
PT Cancellation Note  Patient Details Name: Jose Sellers MRN: [id number] DOB: April 20, 1960   Cancelled Treatment:    Reason Eval/Treat Not Completed: Medical issues which prohibited therapy. Hg 6.4 g/dL this morning and receiving 2nd unit. Will follow and initiate PT eval when appropriate and able.    Thelma Comp 06/26/2018, 8:21 AM   Rolinda Roan, PT, DPT Acute Rehabilitation Services Pager: [phone] Office: [phone]

## 2018-06-26 NOTE — Consult Note (Signed)
[facility] Renal Consultation Note    Indication for Consultation:  Management of ESRD/hemodialysis; anemia, hypertension/volume and secondary hyperparathyroidism PCP:  HPI: DMARI SCHUBRING is a 58 y.o. male with ESRD on MWF HD in [place], PMHx significant for DM, HTN, CAD, COPD, afib, hx AICD who presented to the ED yesterday with left goin hemorrhage from prior MRSA groin wound infection that started after he returned home from dialysis.  He was  d/c on 3/25 with a a week of Vancomycin last dose given yesterday. He was taken emergently to surgery yesterday for patch angioplasty of the CFA. hgb 3/25  at d/c was 9.5  hgg upon presentation yesterday 8.2 and was down to 6.4 post op - He was transfused 2 units PRBC with a post recent hgb of 6.9  K 3.9 early am.    At present he has pain in his left upper leg between incisions and his left heel hurts.  No N, V, D, fever or chills.  No SOB + fatigue. No problems with dialysis yesterday. VS were stable and he attained EDW. He has a TDC but have been using his AVF lately without problems  Past Medical History:  Diagnosis Date  . A-fib ([place])   . AICD (automatic cardioverter/defibrillator) present   . Anal fissure   . Anxiety   . Asthma   . CAD (coronary artery disease)   . CHF (congestive heart failure) ([place])   . COPD (chronic obstructive pulmonary disease) ([place])   . Degeneration of lumbar or lumbosacral intervertebral disc   . DM (diabetes mellitus), type 2, uncontrolled w/neurologic complication ([place])   . Dyspnea    with exertion  . Dysthymic disorder   . End stage renal disease on dialysis [facility])    dialysis M-W-F   [address] in [place]  . Essential (primary) hypertension   . GERD (gastroesophageal reflux disease)   . History of hiatal hernia   . Hx of colonic polyps   . Hyperlipemia   . Myocardial infarction ([place])   . Peripheral vascular disease ([place])   . Pneumonia   . Recurrent major depression ([place])   . Sleep  apnea   . Ventral hernia without obstruction or gangrene   . Wears dentures    Past Surgical History:  Procedure Laterality Date  . ABDOMINAL AORTOGRAM W/LOWER EXTREMITY N/A 04/20/2018   Procedure: ABDOMINAL AORTOGRAM W/LOWER EXTREMITY;  Surgeon: Elam Dutch, MD;  Location: [place] [facility];  Service: Cardiovascular;  Laterality: N/A;  . AV FISTULA PLACEMENT Right 02/13/2018   Procedure: CREATION OF RADIOCEPHALIC ARTERIOVENOUS FISTULA RIGHT ARM;  Surgeon: Elam Dutch, MD;  Location: [place];  Service: Vascular;  Laterality: Right;  . CAD s/p cabbage    . CARDIAC DEFIBRILLATOR PLACEMENT  2012  . CATARACT EXTRACTION W/ INTRAOCULAR LENS IMPLANT    . COLONOSCOPY  12/27/2013   Colonic polyps status post polypectomy.  Mild sigmoid diverticulosis. Internal hemorrhoids. Tubular adenoma.   . CORONARY ANGIOPLASTY    . CORONARY STENT PLACEMENT    . ENDARTERECTOMY Left 05/04/2018   Procedure: LEFT FEMORAL ENDARTERECTOMY;  Surgeon: [name], MD;  Location: [facility] OR;  Service: Vascular;  Laterality: Left;  . ESOPHAGOGASTRODUODENOSCOPY  12/01/2016   Mild gastritis.   . FEMORAL ARTERY EXPLORATION Left 06/23/2018   Procedure: LEFT GROIN EXPLORATION;  Surgeon: Elam Dutch, MD;  Location: [place];  Service: Vascular;  Laterality: Left;  . FEMORAL-POPLITEAL BYPASS GRAFT Left 05/04/2018   Procedure: LEFT FEMORAL TO ABOVE  KNEE POPLITEAL BYPASS GRAFT USING 6MM X 80CM GORE PROPATEN RINGED GRAFT;  Surgeon: [name], MD;  Location: [place];  Service: Vascular;  Laterality: Left;  . FEMORAL-POPLITEAL BYPASS GRAFT Left 06/14/2018   Procedure: Removal of Infected Left Femoral popliteal Bypass Graft  with vein patch angioplasty of common femoral and Popiteal artery;  Surgeon: [name], MD;  Location: [facility] OR;  Service: Vascular;  Laterality: Left;  . heart bypass    . HERNIA REPAIR    . I&D EXTREMITY Left 06/14/2018   Procedure: IRRIGATION AND DEBRIDEMENT LEFT GROIN WOUND AND LEFT POPLITEAL WOUND.;   Surgeon: [name], MD;  Location: [place];  Service: Vascular;  Laterality: Left;  . KNEE SURGERY    . PATCH ANGIOPLASTY Left 05/04/2018   Procedure: Left Common Femoral/Profunda Patch Angioplasty using Hemashield Platinum Finesse Patch;  Surgeon: [name], MD;  Location: [place];  Service: Vascular;  Laterality: Left;  . PATCH ANGIOPLASTY Left 05/27/2018   Procedure: PATCH ANGIOPLAST OF LEFT FEMORAL ARTERY;  Surgeon: Elam Dutch, MD;  Location: [facility] OR;  Service: Vascular;  Laterality: Left;   Family History  Problem Relation Age of Onset  . Colon cancer Neg Hx    Social History:  reports that he has been smoking cigarettes. He has a 8.75 pack-year smoking history. He has never used smokeless tobacco. He reports previous alcohol use. He reports that he does not use drugs. Allergies  Allergen Reactions  . Albuterol Palpitations    SVT  . Prednisone Shortness Of Breath    Wife stated that patient develops breathing issues.  . Fluticasone Furoate-Vilanterol Palpitations  . Morphine Rash   Prior to Admission medications   Medication Sig Start Date End Date Taking? Authorizing Provider  amiodarone (PACERONE) 200 MG tablet Take 200 mg by mouth 2 (two) times daily.     [provider]  aspirin 81 MG tablet Take 81 mg by mouth daily.  01/20/11   [provider]  atorvastatin (LIPITOR) 40 MG tablet Take 40 mg by mouth 2 (two) times daily.     [provider]  B Complex-C-Folic Acid (DIALYVITE 025) 0.8 MG TABS Take 1 tablet by mouth daily.  12/19/17   [provider]  calcitRIOL (ROCALTROL) 0.25 MCG capsule Take 0.25 mcg by mouth every Monday, Wednesday, and Friday with hemodialysis.     [provider]  esomeprazole (NEXIUM) 40 MG capsule Take 40 mg by mouth daily before breakfast.     [provider]  ferric citrate (AURYXIA) 1 GM 210 MG(Fe) tablet Take 420 mg by mouth 3 (three) times daily with meals.     [provider]   FLUoxetine (PROZAC) 20 MG capsule Take 20 mg by mouth daily.  11/07/17   [provider]  furosemide (LASIX) 40 MG tablet Take 40 mg by mouth See admin instructions. Non dialysis days. Do not take on Monday, Wednesday and Friday    [provider]  gabapentin (NEURONTIN) 300 MG capsule Take 1 capsule (300 mg total) by mouth 3 (three) times daily. 06/22/18   Landis Martins, DPM  Insulin Glargine (LANTUS SOLOSTAR) 100 UNIT/ML Solostar Pen Inject 8-12 Units into the skin at bedtime as needed (blood sugar).     [provider]  insulin lispro (HUMALOG KWIKPEN) 100 UNIT/ML KwikPen Inject 0-32 Units into the skin 3 (three) times daily. Sliding Scale Insulin     [provider]  levalbuterol (XOPENEX) 0.63 MG/3ML nebulizer solution Inhale 0.63 mg  into the lungs 2 (two) times daily.  10/09/17   [provider]  lidocaine-prilocaine (EMLA) cream Apply 1 application topically See admin instructions. Apply small amount to access site 1-2 hours prior to dialysis. 01/29/18   [provider]  metoprolol tartrate (LOPRESSOR) 25 MG tablet Take 12.5 mg by mouth 2 (two) times daily.  03/10/18   [provider]  nitroGLYCERIN (NITROSTAT) 0.4 MG SL tablet Place 0.4 mg under the tongue every 5 (five) minutes as needed for chest pain.    [provider]  oxycodone (ROXICODONE) 30 MG immediate release tablet Take 30 mg by mouth every 8 (eight) hours as needed for pain.     [provider]  oxyCODONE-acetaminophen (PERCOCET) 7.5-325 MG tablet Take 1 tablet by mouth every 6 (six) hours as needed for severe pain. 05/06/18   Dagoberto Ligas, PA-C  SANTYL ointment Apply 1 application topically daily. 05/29/18   [name], MD  tiotropium (SPIRIVA) 18 MCG inhalation capsule Place 18 mcg into inhaler and inhale daily.  03/10/18   [provider]  vancomycin (VANCOCIN) 50 mg/mL oral solution Take 2.5 mLs (125 mg total) by mouth every 6 (six)  hours for 12 days. 06/19/18 07/01/18  Oswald Hillock, MD   Current Facility-Administered Medications  Medication Dose Route Frequency Provider Last Rate Last Dose  . 0.9 %  sodium chloride infusion (Manually program via Guardrails IV Fluids)   Intravenous Once Elam Dutch, MD      . acetaminophen (TYLENOL) tablet 325-650 mg  325-650 mg Oral Q4H PRN Rhyne, Samantha J, PA-C       Or  . acetaminophen (TYLENOL) suppository 325-650 mg  325-650 mg Rectal Q4H PRN Rhyne, Hulen Shouts, PA-C      . amiodarone (PACERONE) tablet 200 mg  200 mg Oral BID Rhyne, Samantha J, PA-C   200 mg at 06/26/18 0956  . aspirin EC tablet 81 mg  81 mg Oral Daily Gabriel Earing, PA-C   81 mg at 06/26/18 0957  . atorvastatin (LIPITOR) tablet 40 mg  40 mg Oral BID Gabriel Earing, PA-C   40 mg at 06/26/18 0957  . bisacodyl (DULCOLAX) suppository 10 mg  10 mg Rectal Daily PRN Rhyne, Samantha J, PA-C      . [START ON 06/27/2018] calcitRIOL (ROCALTROL) capsule 0.25 mcg  0.25 mcg Oral Q M,W,F-HD Rhyne, Samantha J, PA-C      . docusate sodium (COLACE) capsule 100 mg  100 mg Oral Daily Rhyne, Samantha J, PA-C   100 mg at 06/26/18 0956  . ferric citrate (AURYXIA) tablet 420 mg  420 mg Oral TID WC Rhyne, Samantha J, PA-C   420 mg at 06/26/18 1245  . FLUoxetine (PROZAC) capsule 20 mg  20 mg Oral Daily Rhyne, Samantha J, PA-C   20 mg at 06/26/18 0956  . furosemide (LASIX) tablet 40 mg  40 mg Oral Q T,Th,S,Su Rhyne, Samantha J, PA-C   40 mg at 06/26/18 0957  . gabapentin (NEURONTIN) capsule 300 mg  300 mg Oral TID [place] Earing, PA-C   300 mg at 06/26/18 0956  . guaiFENesin-dextromethorphan (ROBITUSSIN DM) 100-10 MG/5ML syrup 15 mL  15 mL Oral Q4H PRN Rhyne, Samantha J, PA-C      . hydrALAZINE (APRESOLINE) injection 5 mg  5 mg Intravenous Q20 Min PRN Rhyne, Samantha J, PA-C      . HYDROmorphone (DILAUDID) injection 0.5 mg  0.5 mg Intravenous Q3H PRN Rhyne, Hulen Shouts, PA-C      .  insulin aspart (novoLOG) injection 0-9 Units  0-9  Units Subcutaneous TID WC Rhyne, Samantha J, PA-C      . insulin glargine (LANTUS) injection 8 Units  8 Units Subcutaneous QHS PRN Rhyne, Samantha J, PA-C      . labetalol (NORMODYNE,TRANDATE) injection 10 mg  10 mg Intravenous Q10 min PRN Rhyne, Samantha J, PA-C      . levalbuterol (XOPENEX) nebulizer solution 0.63 mg  0.63 mg Inhalation BID Rhyne, Samantha J, PA-C   0.63 mg at 06/26/18 0759  . magnesium sulfate IVPB 2 g 50 mL  2 g Intravenous Daily PRN Rhyne, Samantha J, PA-C      . metoprolol tartrate (LOPRESSOR) injection 2-5 mg  2-5 mg Intravenous Q2H PRN Rhyne, Samantha J, PA-C      . metoprolol tartrate (LOPRESSOR) tablet 12.5 mg  12.5 mg Oral BID Rhyne, Samantha J, PA-C   12.5 mg at 06/26/18 0956  . multivitamin (RENA-VIT) tablet 1 tablet  1 tablet Oral QHS [name], MD   1 tablet at 06/26/18 0009  . nitroGLYCERIN (NITROSTAT) SL tablet 0.4 mg  0.4 mg Sublingual Q5 min PRN Rhyne, Samantha J, PA-C      . ondansetron (ZOFRAN) injection 4 mg  4 mg Intravenous Q6H PRN Rhyne, Samantha J, PA-C      . oxyCODONE (Oxy IR/ROXICODONE) immediate release tablet 30 mg  30 mg Oral Q8H PRN Rhyne, Samantha J, PA-C      . oxyCODONE-acetaminophen (PERCOCET) 7.5-325 MG per tablet 1 tablet  1 tablet Oral Q6H PRN Rhyne, Hulen Shouts, PA-C   1 tablet at 06/26/18 1411  . pantoprazole (PROTONIX) EC tablet 40 mg  40 mg Oral Daily Rhyne, Hulen Shouts, PA-C   40 mg at 06/26/18 0956  . phenol (CHLORASEPTIC) mouth spray 1 spray  1 spray Mouth/Throat PRN Rhyne, Samantha J, PA-C      . polyethylene glycol (MIRALAX / GLYCOLAX) packet 17 g  17 g Oral Daily PRN Rhyne, Samantha J, PA-C      . vancomycin (VANCOCIN) 50 mg/mL oral solution 125 mg  125 mg Oral Q6H Early, Arvilla Meres, MD   125 mg at 06/26/18 1246   Facility-Administered Medications Ordered in Other Encounters  Medication Dose Route Frequency Provider Last Rate Last Dose  . 0.9 %  sodium chloride infusion    Continuous PRN Alain Marion, CRNA      . albumin human 5 %  solution    Continuous PRN Alain Marion, CRNA   Stopped at 06/23/2018 1815  . ePHEDrine sulfate (PF) 50mg  in normal saline 10 mL (5mg /mL) syringe   Intravenous Anesthesia Intra-op Alain Marion, CRNA   10 mg at 06/24/2018 1805  . etomidate (AMIDATE) injection    Anesthesia Intra-op Alain Marion, CRNA   16 mg at 06/03/2018 1552  . fentaNYL (SUBLIMAZE) injection   Intravenous Anesthesia Intra-op Alain Marion, CRNA   50 mcg at 06/12/2018 1735  . heparin injection    Anesthesia Intra-op Alain Marion, CRNA   5,000 Units at 06/01/2018 1740  . lidocaine 2% (20 mg/mL) 5 mL syringe   Intravenous Anesthesia Intra-op Alain Marion, CRNA   60 mg at 06/08/2018 1552  . ondansetron (ZOFRAN) injection   Intravenous Anesthesia Intra-op Alain Marion, CRNA   4 mg at 06/11/2018 1810  . phenylephrine (NEO-SYNEPHRINE) 0.04 mg/mL in sodium chloride 0.9 % 250 mL infusion    Continuous PRN Alain Marion, CRNA   Stopped at 05/27/2018 1829  .  PHENYLephrine 40 mcg/ml in normal saline Adult IV Push Syringe   Intravenous Anesthesia Intra-op Alain Marion, CRNA   80 mcg at 06/15/2018 1748  . protamine injection    Anesthesia Intra-op Alain Marion, CRNA   50 mg at 06/19/2018 1804  . succinylcholine (ANECTINE) syringe   Intravenous Anesthesia Intra-op Alain Marion, CRNA   100 mg at 06/18/2018 1552   Labs: Basic Metabolic Panel: Recent Labs  Lab 06/20/18 0830 06/19/2018 1525 06/26/18 0259  NA 127* 134* 136  K 5.0 4.0 3.9  CL 90* 97* 100  CO2 26 29 28   GLUCOSE 130* 136* 89  BUN 45* 18 21*  CREATININE 5.61* 3.35* 3.80*  CALCIUM 8.8* 7.9* 7.7*  PHOS 2.6  --   --    Liver Function Tests: Recent Labs  Lab 06/20/18 0830  ALBUMIN 2.1*   CBC: Recent Labs  Lab 06/20/18 0830 06/24/2018 1525 06/26/18 0259 06/26/18 0954  WBC 8.5 11.5* 10.5 13.5*  HGB 9.5* 8.2* 6.4* 6.9*  HCT 31.7* 28.0* 20.0* 21.4*  MCV 83.6 83.8 82.3 84.9  PLT 219 284 167 180   CBG: Recent Labs  Lab 06/20/18 0634 06/20/18 1240 06/05/2018 1838  06/26/18 0620 06/26/18 1128  GLUCAP 104* 93 114* 84 121*    ROS: As per HPI otherwise negative. Physical Exam: Vitals:   06/26/18 0801 06/26/18 0809 06/26/18 0956 06/26/18 1126  BP:  (!) 122/57 (!) 107/56 (!) 117/59  Pulse:  72 75 75  Resp:  19  19  Temp:  98.1 F (36.7 C)  97.7 F (36.5 C)  TempSrc:  Oral  Oral  SpO2: 97% 99%  94%  Weight:      Height:         General:  NAD chronically ill appearing Head: NCAT sclera not icteric MMM Neck: Supple.  Lungs: CTA bilaterally without wheezes, rales, or rhonchi. Breathing is unlabored. Heart: RRR with S1 S2.  Abdomen: soft NT + BS Lower extremities: RLE no open wounds no edema - left leg groin incision in tact - some thigh and LLE edema heel dark eschar, great toe dry ulcer Neuro: A & O  X 3. Moves all extremities spontaneously. Psych:  Responds to questions appropriately with a normal affect. Dialysis Access: right lower AVF + bruit  Right IJ TDC   Dialysis Orders:  [facility] MWF  4h 350/1.5 78kg 2/2.25 bath P2 R AVF/ TDC still in Hep none Venofer 100 mg IV x 2 no ESA Calcitriol 0.5 mcg PO TIW  Assessment/Plan: 1. PAD / sp repair of left groin wound by VVS 3/30- patient received a last dose of Vanc 1 mg 3/30 at dialysis and again was given 1.5 gm 3/30 after arrival here - will draw a Vanc level pre HD Wednesday 2. ESRD -  MWF - have been using AVF - if no problems while here, consider removal - TDC [facility] .  HD first round 3. Hypertension/volume  - gets to edw with good  BP control, some left leg edema today. 4. ABLA   - s/p 2 units PRBC hgb up to 6.9- resume ESA/ likely will need more - check with pre HD labs and transfuse on dialysis prn 5. Metabolic bone disease -  Calcitriol/binders 6.  Nutrition - renal diet/vits/add suppelments alb low 7. COPD - home O2 8. Chr systolic CHF EF 94-70% EF  9. Chronic afib - on amio/BB and elliquis previously 10. Neuropathy - reduced neurontin to 100 tid  (ESRD appropriate  dose) from 300 tid  11. MRSA contact precautions 12. DM -per primary 13. Left heel ulcer - follow   Myriam Jacobson, PA-C [place] [phone] 06/26/2018, 2:28 PM   Pt seen, examined and agree w A/P as above.  [facility] 06/26/2018, 5:47 PM

## 2018-06-26 NOTE — Evaluation (Signed)
Occupational Therapy Evaluation Patient Details Name: Jose Sellers MRN: [id number] DOB: 07/16/1960 Today's Date: 06/26/2018    History of Present Illness Patient is 58 y/o male with PMH significant for CAD, CHF, COPD, ESRD on HD MWF, HTN, DMII, AICD, PAD, afib. Pt s/p left fem pop bypass and subsequent infection.  He had graft removal and vein patch of left femoral artery by Dr Donnetta Hutching 06/16/2018.  He developed bleeding from the left groin and was transferred from [facility] ER. Pt underwent patch angioplasty L common fem artery on 06/05/2018.   Clinical Impression   PTA patient reports independent with mobility, some assist with ADLs from spouse as needed.  Admitted for above and limited by problem list below, including pain in L LE, impaired balance, decreased functional reach to LEs, generalized weakness. Patient currently requires setup assist seated for grooming and UB ADLs, mod assist for LB ADLs and min guard for basic transfers.  Patient will benefit from continued OT services while admitted in order to optimize independence and safety with ADLs/mobility but anticipate he will progress quickly and will not need OT needs after dc.     Follow Up Recommendations  No OT follow up    Equipment Recommendations  None recommended by OT    Recommendations for Other Services       Precautions / Restrictions Precautions Precautions: Fall Restrictions Weight Bearing Restrictions: No      Mobility Bed Mobility Overal bed mobility: Needs Assistance Bed Mobility: Sit to Supine       Sit to supine: Min assist   General bed mobility comments: EOB upon entry, LB assist to supine   Transfers Overall transfer level: Needs assistance Equipment used: Rolling walker (2 wheeled) Transfers: Sit to/from Stand Sit to Stand: Min guard         General transfer comment: Hands-on guarding for balance support and safety as pt powered-up to full stand with cueing for hand placement and safety      Balance Overall balance assessment: Needs assistance Sitting-balance support: Feet supported;No upper extremity supported Sitting balance-Leahy Scale: Good     Standing balance support: Bilateral upper extremity supported Standing balance-Leahy Scale: Poor Standing balance comment: Reliant on UE support at this time.                            ADL either performed or assessed with clinical judgement   ADL Overall ADL's : Needs assistance/impaired     Grooming: Wash/dry hands;Oral care;Set up;Sitting   Upper Body Bathing: Set up;Sitting   Lower Body Bathing: Sit to/from stand;Minimal assistance   Upper Body Dressing : Set up;Sitting   Lower Body Dressing: Moderate assistance;Sit to/from stand   Toilet Transfer: Min guard;Ambulation;RW Toilet Transfer Details (indicate cue type and reason): simulated in room Toileting- Clothing Manipulation and Hygiene: Min guard;Sit to/from stand       Functional mobility during ADLs: Min guard;Rolling walker       Vision Patient Visual Report: No change from baseline       Perception     Praxis      Pertinent Vitals/Pain Pain Assessment: Faces Faces Pain Scale: Hurts even more Pain Location: LLE inner thigh Pain Descriptors / Indicators: Discomfort;Guarding;Burning Pain Intervention(s): Monitored during session;Repositioned     Hand Dominance Right   Extremity/Trunk Assessment Upper Extremity Assessment Upper Extremity Assessment: Generalized weakness   Lower Extremity Assessment Lower Extremity Assessment: Defer to PT evaluation LLE Deficits / Details: Bloody drainage noted  from groin site. RN present and to assess when PT exits room. Pt with pain and decreased strength/AROM consistent with above mentioned procedures.    Cervical / Trunk Assessment Cervical / Trunk Assessment: Normal   Communication Communication Communication: No difficulties   Cognition Arousal/Alertness: Awake/alert Behavior  During Therapy: WFL for tasks assessed/performed Overall Cognitive Status: Within Functional Limits for tasks assessed                                     General Comments       Exercises     Shoulder Instructions      Home Living Family/patient expects to be discharged to:: Private residence Living Arrangements: Spouse/significant other Available Help at Discharge: Family;Available 24 hours/day Type of Home: House Home Access: Stairs to enter [employer] of Steps: 10 Entrance Stairs-Rails: Left;Right;Can reach both Home Layout: One level     Bathroom Shower/Tub: Occupational psychologist: Handicapped height     Home Equipment: Environmental consultant - 2 wheels;Cane - single point;Grab bars - tub/shower;Shower seat          Prior Functioning/Environment Level of Independence: Independent with assistive device(s)        Comments: Pt reports he was managing fairly well at home before readmission. Wife occasionally assisting him with ADL's but he was mostly completing independently.         OT Problem List: Impaired balance (sitting and/or standing);Pain;Decreased knowledge of use of DME or AE;Decreased activity tolerance      OT Treatment/Interventions: Self-care/ADL training;DME and/or AE instruction;Therapeutic activities;Patient/family education;Balance training    OT Goals(Current goals can be found in the care plan section) Acute Rehab OT Goals Patient Stated Goal: Decrease pain OT Goal Formulation: With patient Time For Goal Achievement: 07/10/18 Potential to Achieve Goals: Good  OT Frequency: Min 2X/week   Barriers to D/C:            Co-evaluation              AM-PAC OT "6 Clicks" Daily Activity     Outcome Measure Help from another person eating meals?: None Help from another person taking care of personal grooming?: A Little Help from another person toileting, which includes using toliet, bedpan, or urinal?: A Little Help  from another person bathing (including washing, rinsing, drying)?: A Little Help from another person to put on and taking off regular upper body clothing?: None Help from another person to put on and taking off regular lower body clothing?: A Lot 6 Click Score: 19   End of Session Equipment Utilized During Treatment: Rolling walker;Gait belt Nurse Communication: Mobility status  Activity Tolerance: Patient tolerated treatment well Patient left: in bed;with call bell/phone within reach;with bed alarm set  OT Visit Diagnosis: Unsteadiness on feet (R26.81);Other abnormalities of gait and mobility (R26.89);Pain;History of falling (Z91.81);Muscle weakness (generalized) (M62.81) Pain - Right/Left: Left Pain - part of body: Leg                Time: 6948-5462 OT Time Calculation (min): 25 min Charges:  OT General Charges $OT Visit: 1 Visit OT Evaluation $OT Eval Moderate Complexity: [place], OT Acute Rehabilitation Services Pager [phone] Office [phone]   Delight Stare 06/26/2018, 3:06 PM

## 2018-06-26 NOTE — Plan of Care (Signed)
  Problem: Education: Goal: Knowledge of General Education information will improve Description Including pain rating scale, medication(s)/side effects and non-pharmacologic comfort measures Outcome: Progressing   Problem: Clinical Measurements: Goal: Will remain free from infection Outcome: Progressing Goal: Respiratory complications will improve Outcome: Progressing   Problem: Nutrition: Goal: Adequate nutrition will be maintained Outcome: Progressing   Problem: Health Behavior/Discharge Planning: Goal: Ability to manage health-related needs will improve Outcome: Not Progressing

## 2018-06-26 NOTE — TOC Initial Note (Signed)
Transition of Care (TOC) - Initial/Assessment Note  Marvetta Gibbons RN, BSN Transitions of Care Unit 4E- RN Case Manager [phone]  Patient Details  Name: Jose Sellers MRN: [id number] Date of Birth: 10-03-1960  Transition of Care [facility]) CM/SW Contact:    Dawayne Patricia, RN Phone Number: [phone] 06/26/2018, 10:28 AM  Clinical Narrative:                 Pt presented with left groin hemorrhage- hx left fempop with subsequent infection s/p Patch angioplasty left common femoral artery, pt with wet-dry drsg changes (no wound VAC at this time) Pt was active with [facility] for [facility] needs prior to admission [facility] order has been placed to resume for wound care needs.   Expected Discharge Plan: [place] Barriers to Discharge: No Barriers Identified   Patient Goals and CMS Choice Patient states their goals for this hospitalization and ongoing recovery are:: "to get back home" CMS Medicare.gov Compare Post Acute Care list provided to:: Patient Choice offered to / list presented to : Patient  Expected Discharge Plan and Services Expected Discharge Plan: [place]   Discharge Planning Services: CM Consult Post Acute Care Choice: Home Health, Resumption of Svcs/PTA Provider Living arrangements for the past 2 months: Single Family Home                     HH Arranged: RN [place] Agency: [place] [facility]  Prior Living Arrangements/Services Living arrangements for the past 2 months: Single Family Home Lives with:: Spouse Patient language and need for interpreter reviewed:: No Do you feel safe going back to the place where you live?: Yes      Need for Family Participation in Patient Care: Yes (Comment) Care giver support system in place?: Yes (comment) Current home services: Home RN Criminal Activity/Legal Involvement Pertinent to Current Situation/Hospitalization: No - Comment as needed  Activities of Daily Living Home Assistive  Devices/Equipment: CBG Meter, Cane (specify quad or straight), Walker (specify type) ADL Screening (condition at time of admission) Patient's cognitive ability adequate to safely complete daily activities?: Yes Is the patient deaf or have difficulty hearing?: No Does the patient have difficulty seeing, even when wearing glasses/contacts?: No Does the patient have difficulty concentrating, remembering, or making decisions?: No Patient able to express need for assistance with ADLs?: Yes Does the patient have difficulty dressing or bathing?: No Independently performs ADLs?: Yes (appropriate for developmental age) Does the patient have difficulty walking or climbing stairs?: Yes Weakness of Legs: Both Weakness of Arms/Hands: Both  Permission Sought/Granted Permission sought to share information with : Case Manager, Family Supports Permission granted to share information with : Yes, Verbal Permission Granted  Share Information with NAME: Prather Failla  Permission granted to share info w AGENCY: [place] agency  Permission granted to share info w Relationship: spouse  Permission granted to share info w Contact Information: [phone]  Emotional Assessment Appearance:: Appears older than stated age Attitude/Demeanor/Rapport: Engaged Affect (typically observed): Accepting, Appropriate Orientation: : Oriented to Self, Oriented to Situation, Oriented to Place, Oriented to  Time Alcohol / Substance Use: Tobacco Use, Alcohol Use Psych Involvement: No (comment)  Admission diagnosis:  Bleeding from wound [T14.8XXA] Patient Active Problem List   Diagnosis Date Noted  . Bleeding 06/26/2018  . Left groin pain 06/12/2018  . Open wound of left thigh 06/11/2018  . Chronic atrial fibrillation 06/11/2018  . Cellulitis of left groin 06/11/2018  . Thrombocytopenia ([place]) 06/11/2018  .  Wound infection 06/11/2018  . PAD (peripheral artery disease) ([place]) 05/04/2018  . AICD (automatic  cardioverter/defibrillator) present   . Typical atrial flutter ([place]) 10/16/2017  . Acute respiratory failure with hypoxia ([place]) 09/08/2017  . C. difficile diarrhea 09/08/2017  . Peritonitis associated with peritoneal dialysis ([place]) 08/16/2017  . At high risk for falls 07/27/2017  . Arteriosclerotic vascular disease 03/16/2017  . Chronic heart disease 03/16/2017  . ESRD on dialysis ([place]) 03/16/2017  . Hypercholesterolemia 03/16/2017  . Hypertensive disorder 03/16/2017  . Anemia of unknown etiology 07/07/2016  . Malaise and fatigue 07/07/2016  . Chronic pain syndrome 06/20/2016  . Secondary hyperparathyroidism ([place]) 12/16/2015  . Stage 5 chronic kidney disease ([place]) 12/16/2015  . Chronic systolic congestive heart failure ([place]) 11/25/2015  . Tobacco use 11/25/2015  . Ventral hernia without obstruction or gangrene 10/26/2015  . Psychosexual dysfunction with inhibited sexual excitement 08/27/2015  . Anxiety 08/26/2015  . COPD (chronic obstructive pulmonary disease) ([place]) 08/26/2015  . DDD (degenerative disc disease), lumbosacral 08/26/2015  . GERD (gastroesophageal reflux disease) 08/26/2015  . Hypothyroidism 08/26/2015  . Microalbuminuria 08/26/2015  . Mixed hyperlipidemia 08/26/2015  . Osteoarthritis of multiple joints 08/26/2015  . Peripheral vascular disease ([place]) 08/26/2015  . Abdominal hernia 06/16/2015  . CAD (coronary artery disease) 04/07/2015  . 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 OSA (obstructive sleep apnea) 04/07/2015  . Essential hypertension 03/06/2015  . Retinopathy, diabetic, bilateral ([place]) 03/06/2015  . Uncontrolled type 2 diabetes mellitus with diabetic polyneuropathy, with long-term current use of insulin ([place]) 03/06/2015  . Atherosclerosis with claudication of extremity ([place]) 08/01/2014  . Mild episode of recurrent major depressive disorder ([place]) 05/01/2013  . Panic disorder 05/01/2013   PCP:  Raina Mina., MD Pharmacy:    [place], [place] [address] Phone: [phone] Fax: [phone]     Social Determinants of Health (SDOH) Interventions    Readmission Risk Interventions Readmission Risk Prevention Plan 06/20/2018 06/19/2018  Transportation Screening - Complete  PCP or Specialist Appt within 3-5 Days - Complete  HRI or [place] - Complete  Social Work Consult for [place] Planning/Counseling - Complete  Palliative Care Screening - Not Applicable  Medication Review Press photographer) - Complete  PCP or Specialist appointment within 3-5 days of discharge Complete -  [place] or Home Care Consult Complete -  SW Recovery Care/Counseling Consult Complete -  Palliative Care Screening Not Applicable -  [place] Not Applicable -  Some recent data might be hidden

## 2018-06-26 NOTE — Progress Notes (Signed)
Received patient from PACU with one unit of blood running. Called blood bank to ask about 2nd unit and was told that an H&H needed to be collected before 2nd unit could be given. Per new protocol, only one unit will be given if unless the HGB <7.  CBC was not scheduled until 0500. Rescheduled CBC for 0230 to see if the  second unit RBC could be given, depending on the results of course. Will continue to monitor

## 2018-06-26 NOTE — Progress Notes (Signed)
Inpatient Diabetes Program Recommendations  AACE/ADA: New Consensus Statement on Inpatient Glycemic Control (2015)  Target Ranges:  Prepandial:   less than 140 mg/dL      Peak postprandial:   less than 180 mg/dL (1-2 hours)      Critically ill patients:  140 - 180 mg/dL   Lab Results  Component Value Date   GLUCAP 84 06/26/2018   HGBA1C 6.0 (H) 05/01/2018    Review of Glycemic Control Results for MARQUES, ERICSON (MRN [id number]) as of 06/26/2018 09:23  Ref. Range 05/31/2018 18:38 06/26/2018 06:20  Glucose-Capillary Latest Ref Range: 70 - 99 mg/dL 114 (H) 84   Diabetes history: DM2 Outpatient Diabetes medications: Lantus 8-12 units daily + Humalog 0-32 Current orders for Inpatient glycemic control: Lantus 8 units + Novolog sensitive tid  Inpatient Diabetes Program Recommendations:   Hole Lantus for now and review CBGs today Continue Novolog sensitive correction scale only.  Thank you, Nani Gasser. Laprecious Austill, RN, MSN, CDE  Diabetes Coordinator Inpatient Glycemic Control Team Team Pager [phone] (8am-5pm) 06/26/2018 9:33 AM

## 2018-06-26 NOTE — Anesthesia Postprocedure Evaluation (Signed)
Anesthesia Post Note  Patient: Jose Sellers  Procedure(s) Performed: LEFT GROIN EXPLORATION (Left Groin) PATCH ANGIOPLAST OF LEFT FEMORAL ARTERY (Left )     Patient location during evaluation: PACU Anesthesia Type: General Level of consciousness: awake and alert Pain management: pain level controlled Vital Signs Assessment: post-procedure vital signs reviewed and stable Respiratory status: spontaneous breathing, nonlabored ventilation, respiratory function stable and patient connected to nasal cannula oxygen Cardiovascular status: blood pressure returned to baseline and stable Postop Assessment: no apparent nausea or vomiting Anesthetic complications: no    Last Vitals:  Vitals:   06/26/18 0801 06/26/18 0809  BP:  (!) 122/57  Pulse:  72  Resp:  19  Temp:  36.7 C  SpO2: 97% 99%    Last Pain:  Vitals:   06/26/18 0809  TempSrc: Oral  PainSc:                  Jose Sellers S

## 2018-06-26 NOTE — Evaluation (Signed)
Physical Therapy Evaluation Patient Details Name: Jose Sellers MRN: [id number] DOB: Aug 19, 1960 Today's Date: 06/26/2018   History of Present Illness  Patient is 58 y/o male with PMH significant for CAD, CHF, COPD, ESRD on HD MWF, HTN, DMII, AICD, PAD, afib. Pt s/p left fem pop bypass and subsequent infection.  He had graft removal and vein patch of left femoral artery by Dr Donnetta Hutching 06/16/2018.  He developed bleeding from the left groin and was transferred from [facility] ER. Pt underwent patch angioplasty L common fem artery on 06/06/2018.  Clinical Impression  Pt admitted with above diagnosis. Pt currently with functional limitations due to the deficits listed below (see PT Problem List). At the time of PT eval pt was able to perform transfers and ambulation with gross min guard assist to min assist for balance support, safety, and LLE management (sit>supine). Pt reports he would like to transition back home with [facility] therapy services to follow up. Noted bloody drainage during gait training - RN notified and to redress incision area after session. Acutely, pt will benefit from skilled PT to increase their independence and safety with mobility to allow discharge to the venue listed below.       Follow Up Recommendations Home health PT;Supervision/Assistance - 24 hour    Equipment Recommendations  None recommended by PT    Recommendations for Other Services       Precautions / Restrictions Precautions Precautions: Fall Restrictions Weight Bearing Restrictions: No      Mobility  Bed Mobility Overal bed mobility: Needs Assistance Bed Mobility: Sit to Supine       Sit to supine: Min assist   General bed mobility comments: Pt was received sitting EOB finishing lunch when PT arrived. Assist to elevate LE's up into bed at end of session.   Transfers Overall transfer level: Needs assistance Equipment used: Rolling walker (2 wheeled) Transfers: Sit to/from Stand Sit to Stand: Min guard          General transfer comment: Hands-on guarding for balance support and safety as pt powered-up to full stand.   Ambulation/Gait Ambulation/Gait assistance: Min guard Gait Distance (Feet): 60 Feet Assistive device: Rolling walker (2 wheeled) Gait Pattern/deviations: Decreased stride length;Decreased weight shift to left;Antalgic;Step-through pattern;Trunk flexed Gait velocity: decreased Gait velocity interpretation: <1.8 ft/sec, indicate of risk for recurrent falls General Gait Details: Hands-on assist as pt ambulated in hall. Pt initially taking very small steps but was able to make corrective changes with cues. Frequent VC's for improved posture and forward gaze.   Stairs            Wheelchair Mobility    Modified Rankin (Stroke Patients Only)       Balance Overall balance assessment: Needs assistance Sitting-balance support: Feet supported;No upper extremity supported Sitting balance-Leahy Scale: Good     Standing balance support: Bilateral upper extremity supported Standing balance-Leahy Scale: Poor Standing balance comment: Reliant on UE support at this time.                              Pertinent Vitals/Pain Pain Assessment: Faces Faces Pain Scale: Hurts even more Pain Location: LLE inner thigh Pain Descriptors / Indicators: Discomfort;Guarding;Burning Pain Intervention(s): Monitored during session    Home Living Family/patient expects to be discharged to:: Private residence Living Arrangements: Spouse/significant other Available Help at Discharge: Family;Available 24 hours/day Type of Home: House Home Access: Stairs to enter Entrance Stairs-Rails: Left;Right;Can reach both [employer] of  Steps: 10 Home Layout: One level Home Equipment: [place] - 2 wheels;Cane - single point;Grab bars - tub/shower;Shower seat      Prior Function Level of Independence: Independent with assistive device(s)         Comments: Pt reports he  was managing fairly well at home before readmission. Wife occasionally assisting him with ADL's but he was mostly completing independently.      Hand Dominance   Dominant Hand: Right    Extremity/Trunk Assessment   Upper Extremity Assessment Upper Extremity Assessment: Defer to OT evaluation    Lower Extremity Assessment Lower Extremity Assessment: LLE deficits/detail LLE Deficits / Details: Bloody drainage noted from groin site. RN present and to assess when PT exits room. Pt with pain and decreased strength/AROM consistent with above mentioned procedures.     Cervical / Trunk Assessment Cervical / Trunk Assessment: Normal  Communication   Communication: No difficulties  Cognition Arousal/Alertness: Awake/alert Behavior During Therapy: WFL for tasks assessed/performed Overall Cognitive Status: Within Functional Limits for tasks assessed                                        General Comments      Exercises     Assessment/Plan    PT Assessment Patient needs continued PT services  PT Problem List Decreased strength;Decreased range of motion;Decreased activity tolerance;Decreased balance;Decreased mobility;Decreased knowledge of use of DME       PT Treatment Interventions DME instruction;Gait training;Stair training;Functional mobility training;Therapeutic activities;Therapeutic exercise;Balance training;Patient/family education    PT Goals (Current goals can be found in the Care Plan section)  Acute Rehab PT Goals Patient Stated Goal: Decrease pain PT Goal Formulation: With patient/family Time For Goal Achievement: 07/03/18 Potential to Achieve Goals: Good    Frequency Min 3X/week   Barriers to discharge        Co-evaluation               AM-PAC PT "6 Clicks" Mobility  Outcome Measure Help needed turning from your back to your side while in a flat bed without using bedrails?: None Help needed moving from lying on your back to sitting  on the side of a flat bed without using bedrails?: None Help needed moving to and from a bed to a chair (including a wheelchair)?: A Little Help needed standing up from a chair using your arms (e.g., wheelchair or bedside chair)?: A Little Help needed to walk in hospital room?: A Little Help needed climbing 3-5 steps with a railing? : A Lot 6 Click Score: 19    End of Session Equipment Utilized During Treatment: Gait belt Activity Tolerance: Patient tolerated treatment well Patient left: in bed;with call bell/phone within reach;Other (comment)(OT present for ADL's) Nurse Communication: Mobility status PT Visit Diagnosis: Other abnormalities of gait and mobility (R26.89);Muscle weakness (generalized) (M62.81);Difficulty in walking, not elsewhere classified (R26.2);History of falling (Z91.81)    Time: 4562-5638 PT Time Calculation (min) (ACUTE ONLY): 32 min   Charges:   PT Evaluation $PT Eval Moderate Complexity: 1 Mod PT Treatments $Gait Training: 8-22 mins        Rolinda Roan, PT, DPT Acute Rehabilitation Services Pager: [phone] Office: [phone]   Thelma Comp 06/26/2018, 2:54 PM

## 2018-06-26 NOTE — Progress Notes (Signed)
Critical HGB 6.4 post transfusion of first unit of blood. Patient receiving 2nd unit started at 0509.

## 2018-06-26 NOTE — Progress Notes (Signed)
OT Cancellation Note  Patient Details Name: Jose Sellers MRN: [id number] DOB: 06/14/1960   Cancelled Treatment:    Reason Eval/Treat Not Completed: Medical issues which prohibited therapy (HBG 6.4), noted receiving 2nd unit.  Will follow and initiate OT eval when appropriate and able.  Delight Stare, OT Acute Rehabilitation Services Pager [phone] Office [phone]    Delight Stare 06/26/2018, 7:22 AM

## 2018-06-26 NOTE — Plan of Care (Signed)
  Problem: Health Behavior/Discharge Planning: Goal: Ability to manage health-related needs will improve Outcome: Progressing   Problem: Clinical Measurements: Goal: Will remain free from infection Outcome: Progressing   Problem: Nutrition: Goal: Adequate nutrition will be maintained Outcome: Progressing

## 2018-06-26 NOTE — Progress Notes (Addendum)
[facility] [place]  Subjective  - No new complaints.  Doing fairly well post op.   Objective (!) 122/57 72 98.1 F (36.7 C) (Oral) 19 99%  Intake/Output Summary (Last 24 hours) at 06/26/2018 0914 Last data filed at 06/26/2018 0315 Gross per 24 hour  Intake 2262 ml  Output 300 ml  Net 1962 ml    Doppler left AT/peroneal and weak DP   Left groin and medial thigh dressing changed with wet to dry. GT DM ulcer dry left open to air. 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  History of left fem pop bypass and subsequent infection.  He had graft removal and vein patch of left femoral artery by Dr Early about 10 days ago.   Post op blood loss anemia receiving second post op unit of PRBC now.  CBC post transfusion pending.  Once Hemoglobin stable patient will be discharged possibly tomorrow.  I have ordered a [place] RN for dressing changes and wound checks.  He can keep his previous appointment with Dr. Donnetta Hutching.  Jose Sellers 06/26/2018 9:14 AM --  Laboratory Lab Results: Recent Labs    06/10/2018 1525 06/26/18 0259  WBC 11.5* 10.5  HGB 8.2* 6.4*  HCT 28.0* 20.0*  PLT 284 167   BMET Recent Labs    06/24/2018 1525 06/26/18 0259  NA 134* 136  K 4.0 3.9  CL 97* 100  CO2 29 28  GLUCOSE 136* 89  BUN 18 21*  CREATININE 3.35* 3.80*  CALCIUM 7.9* 7.7*    COAG Lab Results  Component Value Date   INR 1.5 (H) 06/12/2018   INR 1.23 05/01/2018   INR 1.08 02/13/2018   No results found for: PTT  I have independently interviewed and examined patient and agree with PA assessment and plan above.  Wound appears healthy with the skin open only and was packed at the bedside.  Given recent common femoral endarterectomy he is possibly a candidate for  endovascular treatment of his arterial disease once his wound heals.  Jose Sellers C. Donzetta Matters, MD [facility] [place] Office: [phone] Pager: [phone]

## 2018-06-27 ENCOUNTER — Encounter (HOSPITAL_COMMUNITY): Payer: Self-pay | Admitting: Vascular Surgery

## 2018-06-27 LAB — GLUCOSE, CAPILLARY
Glucose-Capillary: 138 mg/dL — ABNORMAL HIGH (ref 70–99)
Glucose-Capillary: 101 mg/dL — ABNORMAL HIGH (ref 70–99)
Glucose-Capillary: 151 mg/dL — ABNORMAL HIGH (ref 70–99)
Glucose-Capillary: 137 mg/dL — ABNORMAL HIGH (ref 70–99)

## 2018-06-27 LAB — CBC
HCT: 18.6 % — ABNORMAL LOW (ref 39.0–52.0)
Hemoglobin: 5.9 g/dL — CL (ref 13.0–17.0)
MCH: 27.4 pg (ref 26.0–34.0)
MCHC: 31.7 g/dL (ref 30.0–36.0)
MCV: 86.5 fL (ref 80.0–100.0)
Platelets: 171 10*3/uL (ref 150–400)
RBC: 2.15 MIL/uL — ABNORMAL LOW (ref 4.22–5.81)
RDW: 17.3 % — ABNORMAL HIGH (ref 11.5–15.5)
WBC: 7.6 10*3/uL (ref 4.0–10.5)
nRBC: 0 % (ref 0.0–0.2)

## 2018-06-27 LAB — VANCOMYCIN, RANDOM: Vancomycin Rm: 21

## 2018-06-27 LAB — PREPARE RBC (CROSSMATCH)

## 2018-06-27 LAB — RENAL FUNCTION PANEL
Albumin: 1.9 g/dL — ABNORMAL LOW (ref 3.5–5.0)
Anion gap: 12 (ref 5–15)
BUN: 27 mg/dL — ABNORMAL HIGH (ref 6–20)
CO2: 24 mmol/L (ref 22–32)
Calcium: 7.8 mg/dL — ABNORMAL LOW (ref 8.9–10.3)
Chloride: 94 mmol/L — ABNORMAL LOW (ref 98–111)
Creatinine, Ser: 4.42 mg/dL — ABNORMAL HIGH (ref 0.61–1.24)
GFR calc Af Amer: 16 mL/min — ABNORMAL LOW (ref >60)
GFR calc non Af Amer: 14 mL/min — ABNORMAL LOW (ref >60)
Glucose, Bld: 130 mg/dL — ABNORMAL HIGH (ref 70–99)
Phosphorus: 2.6 mg/dL (ref 2.5–4.6)
Potassium: 3.7 mmol/L (ref 3.5–5.1)
Sodium: 130 mmol/L — ABNORMAL LOW (ref 135–145)

## 2018-06-27 LAB — AEROBIC CULTURE W GRAM STAIN (SUPERFICIAL SPECIMEN)

## 2018-06-27 MED ORDER — CEFAZOLIN SODIUM-DEXTROSE 2-4 GM/100ML-% IV SOLN
2.0000 g | Freq: Once | INTRAVENOUS | Status: AC
  Administered 2018-06-27: 2 g via INTRAVENOUS
  Filled 2018-06-27: qty 100

## 2018-06-27 MED ORDER — VANCOMYCIN HCL 1000 MG IV SOLR
1000.0000 mg | INTRAVENOUS | Status: DC
  Administered 2018-06-29: 1000 mg via INTRAVENOUS
  Filled 2018-06-27 (×6): qty 1000

## 2018-06-27 MED ORDER — SODIUM CHLORIDE 0.9 % IV SOLN: 100.0000 mL | INTRAVENOUS | Status: DC | PRN

## 2018-06-27 MED ORDER — LIDOCAINE HCL (PF) 1 % IJ SOLN: 5.0000 mL | INTRAMUSCULAR | Status: DC | PRN

## 2018-06-27 MED ORDER — SODIUM CHLORIDE 0.9% IV SOLUTION: Freq: Once | INTRAVENOUS | Status: DC

## 2018-06-27 MED ORDER — HEPARIN SODIUM (PORCINE) 1000 UNIT/ML DIALYSIS
1000.0000 [IU] | INTRAMUSCULAR | Status: DC | PRN
  Filled 2018-06-27: qty 1

## 2018-06-27 MED ORDER — ALTEPLASE 2 MG IJ SOLR: 2.0000 mg | Freq: Once | INTRAMUSCULAR | Status: DC | PRN

## 2018-06-27 MED ORDER — CEFAZOLIN SODIUM-DEXTROSE 2-4 GM/100ML-% IV SOLN
2.0000 g | INTRAVENOUS | Status: DC
  Administered 2018-06-29 – 2018-07-02 (×2): 2 g via INTRAVENOUS
  Filled 2018-06-27 (×7): qty 100

## 2018-06-27 MED ORDER — DARBEPOETIN ALFA 100 MCG/0.5ML IJ SOSY
PREFILLED_SYRINGE | INTRAMUSCULAR | Status: AC
  Filled 2018-06-27: qty 0.5

## 2018-06-27 MED ORDER — VANCOMYCIN HCL 1000 MG IV SOLR
1000.0000 mg | Freq: Once | INTRAVENOUS | Status: AC
  Administered 2018-06-27: 1000 mg via INTRAVENOUS
  Filled 2018-06-27: qty 1000

## 2018-06-27 MED ORDER — LIDOCAINE-PRILOCAINE 2.5-2.5 % EX CREA
1.0000 "application " | TOPICAL_CREAM | CUTANEOUS | Status: DC | PRN
  Filled 2018-06-27: qty 5

## 2018-06-27 MED ORDER — PENTAFLUOROPROP-TETRAFLUOROETH EX AERO: 1.0000 "application " | INHALATION_SPRAY | CUTANEOUS | Status: DC | PRN

## 2018-06-27 MED ORDER — CALCITRIOL 0.5 MCG PO CAPS
ORAL_CAPSULE | ORAL | Status: AC
  Filled 2018-06-27: qty 1

## 2018-06-27 NOTE — Progress Notes (Signed)
Pt currently not in room for breathing tx.

## 2018-06-27 NOTE — Progress Notes (Signed)
Physical Therapy Treatment Patient Details Name: Jose Sellers MRN: [id number] DOB: 11-01-1960 Today's Date: 06/27/2018    History of Present Illness Patient is 58 y/o male with PMH significant for CAD, CHF, COPD, ESRD on HD MWF, HTN, DMII, AICD, PAD, afib. Pt s/p left fem pop bypass and subsequent infection.  He had graft removal and vein patch of left femoral artery by Dr Donnetta Hutching 06/16/2018.  He developed bleeding from the left groin and was transferred from [facility] ER. Pt underwent patch angioplasty L common fem artery on 06/19/2018.    PT Comments    Treatment limited by pt fatigue from HD session. Ambulating in room with walker and supervision. Will continue to progress gait and stair training as tolerated prior to discharge.  Follow Up Recommendations  Home health PT;Supervision/Assistance - 24 hour     Equipment Recommendations  None recommended by PT    Recommendations for Other Services       Precautions / Restrictions Precautions Precautions: Fall Restrictions Weight Bearing Restrictions: No    Mobility  Bed Mobility               General bed mobility comments: Sitting EOB on arrival  Transfers Overall transfer level: Needs assistance Equipment used: Rolling walker (2 wheeled) Transfers: Sit to/from Stand Sit to Stand: Supervision         General transfer comment: Supervision for safety  Ambulation/Gait Ambulation/Gait assistance: Supervision Gait Distance (Feet): 15 Feet Assistive device: Rolling walker (2 wheeled) Gait Pattern/deviations: Decreased stride length;Decreased weight shift to left;Antalgic;Step-through pattern;Trunk flexed Gait velocity: decreased   General Gait Details: Cues for upward gaze, continues with small steps   Stairs             Wheelchair Mobility    Modified Rankin (Stroke Patients Only)       Balance Overall balance assessment: Needs assistance Sitting-balance support: Feet supported;No upper extremity  supported Sitting balance-Leahy Scale: Good     Standing balance support: Bilateral upper extremity supported Standing balance-Leahy Scale: Poor Standing balance comment: Reliant on UE support at this time.                             Cognition Arousal/Alertness: Awake/alert Behavior During Therapy: WFL for tasks assessed/performed Overall Cognitive Status: Within Functional Limits for tasks assessed                                        Exercises      General Comments        Pertinent Vitals/Pain Pain Assessment: Faces Faces Pain Scale: Hurts even more Pain Location: LLE Pain Descriptors / Indicators: Discomfort;Guarding;Burning Pain Intervention(s): Monitored during session    Home Living                      Prior Function            PT Goals (current goals can now be found in the care plan section) Acute Rehab PT Goals Patient Stated Goal: Decrease pain PT Goal Formulation: With patient/family Time For Goal Achievement: 07/03/18 Potential to Achieve Goals: Good Progress towards PT goals: Progressing toward goals    Frequency    Min 3X/week      PT Plan Current plan remains appropriate    Co-evaluation  AM-PAC PT "6 Clicks" Mobility   Outcome Measure  Help needed turning from your back to your side while in a flat bed without using bedrails?: None Help needed moving from lying on your back to sitting on the side of a flat bed without using bedrails?: None Help needed moving to and from a bed to a chair (including a wheelchair)?: A Little Help needed standing up from a chair using your arms (e.g., wheelchair or bedside chair)?: A Little Help needed to walk in hospital room?: A Little Help needed climbing 3-5 steps with a railing? : A Lot 6 Click Score: 19    End of Session Equipment Utilized During Treatment: Gait belt Activity Tolerance: Patient limited by fatigue Patient left: with call  bell/phone within reach;in chair Nurse Communication: Mobility status PT Visit Diagnosis: Other abnormalities of gait and mobility (R26.89);Muscle weakness (generalized) (M62.81);Difficulty in walking, not elsewhere classified (R26.2);History of falling (Z91.81)     Time: 1205-1223 PT Time Calculation (min) (ACUTE ONLY): 18 min  Charges:  $Therapeutic Activity: 8-22 mins                    Ellamae Sia, PT, DPT Acute Rehabilitation Services Pager [phone] Office [phone]   Willy Eddy 06/27/2018, 2:08 PM

## 2018-06-27 NOTE — Transfer of Care (Signed)
Immediate Anesthesia Transfer of Care Note  Patient: Jose Sellers  Procedure(s) Performed: LEFT GROIN EXPLORATION (Left Groin) PATCH ANGIOPLAST OF LEFT FEMORAL ARTERY (Left )  Patient Location: PACU  Anesthesia Type:General  Level of Consciousness: awake, alert  and oriented  Airway & Oxygen Therapy: Patient Spontanous Breathing and Patient connected to face mask oxygen  Post-op Assessment: Report given to RN and Post -op Vital signs reviewed and stable  Post vital signs: Reviewed and stable  Last Vitals:  Vitals Value Taken Time  BP    Temp    Pulse    Resp    SpO2      Last Pain:  Vitals:   06/27/18 1222  TempSrc:   PainSc: 8          Complications: No apparent anesthesia complications

## 2018-06-27 NOTE — Progress Notes (Signed)
  Progress Note    06/27/2018 12:39 PM 2 Days Post-Op  Subjective: back from hd via tdc 2/2 pain in right arm  Vitals:   06/27/18 1103 06/27/18 1159  BP: 119/70 (!) 113/47  Pulse: 80 75  Resp: 16 17  Temp: 98.2 F (36.8 C) 98.1 F (36.7 C)  SpO2: 96% 93%    Physical Exam: aaox3 Thrill in right forearm but is tender to palpation Left groin and significant drainage but wound is intact in deep layers skin is open Above-knee wound appears healthy with some fibrinous exudate  CBC    Component Value Date/Time   WBC 7.6 06/27/2018 0730   RBC 2.15 (L) 06/27/2018 0730   HGB 5.9 (LL) 06/27/2018 0730   HCT 18.6 (L) 06/27/2018 0730   PLT 171 06/27/2018 0730   MCV 86.5 06/27/2018 0730   MCH 27.4 06/27/2018 0730   MCHC 31.7 06/27/2018 0730   RDW 17.3 (H) 06/27/2018 0730   LYMPHSABS 1.0 06/18/2018 0333   MONOABS 0.6 06/18/2018 0333   EOSABS 0.1 06/18/2018 0333   BASOSABS 0.0 06/18/2018 0333    BMET    Component Value Date/Time   NA 130 (L) 06/27/2018 0729   K 3.7 06/27/2018 0729   CL 94 (L) 06/27/2018 0729   CO2 24 06/27/2018 0729   GLUCOSE 130 (H) 06/27/2018 0729   BUN 27 (H) 06/27/2018 0729   CREATININE 4.42 (H) 06/27/2018 0729   CALCIUM 7.8 (L) 06/27/2018 0729   GFRNONAA 14 (L) 06/27/2018 0729   GFRAA 16 (L) 06/27/2018 0729    INR    Component Value Date/Time   INR 1.5 (H) 06/20/2018 1939     Intake/Output Summary (Last 24 hours) at 06/27/2018 1239 Last data filed at 06/27/2018 1103 Gross per 24 hour  Intake 240 ml  Output 3117 ml  Net -2877 ml     Assessment/plan:  58 y.o. male is s/p left common femoral endarterectomy and femoropopliteal bypass now this is excised with patch angioplasty of his left groin now with bovine pericardium and the above-knee remains with vein patch.  Wet-to-dry's.  Continue antibiotics for previous MRSA and Klebsiella on this admission.  Will likely need several weeks of antibiotics as an outpatient.  Transfusing as needed on  dialysis.  1 unit was transfused this morning.  Can possibly place wound VAC prior to discharge.    Mccall Will C. Donzetta Matters, MD [facility] Office: [phone] Pager: [phone]  06/27/2018 12:39 PM

## 2018-06-27 NOTE — Progress Notes (Signed)
[facility] Progress Note  Subjective: seen on HD, Hb down, no c/o's today  Vitals:   06/27/18 1030 06/27/18 1103 06/27/18 1159 06/27/18 1216  BP: 129/69 119/70 (!) 113/47   Pulse: 73 80 75   Resp:  16 17   Temp:  98.2 F (36.8 C) 98.1 F (36.7 C)   TempSrc:  Oral Oral   SpO2:  96% 93%   Weight:  79.1 kg 80.9 kg 78.8 kg  Height:        Inpatient medications: . sodium chloride   Intravenous Once  . sodium chloride   Intravenous Once  . amiodarone  200 mg Oral BID  . aspirin EC  81 mg Oral Daily  . atorvastatin  40 mg Oral BID  . calcitRIOL      . calcitRIOL  0.5 mcg Oral Q M,W,F-HD  . Chlorhexidine Gluconate Cloth  6 each Topical Q0600  . Darbepoetin Alfa      . darbepoetin (ARANESP) injection - DIALYSIS  100 mcg Intravenous Q Wed-HD  . docusate sodium  100 mg Oral Daily  . feeding supplement (PRO-STAT SUGAR FREE 64)  30 mL Oral BID  . ferric citrate  420 mg Oral TID WC  . FLUoxetine  20 mg Oral Daily  . furosemide  40 mg Oral Q T,Th,S,Su  . gabapentin  100 mg Oral TID  . insulin aspart  0-9 Units Subcutaneous TID WC  . levalbuterol  0.63 mg Inhalation BID  . metoprolol tartrate  12.5 mg Oral BID  . multivitamin  1 tablet Oral QHS  . pantoprazole  40 mg Oral Daily  . vancomycin  125 mg Oral Q6H   .  ceFAZolin (ANCEF) IV 2 g (06/27/18 1433)  . [START ON 06/29/2018]  ceFAZolin (ANCEF) IV    . magnesium sulfate 1 - 4 g bolus IVPB    . vancomycin    . [START ON 06/29/2018] vancomycin     acetaminophen **OR** acetaminophen, bisacodyl, guaiFENesin-dextromethorphan, hydrALAZINE, HYDROmorphone (DILAUDID) injection, insulin glargine, labetalol, magnesium sulfate 1 - 4 g bolus IVPB, metoprolol tartrate, nitroGLYCERIN, ondansetron, oxycodone, oxyCODONE-acetaminophen, phenol, polyethylene glycol  Iron/TIBC/Ferritin/ %Sat No results found for: IRON, TIBC, FERRITIN, IRONPCTSAT  Exam: General:  NAD chronically ill appearing Lungs: CTA bilaterally without wheezes,  rales, or rhonchi. Breathing is unlabored. Heart: RRR with S1 S2.  Abdomen: soft NT + BS Lower extremities: RLE no open wounds no edema - left leg groin incision in tact - some thigh and LLE edema heel dark eschar, great toe dry ulcer Neuro: A & O  X 3. nonfocal Dialysis Access: right lower AVF + bruit  Right IJ TDC    home meds:  - esomeprazole 40 qd/ ferric citrate ac  - aspirin 81/ atorvastatin 40 bid/ sl ntg prn  - amiodarone 200 bid/ metoprolol 12.5 bid  - fluoxetine 20 qd/ gabapentin 300 tid/ oxycodone IR prn  - insulin glargine 8 u pm/ insulin lispro SSI tid ac  - tiotropium 18 ug qd/ levalbuterol bid nebs  - prn's/ vitamins/ supplements  [facility] MWF  4h 350/1.5 78kg 2/2.25 bath P2 R AVF/ TDC still in Hep none Venofer 100 mg IV x 2 no ESA Calcitriol 0.5 mcg PO TIW  Assessment/Plan: 1. Bleeding L groin wound / PAD:  sp repair of left groin wound by VVS 3/30. Pt got OP Vanc 1 mg 3/30 at dialysis. Vanc level 21 today, pharm is aware.  2. ESRD -  MWF - have been using AVF, if no problems while  here, consider removal of TDC originally placed at [facility]. HD today. 3. Hypertension/volume  - gets to edw with good  BP control. At dry wt today.  4. ABLA - s/p 2 units PRBC, have ordered 1 more unit prbc give on HD today.  Resume ESA as needed.  5. Metabolic bone disease -  Calcitriol/binders 6.  Nutrition - renal diet/vits/add suppelments alb low 7. COPD - home O2 8. Chr systolic CHF EF 61-51% EF  9. Chronic afib - on amio/BB and elliquis previously 10. Neuropathy - reduced neurontin to 100 tid (ESRD appropriate dose) from 300 tid  11. MRSA contact precautions 12. DM -per primary 13. Left heel ulcer - follow   [facility] 06/27/2018, 3:01 PM  Recent Labs  Lab 06/24/2018 1939 06/26/18 0259 06/27/18 0729  NA  --  136 130*  K  --  3.9 3.7  CL  --  100 94*  CO2  --  28 24  GLUCOSE  --  89 130*  BUN  --  21* 27*  CREATININE  --  3.80* 4.42*   CALCIUM  --  7.7* 7.8*  PHOS  --   --  2.6  ALBUMIN  --   --  1.9*  INR 1.5*  --   --    No results for input(s): AST, ALT, ALKPHOS, BILITOT, PROT in the last 168 hours. Recent Labs  Lab 06/26/18 0954 06/27/18 0730  WBC 13.5* 7.6  HGB 6.9* 5.9*  HCT 21.4* 18.6*  MCV 84.9 86.5  PLT 180 171

## 2018-06-27 NOTE — Progress Notes (Signed)
OT Cancellation Note  Patient Details Name: Jose Sellers MRN: [id number] DOB: Oct 19, 1960   Cancelled Treatment:    Reason Eval/Treat Not Completed: Medical issues which prohibited therapy, hemoglobin 5.9.  Will follow.   Delight Stare, OT Acute Rehabilitation Services Pager [phone] Office [phone]    Delight Stare 06/27/2018, 12:38 PM

## 2018-06-27 NOTE — Progress Notes (Signed)
Pharmacy Antibiotic Note  Jose Sellers is a 58 y.o. male admitted on 06/02/2018 with groin wound infection (s/p left fem pop bypass and subsequent infection).  Pharmacy has been consulted for vancomycin dosing.  He is noted with ESRD with last HD this am.   He had a recent MRSA infection (infected bypass) and was on vancomycin with last dose on 06/24/17 at HD.  --pre HD vanc level= 21 on 4/1 -wound cultures this admission show klebsiella oxytoca (sens to ancef)  -Spoke with DrDonzetta Matters: plans are to continue vancomycin and will need coverage for klebsiella     Plan: vancomycin 1g IV QHD-MWF Ancef 2gmIV MWF with HD  Height: 6' (182.9 cm) Weight: 173 lb 11.6 oz (78.8 kg) IBW/kg (Calculated) : 77.6  Temp (24hrs), Avg:98.2 F (36.8 C), Min:97.5 F (36.4 C), Max:98.7 F (37.1 C)  Recent Labs  Lab 06/01/2018 1525 06/26/18 0259 06/26/18 0954 06/27/18 0729 06/27/18 0730  WBC 11.5* 10.5 13.5*  --  7.6  CREATININE 3.35* 3.80*  --  4.42*  --   VANCORANDOM  --   --   --  21  --     Estimated Creatinine Clearance: 20.5 mL/min (A) (by C-G formula based on SCr of 4.42 mg/dL (H)).    Allergies  Allergen Reactions  . Albuterol Palpitations    SVT  . Prednisone Shortness Of Breath    Wife stated that patient develops breathing issues.  . Fluticasone Furoate-Vilanterol Palpitations  . Morphine Rash    Thank you for allowing pharmacy to be a part of this patient's care.  Hildred Laser, PharmD Clinical Pharmacist **Pharmacist phone directory can now be found on [email or web address] ([email or web address]).  Listed under [facility].

## 2018-06-27 DEATH — deceased

## 2018-06-28 LAB — CBC
HCT: 22 % — ABNORMAL LOW (ref 39.0–52.0)
Hemoglobin: 7.1 g/dL — ABNORMAL LOW (ref 13.0–17.0)
MCH: 27.6 pg (ref 26.0–34.0)
MCHC: 32.3 g/dL (ref 30.0–36.0)
MCV: 85.6 fL (ref 80.0–100.0)
Platelets: 181 10*3/uL (ref 150–400)
RBC: 2.57 MIL/uL — ABNORMAL LOW (ref 4.22–5.81)
RDW: 17 % — ABNORMAL HIGH (ref 11.5–15.5)
WBC: 10.1 10*3/uL (ref 4.0–10.5)
nRBC: 0 % (ref 0.0–0.2)

## 2018-06-28 LAB — BPAM RBC
BLOOD PRODUCT EXPIRATION DATE: 202004172359
Blood Product Expiration Date: 202004192359
Blood Product Expiration Date: 202004192359
ISSUE DATE / TIME: 202003302110
ISSUE DATE / TIME: 202003310449
ISSUE DATE / TIME: 202004010941
Unit Type and Rh: 5100
Unit Type and Rh: 5100
Unit Type and Rh: 5100

## 2018-06-28 LAB — TYPE AND SCREEN
ABO/RH(D): O POS
Antibody Screen: NEGATIVE
UNIT DIVISION: 0
Unit division: 0
Unit division: 0

## 2018-06-28 LAB — GLUCOSE, CAPILLARY
Glucose-Capillary: 116 mg/dL — ABNORMAL HIGH (ref 70–99)
Glucose-Capillary: 111 mg/dL — ABNORMAL HIGH (ref 70–99)
Glucose-Capillary: 102 mg/dL — ABNORMAL HIGH (ref 70–99)
Glucose-Capillary: 139 mg/dL — ABNORMAL HIGH (ref 70–99)

## 2018-06-28 MED ORDER — CHLORHEXIDINE GLUCONATE CLOTH 2 % EX PADS: 6.0000 | MEDICATED_PAD | Freq: Every day | CUTANEOUS | Status: DC

## 2018-06-28 MED ORDER — DARBEPOETIN ALFA 100 MCG/0.5ML IJ SOSY
100.0000 ug | PREFILLED_SYRINGE | Freq: Once | INTRAMUSCULAR | Status: DC
  Filled 2018-06-28: qty 0.5

## 2018-06-28 MED ORDER — RISAQUAD PO CAPS
1.0000 | ORAL_CAPSULE | Freq: Every day | ORAL | Status: DC
  Administered 2018-06-28 – 2018-07-10 (×10): 1 via ORAL
  Filled 2018-06-28 (×11): qty 1

## 2018-06-28 MED ORDER — MIDODRINE HCL 5 MG PO TABS
10.0000 mg | ORAL_TABLET | Freq: Two times a day (BID) | ORAL | Status: DC
  Administered 2018-06-28 – 2018-07-07 (×16): 10 mg via ORAL
  Filled 2018-06-28 (×24): qty 2

## 2018-06-28 MED ORDER — DARBEPOETIN ALFA 100 MCG/0.5ML IJ SOSY
100.0000 ug | PREFILLED_SYRINGE | INTRAMUSCULAR | Status: DC
  Administered 2018-06-29: 10:00:00 100 ug via SUBCUTANEOUS
  Filled 2018-06-28: qty 0.5

## 2018-06-28 NOTE — Progress Notes (Signed)
Patient ID: Jose Sellers, male   DOB: 11/06/1960, 58 y.o.   MRN: [id number]   IR aware of request for HD tunneled cath removal Will call for pt when can

## 2018-06-28 NOTE — Progress Notes (Signed)
Inpatient Diabetes Program Recommendations  AACE/ADA: New Consensus Statement on Inpatient Glycemic Control (2015)  Target Ranges:  Prepandial:   less than 140 mg/dL      Peak postprandial:   less than 180 mg/dL (1-2 hours)      Critically ill patients:  140 - 180 mg/dL   Lab Results  Component Value Date   GLUCAP 111 (H) 06/28/2018   HGBA1C 6.0 (H) 05/01/2018    Review of Glycemic Control Results for HERRICK, HARTOG (MRN [id number]) as of 06/28/2018 11:49  Ref. Range 06/27/2018 21:24 06/28/2018 06:14 06/28/2018 10:59  Glucose-Capillary Latest Ref Range: 70 - 99 mg/dL 137 (H) 116 (H) 111 (H)   Diabetes history: DM2 Outpatient Diabetes medications: Lantus 8-12 units daily + Humalog 0-32 Current orders for Inpatient glycemic control: Lantus 8 units + Novolog sensitive tid  Inpatient Diabetes Program Recommendations:     Continue Novolog sensitive correction scale only. Discontinue Lantus PRN order.  Thanks, Bronson Curb, MSN, RNC-OB Diabetes Coordinator [phone] (8a-5p)

## 2018-06-28 NOTE — Progress Notes (Signed)
06/28/2018 Mrs. Terriquez (wife) requesting MD to call regarding questions/updates about patients care. Please call at  Cell #: [phone]

## 2018-06-28 NOTE — Progress Notes (Signed)
Physical Therapy Treatment Patient Details Name: Jose Sellers MRN: [id number] DOB: [birth year] Today's Date: 06/28/2018    History of Present Illness (P) Patient is 58 y/o male with PMH significant for CAD, CHF, COPD, ESRD on HD MWF, HTN, DMII, AICD, PAD, afib. Pt s/p left fem pop bypass and subsequent infection.  He had graft removal and vein patch of left femoral artery by Dr Donnetta Hutching 06/16/2018.  He developed bleeding from the left groin and was transferred from [facility] ER. Pt underwent patch angioplasty L common fem artery on 06/11/2018.    PT Comments    Patient received up in chair, very pleasant and willing to participate in PT today. Able to complete functional transfers with min guard from recliner and able to tolerate gait training approximately 156f with RW and slow, antalgic pace limited by pain and fatigue. Made multiple attempts to introduce stair training with patient however he repeatedly reclined due to pain this morning, reports he would like to try tomorrow. Able to toilet and perform pericare with S, however did require ModA for sit to stand from low commode. SPO2 remained mostly in the 90s on room air, with occasional dips into 80s easily corrected with pursed lip breathing. He was left up in the recliner with all needs met this morning.     Follow Up Recommendations  Home health PT;Supervision/Assistance - 24 hour     Equipment Recommendations  None recommended by PT    Recommendations for Other Services       Precautions / Restrictions Precautions Precautions: (P) Fall Restrictions Weight Bearing Restrictions: (P) No    Mobility  Bed Mobility               General bed mobility comments: (P) OOB in chair   Transfers Overall transfer level: (P) Needs assistance Equipment used: (P) Rolling walker (2 wheeled) Transfers: (P) Sit to/from Stand Sit to Stand: (P) Min guard;Mod assist         General transfer comment: (P) min guard for safety due to pain,  cues for hand placement; able to perform with min guard from recliner but required ModA from low toilet seat today   Ambulation/Gait Ambulation/Gait assistance: Min guard Gait Distance (Feet): 100 Feet Assistive device: Rolling walker (2 wheeled) Gait Pattern/deviations: Decreased stride length;Decreased weight shift to left;Antalgic;Step-through pattern;Trunk flexed Gait velocity: decreased   General Gait Details: able to gait train approximately 1059fwith min guard today, slow antalgic pattern limited by pain    Stairs             Wheelchair Mobility    Modified Rankin (Stroke Patients Only)       Balance Overall balance assessment: (P) Needs assistance Sitting-balance support: Feet supported;No upper extremity supported Sitting balance-Leahy Scale: Good     Standing balance support: Bilateral upper extremity supported Standing balance-Leahy Scale: Poor Standing balance comment: Reliant on UE support at this time.                             Cognition Arousal/Alertness: (P) Awake/alert Behavior During Therapy: (P) WFL for tasks assessed/performed Overall Cognitive Status: (P) Within Functional Limits for tasks assessed                                        Exercises      General Comments  Pertinent Vitals/Pain Pain Assessment: (P) Faces Pain Score: 8  Faces Pain Scale: (P) Hurts little more Pain Location: (P) LLE Pain Descriptors / Indicators: (P) Aching;Sharp Pain Intervention(s): (P) Monitored during session;Limited activity within patient's tolerance    Home Living                      Prior Function            PT Goals (current goals can now be found in the care plan section) Acute Rehab PT Goals Patient Stated Goal: Decrease pain PT Goal Formulation: With patient/family Time For Goal Achievement: 07/03/18 Potential to Achieve Goals: Good Progress towards PT goals: Progressing toward goals     Frequency    Min 3X/week      PT Plan Current plan remains appropriate    Co-evaluation              AM-PAC PT "6 Clicks" Mobility   Outcome Measure  Help needed turning from your back to your side while in a flat bed without using bedrails?: None Help needed moving from lying on your back to sitting on the side of a flat bed without using bedrails?: None Help needed moving to and from a bed to a chair (including a wheelchair)?: A Little Help needed standing up from a chair using your arms (e.g., wheelchair or bedside chair)?: A Little Help needed to walk in hospital room?: A Little Help needed climbing 3-5 steps with a railing? : A Lot 6 Click Score: 19    End of Session   Activity Tolerance: Patient limited by fatigue;Patient limited by pain Patient left: in chair;with call bell/phone within reach   PT Visit Diagnosis: Other abnormalities of gait and mobility (R26.89);Muscle weakness (generalized) (M62.81);Difficulty in walking, not elsewhere classified (R26.2);History of falling (Z91.81)     Time: 3403-7096 PT Time Calculation (min) (ACUTE ONLY): 32 min  Charges:  $Gait Training: 8-22 mins $Therapeutic Activity: 8-22 mins                     Deniece Ree PT, DPT, CBIS  Supplemental Physical Therapist [place]    Pager [phone] Acute Rehab Office [phone]

## 2018-06-28 NOTE — Progress Notes (Addendum)
[facility]  Subjective  - Left thigh tightness, left heel pain.   Objective 98/61 76 98.9 F (37.2 C) (Oral) 18 97%  Intake/Output Summary (Last 24 hours) at 06/28/2018 0719 Last data filed at 06/27/2018 1634 Gross per 24 hour  Intake 240 ml  Output 3119 ml  Net -2879 ml   Gen NAD Left groin with moderate SS/lymph drainage Medial thigh wet to dry dressing Doppler signal AT left, good active range of motion of left LE Right forearm with palpable thrill in fistula  Assessment/Planning:  58 y.o. male is s/p left common femoral endarterectomy and femoropopliteal bypass now this is excised with patch angioplasty of his left groin now with bovine pericardium and the above-knee remains with vein patch.  Wet-to-dry's.  Continue antibiotics for previous MRSA and Klebsiella on this admission.  Moderate SS/Lymph drainage from left groin.  Pending possible placement of wound vac prior to discharge.  Would likely wait til drainage dissipates.    Anemia managed by Nephrology at HD.    Roxy Horseman 06/28/2018 7:19 AM --  Laboratory Lab Results: Recent Labs    06/27/18 0730 06/28/18 0349  WBC 7.6 10.1  HGB 5.9* 7.1*  HCT 18.6* 22.0*  PLT 171 181   BMET Recent Labs    06/26/18 0259 06/27/18 0729  NA 136 130*  K 3.9 3.7  CL 100 94*  CO2 28 24  GLUCOSE 89 130*  BUN 21* 27*  CREATININE 3.80* 4.42*  CALCIUM 7.7* 7.8*    COAG Lab Results  Component Value Date   INR 1.5 (H) 06/03/2018   INR 1.23 05/01/2018   INR 1.08 02/13/2018   No results found for: PTT  I have examined the patient, reviewed and agree with above.  Left foot remains stable.  Does have a dry eschar on his heel and dry eschar on his great toe.  Popliteal incision granulating with some fibrinous exudate at the base  Left groin incision continues to have a great deal of lymphatic drainage.  No bleeding.  Continue wet-to-dry dressing  I had long discussion with the  patient and with his wife by telephone.  Explained the concern regarding the recurrent infection in his left groin now with a bovine pericardial patch in place.  Feel that he is at extremely high risk for ongoing patch disruption.  We will continue local wound care  Curt Jews, MD 06/28/2018 12:36 PM

## 2018-06-28 NOTE — Progress Notes (Signed)
Occupational Therapy Treatment Patient Details Name: DEMARQUS JOCSON MRN: [id number] DOB: 02/13/1961 Today's Date: 06/28/2018    History of present illness Patient is 58 y/o male with PMH significant for CAD, CHF, COPD, ESRD on HD MWF, HTN, DMII, AICD, PAD, afib. Pt s/p left fem pop bypass and subsequent infection.  He had graft removal and vein patch of left femoral artery by Dr Donnetta Hutching 06/16/2018.  He developed bleeding from the left groin and was transferred from [facility] ER. Pt underwent patch angioplasty L common fem artery on 05/27/2018.   OT comments  Pt performing OT skilled services for ambulation in room for activity tolerance O2 reading >87% on RA, but not always able to give a good reading. Pt performing light grooming in sitting as pt's MD and PA in room and had just changed his dressing increasing pain. Pt advised to use call bell for RN for pain meds- pt said he would. Pt continues to progress and increase safety with LLE pain. Pt did have 1 episode of LOB as LLE buckled and required standing rest break for stability. Pt would benefit from continued OT skilled services for ADL, mobility and safety acutely.     Follow Up Recommendations  No OT follow up    Equipment Recommendations  None recommended by OT    Recommendations for Other Services      Precautions / Restrictions Precautions Precautions: Fall Restrictions Weight Bearing Restrictions: No       Mobility Bed Mobility               General bed mobility comments: OOB in chair   Transfers Overall transfer level: Needs assistance Equipment used: Rolling walker (2 wheeled) Transfers: Sit to/from Stand Sit to Stand: Min guard;Mod assist         General transfer comment: min guard for safety due to pain, cues for hand placement    Balance Overall balance assessment: Needs assistance Sitting-balance support: Feet supported;No upper extremity supported Sitting balance-Leahy Scale: Good     Standing  balance support: Bilateral upper extremity supported Standing balance-Leahy Scale: Poor Standing balance comment: Reliant on UE support at this time.                            ADL either performed or assessed with clinical judgement   ADL Overall ADL's : Needs assistance/impaired     Grooming: Set up;Sitting                               Functional mobility during ADLs: Min guard;Rolling walker General ADL Comments: Pt ambulating 150' prior to L knee buckling due to pain on the way back with RW. Pt with standing rest breaks along the way. O2 couldn't get a good reading, but when pt returned to beed in supine with HOB elevated, pt increased to >90% on RA.     Vision       Perception     Praxis      Cognition Arousal/Alertness: Awake/alert Behavior During Therapy: WFL for tasks assessed/performed Overall Cognitive Status: Within Functional Limits for tasks assessed                                          Exercises     Shoulder Instructions  General Comments      Pertinent Vitals/ Pain       Pain Assessment: Faces Pain Score: 8  Faces Pain Scale: Hurts little more Pain Location: LLE Pain Descriptors / Indicators: Aching;Sharp Pain Intervention(s): Monitored during session;Limited activity within patient's tolerance  Home Living                                          Prior Functioning/Environment              Frequency  Min 2X/week        Progress Toward Goals  OT Goals(current goals can now be found in the care plan section)  Progress towards OT goals: Progressing toward goals  Acute Rehab OT Goals Patient Stated Goal: Decrease pain OT Goal Formulation: With patient Time For Goal Achievement: 07/10/18 Potential to Achieve Goals: Good ADL Goals Pt Will Perform Grooming: with modified independence;standing Pt Will Perform Lower Body Bathing: with modified independence;with  adaptive equipment;sit to/from stand Pt Will Perform Lower Body Dressing: with modified independence;sit to/from stand;with adaptive equipment Pt Will Transfer to Toilet: with modified independence;ambulating Pt Will Perform Toileting - Clothing Manipulation and hygiene: with modified independence;sit to/from stand Pt Will Perform Tub/Shower Transfer: Shower transfer;with supervision;ambulating;rolling walker  Plan Discharge plan remains appropriate    Co-evaluation                 AM-PAC OT "6 Clicks" Daily Activity     Outcome Measure   Help from another person eating meals?: None Help from another person taking care of personal grooming?: A Little Help from another person toileting, which includes using toliet, bedpan, or urinal?: A Little Help from another person bathing (including washing, rinsing, drying)?: A Little Help from another person to put on and taking off regular upper body clothing?: None Help from another person to put on and taking off regular lower body clothing?: A Lot 6 Click Score: 19    End of Session Equipment Utilized During Treatment: Rolling walker;Gait belt  OT Visit Diagnosis: Unsteadiness on feet (R26.81);Other abnormalities of gait and mobility (R26.89);Pain;History of falling (Z91.81);Muscle weakness (generalized) (M62.81)   Activity Tolerance Patient tolerated treatment well   Patient Left in bed;with call bell/phone within reach;with bed alarm set   Nurse Communication Mobility status        Time: 3875-6433 OT Time Calculation (min): 33 min  Charges: OT General Charges $OT Visit: 1 Visit OT Treatments $Self Care/Home Management : 8-22 mins $Therapeutic Activity: 8-22 mins  Darryl Nestle) Marsa Aris OTR/L Acute Rehabilitation Services Pager: [phone] Office: [phone]    Fredda Hammed 06/28/2018, 2:29 PM

## 2018-06-28 NOTE — Progress Notes (Addendum)
[facility] Progress Note  Subjective: seen in room, Hb up 7.1 after prbc x 1.  No c/o today.   Vitals:   06/28/18 0353 06/28/18 0802 06/28/18 0903 06/28/18 1104  BP: 98/61  (!) 126/58 (!) 110/54  Pulse: 76 71 73 78  Resp: 18 15  (!) 21  Temp: 98.9 F (37.2 C)   (!) 97.5 F (36.4 C)  TempSrc: Oral   Oral  SpO2: 97% 99%  96%  Weight:      Height:        Inpatient medications: . sodium chloride   Intravenous Once  . sodium chloride   Intravenous Once  . acidophilus  1 capsule Oral Daily  . amiodarone  200 mg Oral BID  . aspirin EC  81 mg Oral Daily  . atorvastatin  40 mg Oral BID  . calcitRIOL  0.5 mcg Oral Q M,W,F-HD  . Chlorhexidine Gluconate Cloth  6 each Topical Q0600  . darbepoetin (ARANESP) injection - DIALYSIS  100 mcg Intravenous Q Wed-HD  . docusate sodium  100 mg Oral Daily  . feeding supplement (PRO-STAT SUGAR FREE 64)  30 mL Oral BID  . ferric citrate  420 mg Oral TID WC  . FLUoxetine  20 mg Oral Daily  . furosemide  40 mg Oral Q T,Th,S,Su  . gabapentin  100 mg Oral TID  . insulin aspart  0-9 Units Subcutaneous TID WC  . levalbuterol  0.63 mg Inhalation BID  . metoprolol tartrate  12.5 mg Oral BID  . multivitamin  1 tablet Oral QHS  . pantoprazole  40 mg Oral Daily  . vancomycin  125 mg Oral Q6H   . [START ON 06/29/2018]  ceFAZolin (ANCEF) IV    . magnesium sulfate 1 - 4 g bolus IVPB    . [START ON 06/29/2018] vancomycin     acetaminophen **OR** acetaminophen, bisacodyl, guaiFENesin-dextromethorphan, hydrALAZINE, HYDROmorphone (DILAUDID) injection, labetalol, magnesium sulfate 1 - 4 g bolus IVPB, metoprolol tartrate, nitroGLYCERIN, ondansetron, oxycodone, oxyCODONE-acetaminophen, phenol, polyethylene glycol  Iron/TIBC/Ferritin/ %Sat No results found for: IRON, TIBC, FERRITIN, IRONPCTSAT  Exam: General:  NAD chronically ill appearing Lungs: CTA bilaterally  Heart: RRR with S1 S2. No jvd Abdomen: soft NT + BS Lower extremities: left leg groin  incision in tact, slight opening of wound no pus or odor. Bilat 2+ pitting edema below the knees.  Neuro: A & O  X 3. nonfocal Dialysis Access: right lower AVF + bruit  Right IJ TDC    home meds:  - esomeprazole 40 qd/ ferric citrate ac  - aspirin 81/ atorvastatin 40 bid/ sl ntg prn  - amiodarone 200 bid/ metoprolol 12.5 bid  - fluoxetine 20 qd/ gabapentin 300 tid/ oxycodone IR prn  - insulin glargine 8 u pm/ insulin lispro SSI tid ac  - tiotropium 18 ug qd/ levalbuterol bid nebs  - prn's/ vitamins/ supplements  [place] MWF  4h 350/1.5 78kg 2/2.25 bath P2 R AVF/ TDC still in Hep none Venofer 100 mg IV x 2 no ESA Calcitriol 0.5 mcg PO TIW  Assessment/Plan: 1. Bleeding L groin wound / infection: pt is sp left fem pop bypass then subsequent infection w/ graft removal and vein patch around 3/20. Admitted w/ bleeding from the L groin wound, went to OR 3/30 for repair of left groin wound by VVS.  Getting IV abx for +klebs oxytoca this admit from grion wound and for +mrsa from last admit also from wound 2. ESRD -  MWF HD. We used AVF  4 of 4 sessions on last admit. Will consider removal of TDC originally placed at [facility]. HD Friday.  3. BP /volume  - at dry wt but sig 2+ pitting LE edema bilat. Needs lowering of dry wt , bp's soft, will add midodrine short-term 4. ABLA - s/p 3 units PRBC. Will start esa w/ darbe 100 weekly and check fe/tibc. Hb 7.1.  5. Metabolic bone disease -  Calcitriol/binders 6.  Nutrition - renal diet/vits/add suppelments alb low 7. COPD - home O2 8. Chr systolic CHF EF 83-38% EF  9. Chronic afib - on amio/BB and elliquis previously 10. Neuropathy - reduced neurontin to 100 tid (ESRD appropriate dose) from 300 tid  11. MRSA contact precautions 12. DM -per primary 13. Left heel ulcer - follow   [facility] 06/28/2018, 1:00 PM  Recent Labs  Lab 06/18/2018 1939 06/26/18 0259 06/27/18 0729  NA  --  136 130*  K  --  3.9 3.7  CL   --  100 94*  CO2  --  28 24  GLUCOSE  --  89 130*  BUN  --  21* 27*  CREATININE  --  3.80* 4.42*  CALCIUM  --  7.7* 7.8*  PHOS  --   --  2.6  ALBUMIN  --   --  1.9*  INR 1.5*  --   --    No results for input(s): AST, ALT, ALKPHOS, BILITOT, PROT in the last 168 hours. Recent Labs  Lab 06/27/18 0730 06/28/18 0349  WBC 7.6 10.1  HGB 5.9* 7.1*  HCT 18.6* 22.0*  MCV 86.5 85.6  PLT 171 181

## 2018-06-29 ENCOUNTER — Inpatient Hospital Stay (HOSPITAL_COMMUNITY): Payer: Medicare Other

## 2018-06-29 ENCOUNTER — Encounter (HOSPITAL_COMMUNITY): Payer: Self-pay | Admitting: Interventional Radiology

## 2018-06-29 HISTORY — PX: IR REMOVAL TUN CV CATH W/O FL: IMG2289

## 2018-06-29 LAB — RENAL FUNCTION PANEL
Albumin: 2.1 g/dL — ABNORMAL LOW (ref 3.5–5.0)
Anion gap: 13 (ref 5–15)
BUN: 35 mg/dL — ABNORMAL HIGH (ref 6–20)
CO2: 23 mmol/L (ref 22–32)
Calcium: 8.5 mg/dL — ABNORMAL LOW (ref 8.9–10.3)
Chloride: 90 mmol/L — ABNORMAL LOW (ref 98–111)
Creatinine, Ser: 4.91 mg/dL — ABNORMAL HIGH (ref 0.61–1.24)
GFR calc Af Amer: 14 mL/min — ABNORMAL LOW (ref >60)
GFR calc non Af Amer: 12 mL/min — ABNORMAL LOW (ref >60)
Glucose, Bld: 101 mg/dL — ABNORMAL HIGH (ref 70–99)
Phosphorus: 2.9 mg/dL (ref 2.5–4.6)
Potassium: 4.5 mmol/L (ref 3.5–5.1)
Sodium: 126 mmol/L — ABNORMAL LOW (ref 135–145)

## 2018-06-29 LAB — CBC
HCT: 24.2 % — ABNORMAL LOW (ref 39.0–52.0)
Hemoglobin: 7.5 g/dL — ABNORMAL LOW (ref 13.0–17.0)
MCH: 26.9 pg (ref 26.0–34.0)
MCHC: 31 g/dL (ref 30.0–36.0)
MCV: 86.7 fL (ref 80.0–100.0)
Platelets: 229 10*3/uL (ref 150–400)
RBC: 2.79 MIL/uL — ABNORMAL LOW (ref 4.22–5.81)
RDW: 17.1 % — ABNORMAL HIGH (ref 11.5–15.5)
WBC: 9.7 10*3/uL (ref 4.0–10.5)
nRBC: 0 % (ref 0.0–0.2)

## 2018-06-29 LAB — GLUCOSE, CAPILLARY
Glucose-Capillary: 125 mg/dL — ABNORMAL HIGH (ref 70–99)
Glucose-Capillary: 91 mg/dL (ref 70–99)
Glucose-Capillary: 91 mg/dL (ref 70–99)

## 2018-06-29 LAB — IRON AND TIBC
Iron: 22 ug/dL — ABNORMAL LOW (ref 45–182)
Saturation Ratios: 12 % — ABNORMAL LOW (ref 17.9–39.5)
TIBC: 182 ug/dL — ABNORMAL LOW (ref 250–450)
UIBC: 160 ug/dL

## 2018-06-29 MED ORDER — LIDOCAINE HCL (PF) 1 % IJ SOLN
INTRAMUSCULAR | Status: DC | PRN
  Administered 2018-06-29: 5 mL

## 2018-06-29 MED ORDER — CHLORHEXIDINE GLUCONATE CLOTH 2 % EX PADS
6.0000 | MEDICATED_PAD | Freq: Every day | CUTANEOUS | Status: DC
  Administered 2018-07-02 – 2018-07-03 (×2): 6 via TOPICAL

## 2018-06-29 MED ORDER — CALCITRIOL 0.5 MCG PO CAPS
ORAL_CAPSULE | ORAL | Status: AC
  Filled 2018-06-29: qty 1

## 2018-06-29 MED ORDER — LIDOCAINE HCL 1 % IJ SOLN
INTRAMUSCULAR | Status: AC
  Filled 2018-06-29: qty 20

## 2018-06-29 MED ORDER — DARBEPOETIN ALFA 100 MCG/0.5ML IJ SOSY
PREFILLED_SYRINGE | INTRAMUSCULAR | Status: AC
  Administered 2018-06-29: 100 ug via SUBCUTANEOUS
  Filled 2018-06-29: qty 0.5

## 2018-06-29 MED ORDER — MIDODRINE HCL 5 MG PO TABS
ORAL_TABLET | ORAL | Status: AC
  Filled 2018-06-29: qty 2

## 2018-06-29 MED ORDER — CHLORHEXIDINE GLUCONATE 4 % EX LIQD
CUTANEOUS | Status: AC
  Filled 2018-06-29: qty 15

## 2018-06-29 NOTE — Progress Notes (Signed)
[facility] [place]  Subjective  - States left leg stable.  Wet to dry in left groin, no bleeding.   Objective 132/65 66 98 F (36.7 C) (Oral) 18 92% No intake or output data in the 24 hours ending 06/29/18 0856 Gen NAD Left groin with moderate SS/lymph drainage Medial thigh wet to dry dressing Doppler signal AT left, good active range of motion of left LE   Assessment/Planning:  58 y.o. male is s/p left common femoral endarterectomy and femoropopliteal bypass now excised with patch angioplasty of his left CFA with bovine pericardium and the above-knee remains with vein patch. Continuevanc/ancef for MRSA and Klebsiella on wound cultures.  Moderate SS/Lymph drainage from left groin.  Wet to dry this am, will plan for wound vac.  Likely keep through weekend to ensure no disruption of patch (high risk) and for vac change on Monday.   WBC, Hgb stable.  HD for ESRD.  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  Marty Heck 06/29/2018 8:56 AM --  Laboratory Lab Results: Recent Labs    06/28/18 0349 06/29/18 0721  WBC 10.1 9.7  HGB 7.1* 7.5*  HCT 22.0* 24.2*  PLT 181 229   BMET Recent Labs    06/27/18 0729 06/29/18 0722  NA 130* 126*  K 3.7 4.5  CL 94* 90*  CO2 24 23  GLUCOSE 130* 101*  BUN 27* 35*  CREATININE 4.42* 4.91*  CALCIUM 7.8* 8.5*    COAG Lab Results  Component Value Date   INR 1.5 (H) 06/14/2018   INR 1.23 05/01/2018   INR 1.08 02/13/2018   No results found for: PTT   Marty Heck, MD [facility] [place] Office: [phone] Pager: [place]

## 2018-06-29 NOTE — TOC Progression Note (Signed)
Transition of Care (TOC) - Progression Note  Marvetta Gibbons RN, BSN Transitions of Care Unit 4E- RN Case Manager [phone] Patient Details  Name: DELVIN HEDEEN MRN: [id number] Date of Birth: Jun 20, 1960  Transition of Care [facility]) CM/SW Contact  Dahlia Client Romeo Rabon, [place] Phone Number: [phone] 06/29/2018, 1:05 PM  Clinical Narrative:    Noted pt had wound VAC placed again to wound needs, pt has home wound vac at bedside for transition home, [facility] aware as is [facility] who brought up tubing and supplies for VAC to bedside. Butch Penny with [facility] will follow for discharge date and attach home wound VAC once pt has d/c order for transition home.    Expected Discharge Plan: [place] Barriers to Discharge: No Barriers Identified  Expected Discharge Plan and Services Expected Discharge Plan: [place]   Discharge Planning Services: CM Consult Post Acute Care Choice: Home Health, Resumption of Svcs/PTA Provider Living arrangements for the past 2 months: Single Family Home                 DME Arranged: Vac DME Agency: [facility] HH Arranged: RN [place] Agency: [place] ([facility])   Social Determinants of Health (SDOH) Interventions    Readmission Risk Interventions Readmission Risk Prevention Plan 06/20/2018 06/19/2018  Transportation Screening - Complete  PCP or Specialist Appt within 3-5 Days - Complete  HRI or [place] - Complete  Social Work Consult for [place] Planning/Counseling - Complete  Palliative Care Screening - Not Applicable  Medication Review Press photographer) - Complete  PCP or Specialist appointment within 3-5 days of discharge Complete -  [place] or [place] Complete -  SW Recovery Care/Counseling Consult Complete -  Palliative Care Screening Not Applicable -  [place] Not Applicable -  Some recent data might be hidden

## 2018-06-29 NOTE — Procedures (Signed)
Pre-procedure Diagnosis: ESRD; functional extremity dialysis access, no longer in need of HD catheter Post-procedure Diagnosis: Same  Successful bedside removal of R jugular approach HD catheter. Complications: None Immediate  EBL: Minimal   The catheter is ready for immediate use.   Ronny Bacon, MD Pager #: [phone]

## 2018-06-29 NOTE — Consult Note (Signed)
[place] Nurse wound consult note Patient receiving care in [facility] 4E01.  Primary RN, Selinda Eon, present during [facility] dressing placement and initiation of therapy. Reason for Consult: NPWT to left groin Wound type: surgical site Measurement:11.3 cm x 1.4 cm x 1.1 cm Wound bed: 223% slick, pink Drainage (amount, consistency, odor) no odor, no induration, no erythema.  Heavy serous drainage (Lymphatic per MD note). Periwound: Intact Dressing procedure/placement/frequency: MWF VAC dressing placement using 2 barrier rings to encircle the wound and obtain/maintain a seal.  One piece of black foam placed into the wound.  Immediate seal obtained at 125 mmHg.  Patient tolerated the procedure with minimal discomfort.  Extra drape and two barrier rings placed in the cabinet below the sink. Val Riles, RN, MSN, CWOCN, CNS-BC, pager [phone]

## 2018-06-29 NOTE — Progress Notes (Signed)
Physical Therapy Treatment Patient Details Name: Jose Sellers MRN: [id number] DOB: 10-11-1960 Today's Date: 06/29/2018    History of Present Illness Patient is 58 y/o male with PMH significant for CAD, CHF, COPD, ESRD on HD MWF, HTN, DMII, AICD, PAD, afib. Pt s/p left fem pop bypass and subsequent infection.  He had graft removal and vein patch of left femoral artery by Dr Donnetta Hutching 06/16/2018.  He developed bleeding from the left groin and was transferred from [facility] ER. Pt underwent patch angioplasty L common fem artery on 06/02/2018.    PT Comments    Upon entry, pt requesting to be assisted to bathroom. Performing mobility/ambulation at mainly supervision level with walker, although did require min assist to stand from low surface of toilet. Further gait/stair training limited by arrival of [place] to place wound vac. Will benefit from trialing stairs prior to discharge.    Follow Up Recommendations  Home health PT;Supervision/Assistance - 24 hour     Equipment Recommendations  None recommended by PT    Recommendations for Other Services       Precautions / Restrictions Precautions Precautions: Fall Restrictions Weight Bearing Restrictions: No    Mobility  Bed Mobility Overal bed mobility: Modified Independent             General bed mobility comments: HOB elevated  Transfers Overall transfer level: Needs assistance Equipment used: Rolling walker (2 wheeled) Transfers: Sit to/from Stand Sit to Stand: Supervision;Min assist         General transfer comment: Min assist to stand from toilet  Ambulation/Gait Ambulation/Gait assistance: Supervision Gait Distance (Feet): 30 Feet Assistive device: Rolling walker (2 wheeled) Gait Pattern/deviations: Decreased stride length;Decreased weight shift to left;Antalgic;Step-through pattern;Trunk flexed Gait velocity: decreased Gait velocity interpretation: <1.8 ft/sec, indicate of risk for recurrent falls General Gait  Details: Trunk flexed position throughout   Stairs             Wheelchair Mobility    Modified Rankin (Stroke Patients Only)       Balance Overall balance assessment: Needs assistance Sitting-balance support: Feet supported;No upper extremity supported Sitting balance-Leahy Scale: Good     Standing balance support: Bilateral upper extremity supported Standing balance-Leahy Scale: Poor Standing balance comment: Reliant on UE support at this time.                             Cognition Arousal/Alertness: Awake/alert Behavior During Therapy: WFL for tasks assessed/performed Overall Cognitive Status: Within Functional Limits for tasks assessed                                        Exercises      General Comments        Pertinent Vitals/Pain Pain Assessment: Faces Faces Pain Scale: Hurts even more Pain Location: LLE Pain Descriptors / Indicators: Discomfort;Guarding;Burning    Home Living                      Prior Function            PT Goals (current goals can now be found in the care plan section) Acute Rehab PT Goals Patient Stated Goal: Decrease pain PT Goal Formulation: With patient/family Time For Goal Achievement: 07/03/18 Potential to Achieve Goals: Good Progress towards PT goals: Progressing toward goals    Frequency    Min  3X/week      PT Plan Current plan remains appropriate    Co-evaluation              AM-PAC PT "6 Clicks" Mobility   Outcome Measure  Help needed turning from your back to your side while in a flat bed without using bedrails?: None Help needed moving from lying on your back to sitting on the side of a flat bed without using bedrails?: None Help needed moving to and from a bed to a chair (including a wheelchair)?: A Little Help needed standing up from a chair using your arms (e.g., wheelchair or bedside chair)?: A Little Help needed to walk in hospital room?: A  Little Help needed climbing 3-5 steps with a railing? : A Lot 6 Click Score: 19    End of Session Equipment Utilized During Treatment: Gait belt Activity Tolerance: Patient limited by fatigue Patient left: with call bell/phone within reach;in chair Nurse Communication: Mobility status PT Visit Diagnosis: Other abnormalities of gait and mobility (R26.89);Muscle weakness (generalized) (M62.81);Difficulty in walking, not elsewhere classified (R26.2);History of falling (Z91.81)     Time: 3500-9381 PT Time Calculation (min) (ACUTE ONLY): 36 min  Charges:  $Therapeutic Activity: 23-37 mins                    Ellamae Sia, [place], DPT Acute Rehabilitation Services Pager [phone] Office [phone]    Willy Eddy 06/29/2018, 2:59 PM

## 2018-06-29 NOTE — Progress Notes (Signed)
[facility] Progress Note  Subjective: Subjective: Hb up at 7.5 today.  Wt's sig up, pt states has been drinking " a lot of water".  No SOB / cough. -2.5 L on HD today.  Vitals:   06/29/18 1000 06/29/18 1030 06/29/18 1108 06/29/18 1150  BP: (!) 134/55 126/60 120/61 126/61  Pulse: 64 64 67 71  Resp:   17 18  Temp:   97.8 F (36.6 C) 98.4 F (36.9 C)  TempSrc:   Oral Oral  SpO2:   92%   Weight:   81.3 kg   Height:        Inpatient medications: . acidophilus  1 capsule Oral Daily  . amiodarone  200 mg Oral BID  . aspirin EC  81 mg Oral Daily  . atorvastatin  40 mg Oral BID  . calcitRIOL  0.5 mcg Oral Q M,W,F-HD  . Chlorhexidine Gluconate Cloth  6 each Topical Q0600  . [START ON 07/06/2018] darbepoetin (ARANESP) injection - NON-DIALYSIS  100 mcg Subcutaneous Q Fri-1800  . docusate sodium  100 mg Oral Daily  . feeding supplement (PRO-STAT SUGAR FREE 64)  30 mL Oral BID  . ferric citrate  420 mg Oral TID WC  . FLUoxetine  20 mg Oral Daily  . gabapentin  100 mg Oral TID  . insulin aspart  0-9 Units Subcutaneous TID WC  . levalbuterol  0.63 mg Inhalation BID  . metoprolol tartrate  12.5 mg Oral BID  . midodrine  10 mg Oral BID WC  . multivitamin  1 tablet Oral QHS  . pantoprazole  40 mg Oral Daily  . vancomycin  125 mg Oral Q6H   .  ceFAZolin (ANCEF) IV 2 g (06/29/18 1027)  . magnesium sulfate 1 - 4 g bolus IVPB    . vancomycin 1,000 mg (06/29/18 0923)   acetaminophen **OR** acetaminophen, bisacodyl, guaiFENesin-dextromethorphan, HYDROmorphone (DILAUDID) injection, labetalol, magnesium sulfate 1 - 4 g bolus IVPB, nitroGLYCERIN, ondansetron, oxycodone, oxyCODONE-acetaminophen, phenol, polyethylene glycol Exam: General:  NAD chronically ill appearing Lungs: CTA bilaterally  Heart: RRR with S1 S2. No jvd Abdomen: soft NT + BS Lower extremities: left leg groin incision in tact, slight opening of wound no pus or odor. Bilat worsneing 2-3+ edema both LE's.  Neuro: A  & O  X 3. nonfocal Dialysis Access: right lower AVF + bruit  Right IJ TDC    home meds:  - esomeprazole 40 qd/ ferric citrate ac  - aspirin 81/ atorvastatin 40 bid/ sl ntg prn  - amiodarone 200 bid/ metoprolol 12.5 bid  - fluoxetine 20 qd/ gabapentin 300 tid/ oxycodone IR prn  - insulin glargine 8 u pm/ insulin lispro SSI tid ac  - tiotropium 18 ug qd/ levalbuterol bid nebs  - prn's/ vitamins/ supplements  [place] MWF  4h 350/1.5 78kg 2/2.25 bath P2 R AVF/ TDC still in Hep none Venofer 100 mg IV x 2 no ESA Calcitriol 0.5 mcg PO TIW  Assessment/Plan: 1. Bleeding L groin wound / infection: pt is sp left fem pop bypass then subsequent infection w/ graft removal and vein patch around 3/20. Admitted w/ bleeding from the L groin wound, went to OR 3/30 for repair of left groin wound by VVS.  Getting IV abx for +klebs oxytoca this admit from grion wound and for +mrsa from last admit also from wound 2. ESRD -  MWF HD. We used AVF 4 of 4 sessions on last admit. Consulted IR for TDC removal. HD today.  3. Volume overload -  pt drinking sig fluid, have ordered signs for fluid restriction to be placed in the room. Needs extra dialysis Sat for vol overload. Up 3kg but has more vol than that, will need dry wt lowering if po intake can be curtailed by patient.  4. ABLA - s/p 3 units PRBC. Got darbe 100 today (q Friday) and will start IV Fe load for low tsat 12%. Hb 7.5  5. Metabolic bone disease -  Calcitriol/binders 6.  Nutrition - renal diet/vits/add suppelments alb low 7. COPD - home O2 8. Chr systolic CHF EF 09-23% EF  9. Chronic afib - on amio/BB and elliquis previously 10. Neuropathy - reduced neurontin to 100 tid (ESRD appropriate dose) from 300 tid  11. MRSA contact precautions 12. DM -per primary 13. Left heel ulcer - follow    [facility] 06/29/2018, 12:28 PM  Iron/TIBC/Ferritin/ %Sat    Component Value Date/Time   IRON 22 (L) 06/29/2018 0123    TIBC 182 (L) 06/29/2018 0123   IRONPCTSAT 12 (L) 06/29/2018 0123   Recent Labs  Lab 06/02/2018 1939  06/29/18 0722  NA  --    < > 126*  K  --    < > 4.5  CL  --    < > 90*  CO2  --    < > 23  GLUCOSE  --    < > 101*  BUN  --    < > 35*  CREATININE  --    < > 4.91*  CALCIUM  --    < > 8.5*  PHOS  --    < > 2.9  ALBUMIN  --    < > 2.1*  INR 1.5*  --   --    < > = values in this interval not displayed.   No results for input(s): AST, ALT, ALKPHOS, BILITOT, PROT in the last 168 hours. Recent Labs  Lab 06/29/18 0721  WBC 9.7  HGB 7.5*  HCT 24.2*  PLT 229

## 2018-06-30 ENCOUNTER — Inpatient Hospital Stay (HOSPITAL_COMMUNITY): Payer: Medicare Other | Admitting: Certified Registered"

## 2018-06-30 ENCOUNTER — Encounter (HOSPITAL_COMMUNITY): Admission: EM | Disposition: E | Payer: Self-pay | Source: Home / Self Care | Attending: Vascular Surgery

## 2018-06-30 DIAGNOSIS — I97618 Postprocedural hemorrhage and hematoma of a circulatory system organ or structure following other circulatory system procedure: Secondary | ICD-10-CM

## 2018-06-30 HISTORY — PX: WOUND EXPLORATION: SHX6188

## 2018-06-30 LAB — RENAL FUNCTION PANEL
Albumin: 2 g/dL — ABNORMAL LOW (ref 3.5–5.0)
Anion gap: 9 (ref 5–15)
BUN: 24 mg/dL — ABNORMAL HIGH (ref 6–20)
CO2: 27 mmol/L (ref 22–32)
Calcium: 8.4 mg/dL — ABNORMAL LOW (ref 8.9–10.3)
Chloride: 95 mmol/L — ABNORMAL LOW (ref 98–111)
Creatinine, Ser: 3.81 mg/dL — ABNORMAL HIGH (ref 0.61–1.24)
GFR calc Af Amer: 19 mL/min — ABNORMAL LOW (ref >60)
GFR calc non Af Amer: 17 mL/min — ABNORMAL LOW (ref >60)
Glucose, Bld: 125 mg/dL — ABNORMAL HIGH (ref 70–99)
Phosphorus: 2.7 mg/dL (ref 2.5–4.6)
Potassium: 4 mmol/L (ref 3.5–5.1)
Sodium: 131 mmol/L — ABNORMAL LOW (ref 135–145)

## 2018-06-30 LAB — CBC WITH DIFFERENTIAL/PLATELET
Abs Immature Granulocytes: 0.04 10*3/uL (ref 0.00–0.07)
Basophils Absolute: 0 10*3/uL (ref 0.0–0.1)
Basophils Relative: 0 %
Eosinophils Absolute: 0.1 10*3/uL (ref 0.0–0.5)
Eosinophils Relative: 1 %
HCT: 21.3 % — ABNORMAL LOW (ref 39.0–52.0)
Hemoglobin: 6.8 g/dL — CL (ref 13.0–17.0)
Immature Granulocytes: 0 %
Lymphocytes Relative: 11 %
Lymphs Abs: 1.2 10*3/uL (ref 0.7–4.0)
MCH: 28.1 pg (ref 26.0–34.0)
MCHC: 31.9 g/dL (ref 30.0–36.0)
MCV: 88 fL (ref 80.0–100.0)
Monocytes Absolute: 0.9 10*3/uL (ref 0.1–1.0)
Monocytes Relative: 8 %
Neutro Abs: 9.2 10*3/uL — ABNORMAL HIGH (ref 1.7–7.7)
Neutrophils Relative %: 80 %
Platelets: 286 10*3/uL (ref 150–400)
RBC: 2.42 MIL/uL — ABNORMAL LOW (ref 4.22–5.81)
RDW: 17.7 % — ABNORMAL HIGH (ref 11.5–15.5)
WBC: 11.5 10*3/uL — ABNORMAL HIGH (ref 4.0–10.5)
nRBC: 0 % (ref 0.0–0.2)

## 2018-06-30 LAB — CBC
HCT: 26 % — ABNORMAL LOW (ref 39.0–52.0)
Hemoglobin: 8 g/dL — ABNORMAL LOW (ref 13.0–17.0)
MCH: 26.9 pg (ref 26.0–34.0)
MCHC: 30.8 g/dL (ref 30.0–36.0)
MCV: 87.5 fL (ref 80.0–100.0)
Platelets: 255 10*3/uL (ref 150–400)
RBC: 2.97 MIL/uL — ABNORMAL LOW (ref 4.22–5.81)
RDW: 17.6 % — ABNORMAL HIGH (ref 11.5–15.5)
WBC: 9 10*3/uL (ref 4.0–10.5)
nRBC: 0 % (ref 0.0–0.2)

## 2018-06-30 LAB — BASIC METABOLIC PANEL
Anion gap: 5 (ref 5–15)
BUN: 16 mg/dL (ref 6–20)
CO2: 29 mmol/L (ref 22–32)
Calcium: 7.6 mg/dL — ABNORMAL LOW (ref 8.9–10.3)
Chloride: 100 mmol/L (ref 98–111)
Creatinine, Ser: 2.72 mg/dL — ABNORMAL HIGH (ref 0.61–1.24)
GFR calc Af Amer: 29 mL/min — ABNORMAL LOW (ref >60)
GFR calc non Af Amer: 25 mL/min — ABNORMAL LOW (ref >60)
Glucose, Bld: 120 mg/dL — ABNORMAL HIGH (ref 70–99)
Potassium: 3.4 mmol/L — ABNORMAL LOW (ref 3.5–5.1)
Sodium: 134 mmol/L — ABNORMAL LOW (ref 135–145)

## 2018-06-30 LAB — GLUCOSE, CAPILLARY
Glucose-Capillary: 105 mg/dL — ABNORMAL HIGH (ref 70–99)
Glucose-Capillary: 105 mg/dL — ABNORMAL HIGH (ref 70–99)
Glucose-Capillary: 105 mg/dL — ABNORMAL HIGH (ref 70–99)
Glucose-Capillary: 119 mg/dL — ABNORMAL HIGH (ref 70–99)
Glucose-Capillary: 106 mg/dL — ABNORMAL HIGH (ref 70–99)

## 2018-06-30 LAB — PREPARE RBC (CROSSMATCH)

## 2018-06-30 SURGERY — WOUND EXPLORATION
Anesthesia: General | Laterality: Left

## 2018-06-30 MED ORDER — LIDOCAINE 2% (20 MG/ML) 5 ML SYRINGE
INTRAMUSCULAR | Status: AC
  Filled 2018-06-30: qty 5

## 2018-06-30 MED ORDER — MIDAZOLAM HCL 2 MG/2ML IJ SOLN
INTRAMUSCULAR | Status: AC
  Filled 2018-06-30: qty 2

## 2018-06-30 MED ORDER — CALCIUM CHLORIDE 10 % IV SOLN
INTRAVENOUS | Status: DC | PRN
  Administered 2018-06-30 (×2): 100 mg via INTRAVENOUS

## 2018-06-30 MED ORDER — PROPOFOL 10 MG/ML IV BOLUS
INTRAVENOUS | Status: AC
  Filled 2018-06-30: qty 20

## 2018-06-30 MED ORDER — CEFAZOLIN SODIUM-DEXTROSE 2-4 GM/100ML-% IV SOLN
INTRAVENOUS | Status: AC
  Filled 2018-06-30: qty 100

## 2018-06-30 MED ORDER — MIDODRINE HCL 5 MG PO TABS
ORAL_TABLET | ORAL | Status: AC
  Administered 2018-06-30: 10 mg via ORAL
  Filled 2018-06-30: qty 2

## 2018-06-30 MED ORDER — CEFAZOLIN SODIUM-DEXTROSE 2-3 GM-%(50ML) IV SOLR
INTRAVENOUS | Status: DC | PRN
  Administered 2018-06-30: 2 g via INTRAVENOUS

## 2018-06-30 MED ORDER — SODIUM CHLORIDE 0.9 % IV SOLN: 10.0000 mL/h | Freq: Once | INTRAVENOUS | Status: DC

## 2018-06-30 MED ORDER — SODIUM CHLORIDE 0.9 % IV SOLN
INTRAVENOUS | Status: DC | PRN
  Administered 2018-06-30: 16:00:00 via INTRAVENOUS

## 2018-06-30 MED ORDER — PROMETHAZINE HCL 25 MG/ML IJ SOLN: 6.2500 mg | INTRAMUSCULAR | Status: DC | PRN

## 2018-06-30 MED ORDER — FENTANYL CITRATE (PF) 100 MCG/2ML IJ SOLN
INTRAMUSCULAR | Status: DC | PRN
  Administered 2018-06-30 (×2): 50 ug via INTRAVENOUS

## 2018-06-30 MED ORDER — FENTANYL CITRATE (PF) 100 MCG/2ML IJ SOLN
25.0000 ug | INTRAMUSCULAR | Status: DC | PRN
  Administered 2018-06-30: 25 ug via INTRAVENOUS

## 2018-06-30 MED ORDER — OXYCODONE HCL 5 MG/5ML PO SOLN: 5.0000 mg | Freq: Once | ORAL | Status: DC | PRN

## 2018-06-30 MED ORDER — 0.9 % SODIUM CHLORIDE (POUR BTL) OPTIME
TOPICAL | Status: DC | PRN
  Administered 2018-06-30: 1000 mL

## 2018-06-30 MED ORDER — SODIUM CHLORIDE 0.9 % IV SOLN
INTRAVENOUS | Status: DC | PRN
  Administered 2018-06-30: 500 mL

## 2018-06-30 MED ORDER — ROCURONIUM BROMIDE 50 MG/5ML IV SOSY
PREFILLED_SYRINGE | INTRAVENOUS | Status: DC | PRN
  Administered 2018-06-30: 50 mg via INTRAVENOUS

## 2018-06-30 MED ORDER — ALBUMIN HUMAN 5 % IV SOLN
INTRAVENOUS | Status: DC | PRN
  Administered 2018-06-30 (×2): via INTRAVENOUS

## 2018-06-30 MED ORDER — ONDANSETRON HCL 4 MG/2ML IJ SOLN
INTRAMUSCULAR | Status: AC
  Filled 2018-06-30: qty 2

## 2018-06-30 MED ORDER — SODIUM CHLORIDE 0.9 % IV SOLN
INTRAVENOUS | Status: DC | PRN
  Administered 2018-06-30: 50 ug/min via INTRAVENOUS

## 2018-06-30 MED ORDER — ONDANSETRON HCL 4 MG/2ML IJ SOLN
INTRAMUSCULAR | Status: DC | PRN
  Administered 2018-06-30: 4 mg via INTRAVENOUS

## 2018-06-30 MED ORDER — OXYCODONE HCL 5 MG PO TABS: 5.0000 mg | ORAL_TABLET | Freq: Once | ORAL | Status: DC | PRN

## 2018-06-30 MED ORDER — FENTANYL CITRATE (PF) 250 MCG/5ML IJ SOLN
INTRAMUSCULAR | Status: AC
  Filled 2018-06-30: qty 5

## 2018-06-30 MED ORDER — MIDAZOLAM HCL 5 MG/5ML IJ SOLN
INTRAMUSCULAR | Status: DC | PRN
  Administered 2018-06-30: 2 mg via INTRAVENOUS

## 2018-06-30 MED ORDER — EPHEDRINE SULFATE-NACL 50-0.9 MG/10ML-% IV SOSY
PREFILLED_SYRINGE | INTRAVENOUS | Status: DC | PRN
  Administered 2018-06-30 (×2): 10 mg via INTRAVENOUS

## 2018-06-30 MED ORDER — PROPOFOL 10 MG/ML IV BOLUS
INTRAVENOUS | Status: DC | PRN
  Administered 2018-06-30: 60 mg via INTRAVENOUS

## 2018-06-30 MED ORDER — SUCCINYLCHOLINE CHLORIDE 20 MG/ML IJ SOLN
INTRAMUSCULAR | Status: DC | PRN
  Administered 2018-06-30: 120 mg via INTRAVENOUS

## 2018-06-30 MED ORDER — LIDOCAINE 2% (20 MG/ML) 5 ML SYRINGE
INTRAMUSCULAR | Status: DC | PRN
  Administered 2018-06-30: 60 mg via INTRAVENOUS

## 2018-06-30 MED ORDER — SUGAMMADEX SODIUM 200 MG/2ML IV SOLN
INTRAVENOUS | Status: DC | PRN
  Administered 2018-06-30: 160 mg via INTRAVENOUS

## 2018-06-30 MED ORDER — FENTANYL CITRATE (PF) 100 MCG/2ML IJ SOLN
INTRAMUSCULAR | Status: AC
  Filled 2018-06-30: qty 2

## 2018-06-30 MED ORDER — SODIUM CHLORIDE 0.9 % IV SOLN
INTRAVENOUS | Status: AC
  Filled 2018-06-30: qty 1.2

## 2018-06-30 MED ORDER — SUCCINYLCHOLINE CHLORIDE 200 MG/10ML IV SOSY
PREFILLED_SYRINGE | INTRAVENOUS | Status: AC
  Filled 2018-06-30: qty 10

## 2018-06-30 SURGICAL SUPPLY — 37 items
CANISTER SUCT 3000ML PPV (MISCELLANEOUS) ×3 IMPLANT
CANISTER WOUND CARE 500ML ATS (WOUND CARE) ×3 IMPLANT
COVER SURGICAL LIGHT HANDLE (MISCELLANEOUS) ×3 IMPLANT
COVER WAND RF STERILE (DRAPES) ×3 IMPLANT
DRAPE INCISE IOBAN 66X45 STRL (DRAPES) ×6 IMPLANT
DRSG VAC ATS SM SENSATRAC (GAUZE/BANDAGES/DRESSINGS) ×6 IMPLANT
ELECT REM PT RETURN 9FT ADLT (ELECTROSURGICAL) ×3
ELECTRODE REM PT RTRN 9FT ADLT (ELECTROSURGICAL) ×1 IMPLANT
GAUZE SPONGE 4X4 12PLY STRL LF (GAUZE/BANDAGES/DRESSINGS) ×3 IMPLANT
GAUZE SPONGE 4X4 16PLY XRAY LF (GAUZE/BANDAGES/DRESSINGS) ×3 IMPLANT
GLOVE BIO SURGEON STRL SZ 6.5 (GLOVE) ×4 IMPLANT
GLOVE BIO SURGEON STRL SZ7.5 (GLOVE) ×6 IMPLANT
GLOVE BIO SURGEONS STRL SZ 6.5 (GLOVE) ×2
GLOVE BIOGEL PI IND STRL 8 (GLOVE) ×2 IMPLANT
GLOVE BIOGEL PI INDICATOR 8 (GLOVE) ×4
GLOVE SURG SS PI 7.5 STRL IVOR (GLOVE) ×3 IMPLANT
GOWN STRL REUS W/ TWL LRG LVL3 (GOWN DISPOSABLE) ×3 IMPLANT
GOWN STRL REUS W/ TWL XL LVL3 (GOWN DISPOSABLE) ×3 IMPLANT
GOWN STRL REUS W/TWL LRG LVL3 (GOWN DISPOSABLE) ×6
GOWN STRL REUS W/TWL XL LVL3 (GOWN DISPOSABLE) ×6
KIT BASIN OR (CUSTOM PROCEDURE TRAY) ×3 IMPLANT
KIT TURNOVER KIT B (KITS) ×3 IMPLANT
NS IRRIG 1000ML POUR BTL (IV SOLUTION) ×6 IMPLANT
PACK GENERAL/GYN (CUSTOM PROCEDURE TRAY) ×3 IMPLANT
PACK UNIVERSAL I (CUSTOM PROCEDURE TRAY) ×3 IMPLANT
PAD ARMBOARD 7.5X6 YLW CONV (MISCELLANEOUS) ×6 IMPLANT
SUT PROLENE 5 0 C 1 24 (SUTURE) ×9 IMPLANT
SUT PROLENE 6 0 BV (SUTURE) ×3 IMPLANT
SUT SILK 2 0 (SUTURE) ×2
SUT SILK 2-0 18XBRD TIE 12 (SUTURE) ×1 IMPLANT
SUT VIC AB 2-0 CT1 27 (SUTURE) ×6
SUT VIC AB 2-0 CT1 TAPERPNT 27 (SUTURE) ×3 IMPLANT
SUT VIC AB 2-0 SH 27 (SUTURE) ×2
SUT VIC AB 2-0 SH 27XBRD (SUTURE) ×1 IMPLANT
TAPE CLOTH SURG 4X10 WHT LF (GAUZE/BANDAGES/DRESSINGS) ×3 IMPLANT
TOWEL GREEN STERILE (TOWEL DISPOSABLE) ×3 IMPLANT
WATER STERILE IRR 1000ML POUR (IV SOLUTION) ×6 IMPLANT

## 2018-06-30 NOTE — Progress Notes (Signed)
OT Cancellation Note  Patient Details Name: Jose Sellers MRN: [id number] DOB: 01-31-1961   Cancelled Treatment:    Reason Eval/Treat Not Completed: Patient at procedure or test/ unavailable(HD. Will return as schedule allows. Thank you.)  [place], OTR/L Acute Rehab Pager: [phone] Office: [phone] 07/01/2018, 7:32 AM

## 2018-06-30 NOTE — Transfer of Care (Signed)
Immediate Anesthesia Transfer of Care Note  Patient: Jose Sellers  Procedure(s) Performed: REPAIR OF LEFT COMMON FEMORAL ARTERY; LEFT SARTORIOUS ROTATIONAL MUSCLE FLAP; APPLICATION OF NEGATIVE PRESSURE WOUND VAC LEFT GROIN (Left )  Patient Location: PACU  Anesthesia Type:General  Level of Consciousness: drowsy and patient cooperative  Airway & Oxygen Therapy: Patient Spontanous Breathing  Post-op Assessment: Report given to RN and Post -op Vital signs reviewed and stable  Post vital signs: Reviewed and stable  Last Vitals:  Vitals Value Taken Time  BP 107/55 07/01/2018  6:38 PM  Temp    Pulse 59 07/17/2018  6:40 PM  Resp 17 07/21/2018  6:40 PM  SpO2 99 % 07/17/2018  6:40 PM  Vitals shown include unvalidated device data.  Last Pain:  Vitals:   06/27/2018 1350  TempSrc:   PainSc: 9          Complications: No apparent anesthesia complications

## 2018-06-30 NOTE — Progress Notes (Addendum)
Physical Therapy Treatment Patient Details Name: Jose Sellers MRN: [id number] DOB: January 09, 1961 Today's Date: 07/23/2018    History of Present Illness Patient is 58 y/o male with PMH significant for CAD, CHF, COPD, ESRD on HD MWF, HTN, DMII, AICD, PAD, afib. Pt s/p left fem pop bypass and subsequent infection.  He had graft removal and vein patch of left femoral artery by Dr Donnetta Hutching 06/16/2018.  He developed bleeding from the left groin and was transferred from [facility] ER. Pt underwent patch angioplasty L common fem artery on 06/01/2018.  Pt experienced bleeding while ambulating with PT of 06/29/2018, he was then taken to OR for exploration of L groin with L femoral artery repair.     PT Comments    Pt sitting on edge of bed on arrival.  Pt agreeable to gait training.  He reports feeling weak from dialysis so focus on gait training in halls.  Pt ambulated 40 feet and after completing turn to head back to his room, his wound vac began to fill with blood and began dripping onto the floor followed by increased flow of blood for L femoral site.  PTA called for assistance and chair brought to patient and pressure applied to wound.  Multiple staff members assisted to fireman carry patient from recliner to bed.  RN reports patient will head to surgery shortly.  Will f/u after surgery for d/c planning.    Follow Up Recommendations  Home health PT;Supervision/Assistance - 24 hour     Equipment Recommendations  None recommended by PT    Recommendations for Other Services       Precautions / Restrictions Precautions Precautions: Fall Precaution Comments: woudn vac L groin Restrictions Weight Bearing Restrictions: No    Mobility  Bed Mobility               General bed mobility comments: Pt seated on EOB on arrival.    Transfers Overall transfer level: Needs assistance Equipment used: Rolling walker (2 wheeled) Transfers: Sit to/from Stand Sit to Stand: Min guard         General  transfer comment: Cues for hand placement to and from seated surface.  To return to chair patient required min assistance +2 as he was bleeding from L groin site.    Ambulation/Gait Ambulation/Gait assistance: Min guard Gait Distance (Feet): 40 Feet Assistive device: Rolling walker (2 wheeled) Gait Pattern/deviations: Step-through pattern;Trunk flexed Gait velocity: decreased   General Gait Details: Cues for upper trunk control.  Pt required assistance to turn RW.     Stairs             Wheelchair Mobility    Modified Rankin (Stroke Patients Only)       Balance Overall balance assessment: Needs assistance   Sitting balance-Leahy Scale: Good       Standing balance-Leahy Scale: Fair                              Cognition Arousal/Alertness: Awake/alert Behavior During Therapy: WFL for tasks assessed/performed Overall Cognitive Status: Within Functional Limits for tasks assessed                                        Exercises      General Comments        Pertinent Vitals/Pain Pain Assessment: Faces Faces Pain Scale: No hurt  Home Living                      Prior Function            PT Goals (current goals can now be found in the care plan section) Acute Rehab PT Goals Patient Stated Goal: None stated Progress towards PT goals: Progressing toward goals    Frequency    Min 3X/week      PT Plan Current plan remains appropriate    Co-evaluation              AM-PAC PT "6 Clicks" Mobility   Outcome Measure  Help needed turning from your back to your side while in a flat bed without using bedrails?: None Help needed moving from lying on your back to sitting on the side of a flat bed without using bedrails?: None Help needed moving to and from a bed to a chair (including a wheelchair)?: A Little Help needed standing up from a chair using your arms (e.g., wheelchair or bedside chair)?: A Little Help  needed to walk in hospital room?: A Little Help needed climbing 3-5 steps with a railing? : A Little 6 Click Score: 20    End of Session Equipment Utilized During Treatment: Gait belt Activity Tolerance: Patient limited by fatigue Patient left: with call bell/phone within reach;in chair Nurse Communication: Mobility status PT Visit Diagnosis: Other abnormalities of gait and mobility (R26.89);Muscle weakness (generalized) (M62.81);Difficulty in walking, not elsewhere classified (R26.2);History of falling (Z91.81)     Time: 6314-9702 PT Time Calculation (min) (ACUTE ONLY): 24 min  Charges:  $Gait Training: 8-22 mins $Therapeutic Activity: 8-22 mins                     Jose Sellers, PTA Acute Rehabilitation Services Pager [phone] Office [phone]     Jose Sellers Jose Sellers 07/10/2018, 4:45 PM

## 2018-06-30 NOTE — Progress Notes (Signed)
[facility] Progress Note  Subjective: Subjective: Hb up to 8.0 today w/ vol removal.  3.5 L off on HD today. 78kg post.   Vitals:   07/17/2018 1000 07/04/2018 1030 07/02/2018 1043 07/11/2018 1138  BP: (!) 102/53 (!) 114/55 (!) 115/57 113/60  Pulse: 64 60 60 60  Resp:   17 17  Temp:   97.7 F (36.5 C) 97.8 F (36.6 C)  TempSrc:   Oral Oral  SpO2:   97% 95%  Weight:   78.2 kg   Height:        Inpatient medications: . acidophilus  1 capsule Oral Daily  . amiodarone  200 mg Oral BID  . aspirin EC  81 mg Oral Daily  . atorvastatin  40 mg Oral BID  . calcitRIOL  0.5 mcg Oral Q M,W,F-HD  . Chlorhexidine Gluconate Cloth  6 each Topical Q0600  . Chlorhexidine Gluconate Cloth  6 each Topical Q0600  . [START ON 07/06/2018] darbepoetin (ARANESP) injection - NON-DIALYSIS  100 mcg Subcutaneous Q Fri-1800  . docusate sodium  100 mg Oral Daily  . feeding supplement (PRO-STAT SUGAR FREE 64)  30 mL Oral BID  . ferric citrate  420 mg Oral TID WC  . FLUoxetine  20 mg Oral Daily  . gabapentin  100 mg Oral TID  . insulin aspart  0-9 Units Subcutaneous TID WC  . levalbuterol  0.63 mg Inhalation BID  . metoprolol tartrate  12.5 mg Oral BID  . midodrine  10 mg Oral BID WC  . multivitamin  1 tablet Oral QHS  . pantoprazole  40 mg Oral Daily  . vancomycin  125 mg Oral Q6H   .  ceFAZolin (ANCEF) IV 2 g (06/29/18 1027)  . magnesium sulfate 1 - 4 g bolus IVPB    . vancomycin 1,000 mg (06/29/18 0923)   acetaminophen **OR** acetaminophen, bisacodyl, guaiFENesin-dextromethorphan, HYDROmorphone (DILAUDID) injection, labetalol, lidocaine (PF), magnesium sulfate 1 - 4 g bolus IVPB, nitroGLYCERIN, ondansetron, oxycodone, oxyCODONE-acetaminophen, phenol, polyethylene glycol Exam: General:  NAD chronically ill appearing Lungs: CTA bilaterally  Heart: RRR with S1 S2. No jvd Abdomen: soft NT + BS Lower extremities: left leg groin incision in tact, slight opening of wound no pus or odor. Bilat  worsneing 2+ edema both LE's.  Neuro: A & O  X 3. nonfocal Dialysis Access: right lower AVF + bruit  Right IJ TDC    home meds:  - esomeprazole 40 qd/ ferric citrate ac  - aspirin 81/ atorvastatin 40 bid/ sl ntg prn  - amiodarone 200 bid/ metoprolol 12.5 bid  - fluoxetine 20 qd/ gabapentin 300 tid/ oxycodone IR prn  - insulin glargine 8 u pm/ insulin lispro SSI tid ac  - tiotropium 18 ug qd/ levalbuterol bid nebs  - prn's/ vitamins/ supplements  [place] MWF  4h 350/1.5 78kg 2/2.25 bath P2 R AVF/ TDC still in Hep none Venofer 100 mg IV x 2 no ESA Calcitriol 0.5 mcg PO TIW  Assessment/Plan: 1. Bleeding L groin wound / infection: pt is sp left fem pop bypass then subsequent infection w/ graft removal and vein patch around 3/20. Admit w/ bleeding L groin wound, went to OR 3/30 for repair of bleeding by VVS.  Getting IV abx for +klebs oxytoca this admit from groin wound and for +mrsa from last admit also from wound 2. ESRD -  MWF HD. We used AVF 4 of 4 sessions on last admit. TDC was removed yesterday by IR, appreciate assistance.  3. Volume overload -  noncompliance w/ fluid restriction here. Extra HD Sat (today) for volume. Down to edw but needs sig dry wt lowering as stable wet on exam.  4. ABLA - s/p 3 units PRBC. Started darbe 100ug on 4/3 here and weekly. Starting 1gm IV Fe load for tsat 12%. Hb 8.0.  5. Metabolic bone disease -  Calcitriol/binders 6.  Nutrition - renal diet/vits/add suppelments alb low 7. COPD - home O2 8. Chr systolic CHF EF 29-51% EF  9. Chronic afib - on amio/BB and elliquis previously 10. Neuropathy - reduced neurontin to 100 tid (ESRD appropriate dose) from 300 tid  11. MRSA contact precautions 12. DM -per primary 13. Left heel ulcer - follow    [facility] 07/03/2018, 3:21 PM  Iron/TIBC/Ferritin/ %Sat    Component Value Date/Time   IRON 22 (L) 06/29/2018 0123   TIBC 182 (L) 06/29/2018 0123   IRONPCTSAT 12 (L)  06/29/2018 0123   Recent Labs  Lab 06/11/2018 1939  06/28/2018 0732  NA  --    < > 131*  K  --    < > 4.0  CL  --    < > 95*  CO2  --    < > 27  GLUCOSE  --    < > 125*  BUN  --    < > 24*  CREATININE  --    < > 3.81*  CALCIUM  --    < > 8.4*  PHOS  --    < > 2.7  ALBUMIN  --    < > 2.0*  INR 1.5*  --   --    < > = values in this interval not displayed.   No results for input(s): AST, ALT, ALKPHOS, BILITOT, PROT in the last 168 hours. Recent Labs  Lab 07/25/2018 0731  WBC 9.0  HGB 8.0*  HCT 26.0*  PLT 255

## 2018-06-30 NOTE — Op Note (Addendum)
Date: June 30, 2018  Preoperative diagnosis: Hemorrhage from left groin common femoral artery patch  Postoperative diagnosis: Same  Procedure: 1.  Left common femoral artery repair  2.  Left sartorius rotational muscle flap 3.  19 French round Blake drain in left sartorius space 4.  Negative pressure wound VAC placement  Surgeon: Dr. Marty Heck, MD  Assistant: Leontine Locket, PA  Indications: Patient is a 58 year old male who has had a complex intervention including a left dacron patch angioplasty of the common femoral artery with femoral to above-knee popliteal bypass with prosthetic.  Ultimately this was infected and was excised and a vein patch was placed.  The vein patch subsequently degenerated and a bovine pericardial patch was placed earlier this week.  I was called this evening by nursing after patient was up walking and had pulsatile bleeding from his left groin.  Ultimately he was taken to the OR emergently for left groin exploration.  Findings: The patch had one focal area of hemorrhage on the lateral margin proximally.  It was controlled with one single 5-0 Prolene in figure of eight fashion.  This was reinforced with several additional 5-0 Prolene sutures.  The rest of the patch appeared intact and there was no other evidence of active hemorrhage.  The common femoral artery was very tenuous and friable.  Ultimately there was no good soft tissue coverage in the wound bed and a rotational sartorius muscle flap was placed over the artery.  It should be noted that his sartorius was very small and atrophied and there was significant inflammation in the left groin that made dissection difficult.  Details: Patient was taken to the operating room without written consent given that this was an emergency case.  I did discuss with the patient as well as his wife on the phone prior to taking him emergently to the OR.  Ultimately his left groin was prepped and draped in usual sterile  fashion after general anesthesia was induced.  We initially opened up the sutures in the left groin that was closed in several layers.  Underneath this we identified a bovine pericardial patch and there was a focal area of hemorrhage on the lateral margin of the patch proximally near the inguinal ligament.  Initially there was brisk pulsatile bleeding from the wound and we used a #4 Fogarty balloon that was inserted through the pulsatile bleed in the patch and this was inflated to get proximal control in the distal left external iliac artery.  Once we had reasonable control, I then placed a 5-0 Prolene in figure-of-eight fashion and the balloon was deflated and we appeared to have hemostasis.  I then reinforced this with several additional 5-0 Prolene in interrupted fashion.  I debated dissecting out a piece of superficial femoral artery and re-patching his common femoral versus finding vein, but I was concerned that the artery appeared very friable and tenuous and this was a fourth time reexploration.  Given that we had hemostasis instead I elected to try and get some additional tissue coverage.  The subcutaneous tissue was very woody and edematous and really would not close very nicely over top of the artery.  We did find a very small sartorius and ultimately this was mobilized laterally and posteriorly and then the tendon was transected from the ASIS.  We preserved it medially to protect the blood supply.  We then performed a rotation of this on to the top of the femoral artery patch.  This was secured with multiple  3-0 Vicryl's to the soft tissue medially as well as to the inguinal ligament.  The wound was then copiously irrigated with saline until effluent was clear.  A 19 French round drain was placed into the sartorius muscle bed laterally up toward the ASIS.  We could only closed one additional layer of soft tissue over the artery with 3-0 Vicryl in interrupted fashion.  A negative pressure wound VAC was  placed and skin was left open.  Condition: Stable  Complications: None  EBL: 150 mL   Marty Heck, MD [facility] [place] Office: [phone] Pager: [place]

## 2018-06-30 NOTE — Progress Notes (Signed)
07/18/2018 1615 Pt to OR. [place]

## 2018-06-30 NOTE — Significant Event (Signed)
Rapid Response Event Note  Overview: Time Called: 1524 Arrival Time: 1524 Event Type: Other (Comment)  Initial Focused Assessment: Patient walking in the hall with therapy when his femoral site suddenly began to bleed profusely.  Vac container full and large amount of bright red blood on the floor.  Patient placed in chair.  Manual pressure applied, bleeding stopped.  Patient fully alert and oriented.   BP 98/63  HR 74  RR 20  O2 sat 96%  Interventions: Manual pressure Dr Carlis Abbott at bedside to assess patient Patient spoke with wife. Assisted to OR   Plan of Care (if not transferred):  Event Summary: Name of Physician Notified: Dr Carlis Abbott at 1525    at       Event End Time: [place]  Raliegh Ip

## 2018-06-30 NOTE — Progress Notes (Signed)
07/21/2018 1540 PT called out for help, was walking pt and he began to bleed from L groin site.  Pt placed in chair in hallway and pressure immediately held at site.  Got pt back in room and in the bed with continuous pressure being held.  Rapid nurse on floor seeing another pt and came in to help.  Dr. Carlis Abbott notified of pt's condition.   [place]

## 2018-06-30 NOTE — Anesthesia Procedure Notes (Signed)
Procedure Name: Intubation Date/Time: 07/22/2018 4:15 PM Performed by: Lance Coon, CRNA Pre-anesthesia Checklist: Patient identified, Suction available, Timeout performed, Emergency Drugs available and Patient being monitored Patient Re-evaluated:Patient Re-evaluated prior to induction Oxygen Delivery Method: Circle system utilized Preoxygenation: Pre-oxygenation with 100% oxygen Induction Type: IV induction and Rapid sequence Laryngoscope Size: Miller and 3 Grade View: Grade I Tube type: Oral Tube size: 7.5 mm Number of attempts: 1 Airway Equipment and Method: Stylet Placement Confirmation: ETT inserted through vocal cords under direct vision,  positive ETCO2 and breath sounds checked- equal and bilateral Secured at: 21 cm Tube secured with: Tape Dental Injury: Teeth and Oropharynx as per pre-operative assessment

## 2018-06-30 NOTE — Progress Notes (Signed)
07/02/2018 1550 Dr. Carlis Abbott in to see pt.  We have stopped the bleeding, will continue to hold pressure until team comes to take pt to OR. [place]

## 2018-06-30 NOTE — Anesthesia Preprocedure Evaluation (Addendum)
Anesthesia Evaluation  Patient identified by MRN, date of birth, ID band Patient awake    Reviewed: Allergy & Precautions, Patient's Chart, lab work & pertinent test results, Unable to perform ROS - Chart review onlyPreop documentation limited or incomplete due to emergent nature of procedure.  History of Anesthesia Complications Negative for: history of anesthetic complications  Airway Mallampati: II  TM Distance: >3 FB Neck ROM: Full    Dental  (+) Edentulous Upper   Pulmonary asthma , sleep apnea , COPD, Current Smoker,    breath sounds clear to auscultation       Cardiovascular hypertension, Pt. on home beta blockers and Pt. on medications + CAD, + Past MI, + Cardiac Stents, + CABG, + Peripheral Vascular Disease and +CHF  + dysrhythmias Atrial Fibrillation + Cardiac Defibrillator  Rhythm:Regular Rate:Normal   '19 TTE (Care Everywhere) - Mild left ventricular hypertrophy. The left ventricle is moderately dilated.  LV ejection fraction = 25-30%. Left ventricular filling pattern is prolonged relaxation. There is severe global hypokinesis of the left ventricle. LA is mildly dilated. Mild TR. No pulmonary hypertension. There is a pleural effusion present.     Neuro/Psych PSYCHIATRIC DISORDERS Anxiety Depression negative neurological ROS     GI/Hepatic Neg liver ROS, hiatal hernia, GERD  Medicated,  Endo/Other  diabetes, Insulin DependentHypothyroidism  Hyponatremia  Renal/GU ESRF and DialysisRenal disease     Musculoskeletal  (+) Arthritis ,   Abdominal   Peds  Hematology  (+) anemia ,   Anesthesia Other Findings   Reproductive/Obstetrics                            Anesthesia Physical Anesthesia Plan  ASA: III and emergent  Anesthesia Plan: General   Post-op Pain Management:    Induction: Intravenous  PONV Risk Score and Plan: 2 and Treatment may vary due to age or medical  condition, Ondansetron and Midazolam  Airway Management Planned: Oral ETT  Additional Equipment: None  Intra-op Plan:   Post-operative Plan: Extubation in OR  Informed Consent:     Only emergency history available and History available from chart only  Plan Discussed with: CRNA and Anesthesiologist  Anesthesia Plan Comments:        Anesthesia Quick Evaluation

## 2018-06-30 NOTE — Progress Notes (Signed)
Pharmacy Antibiotic Note  Jose Sellers is a 58 y.o. male admitted on 06/12/2018 with groin wound infection (s/p left fem pop bypass and subsequent infection).  Pharmacy has been consulted for vancomycin dosing.  He is noted with ESRD with last HD yesterday.   He had a recent MRSA infection (infected bypass) and was on vancomycin with last dose on 06/24/17 at HD.  -wound cultures this admission show klebsiella oxytoca (sens to ancef)  Plans are to continue vancomycin and will need coverage for klebsiella     Plan: Continue vancomycin 1g IV QHD-MWF Continue Ancef 2gmIV MWF with HD,  F/u LOT  Height: 6' (182.9 cm) Weight: 180 lb 8.9 oz (81.9 kg) IBW/kg (Calculated) : 77.6  Temp (24hrs), Avg:98 F (36.7 C), Min:97.5 F (36.4 C), Max:98.7 F (37.1 C)  Recent Labs  Lab 06/21/2018 1525 06/26/18 0259 06/26/18 0954 06/27/18 0729 06/27/18 0730 06/28/18 0349 06/29/18 0721 06/29/18 0722 07/11/2018 0731 07/02/2018 0732  WBC 11.5* 10.5 13.5*  --  7.6 10.1 9.7  --  9.0  --   CREATININE 3.35* 3.80*  --  4.42*  --   --   --  4.91*  --  3.81*  VANCORANDOM  --   --   --  21  --   --   --   --   --   --     Estimated Creatinine Clearance: 23.8 mL/min (A) (by C-G formula based on SCr of 3.81 mg/dL (H)).    Allergies  Allergen Reactions  . Albuterol Palpitations    SVT  . Prednisone Shortness Of Breath    Wife stated that patient develops breathing issues.  . Fluticasone Furoate-Vilanterol Palpitations  . Morphine Rash    Lea Walbert A. Levada Dy, PharmD, [place] Please utilize Amion for appropriate phone number to reach the unit pharmacist ([place])

## 2018-06-30 NOTE — Anesthesia Procedure Notes (Signed)
Arterial Line Insertion Start/End04/25/2020 4:21 PM, 06/30/2018 4:33 PM Performed by: Audry Pili, MD, anesthesiologist  Patient location: Pre-op. Preanesthetic checklist: patient identified, IV checked, risks and benefits discussed, surgical consent, monitors and equipment checked, pre-op evaluation, timeout performed and anesthesia consent Lidocaine 1% used for infiltration and patient sedated Left, radial was placed Catheter size: 20 G Hand hygiene performed   Attempts: 3 (Previous attempts by CRNA unsuccessful) Procedure performed using ultrasound guided technique. Ultrasound Notes:anatomy identified, needle tip was noted to be adjacent to the nerve/plexus identified, no ultrasound evidence of intravascular and/or intraneural injection and image(s) printed for medical record Following insertion, dressing applied and Biopatch. Post procedure assessment: unchanged and normal  Patient tolerated the procedure well with no immediate complications.

## 2018-06-30 NOTE — Progress Notes (Signed)
[facility] [place]  Subjective  - Vac to left groin yesterday.  Seen in dialysis.   Objective (!) 118/58 60 (!) 97.5 F (36.4 C) (Oral) 14 96%  Intake/Output Summary (Last 24 hours) at 07/16/2018 0847 Last data filed at 06/29/2018 2300 Gross per 24 hour  Intake 826.47 ml  Output 2500 ml  Net -1673.53 ml   Gen: NAD Left groin with vac - serosanguinous drainage in cannister Medial thigh wet to dry dressing - c/d Doppler signal AT left, good active range of motion of left LE   Assessment/Planning:  58 y.o. male is s/p left common femoral endarterectomy and femoropopliteal bypass now excised with patch angioplasty of his left CFA with bovine pericardium and the above-knee remains with vein patch. Continuevanc/ancef for MRSA and Klebsiella on wound cultures.  Moderate SS/Lymph drainage from left groin.  Left groin vac placed yesterday - output 50 mL or so.  Likely keep through weekend to ensure no disruption of patch (high risk) and for vac change on Monday.   WBC, Hgb stable.  HD for ESRD.  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  Marty Heck 07/23/2018 8:47 AM --  Laboratory Lab Results: Recent Labs    06/29/18 0721 07/01/2018 0731  WBC 9.7 9.0  HGB 7.5* 8.0*  HCT 24.2* 26.0*  PLT 229 255   BMET Recent Labs    06/29/18 0722 07/25/2018 0732  NA 126* 131*  K 4.5 4.0  CL 90* 95*  CO2 23 27  GLUCOSE 101* 125*  BUN 35* 24*  CREATININE 4.91* 3.81*  CALCIUM 8.5* 8.4*    COAG Lab Results  Component Value Date   INR 1.5 (H) 05/28/2018   INR 1.23 05/01/2018   INR 1.08 02/13/2018   No results found for: PTT   Marty Heck, MD [facility] [place] Office: [phone] Pager: [place]

## 2018-06-30 NOTE — Anesthesia Postprocedure Evaluation (Signed)
Anesthesia Post Note  Patient: Jose Sellers  Procedure(s) Performed: REPAIR OF LEFT COMMON FEMORAL ARTERY; LEFT SARTORIOUS ROTATIONAL MUSCLE FLAP; APPLICATION OF NEGATIVE PRESSURE WOUND VAC LEFT GROIN (Left )     Patient location during evaluation: PACU Anesthesia Type: General Level of consciousness: awake and alert Pain management: pain level controlled Vital Signs Assessment: post-procedure vital signs reviewed and stable Respiratory status: spontaneous breathing, nonlabored ventilation, respiratory function stable and patient connected to nasal cannula oxygen Cardiovascular status: blood pressure returned to baseline and stable Postop Assessment: no apparent nausea or vomiting Anesthetic complications: no    Last Vitals:  Vitals:   07/09/2018 1945 07/20/2018 2028  BP: (!) 104/53 (!) 107/54  Pulse: (!) 58 70  Resp: 11 12  Temp:  (!) 36.4 C  SpO2: 99% 98%    Last Pain:  Vitals:   07/26/2018 2028  TempSrc: Oral  PainSc:                  [name] S

## 2018-06-30 NOTE — Progress Notes (Addendum)
58 year old male that underwent left femoral to above-knee popliteal bypass with prosthetic as well as left common femoral endarterectomy with Dacron by Dr. Donnetta Hutching that got infected and required excision with a vein patch.  Ultimately his vein patch disrupted and he was taken to the OR on Monday by Dr. Oneida Alar and had a bovine patch put in.  I was called this afternoon by nursing who while he was up walking he had profuse bleeding from his left groin.  Have to suspect that he has disrupted his patch again.  We will post him emergently to go the OR for left groin exploration and common femoral artery repair.  I discussed with his wife on the phone that he is exceedingly high risk for limb loss and this is a difficult situation given now his 3rd trip to the OR.  Marty Heck, MD [facility] [place] Office: [phone] Pager: [place]

## 2018-07-01 ENCOUNTER — Encounter (HOSPITAL_COMMUNITY): Payer: Self-pay | Admitting: Vascular Surgery

## 2018-07-01 LAB — POCT I-STAT 4, (NA,K, GLUC, HGB,HCT)
Glucose, Bld: 123 mg/dL — ABNORMAL HIGH (ref 70–99)
HCT: 22 % — ABNORMAL LOW (ref 39.0–52.0)
Hemoglobin: 7.5 g/dL — ABNORMAL LOW (ref 13.0–17.0)
Potassium: 3.7 mmol/L (ref 3.5–5.1)
Sodium: 135 mmol/L (ref 135–145)

## 2018-07-01 LAB — POCT I-STAT 7, (LYTES, BLD GAS, ICA,H+H)
Acid-Base Excess: 2 mmol/L (ref 0.0–2.0)
Bicarbonate: 28.5 mmol/L — ABNORMAL HIGH (ref 20.0–28.0)
Calcium, Ion: 1.13 mmol/L — ABNORMAL LOW (ref 1.15–1.40)
HCT: 21 % — ABNORMAL LOW (ref 39.0–52.0)
Hemoglobin: 7.1 g/dL — ABNORMAL LOW (ref 13.0–17.0)
O2 Saturation: 99 %
Potassium: 3.5 mmol/L (ref 3.5–5.1)
Sodium: 133 mmol/L — ABNORMAL LOW (ref 135–145)
TCO2: 30 mmol/L (ref 22–32)
pCO2 arterial: 53.6 mmHg — ABNORMAL HIGH (ref 32.0–48.0)
pH, Arterial: 7.334 — ABNORMAL LOW (ref 7.350–7.450)
pO2, Arterial: 165 mmHg — ABNORMAL HIGH (ref 83.0–108.0)

## 2018-07-01 LAB — TYPE AND SCREEN
ABO/RH(D): O POS
Antibody Screen: NEGATIVE
Unit division: 0
Unit division: 0

## 2018-07-01 LAB — BPAM RBC
Blood Product Expiration Date: 202004092359
Blood Product Expiration Date: 202004232359
ISSUE DATE / TIME: 202004041752
ISSUE DATE / TIME: 202004041752
Unit Type and Rh: 5100
Unit Type and Rh: 9500

## 2018-07-01 LAB — CBC
HCT: 24 % — ABNORMAL LOW (ref 39.0–52.0)
Hemoglobin: 7.6 g/dL — ABNORMAL LOW (ref 13.0–17.0)
MCH: 27.5 pg (ref 26.0–34.0)
MCHC: 31.7 g/dL (ref 30.0–36.0)
MCV: 87 fL (ref 80.0–100.0)
Platelets: 213 10*3/uL (ref 150–400)
RBC: 2.76 MIL/uL — ABNORMAL LOW (ref 4.22–5.81)
RDW: 16.5 % — ABNORMAL HIGH (ref 11.5–15.5)
WBC: 10.3 10*3/uL (ref 4.0–10.5)
nRBC: 0 % (ref 0.0–0.2)

## 2018-07-01 LAB — GLUCOSE, CAPILLARY
Glucose-Capillary: 89 mg/dL (ref 70–99)
Glucose-Capillary: 100 mg/dL — ABNORMAL HIGH (ref 70–99)
Glucose-Capillary: 95 mg/dL (ref 70–99)
Glucose-Capillary: 123 mg/dL — ABNORMAL HIGH (ref 70–99)

## 2018-07-01 MED ORDER — CHLORHEXIDINE GLUCONATE CLOTH 2 % EX PADS: 6.0000 | MEDICATED_PAD | Freq: Every day | CUTANEOUS | Status: DC

## 2018-07-01 NOTE — Progress Notes (Signed)
PT Cancellation Note  Patient Details Name: Jose Sellers MRN: [id number] DOB: February 02, 1961   Cancelled Treatment:    Reason Eval/Treat Not Completed: Active bedrest order   Noted in Dr. Ainsley Spinner note, bedrest for today;   Will hold today, and plan to check back tomorrow for appropriateness of mobilizing;   Roney Marion, [place] Pager [phone] Office [phone]    Colletta [place] 07/01/2018, 2:14 PM

## 2018-07-01 NOTE — Progress Notes (Signed)
OT Cancellation Note  Patient Details Name: Jose Sellers MRN: [id number] DOB: 1960/10/16   Cancelled Treatment:    Reason Eval/Treat Not Completed: Active bedrest order (Will return as schedule allows. Thank you.)  [place], OTR/L Acute Rehab Pager: [phone] Office: [phone] 07/01/2018, 2:19 PM

## 2018-07-01 NOTE — Progress Notes (Addendum)
[facility] Progress Note  Subjective: acute bleed from groin yest, went to OR for arterial repair. In room today, no new c/o's.    Vitals:   07/01/18 0354 07/01/18 0700 07/01/18 0754 07/01/18 0909  BP: 124/63 126/61    Pulse: 62 69    Resp: 12 (!) 21    Temp: 97.7 F (36.5 C)     TempSrc: Oral     SpO2: 100% 99% 100%   Weight: 82.8 kg   81.8 kg  Height:        Inpatient medications: . acidophilus  1 capsule Oral Daily  . amiodarone  200 mg Oral BID  . aspirin EC  81 mg Oral Daily  . atorvastatin  40 mg Oral BID  . calcitRIOL  0.5 mcg Oral Q M,W,F-HD  . Chlorhexidine Gluconate Cloth  6 each Topical Q0600  . Chlorhexidine Gluconate Cloth  6 each Topical Q0600  . [START ON 07/06/2018] darbepoetin (ARANESP) injection - NON-DIALYSIS  100 mcg Subcutaneous Q Fri-1800  . docusate sodium  100 mg Oral Daily  . feeding supplement (PRO-STAT SUGAR FREE 64)  30 mL Oral BID  . ferric citrate  420 mg Oral TID WC  . FLUoxetine  20 mg Oral Daily  . gabapentin  100 mg Oral TID  . insulin aspart  0-9 Units Subcutaneous TID WC  . levalbuterol  0.63 mg Inhalation BID  . metoprolol tartrate  12.5 mg Oral BID  . midodrine  10 mg Oral BID WC  . multivitamin  1 tablet Oral QHS  . pantoprazole  40 mg Oral Daily  . vancomycin  125 mg Oral Q6H   .  ceFAZolin (ANCEF) IV 2 g (06/29/18 1027)  . magnesium sulfate 1 - 4 g bolus IVPB    . vancomycin 1,000 mg (06/29/18 0923)   acetaminophen **OR** acetaminophen, bisacodyl, guaiFENesin-dextromethorphan, HYDROmorphone (DILAUDID) injection, labetalol, lidocaine (PF), magnesium sulfate 1 - 4 g bolus IVPB, nitroGLYCERIN, ondansetron, oxycodone, oxyCODONE-acetaminophen, phenol, polyethylene glycol Exam: General:  NAD chronically ill appearing Lungs: CTA bilaterally  Heart: RRR with S1 S2. No jvd Abdomen: soft NT + BS Lower extremities: left leg groin wound /w VAC in place and drain.  Bilat pitting LE edema, a little better.  Neuro: A & O  X  3. nonfocal Dialysis Access: right lower AVF + bruit  Right IJ TDC    home meds:  - esomeprazole 40 qd/ ferric citrate ac  - aspirin 81/ atorvastatin 40 bid/ sl ntg prn  - amiodarone 200 bid/ metoprolol 12.5 bid  - fluoxetine 20 qd/ gabapentin 300 tid/ oxycodone IR prn  - insulin glargine 8 u pm/ insulin lispro SSI tid ac  - tiotropium 18 ug qd/ levalbuterol bid nebs  - prn's/ vitamins/ supplements  [place] MWF  4h 350/1.5 78kg 2/2.25 bath P2 R AVF (TDC removed here)Hep none Venofer 100 mg IV x 2 no ESA Calcitriol 0.5 mcg PO TIW  Assessment/Plan: 1. Bleeding + infection L groin wound - pt is sp left fem pop bypass then subsequent infection w/ graft removal and vein patch around 3/20. Admit w/ bleeding L groin wound, went to OR 3/30 for repair of bleeding by VVS.  Getting IV abx for +klebs oxytoca this admit from groin wound and for +mrsa from last admit also from wound 2. ESRD -  MWF HD. AVF working well so TDC was dc'd 4/3 by IR.  3. Volume overload - noncompliance w/ fluid restriction here. +LE edema. Extra HD Sat for volume. Needs  dry wt lowering too. Cont lower vol on hd 4. ABLA - s/p 3 units PRBC. Started darbe 100ug on 4/3 here and weekly. Starting 1gm IV Fe load for tsat 12%.  Hb 7.6 sp 2u prbc yest and 3 other units this admit.  5. Metabolic bone disease -  Calcitriol/binders 6.  Nutrition - renal diet/vits/add suppelments alb low 7. COPD - home O2 8. Chr systolic CHF EF 43-32% EF  9. Chronic afib - on amio/BB and elliquis previously 10. Neuropathy - reduced neurontin to 100 tid (ESRD appropriate dose) from 300 tid  11. DM -per primary    [facility] 07/01/2018, 10:57 AM  Iron/TIBC/Ferritin/ %Sat    Component Value Date/Time   IRON 22 (L) 06/29/2018 0123   TIBC 182 (L) 06/29/2018 0123   IRONPCTSAT 12 (L) 06/29/2018 0123   Recent Labs  Lab 06/12/2018 1939  07/16/2018 0732 07/13/2018 1644  NA  --    < > 131* 134*  K  --    < > 4.0  3.4*  CL  --    < > 95* 100  CO2  --    < > 27 29  GLUCOSE  --    < > 125* 120*  BUN  --    < > 24* 16  CREATININE  --    < > 3.81* 2.72*  CALCIUM  --    < > 8.4* 7.6*  PHOS  --    < > 2.7  --   ALBUMIN  --    < > 2.0*  --   INR 1.5*  --   --   --    < > = values in this interval not displayed.   No results for input(s): AST, ALT, ALKPHOS, BILITOT, PROT in the last 168 hours. Recent Labs  Lab 07/01/18 0007  WBC 10.3  HGB 7.6*  HCT 24.0*  PLT 213

## 2018-07-01 NOTE — Progress Notes (Signed)
Pt received from PACU, AOx4. Breathing even and unlabored in 2l o2 via [place]. Vitals stable. Wound vac dressing to left groin intact. JP drain to left thigh intact.  Pt has blood running at 250ml/hr. Bilateral Dp pulses Doppler. CCMD aware.Call bell within reach. Will continue to monitor.

## 2018-07-01 NOTE — Progress Notes (Signed)
Called wife, Geraldo Pitter at [phone] to update her on patient's condition per pt and wife request.  All questions addressed and answered.

## 2018-07-01 NOTE — Progress Notes (Signed)
[facility] last night emergently for left groin bleed.  Hemostatic since OR.  2uPRBC and Hgb 6.8 to 7.6 this am.   Objective 126/61 69 97.7 F (36.5 C) (Oral) (!) 21 100%  Intake/Output Summary (Last 24 hours) at 07/01/2018 0816 Last data filed at 07/01/2018 0803 Gross per 24 hour  Intake 2682.33 ml  Output 4030 ml  Net -1347.67 ml    Left groin vac with good seal Left groin drain in sartorius muscle space - serosanguinous drainage Left toe gangrene dry  Laboratory Lab Results: Recent Labs    07/10/2018 1644 07/01/18 0007  WBC 11.5* 10.3  HGB 6.8* 7.6*  HCT 21.3* 24.0*  PLT 286 213   BMET Recent Labs    07/22/2018 0732 07/26/2018 1644  NA 131* 134*  K 4.0 3.4*  CL 95* 100  CO2 27 29  GLUCOSE 125* 120*  BUN 24* 16  CREATININE 3.81* 2.72*  CALCIUM 8.4* 7.6*    COAG Lab Results  Component Value Date   INR 1.5 (H) 06/16/2018   INR 1.23 05/01/2018   INR 1.08 02/13/2018   No results found for: PTT  Assessment/Planning:  POD#1 s/p left groin exploration for bleeding patch.  Keep vac to left groin, drain in sartorius space.  Bedrest today.   Continue vanc/ancef for MRSA and Klebsiella on wound cultures.  Artery is very tenuous and high risk for re-bleed given infected field.  Marty Heck 07/01/2018 8:16 AM --

## 2018-07-01 NOTE — Progress Notes (Signed)
Arterial line removed per order by Dr. Carlis Abbott, MD. Pt tolerated procedure well with no complications.  Wet to dry dressing changes performed on left thigh.

## 2018-07-01 NOTE — Progress Notes (Signed)
Patient has 6 beat run of accelerated idioventricular ryhthm at 1644, HR 100.  Pt is asymptomatic. Currently in NSR, HR 67, B/P 117/59(74).  Notified provider, Dr. Monica Martinez, MD (Vascular).  Provider made aware, continue to monitor.

## 2018-07-02 LAB — CBC
HCT: 23.8 % — ABNORMAL LOW (ref 39.0–52.0)
HCT: 26.6 % — ABNORMAL LOW (ref 39.0–52.0)
Hemoglobin: 7.3 g/dL — ABNORMAL LOW (ref 13.0–17.0)
Hemoglobin: 8.3 g/dL — ABNORMAL LOW (ref 13.0–17.0)
MCH: 28 pg (ref 26.0–34.0)
MCH: 28.2 pg (ref 26.0–34.0)
MCHC: 30.7 g/dL (ref 30.0–36.0)
MCHC: 31.2 g/dL (ref 30.0–36.0)
MCV: 91.2 fL (ref 80.0–100.0)
MCV: 90.5 fL (ref 80.0–100.0)
Platelets: 248 10*3/uL (ref 150–400)
Platelets: 264 10*3/uL (ref 150–400)
RBC: 2.61 MIL/uL — ABNORMAL LOW (ref 4.22–5.81)
RBC: 2.94 MIL/uL — ABNORMAL LOW (ref 4.22–5.81)
RDW: 17.9 % — ABNORMAL HIGH (ref 11.5–15.5)
RDW: 18.2 % — ABNORMAL HIGH (ref 11.5–15.5)
WBC: 11.6 10*3/uL — ABNORMAL HIGH (ref 4.0–10.5)
WBC: 13.8 10*3/uL — ABNORMAL HIGH (ref 4.0–10.5)
nRBC: 0 % (ref 0.0–0.2)
nRBC: 0 % (ref 0.0–0.2)

## 2018-07-02 LAB — RENAL FUNCTION PANEL
Albumin: 2 g/dL — ABNORMAL LOW (ref 3.5–5.0)
Anion gap: 10 (ref 5–15)
BUN: 28 mg/dL — ABNORMAL HIGH (ref 6–20)
CO2: 23 mmol/L (ref 22–32)
Calcium: 8.4 mg/dL — ABNORMAL LOW (ref 8.9–10.3)
Chloride: 96 mmol/L — ABNORMAL LOW (ref 98–111)
Creatinine, Ser: 4.64 mg/dL — ABNORMAL HIGH (ref 0.61–1.24)
GFR calc Af Amer: 15 mL/min — ABNORMAL LOW (ref >60)
GFR calc non Af Amer: 13 mL/min — ABNORMAL LOW (ref >60)
Glucose, Bld: 117 mg/dL — ABNORMAL HIGH (ref 70–99)
Phosphorus: 3.2 mg/dL (ref 2.5–4.6)
Potassium: 4.3 mmol/L (ref 3.5–5.1)
Sodium: 129 mmol/L — ABNORMAL LOW (ref 135–145)

## 2018-07-02 LAB — GLUCOSE, CAPILLARY
Glucose-Capillary: 86 mg/dL (ref 70–99)
Glucose-Capillary: 90 mg/dL (ref 70–99)
Glucose-Capillary: 88 mg/dL (ref 70–99)
Glucose-Capillary: 95 mg/dL (ref 70–99)

## 2018-07-02 MED ORDER — CALCITRIOL 0.5 MCG PO CAPS
ORAL_CAPSULE | ORAL | Status: AC
  Filled 2018-07-02: qty 1

## 2018-07-02 MED ORDER — VANCOMYCIN HCL IN DEXTROSE 1-5 GM/200ML-% IV SOLN
INTRAVENOUS | Status: AC
  Administered 2018-07-02: 1000 mg
  Filled 2018-07-02: qty 200

## 2018-07-02 MED ORDER — MIDODRINE HCL 5 MG PO TABS
ORAL_TABLET | ORAL | Status: AC
  Administered 2018-07-02: 10 mg via ORAL
  Filled 2018-07-02: qty 2

## 2018-07-02 NOTE — Consult Note (Signed)
[place] Nurse wound follow up Wound type: Surgical wound to left groin Measurement: 11 cm x 1.2 cm x 1 cm  Wound SAY:TKZSW red with 0.2 cm dark spot at 4 o'clock Drainage (amount, consistency, odor) Lymphatic drainage  Periwound:intact  JP drain in close proximity Dressing procedure/placement/frequency:Adhesive remover used to body hair and tender to touch.  Cleansed wound with NS.  Lymphatic drainage noted at proximal and distal end during care.  1 piece black foam placed.  Drape applied and seal achieved.  change M/W/F WOC team will follow.  Domenic Moras MSN, RN, FNP-BC CWON Wound, Ostomy, Continence Nurse Pager [phone]

## 2018-07-02 NOTE — Progress Notes (Addendum)
  Progress Note    07/02/2018 7:42 AM 2 Days Post-Op  Subjective:  No rest pain however L heel very sore per patient.  Patient transporter here to take patient to HD this morning   Vitals:   07/01/18 2359 07/02/18 0422  BP: (!) 113/54 (!) 124/55  Pulse: 65 (!) 41  Resp: 14 11  Temp: 98 F (36.7 C) 97.7 F (36.5 C)  SpO2: 99% 100%   Physical Exam: Lungs:  Non labored Incisions:  L groin with vac in place with good seal Extremities:  L foot warm to touch; L GT ulcer dry; JP drain with additional 40cc; L heel sore to touch Neurologic: A&O  CBC    Component Value Date/Time   WBC 10.3 07/01/2018 0007   RBC 2.76 (L) 07/01/2018 0007   HGB 7.6 (L) 07/01/2018 0007   HCT 24.0 (L) 07/01/2018 0007   PLT 213 07/01/2018 0007   MCV 87.0 07/01/2018 0007   MCH 27.5 07/01/2018 0007   MCHC 31.7 07/01/2018 0007   RDW 16.5 (H) 07/01/2018 0007   LYMPHSABS 1.2 07/19/2018 1644   MONOABS 0.9 07/16/2018 1644   EOSABS 0.1 07/20/2018 1644   BASOSABS 0.0 07/11/2018 1644    BMET    Component Value Date/Time   NA 135 07/18/2018 1821   K 3.7 07/04/2018 1821   CL 100 07/21/2018 1644   CO2 29 07/20/2018 1644   GLUCOSE 123 (H) 07/18/2018 1821   BUN 16 07/05/2018 1644   CREATININE 2.72 (H) 07/18/2018 1644   CALCIUM 7.6 (L) 07/07/2018 1644   GFRNONAA 25 (L) 07/05/2018 1644   GFRAA 29 (L) 07/17/2018 1644    INR    Component Value Date/Time   INR 1.5 (H) 06/16/2018 1939     Intake/Output Summary (Last 24 hours) at 07/02/2018 0742 Last data filed at 07/02/2018 0300 Gross per 24 hour  Intake 930 ml  Output 380 ml  Net 550 ml     Assessment/Plan:  57 y.o. male is s/p muscle flap to cover bleeding groin x2 2 Days Post-Op   Wound vac in place with good seal; JP drain with additional 40cc overnight Continue IV ancef based on culture results Prevalon boot ordered for L foot Wound vac change after HD vs tomorrow morning Patient is aware of high risk for re-bleeding   Dagoberto Ligas,  PA-C [facility] [phone] 07/02/2018 7:42 AM  Agree with above  Ruta Hinds, MD [facility] Office: [phone] Pager: [phone]

## 2018-07-02 NOTE — Progress Notes (Signed)
PT Cancellation Note  Patient Details Name: Jose Sellers MRN: [id number] DOB: 1960-05-21   Cancelled Treatment:    Reason Eval/Treat Not Completed: Patient at procedure or test/unavailable. Pt in HD   [place] 07/02/2018, 8:56 AM  [place] Pager [phone] Office [phone]

## 2018-07-02 NOTE — Progress Notes (Signed)
Report given to dialysis RN, plan for pt to go to dialysis in an hour.

## 2018-07-02 NOTE — Progress Notes (Signed)
OT Cancellation Note  Patient Details Name: Jose Sellers MRN: [id number] DOB: June 25, 1960   Cancelled Treatment:    Reason Eval/Treat Not Completed: Active bedrest order, will return as able.    Delight Stare, OT Acute Rehabilitation Services Pager [phone] Office [phone]   Delight Stare 07/02/2018, 7:41 AM

## 2018-07-02 NOTE — Progress Notes (Signed)
[facility] ROUNDING NOTE   Subjective:   This is a 58 year old gentleman end-stage renal disease Monday Wednesday Friday dialysis.  He was admitted 06/20/2018 for repair of bleeding left femoropopliteal bypass site.  He had undergone evaluation and graft removal with vein patch 06/15/2018.  He has noncompliance with his dialysis and fluid restriction.  He received an extra dialysis treatment Saturday for 06/2018.  Blood pressure 124/55 pulse 62 temperature 97.7 O2 sats 100% 2 L nasal cannula.  Labs appear to be pending this morning.  Last hemoglobin was 7.6.  Last basic metabolic panel 12/01/1882 sodium 134 potassium 3.4 chloride 100 CO2 29 BUN 16 creatinine 2.72 glucose 120.  Cultures MRSA 06/14/2018 and Klebsiella oxytocin 06/18/2018   wound cultures.  Aspirin 81 mg daily, Ancef 2 g Monday Wednesday Friday, vancomycin 1 g Monday Wednesday Friday, amiodarone 200 mg twice daily, atorvastatin 40 mg twice daily, metoprolol 12.5 mg twice daily, Midrin 10 mg twice daily, Prozac 20 mg daily, calcitriol 0.5 mcg Monday Wednesday Friday, insulin sliding scale, darbepoetin 100 mcg every 2 weeks, Neurontin 100 mg 3 times daily, Xopenex nebulizer 0.63 mg twice daily.  Nutritional supplementation Protostat sugar-free 30 cc twice daily, Rena-Vite 1 daily  Objective:  Vital signs in last 24 hours:  Temp:  [97.7 F (36.5 C)-98.3 F (36.8 C)] 97.7 F (36.5 C) (04/06 0422) Pulse Rate:  [41-78] 41 (04/06 0422) Resp:  [11-25] 11 (04/06 0422) BP: (99-147)/(54-78) 124/55 (04/06 0422) SpO2:  [95 %-100 %] 100 % (04/06 0422) Arterial Line BP: (110-112)/(50-52) 112/52 (04/05 1100) Weight:  [81.8 kg-82.1 kg] 82.1 kg (04/06 0422)  Weight change: -0.1 kg Filed Weights   07/01/18 0354 07/01/18 0909 07/02/18 0422  Weight: 82.8 kg 81.8 kg 82.1 kg    Intake/Output: I/O last 3 completed shifts: In: 1595.3 [P.O.:1130; Blood:465.3] Out: 410 [Drains:410]   Intake/Output this shift:  No intake/output  data recorded. Chronically ill-appearing gentleman CVS-  regular rate and rhythm with no JVP distention RS- CTA no wheezes or rales ABD- BS present soft non-distended EXT- no edema left groin wound with wound VAC.  Right AV fistula with thrill and bruit  Dialysis Monday Wednesday Friday 4 hours.  350/1.5   EDW 78 kg   2/2.25 bath right AV fistula no heparin  Venofer 100 mg weekly  no ESA Calcitrol 0.5 mcg Monday Wednesday Friday   Basic Metabolic Panel: Recent Labs  Lab 06/26/18 0259 06/27/18 0729 06/29/18 0722 06/27/2018 0732 07/19/2018 1644 07/11/2018 1645 07/07/2018 1821  NA 136 130* 126* 131* 134* 133* 135  K 3.9 3.7 4.5 4.0 3.4* 3.5 3.7  CL 100 94* 90* 95* 100  --   --   CO2 28 24 23 27 29   --   --   GLUCOSE 89 130* 101* 125* 120*  --  123*  BUN 21* 27* 35* 24* 16  --   --   CREATININE 3.80* 4.42* 4.91* 3.81* 2.72*  --   --   CALCIUM 7.7* 7.8* 8.5* 8.4* 7.6*  --   --   PHOS  --  2.6 2.9 2.7  --   --   --     Liver Function Tests: Recent Labs  Lab 06/27/18 0729 06/29/18 0722 07/20/2018 0732  ALBUMIN 1.9* 2.1* 2.0*   No results for input(s): LIPASE, AMYLASE in the last 168 hours. No results for input(s): AMMONIA in the last 168 hours.  CBC: Recent Labs  Lab 06/28/18 0349 06/29/18 0721 07/25/2018 0731 06/29/2018 1644 07/13/2018 1645 07/13/2018 1821 07/01/18  0007  WBC 10.1 9.7 9.0 11.5*  --   --  10.3  NEUTROABS  --   --   --  9.2*  --   --   --   HGB 7.1* 7.5* 8.0* 6.8* 7.1* 7.5* 7.6*  HCT 22.0* 24.2* 26.0* 21.3* 21.0* 22.0* 24.0*  MCV 85.6 86.7 87.5 88.0  --   --  87.0  PLT 181 229 255 286  --   --  213    Cardiac Enzymes: No results for input(s): CKTOTAL, CKMB, CKMBINDEX, TROPONINI in the last 168 hours.  BNP: Invalid input(s): POCBNP  CBG: Recent Labs  Lab 07/01/18 0631 07/01/18 1257 07/01/18 1624 07/01/18 2138 07/02/18 0616  GLUCAP 89 100* 95 123* 86    Microbiology: Results for orders placed or performed during the hospital encounter of 06/17/2018   Aerobic Culture (superficial specimen)     Status: None   Collection Time: 06/15/2018  6:51 PM  Result Value Ref Range Status   Specimen Description WOUND LEFT GROIN  Final   Special Requests NONE  Final   Gram Stain   Final    ABUNDANT WBC PRESENT,BOTH PMN AND MONONUCLEAR NO ORGANISMS SEEN Performed at [facility], [address], [place], [place] [address]    Culture FEW KLEBSIELLA OXYTOCA  Final   Report Status 06/27/2018 FINAL  Final   Organism ID, Bacteria KLEBSIELLA OXYTOCA  Final      Susceptibility   Klebsiella oxytoca - MIC*    AMPICILLIN >=32 RESISTANT Resistant     CEFAZOLIN <=4 SENSITIVE Sensitive     CEFEPIME <=1 SENSITIVE Sensitive     CEFTAZIDIME <=1 SENSITIVE Sensitive     CEFTRIAXONE <=1 SENSITIVE Sensitive     CIPROFLOXACIN <=0.25 SENSITIVE Sensitive     GENTAMICIN <=1 SENSITIVE Sensitive     IMIPENEM <=0.25 SENSITIVE Sensitive     TRIMETH/SULFA <=20 SENSITIVE Sensitive     AMPICILLIN/SULBACTAM 8 SENSITIVE Sensitive     PIP/TAZO <=4 SENSITIVE Sensitive     Extended ESBL NEGATIVE Sensitive     * FEW KLEBSIELLA OXYTOCA    Coagulation Studies: No results for input(s): LABPROT, INR in the last 72 hours.  Urinalysis: No results for input(s): COLORURINE, LABSPEC, PHURINE, GLUCOSEU, HGBUR, BILIRUBINUR, KETONESUR, PROTEINUR, UROBILINOGEN, NITRITE, LEUKOCYTESUR in the last 72 hours.  Invalid input(s): APPERANCEUR    Imaging: No results found.   Medications:   .  ceFAZolin (ANCEF) IV 2 g (06/29/18 1027)  . magnesium sulfate 1 - 4 g bolus IVPB    . vancomycin 1,000 mg (06/29/18 0923)   . acidophilus  1 capsule Oral Daily  . amiodarone  200 mg Oral BID  . aspirin EC  81 mg Oral Daily  . atorvastatin  40 mg Oral BID  . calcitRIOL  0.5 mcg Oral Q M,W,F-HD  . Chlorhexidine Gluconate Cloth  6 each Topical Q0600  . [START ON 07/06/2018] darbepoetin (ARANESP) injection - NON-DIALYSIS  100 mcg Subcutaneous Q Fri-1800  . docusate sodium  100 mg Oral Daily   . feeding supplement (PRO-STAT SUGAR FREE 64)  30 mL Oral BID  . ferric citrate  420 mg Oral TID WC  . FLUoxetine  20 mg Oral Daily  . gabapentin  100 mg Oral TID  . insulin aspart  0-9 Units Subcutaneous TID WC  . levalbuterol  0.63 mg Inhalation BID  . metoprolol tartrate  12.5 mg Oral BID  . midodrine  10 mg Oral BID WC  . multivitamin  1 tablet Oral QHS  .  pantoprazole  40 mg Oral Daily   acetaminophen **OR** acetaminophen, bisacodyl, guaiFENesin-dextromethorphan, HYDROmorphone (DILAUDID) injection, labetalol, lidocaine (PF), magnesium sulfate 1 - 4 g bolus IVPB, nitroGLYCERIN, ondansetron, oxycodone, oxyCODONE-acetaminophen, phenol, polyethylene glycol  Assessment/ Plan:   Bleeding and infection left groin wound.  Status post left femoropopliteal bypass subsequent infection with graft removal 06/15/2018.  Admitted with bleeding and sepsis presented 05/28/2018.  Taken for repair of bleeding by V VS.  Cultures positive for Klebsiella oxytocin and MRSA.  Continues IV Ancef and vancomycin.  Date started appears to be 06/24/2018.  I would anticipate will need at least 3 weeks of antibiotics which would be 07/16/2018  End-stage renal disease Monday Wednesday Friday dialysis AV fistula working well.  Volume overload with noncompliance with fluid restriction extra dialysis treatment 07/19/2018 is on schedule for dialysis 07/02/2018  Acute blood loss anemia status post 3 units packed red blood cells.  Initiated darbepoetin and received IV iron.  He is received an additional 2 units of packed red blood cells 06/28/2018.  Total transfusion 5 units  Nutrition stable with vitamin supplementation  Bones continue calcitriol and binders  COPD continues on home oxygen  Chronic systolic heart failure with an ejection fraction 25 to 30% continues on Midrin 10 mg twice daily for blood pressure support  Chronic atrial fibrillation amiodarone and was on Eliquis this is been discontinued no anticoagulation at  this present time.  Neuropathy Neurontin 100 mg 3 times daily dosed appropriately for renal failure  Diabetes mellitus as per primary team   LOS: [place] @TODAY @7 :45 AM

## 2018-07-02 NOTE — Progress Notes (Addendum)
Physical Therapy Discharge Patient Details Name: Jose Sellers MRN: [id number] DOB: July 06, 1960 Today's Date: 07/02/2018 Time:  -     Patient discharged from PT services secondary to per MD order. Please re-order as appropriate.  Please see latest therapy progress note for current level of functioning and progress toward goals.        [place] 07/02/2018, 1:44 PM  [place] Pager [phone] Office [phone]

## 2018-07-03 ENCOUNTER — Ambulatory Visit: Payer: Medicare Other | Admitting: Vascular Surgery

## 2018-07-03 ENCOUNTER — Encounter (HOSPITAL_COMMUNITY): Payer: Medicare Other

## 2018-07-03 LAB — GLUCOSE, CAPILLARY
Glucose-Capillary: 102 mg/dL — ABNORMAL HIGH (ref 70–99)
Glucose-Capillary: 129 mg/dL — ABNORMAL HIGH (ref 70–99)
Glucose-Capillary: 93 mg/dL (ref 70–99)

## 2018-07-03 MED ORDER — CHLORHEXIDINE GLUCONATE CLOTH 2 % EX PADS
6.0000 | MEDICATED_PAD | Freq: Every day | CUTANEOUS | Status: DC
  Administered 2018-07-03 – 2018-07-06 (×3): 6 via TOPICAL

## 2018-07-03 MED ORDER — NEPRO/CARBSTEADY PO LIQD
237.0000 mL | Freq: Three times a day (TID) | ORAL | Status: DC
  Administered 2018-07-03 – 2018-07-07 (×7): 237 mL via ORAL

## 2018-07-03 NOTE — Progress Notes (Signed)
[facility] ROUNDING NOTE   Subjective:   This is a 58 year old gentleman end-stage renal disease Monday Wednesday Friday dialysis.  He was admitted 05/29/2018 for repair of bleeding left femoropopliteal bypass site.  He had undergone evaluation and graft removal with vein patch 06/15/2018.  He has noncompliance with his dialysis and fluid restriction.  He received an extra dialysis treatment Saturday for 06/2018.  He underwent a successful dialysis treatment 07/02/2018 with ultrafiltration of 4 L.  His weight is 79.1 kg.  Blood pressure 130/67 pulse 73 temperature 98.5 O2 sats 100% room air  Sodium 129 potassium 4.3 chloride 96 CO2 23 BUN 28 creatinine 4.64 glucose 117 calcium 8.4 phosphorus 3.2 albumin 2.0 WBC 13.8 hemoglobin 8.3 and platelets of 264   Cultures MRSA 06/14/2018 and Klebsiella oxytocin 06/09/2018   wound cultures.  Aspirin 81 mg daily, Ancef 2 g Monday Wednesday Friday, vancomycin 1 g Monday Wednesday Friday, amiodarone 200 mg twice daily, atorvastatin 40 mg twice daily, metoprolol 12.5 mg twice daily, Midrin 10 mg twice daily, Prozac 20 mg daily, calcitriol 0.5 mcg Monday Wednesday Friday, insulin sliding scale, darbepoetin 100 mcg every 2 weeks, Neurontin 100 mg 3 times daily, Xopenex nebulizer 0.63 mg twice daily.  Nutritional supplementation Protostat sugar-free 30 cc twice daily, Rena-Vite 1 daily  Objective:  Vital signs in last 24 hours:  Temp:  [97.8 F (36.6 C)-100.2 F (37.9 C)] 98.5 F (36.9 C) (04/07 0400) Pulse Rate:  [59-84] 73 (04/07 0400) Resp:  [10-19] 17 (04/07 0400) BP: (96-139)/(43-88) 130/67 (04/07 0400) SpO2:  [93 %-100 %] 100 % (04/07 0400) Weight:  [79.1 kg-83.3 kg] 79.1 kg (04/07 0521)  Weight change: 1.5 kg Filed Weights   07/02/18 0810 07/02/18 1219 07/03/18 0521  Weight: 83.3 kg 79.1 kg 79.1 kg    Intake/Output: I/O last 3 completed shifts: In: 904.8 [P.O.:600; IV Piggyback:304.8] Out: 5465 [Drains:565; Other:4000]    Intake/Output this shift:  No intake/output data recorded. Chronically ill-appearing gentleman CVS-  regular rate and rhythm with no JVP distention RS- CTA no wheezes or rales ABD- BS present soft non-distended EXT- no edema left groin wound with wound VAC.  Right AV fistula with thrill and bruit  Dialysis Monday Wednesday Friday 4 hours.  350/1.5   EDW 78 kg   2/2.25 bath right AV fistula no heparin  Venofer 100 mg weekly  no ESA Calcitrol 0.5 mcg Monday Wednesday Friday   Basic Metabolic Panel: Recent Labs  Lab 06/27/18 0729 06/29/18 0722 07/17/2018 0732 07/05/2018 1644 07/23/2018 1645 07/08/2018 1821 07/02/18 0830  NA 130* 126* 131* 134* 133* 135 129*  K 3.7 4.5 4.0 3.4* 3.5 3.7 4.3  CL 94* 90* 95* 100  --   --  96*  CO2 24 23 27 29   --   --  23  GLUCOSE 130* 101* 125* 120*  --  123* 117*  BUN 27* 35* 24* 16  --   --  28*  CREATININE 4.42* 4.91* 3.81* 2.72*  --   --  4.64*  CALCIUM 7.8* 8.5* 8.4* 7.6*  --   --  8.4*  PHOS 2.6 2.9 2.7  --   --   --  3.2    Liver Function Tests: Recent Labs  Lab 06/27/18 0729 06/29/18 0722 06/29/2018 0732 07/02/18 0830  ALBUMIN 1.9* 2.1* 2.0* 2.0*   No results for input(s): LIPASE, AMYLASE in the last 168 hours. No results for input(s): AMMONIA in the last 168 hours.  CBC: Recent Labs  Lab 07/04/2018 0731 07/04/2018  1644 07/08/2018 1645 07/01/2018 1821 07/01/18 0007 07/02/18 0829 07/02/18 1345  WBC 9.0 11.5*  --   --  10.3 11.6* 13.8*  NEUTROABS  --  9.2*  --   --   --   --   --   HGB 8.0* 6.8* 7.1* 7.5* 7.6* 7.3* 8.3*  HCT 26.0* 21.3* 21.0* 22.0* 24.0* 23.8* 26.6*  MCV 87.5 88.0  --   --  87.0 91.2 90.5  PLT 255 286  --   --  213 248 264    Cardiac Enzymes: No results for input(s): CKTOTAL, CKMB, CKMBINDEX, TROPONINI in the last 168 hours.  BNP: Invalid input(s): POCBNP  CBG: Recent Labs  Lab 07/02/18 0616 07/02/18 1314 07/02/18 1629 07/02/18 2219 07/03/18 0642  GLUCAP 86 90 88 95 102*    Microbiology: Results for  orders placed or performed during the hospital encounter of 06/24/2018  Aerobic Culture (superficial specimen)     Status: None   Collection Time: 06/23/2018  6:51 PM  Result Value Ref Range Status   Specimen Description WOUND LEFT GROIN  Final   Special Requests NONE  Final   Gram Stain   Final    ABUNDANT WBC PRESENT,BOTH PMN AND MONONUCLEAR NO ORGANISMS SEEN Performed at [facility], [place] [address], [place],  [address]    Culture FEW KLEBSIELLA OXYTOCA  Final   Report Status 06/27/2018 FINAL  Final   Organism ID, Bacteria KLEBSIELLA OXYTOCA  Final      Susceptibility   Klebsiella oxytoca - MIC*    AMPICILLIN >=32 RESISTANT Resistant     CEFAZOLIN <=4 SENSITIVE Sensitive     CEFEPIME <=1 SENSITIVE Sensitive     CEFTAZIDIME <=1 SENSITIVE Sensitive     CEFTRIAXONE <=1 SENSITIVE Sensitive     CIPROFLOXACIN <=0.25 SENSITIVE Sensitive     GENTAMICIN <=1 SENSITIVE Sensitive     IMIPENEM <=0.25 SENSITIVE Sensitive     TRIMETH/SULFA <=20 SENSITIVE Sensitive     AMPICILLIN/SULBACTAM 8 SENSITIVE Sensitive     PIP/TAZO <=4 SENSITIVE Sensitive     Extended ESBL NEGATIVE Sensitive     * FEW KLEBSIELLA OXYTOCA    Coagulation Studies: No results for input(s): LABPROT, INR in the last 72 hours.  Urinalysis: No results for input(s): COLORURINE, LABSPEC, PHURINE, GLUCOSEU, HGBUR, BILIRUBINUR, KETONESUR, PROTEINUR, UROBILINOGEN, NITRITE, LEUKOCYTESUR in the last 72 hours.  Invalid input(s): APPERANCEUR    Imaging: No results found.   Medications:   .  ceFAZolin (ANCEF) IV Stopped (07/02/18 1206)  . magnesium sulfate 1 - 4 g bolus IVPB    . vancomycin 1,000 mg (06/29/18 0923)   . acidophilus  1 capsule Oral Daily  . amiodarone  200 mg Oral BID  . aspirin EC  81 mg Oral Daily  . atorvastatin  40 mg Oral BID  . calcitRIOL  0.5 mcg Oral Q M,W,F-HD  . Chlorhexidine Gluconate Cloth  6 each Topical Q0600  . [START ON 07/06/2018] darbepoetin (ARANESP) injection - NON-DIALYSIS   100 mcg Subcutaneous Q Fri-1800  . docusate sodium  100 mg Oral Daily  . feeding supplement (PRO-STAT SUGAR FREE 64)  30 mL Oral BID  . ferric citrate  420 mg Oral TID WC  . FLUoxetine  20 mg Oral Daily  . gabapentin  100 mg Oral TID  . insulin aspart  0-9 Units Subcutaneous TID WC  . levalbuterol  0.63 mg Inhalation BID  . metoprolol tartrate  12.5 mg Oral BID  . midodrine  10 mg Oral BID  WC  . multivitamin  1 tablet Oral QHS  . pantoprazole  40 mg Oral Daily   acetaminophen **OR** acetaminophen, bisacodyl, guaiFENesin-dextromethorphan, HYDROmorphone (DILAUDID) injection, labetalol, lidocaine (PF), magnesium sulfate 1 - 4 g bolus IVPB, nitroGLYCERIN, ondansetron, oxycodone, oxyCODONE-acetaminophen, phenol, polyethylene glycol  Assessment/ Plan:   Bleeding and infection left groin wound.  Status post left femoropopliteal bypass subsequent infection with graft removal 06/15/2018.  Admitted with bleeding and sepsis presented 06/19/2018.  Taken for repair of bleeding by V VS.  Cultures positive for Klebsiella oxytocin and MRSA.  Continues IV Ancef and vancomycin.  Date started appears to be 06/17/2018.  I would anticipate will need at least 3 weeks of antibiotics which would be 07/16/2018  End-stage renal disease Monday Wednesday Friday dialysis AV fistula working well.  Volume overload with noncompliance with fluid restriction extra dialysis treatment 07/20/2018, ultrafiltration 4 L 07/02/2018 next dialysis planned for 07/04/2018  Acute blood loss anemia status post 3 units packed red blood cells.  Initiated darbepoetin and received IV iron.  He is received an additional 2 units of packed red blood cells 07/06/2018.  Total transfusion 5 units  Nutrition stable with vitamin supplementation  Bones continue calcitriol 0.5 mcg Monday Wednesday Friday.  Well-controlled calcium phosphorus balance  COPD continues on home oxygen  Chronic systolic heart failure with an ejection fraction 25 to 30% continues  on Midrin 10 mg twice daily for blood pressure support  Chronic atrial fibrillation amiodarone and was on Eliquis this is been discontinued no anticoagulation at this present time.  Neuropathy Neurontin 100 mg 3 times daily dosed appropriately for renal failure  Diabetes mellitus as per primary team   LOS: [place] @TODAY @7 :31 AM

## 2018-07-03 NOTE — Progress Notes (Signed)
Pharmacy Antibiotic Note  Jose Sellers is a 58 y.o. male admitted on 06/11/2018 with klebsiella oxytoca wound infection (s/p left fem pop bypass and subsequent infection).   He also had a recent MRSA infection (infected bypass) and was on vancomycin PTA and continues on this. Pharmacy has been consulted for vancomycin dosing.  He is noted with ESRD with last HD on 4/6. Noted plans to continue for at least 3 weeks.   Plan: Continue vancomycin 1g IV QHD-MWF Continue Ancef 2gmIV MWF with HD,  Will follow patient progress  Height: 6' (182.9 cm) Weight: 174 lb 6.4 oz (79.1 kg) IBW/kg (Calculated) : 77.6  Temp (24hrs), Avg:98.6 F (37 C), Min:97.8 F (36.6 C), Max:100.2 F (37.9 C)  Recent Labs  Lab 06/27/18 0729  06/29/18 0722 07/20/2018 0731 07/05/2018 0732 06/29/2018 1644 07/01/18 0007 07/02/18 0829 07/02/18 0830 07/02/18 1345  WBC  --    < >  --  9.0  --  11.5* 10.3 11.6*  --  13.8*  CREATININE 4.42*  --  4.91*  --  3.81* 2.72*  --   --  4.64*  --   VANCORANDOM 21  --   --   --   --   --   --   --   --   --    < > = values in this interval not displayed.    Estimated Creatinine Clearance: 19.5 mL/min (A) (by C-G formula based on SCr of 4.64 mg/dL (H)).    Allergies  Allergen Reactions  . Albuterol Palpitations    SVT  . Prednisone Shortness Of Breath    Wife stated that patient develops breathing issues.  . Fluticasone Furoate-Vilanterol Palpitations  . Morphine Rash    Hildred Laser, PharmD Clinical Pharmacist **Pharmacist phone directory can now be found on [email or web address] ([email or web address]).  Listed under [place].

## 2018-07-03 NOTE — Progress Notes (Signed)
Left leg thigh dressing change completed without difficulty.  Patient tolerated well.  Will continue to monitor.

## 2018-07-03 NOTE — Progress Notes (Addendum)
  Progress Note    07/03/2018 8:28 AM 3 Days Post-Op  Subjective:  No complaints  Afebrile HR 60's-70's NSR 898'M-210'Z systolic 12% RA  Abx:   Cefazolin and Vanc  Vitals:   07/03/18 0759 07/03/18 0800  BP:  (!) 118/58  Pulse: 70 68  Resp: 18   Temp:  98.7 F (37.1 C)  SpO2: 95% 95%    Physical Exam: Cardiac:  regular Lungs:  Non labored Incisions:  Left groin with wound vac in place with good seal; leg wound with small amount of fibrinous tissue, otherwise, appears clean and healthy. Extremities:  +doppler signals left DP /PT; great toe wound dry without drainage.    CBC    Component Value Date/Time   WBC 13.8 (H) 07/02/2018 1345   RBC 2.94 (L) 07/02/2018 1345   HGB 8.3 (L) 07/02/2018 1345   HCT 26.6 (L) 07/02/2018 1345   PLT 264 07/02/2018 1345   MCV 90.5 07/02/2018 1345   MCH 28.2 07/02/2018 1345   MCHC 31.2 07/02/2018 1345   RDW 18.2 (H) 07/02/2018 1345   LYMPHSABS 1.2 07/14/2018 1644   MONOABS 0.9 07/25/2018 1644   EOSABS 0.1 07/03/2018 1644   BASOSABS 0.0 07/26/2018 1644    BMET    Component Value Date/Time   NA 129 (L) 07/02/2018 0830   K 4.3 07/02/2018 0830   CL 96 (L) 07/02/2018 0830   CO2 23 07/02/2018 0830   GLUCOSE 117 (H) 07/02/2018 0830   BUN 28 (H) 07/02/2018 0830   CREATININE 4.64 (H) 07/02/2018 0830   CALCIUM 8.4 (L) 07/02/2018 0830   GFRNONAA 13 (L) 07/02/2018 0830   GFRAA 15 (L) 07/02/2018 0830    INR    Component Value Date/Time   INR 1.5 (H) 05/27/2018 1939     Intake/Output Summary (Last 24 hours) at 07/03/2018 0828 Last data filed at 07/03/2018 0513 Gross per 24 hour  Intake 804.75 ml  Output 4410 ml  Net -3605.25 ml     Assessment:  58 y.o. male is s/p:  Patch angioplasty left common femoral artery (bovine pericardial patch) 7 Days Post-Op and muscle flap to cover bleeding groin x2  3 Days Post-Op  Plan: -pt vac with good seal (changed yesterday by [place]).  For vac change tomorrow. -leg wound dressing  changed-looks healthy.  Excised small amount of fibrinous tissue from wound bed, otherwise, wound is clean.  Pt tolerated well. -JP drain with 85cc/24hrs-leave in place -wound vac with 325cc/24hrs drainage   -has doppler signals DP/PT left foot -DVT prophylaxis:  SCD's for DVT prophylaxis.  Hold on sq heparin as pt is at high risk for bleeding.   -float heels   Leontine Locket, PA-C [facility] [phone] 07/03/2018 8:28 AM  VAC change tomorrow would keep in bed for now He is not eating, has severe protein calorie malnutrition which is limiting his wound healing Start nepro 4 cans per day Check prealbumin  Ruta Hinds, MD [facility] [place] Office: [phone] Pager: [phone]

## 2018-07-04 ENCOUNTER — Ambulatory Visit: Payer: Medicare Other | Admitting: Sports Medicine

## 2018-07-04 LAB — RENAL FUNCTION PANEL
Albumin: 1.9 g/dL — ABNORMAL LOW (ref 3.5–5.0)
Anion gap: 11 (ref 5–15)
BUN: 40 mg/dL — ABNORMAL HIGH (ref 6–20)
CO2: 25 mmol/L (ref 22–32)
Calcium: 8.8 mg/dL — ABNORMAL LOW (ref 8.9–10.3)
Chloride: 94 mmol/L — ABNORMAL LOW (ref 98–111)
Creatinine, Ser: 5.29 mg/dL — ABNORMAL HIGH (ref 0.61–1.24)
GFR calc Af Amer: 13 mL/min — ABNORMAL LOW (ref >60)
GFR calc non Af Amer: 11 mL/min — ABNORMAL LOW (ref >60)
Glucose, Bld: 119 mg/dL — ABNORMAL HIGH (ref 70–99)
Phosphorus: 3.1 mg/dL (ref 2.5–4.6)
Potassium: 4.3 mmol/L (ref 3.5–5.1)
Sodium: 130 mmol/L — ABNORMAL LOW (ref 135–145)

## 2018-07-04 LAB — GLUCOSE, CAPILLARY
Glucose-Capillary: 108 mg/dL — ABNORMAL HIGH (ref 70–99)
Glucose-Capillary: 113 mg/dL — ABNORMAL HIGH (ref 70–99)
Glucose-Capillary: 133 mg/dL — ABNORMAL HIGH (ref 70–99)
Glucose-Capillary: 135 mg/dL — ABNORMAL HIGH (ref 70–99)
Glucose-Capillary: 141 mg/dL — ABNORMAL HIGH (ref 70–99)

## 2018-07-04 LAB — CBC
HCT: 23.5 % — ABNORMAL LOW (ref 39.0–52.0)
Hemoglobin: 7.4 g/dL — ABNORMAL LOW (ref 13.0–17.0)
MCH: 29.4 pg (ref 26.0–34.0)
MCHC: 31.5 g/dL (ref 30.0–36.0)
MCV: 93.3 fL (ref 80.0–100.0)
Platelets: 236 10*3/uL (ref 150–400)
RBC: 2.52 MIL/uL — ABNORMAL LOW (ref 4.22–5.81)
RDW: 18.7 % — ABNORMAL HIGH (ref 11.5–15.5)
WBC: 8.5 10*3/uL (ref 4.0–10.5)
nRBC: 0 % (ref 0.0–0.2)

## 2018-07-04 LAB — PREALBUMIN: Prealbumin: 5.6 mg/dL — ABNORMAL LOW (ref 18–38)

## 2018-07-04 MED ORDER — HEPARIN SODIUM (PORCINE) 1000 UNIT/ML DIALYSIS
1000.0000 [IU] | INTRAMUSCULAR | Status: DC | PRN
  Filled 2018-07-04 (×2): qty 1

## 2018-07-04 MED ORDER — SODIUM CHLORIDE 0.9 % IV SOLN: 100.0000 mL | INTRAVENOUS | Status: DC | PRN

## 2018-07-04 MED ORDER — MIDODRINE HCL 5 MG PO TABS
ORAL_TABLET | ORAL | Status: AC
  Administered 2018-07-04: 10 mg via ORAL
  Filled 2018-07-04: qty 2

## 2018-07-04 MED ORDER — VANCOMYCIN HCL IN DEXTROSE 1-5 GM/200ML-% IV SOLN
INTRAVENOUS | Status: AC
  Administered 2018-07-04: 1000 mg
  Filled 2018-07-04: qty 200

## 2018-07-04 MED ORDER — ALTEPLASE 2 MG IJ SOLR: 2.0000 mg | Freq: Once | INTRAMUSCULAR | Status: DC | PRN

## 2018-07-04 MED ORDER — CALCITRIOL 0.5 MCG PO CAPS
ORAL_CAPSULE | ORAL | Status: AC
  Administered 2018-07-04: 0.5 ug via ORAL
  Filled 2018-07-04: qty 1

## 2018-07-04 NOTE — Progress Notes (Signed)
Patient ID: Jose Sellers, male   DOB: 08-21-1960, 58 y.o.   MRN: [id number]  Progress Note    07/04/2018 3:16 PM 4 Days Post-Op  Subjective: Patient has had hemodialysis earlier today.  Lethargic but does answer questions.  Does report pain in his left foot   Vitals:   07/04/18 1123 07/04/18 1216  BP: (!) 113/57 (!) 113/57  Pulse: 70 66  Resp: 17 16  Temp: 98.5 F (36.9 C)   SpO2: 95% 93%   Physical Exam: Left great toe 50% stable with the dry gangrene.  Left above-knee popliteal incision healing very nicely with good granulation base.  His left groin has a VAC dressing in place and also JP which is continued to put out lymphatics serous drainage.  CBC    Component Value Date/Time   WBC 8.5 07/04/2018 0707   RBC 2.52 (L) 07/04/2018 0707   HGB 7.4 (L) 07/04/2018 0707   HCT 23.5 (L) 07/04/2018 0707   PLT 236 07/04/2018 0707   MCV 93.3 07/04/2018 0707   MCH 29.4 07/04/2018 0707   MCHC 31.5 07/04/2018 0707   RDW 18.7 (H) 07/04/2018 0707   LYMPHSABS 1.2 07/01/2018 1644   MONOABS 0.9 07/15/2018 1644   EOSABS 0.1 07/11/2018 1644   BASOSABS 0.0 06/28/2018 1644    BMET    Component Value Date/Time   NA 130 (L) 07/04/2018 0707   K 4.3 07/04/2018 0707   CL 94 (L) 07/04/2018 0707   CO2 25 07/04/2018 0707   GLUCOSE 119 (H) 07/04/2018 0707   BUN 40 (H) 07/04/2018 0707   CREATININE 5.29 (H) 07/04/2018 0707   CALCIUM 8.8 (L) 07/04/2018 0707   GFRNONAA 11 (L) 07/04/2018 0707   GFRAA 13 (L) 07/04/2018 0707    INR    Component Value Date/Time   INR 1.5 (H) 06/03/2018 1939     Intake/Output Summary (Last 24 hours) at 07/04/2018 1516 Last data filed at 07/04/2018 1100 Gross per 24 hour  Intake 240 ml  Output 4378 ml  Net -4138 ml     Assessment/Plan:  58 y.o. male very difficult problem with recurrent infection and multiple bleeds from his left groin.  Still is very high risk for bleeding.  I discussed this at length with the patient's wife by telephone.  If he has  another bleed, I feel he will have exhausted all possibility of salvage flow into the profunda and would require ligation of his distal external iliac artery for control of bleeding and infection.  Explained the patient's wife that he would clearly would not have adequate flow for salvage of his limb and would require above-knee amputation with risk of healing even that.  Also explained that he certainly has had a life threatening situation if he continues to have a groin infection.  She expresses understanding and appreciates the care he is receiving  She also is requesting an order for a lift chair to help when he is transferred to home.  I feel that this is certainly appropriate.     Jose Posner, MD FACS [facility] [phone] 07/04/2018 3:16 PM

## 2018-07-04 NOTE — Consult Note (Signed)
[place] Nurse wound follow up Wound type:Surgical wound left groin Measurement:9.4 cm x 0.8 cm x 0.8 cm  Wound PTE:LMRAJ red Drainage (amount, consistency, odor) minimal serosanguinous no odor Periwound:Barrier ring used at distal end due to location in inguinal fold.  Dressing procedure/placement/frequency:1 piece black foam used. Seal achieved at 125 mmHg.  Will not follow at this time.  Please re-consult if needed.  Domenic Moras MSN, RN, FNP-BC CWON Wound, Ostomy, Continence Nurse Pager [phone]

## 2018-07-04 NOTE — Progress Notes (Signed)
Initial Nutrition Assessment  DOCUMENTATION CODES:   Not applicable  INTERVENTION:    Continue 30 ml Prostat BID, each supplement provides 100 kcals and 15 grams protein.   Continue Nepro Shake po BID, each supplement provides 425 kcal and 19 grams protein  Continue Rena-Vit  NUTRITION DIAGNOSIS:   Increased nutrient needs related to wound healing as evidenced by estimated needs.  GOAL:   Patient will meet greater than or equal to 90% of their needs  MONITOR:   PO intake, Supplement acceptance, Labs, Weight trends, Skin, I & O's  REASON FOR ASSESSMENT:   LOS    ASSESSMENT:   Patient with PMH significant for CAD, CHF, COPD, DM, ESRD on HD, HLD, PVD, and s/p left fem pop bypass with subsequent infection. Ten days ago had graft removal/vein patch of left fem artery and was sent home with VAC on groin. Presents this admission with left groin bleeding.    4/3- left groin VAC placed 4/4- left fem artery repair, VAC placed  RD working remotely.  Unable to speak with pt on phone. Unable to contact nurse as she was in another contact room. Pt's intake looks to be inconsistent. Meal completions charted as 0-50% for pt's last eight meals. Unsure if pt is consuming supplements. Will continue with Nepro and Prostat to maximize calories and protein while admitted. Will attempt to speak with pt if possible to obtain more history.   Per nephrology, pt's EDW is 78 kg. Records indicate pt weighed 81.7 kg on 3/25 and 76.6 kg this admission. Unsure how much is dry wt loss versus fluid fluctuation. Will continue to monitor.   I/O: -10,512 ml since admit UOP: none recorded Wound Vac: 100 ml x 24 hrs JP drain: 75 ml x 24 hrs   Last HD on 4/8- net UF 4203 ml  Medications reviewed and include: calcitriol, colace, SS novolog, rena-vit, Mg sulfate Labs reviewed: Na 130 (L) corrected calcium 10.5 (H) CBG 83-133  Diet Order:   Diet Order            Diet renal/carb modified with fluid  restriction Diet-HS Snack? Snack-yes; Fluid restriction: 1200 mL Fluid; Room service appropriate? No; Fluid consistency: Thin  Diet effective now              EDUCATION NEEDS:   Education needs have been addressed  Skin:  Skin Assessment: Skin Integrity Issues: Skin Integrity Issues:: Wound VAC, Unstageable, Diabetic Ulcer Unstageable: left heel Wound Vac: left groin Diabetic Ulcer: left toe  Last BM:  4/5  Height:   Ht Readings from Last 1 Encounters:  06/12/2018 6' (1.829 m)    Weight:   Wt Readings from Last 1 Encounters:  07/04/18 76.6 kg    Ideal Body Weight:  80.9 kg  BMI:  Body mass index is 22.9 kg/m.  Estimated Nutritional Needs:   Kcal:  2400-2600 kcal  Protein:  120-140 grams  Fluid:  1.2 L fluid restriction   Mariana Single RD, LDN Clinical Nutrition Pager # - [phone]

## 2018-07-04 NOTE — Progress Notes (Signed)
[facility] ROUNDING NOTE   Subjective:   This is a 58 year old gentleman end-stage renal disease Monday Wednesday Friday dialysis.  He was admitted 06/11/2018 for repair of bleeding left femoropopliteal bypass site.  He had undergone evaluation and graft removal with vein patch 06/15/2018.  He has noncompliance with his dialysis and fluid restriction.  He received an extra dialysis treatment Saturday for 06/2018.  He underwent a successful dialysis treatment 07/02/2018 with ultrafiltration of 4 L.  His weight is 80.7.  He is scheduled for dialysis 07/04/2018.  Patient receiving his dialysis treatment  Blood pressure 130/60 pulse 72 temperature 98.4 O2 sats 96% room air  Sodium 130 potassium 4.3 chloride 94 CO2 25 BUN 40 creatinine 5.29 glucose 119 calcium 8.8 phosphorus 3.1 albumin 1.9 WBC 8.5 hemoglobin 7.4 platelets 236   Cultures MRSA 06/14/2018 and Klebsiella oxytocin 06/10/2018   wound cultures.  Aspirin 81 mg daily, Ancef 2 g Monday Wednesday Friday, vancomycin 1 g Monday Wednesday Friday, amiodarone 200 mg twice daily, atorvastatin 40 mg twice daily, metoprolol 12.5 mg twice daily, Midodrin 10 mg twice daily, Prozac 20 mg daily, calcitriol 0.5 mcg Monday Wednesday Friday, insulin sliding scale, darbepoetin 100 mcg every 2 weeks, Neurontin 100 mg 3 times daily, Xopenex nebulizer 0.63 mg twice daily.  Nutritional supplementation Protostat sugar-free 30 cc twice daily, Rena-Vite 1 daily  Objective:  Vital signs in last 24 hours:  Temp:  [98.4 F (36.9 C)-99.4 F (37.4 C)] 98.4 F (36.9 C) (04/08 0615) Pulse Rate:  [68-76] 72 (04/08 0730) Resp:  [12-21] 16 (04/08 0615) BP: (118-136)/(57-71) 132/65 (04/08 0730) SpO2:  [90 %-96 %] 96 % (04/08 0615) FiO2 (%):  [21 %] 21 % (04/07 0759) Weight:  [80.7 kg] 80.7 kg (04/08 0615)  Weight change: -2.6 kg Filed Weights   07/03/18 0521 07/04/18 0520 07/04/18 0615  Weight: 79.1 kg 80.7 kg 80.7 kg    Intake/Output: I/O last 3  completed shifts: In: 880 [P.O.:880] Out: 585 [Drains:585]   Intake/Output this shift:  No intake/output data recorded. Chronically ill-appearing gentleman CVS-  regular rate and rhythm with no JVP distention RS- CTA no wheezes or rales ABD- BS present soft non-distended EXT- no edema left groin wound with wound VAC.  Right AV fistula with thrill and bruit  Dialysis Monday Wednesday Friday 4 hours.  350/1.5   EDW 78 kg   2/2.25 bath right AV fistula no heparin  Venofer 100 mg weekly  no ESA Calcitrol 0.5 mcg Monday Wednesday Friday   Basic Metabolic Panel: Recent Labs  Lab 06/29/18 0722 07/06/2018 0732 07/18/2018 1644 07/26/2018 1645 07/19/2018 1821 07/02/18 0830  NA 126* 131* 134* 133* 135 129*  K 4.5 4.0 3.4* 3.5 3.7 4.3  CL 90* 95* 100  --   --  96*  CO2 23 27 29   --   --  23  GLUCOSE 101* 125* 120*  --  123* 117*  BUN 35* 24* 16  --   --  28*  CREATININE 4.91* 3.81* 2.72*  --   --  4.64*  CALCIUM 8.5* 8.4* 7.6*  --   --  8.4*  PHOS 2.9 2.7  --   --   --  3.2    Liver Function Tests: Recent Labs  Lab 06/29/18 0722 07/19/2018 0732 07/02/18 0830  ALBUMIN 2.1* 2.0* 2.0*   No results for input(s): LIPASE, AMYLASE in the last 168 hours. No results for input(s): AMMONIA in the last 168 hours.  CBC: Recent Labs  Lab 07/25/2018 1644  07/26/2018 1821 07/01/18 0007 07/02/18 0829 07/02/18 1345 07/04/18 0707  WBC 11.5*  --   --  10.3 11.6* 13.8* 8.5  NEUTROABS 9.2*  --   --   --   --   --   --   HGB 6.8*   < > 7.5* 7.6* 7.3* 8.3* 7.4*  HCT 21.3*   < > 22.0* 24.0* 23.8* 26.6* 23.5*  MCV 88.0  --   --  87.0 91.2 90.5 93.3  PLT 286  --   --  213 248 264 236   < > = values in this interval not displayed.    Cardiac Enzymes: No results for input(s): CKTOTAL, CKMB, CKMBINDEX, TROPONINI in the last 168 hours.  BNP: Invalid input(s): POCBNP  CBG: Recent Labs  Lab 07/03/18 0642 07/03/18 1142 07/03/18 1615 07/04/18 0045 07/04/18 0616  GLUCAP 102* 129* 93 108* 113*     Microbiology: Results for orders placed or performed during the hospital encounter of 05/31/2018  Aerobic Culture (superficial specimen)     Status: None   Collection Time: 06/16/2018  6:51 PM  Result Value Ref Range Status   Specimen Description WOUND LEFT GROIN  Final   Special Requests NONE  Final   Gram Stain   Final    ABUNDANT WBC PRESENT,BOTH PMN AND MONONUCLEAR NO ORGANISMS SEEN Performed at [facility], [place] [address], [place], [place] [address]    Culture FEW KLEBSIELLA OXYTOCA  Final   Report Status 06/27/2018 FINAL  Final   Organism ID, Bacteria KLEBSIELLA OXYTOCA  Final      Susceptibility   Klebsiella oxytoca - MIC*    AMPICILLIN >=32 RESISTANT Resistant     CEFAZOLIN <=4 SENSITIVE Sensitive     CEFEPIME <=1 SENSITIVE Sensitive     CEFTAZIDIME <=1 SENSITIVE Sensitive     CEFTRIAXONE <=1 SENSITIVE Sensitive     CIPROFLOXACIN <=0.25 SENSITIVE Sensitive     GENTAMICIN <=1 SENSITIVE Sensitive     IMIPENEM <=0.25 SENSITIVE Sensitive     TRIMETH/SULFA <=20 SENSITIVE Sensitive     AMPICILLIN/SULBACTAM 8 SENSITIVE Sensitive     PIP/TAZO <=4 SENSITIVE Sensitive     Extended ESBL NEGATIVE Sensitive     * FEW KLEBSIELLA OXYTOCA    Coagulation Studies: No results for input(s): LABPROT, INR in the last 72 hours.  Urinalysis: No results for input(s): COLORURINE, LABSPEC, PHURINE, GLUCOSEU, HGBUR, BILIRUBINUR, KETONESUR, PROTEINUR, UROBILINOGEN, NITRITE, LEUKOCYTESUR in the last 72 hours.  Invalid input(s): APPERANCEUR    Imaging: No results found.   Medications:   . sodium chloride    . sodium chloride    .  ceFAZolin (ANCEF) IV Stopped (07/02/18 1206)  . magnesium sulfate 1 - 4 g bolus IVPB    . vancomycin 1,000 mg (06/29/18 0923)   . acidophilus  1 capsule Oral Daily  . amiodarone  200 mg Oral BID  . aspirin EC  81 mg Oral Daily  . atorvastatin  40 mg Oral BID  . calcitRIOL  0.5 mcg Oral Q M,W,F-HD  . Chlorhexidine Gluconate Cloth  6 each Topical  Q0600  . Chlorhexidine Gluconate Cloth  6 each Topical Q0600  . [START ON 07/06/2018] darbepoetin (ARANESP) injection - NON-DIALYSIS  100 mcg Subcutaneous Q Fri-1800  . docusate sodium  100 mg Oral Daily  . feeding supplement (NEPRO CARB STEADY)  237 mL Oral TID WC  . feeding supplement (PRO-STAT SUGAR FREE 64)  30 mL Oral BID  . ferric citrate  420 mg Oral TID WC  .  FLUoxetine  20 mg Oral Daily  . gabapentin  100 mg Oral TID  . insulin aspart  0-9 Units Subcutaneous TID WC  . levalbuterol  0.63 mg Inhalation BID  . metoprolol tartrate  12.5 mg Oral BID  . midodrine  10 mg Oral BID WC  . multivitamin  1 tablet Oral QHS  . pantoprazole  40 mg Oral Daily   sodium chloride, sodium chloride, acetaminophen **OR** acetaminophen, alteplase, bisacodyl, guaiFENesin-dextromethorphan, heparin, HYDROmorphone (DILAUDID) injection, labetalol, lidocaine (PF), magnesium sulfate 1 - 4 g bolus IVPB, nitroGLYCERIN, ondansetron, oxycodone, oxyCODONE-acetaminophen, phenol, polyethylene glycol  Assessment/ Plan:   Bleeding and infection left groin wound.  Status post left femoropopliteal bypass subsequent infection with graft removal 06/15/2018.  Admitted with bleeding and sepsis presented 06/26/2018.  Taken for repair of bleeding by V VS.  Cultures positive for Klebsiella oxytocin and MRSA.  Continues IV Ancef and vancomycin.  Date started appears to be 06/09/2018.  I would anticipate will need at least 3 weeks of antibiotics which would be 07/16/2018  End-stage renal disease Monday Wednesday Friday dialysis AV fistula working well.  Volume overload with noncompliance with fluid restriction extra dialysis treatment 07/05/2018, patient is receiving his dialysis treatment 07/04/2018  Acute blood loss anemia status post 3 units packed red blood cells.  Initiated darbepoetin and received IV iron.  He is received an additional 2 units of packed red blood cells 06/29/2018.  Total transfusion 5 units  Nutrition stable with  vitamin supplementation  Bones continue calcitriol 0.5 mcg Monday Wednesday Friday.  Well-controlled calcium phosphorus balance  COPD continues on home oxygen  Chronic systolic heart failure with an ejection fraction 25 to 30% continues on Midrin 10 mg twice daily for blood pressure support  Chronic atrial fibrillation amiodarone and was on Eliquis this is been discontinued no anticoagulation at this present time.  Neuropathy Neurontin 100 mg 3 times daily dosed appropriately for renal failure  Diabetes mellitus as per primary team   LOS: [place] @TODAY @7 :49 AM

## 2018-07-05 ENCOUNTER — Encounter (HOSPITAL_COMMUNITY): Admission: EM | Disposition: E | Payer: Self-pay | Source: Home / Self Care | Attending: Vascular Surgery

## 2018-07-05 ENCOUNTER — Inpatient Hospital Stay (HOSPITAL_COMMUNITY): Payer: Medicare Other | Admitting: Anesthesiology

## 2018-07-05 DIAGNOSIS — I97618 Postprocedural hemorrhage and hematoma of a circulatory system organ or structure following other circulatory system procedure: Secondary | ICD-10-CM

## 2018-07-05 HISTORY — PX: FEMORAL ARTERY EXPLORATION: SHX5160

## 2018-07-05 LAB — GLUCOSE, CAPILLARY
Glucose-Capillary: 125 mg/dL — ABNORMAL HIGH (ref 70–99)
Glucose-Capillary: 126 mg/dL — ABNORMAL HIGH (ref 70–99)
Glucose-Capillary: 136 mg/dL — ABNORMAL HIGH (ref 70–99)
Glucose-Capillary: 129 mg/dL — ABNORMAL HIGH (ref 70–99)
Glucose-Capillary: 137 mg/dL — ABNORMAL HIGH (ref 70–99)

## 2018-07-05 LAB — POCT I-STAT 4, (NA,K, GLUC, HGB,HCT)
Glucose, Bld: 140 mg/dL — ABNORMAL HIGH (ref 70–99)
HCT: 26 % — ABNORMAL LOW (ref 39.0–52.0)
Hemoglobin: 8.8 g/dL — ABNORMAL LOW (ref 13.0–17.0)
Potassium: 4.7 mmol/L (ref 3.5–5.1)
Sodium: 133 mmol/L — ABNORMAL LOW (ref 135–145)

## 2018-07-05 SURGERY — EXPLORATION, ARTERY, FEMORAL
Anesthesia: General | Laterality: Left

## 2018-07-05 MED ORDER — ONDANSETRON HCL 4 MG/2ML IJ SOLN
INTRAMUSCULAR | Status: DC | PRN
  Administered 2018-07-05: 4 mg via INTRAVENOUS

## 2018-07-05 MED ORDER — SODIUM CHLORIDE 0.9 % IV SOLN
INTRAVENOUS | Status: DC | PRN
  Administered 2018-07-05: 20 ug/min via INTRAVENOUS

## 2018-07-05 MED ORDER — ONDANSETRON HCL 4 MG/2ML IJ SOLN: 4.0000 mg | Freq: Once | INTRAMUSCULAR | Status: DC | PRN

## 2018-07-05 MED ORDER — MIDAZOLAM HCL 2 MG/2ML IJ SOLN
INTRAMUSCULAR | Status: DC | PRN
  Administered 2018-07-05: 1 mg via INTRAVENOUS

## 2018-07-05 MED ORDER — CEFAZOLIN SODIUM-DEXTROSE 2-3 GM-%(50ML) IV SOLR
INTRAVENOUS | Status: DC | PRN
  Administered 2018-07-05: 2 g via INTRAVENOUS

## 2018-07-05 MED ORDER — PROPOFOL 10 MG/ML IV BOLUS
INTRAVENOUS | Status: DC | PRN
  Administered 2018-07-05: 110 mg via INTRAVENOUS

## 2018-07-05 MED ORDER — HYDROMORPHONE HCL 1 MG/ML IJ SOLN
0.5000 mg | INTRAMUSCULAR | Status: DC | PRN
  Administered 2018-07-05 (×3): 1 mg via INTRAVENOUS
  Administered 2018-07-05: 07:00:00 0.5 mg via INTRAVENOUS
  Filled 2018-07-05 (×4): qty 1

## 2018-07-05 MED ORDER — SUCCINYLCHOLINE CHLORIDE 200 MG/10ML IV SOSY
PREFILLED_SYRINGE | INTRAVENOUS | Status: AC
  Filled 2018-07-05: qty 10

## 2018-07-05 MED ORDER — 0.9 % SODIUM CHLORIDE (POUR BTL) OPTIME
TOPICAL | Status: DC | PRN
  Administered 2018-07-05: 1000 mL

## 2018-07-05 MED ORDER — FENTANYL CITRATE (PF) 250 MCG/5ML IJ SOLN
INTRAMUSCULAR | Status: AC
  Filled 2018-07-05: qty 5

## 2018-07-05 MED ORDER — SUCCINYLCHOLINE 20MG/ML (10ML) SYRINGE FOR MEDFUSION PUMP - OPTIME
INTRAMUSCULAR | Status: DC | PRN
  Administered 2018-07-05: 120 mg via INTRAVENOUS

## 2018-07-05 MED ORDER — SODIUM CHLORIDE 0.9 % IV SOLN
INTRAVENOUS | Status: DC | PRN
  Administered 2018-07-05: 500 mL

## 2018-07-05 MED ORDER — SODIUM CHLORIDE 0.9 % IV SOLN
INTRAVENOUS | Status: DC | PRN
  Administered 2018-07-05: 03:00:00 via INTRAVENOUS

## 2018-07-05 MED ORDER — LIDOCAINE 2% (20 MG/ML) 5 ML SYRINGE
INTRAMUSCULAR | Status: AC
  Filled 2018-07-05: qty 5

## 2018-07-05 MED ORDER — MIDAZOLAM HCL 2 MG/2ML IJ SOLN
INTRAMUSCULAR | Status: AC
  Filled 2018-07-05: qty 2

## 2018-07-05 MED ORDER — ROCURONIUM BROMIDE 50 MG/5ML IV SOSY
PREFILLED_SYRINGE | INTRAVENOUS | Status: AC
  Filled 2018-07-05: qty 5

## 2018-07-05 MED ORDER — PROPOFOL 10 MG/ML IV BOLUS
INTRAVENOUS | Status: AC
  Filled 2018-07-05: qty 20

## 2018-07-05 MED ORDER — FENTANYL CITRATE (PF) 100 MCG/2ML IJ SOLN: 25.0000 ug | INTRAMUSCULAR | Status: DC | PRN

## 2018-07-05 MED ORDER — SODIUM CHLORIDE 0.9 % IV SOLN
INTRAVENOUS | Status: AC
  Filled 2018-07-05: qty 1.2

## 2018-07-05 MED ORDER — FENTANYL CITRATE (PF) 250 MCG/5ML IJ SOLN
INTRAMUSCULAR | Status: DC | PRN
  Administered 2018-07-05: 100 ug via INTRAVENOUS
  Administered 2018-07-05: 50 ug via INTRAVENOUS

## 2018-07-05 SURGICAL SUPPLY — 54 items
BANDAGE ESMARK 6X9 LF (GAUZE/BANDAGES/DRESSINGS) IMPLANT
BNDG ESMARK 6X9 LF (GAUZE/BANDAGES/DRESSINGS)
CANISTER SUCT 3000ML PPV (MISCELLANEOUS) ×3 IMPLANT
CANISTER WOUND CARE 500ML ATS (WOUND CARE) ×3 IMPLANT
CANNULA VESSEL 3MM 2 BLNT TIP (CANNULA) IMPLANT
CATH EMB 4FR 80CM (CATHETERS) ×3 IMPLANT
CLIP LIGATING EXTRA MED SLVR (CLIP) ×3 IMPLANT
CLIP LIGATING EXTRA SM BLUE (MISCELLANEOUS) ×3 IMPLANT
COVER WAND RF STERILE (DRAPES) IMPLANT
CUFF TOURNIQUET SINGLE 34IN LL (TOURNIQUET CUFF) IMPLANT
CUFF TOURNIQUET SINGLE 44IN (TOURNIQUET CUFF) IMPLANT
DERMABOND ADVANCED (GAUZE/BANDAGES/DRESSINGS)
DERMABOND ADVANCED .7 DNX12 (GAUZE/BANDAGES/DRESSINGS) IMPLANT
DRAIN SNY 10X20 3/4 PERF (WOUND CARE) IMPLANT
DRAPE X-RAY CASS 24X20 (DRAPES) IMPLANT
DRSG VAC ATS SM SENSATRAC (GAUZE/BANDAGES/DRESSINGS) ×3 IMPLANT
ELECT REM PT RETURN 9FT ADLT (ELECTROSURGICAL) ×3
ELECTRODE REM PT RTRN 9FT ADLT (ELECTROSURGICAL) ×1 IMPLANT
EVACUATOR SILICONE 100CC (DRAIN) IMPLANT
GAUZE SPONGE 4X4 12PLY STRL (GAUZE/BANDAGES/DRESSINGS) ×3 IMPLANT
GLOVE BIO SURGEON STRL SZ 6.5 (GLOVE) ×4 IMPLANT
GLOVE BIO SURGEON STRL SZ7.5 (GLOVE) ×3 IMPLANT
GLOVE BIO SURGEONS STRL SZ 6.5 (GLOVE) ×2
GLOVE BIOGEL PI IND STRL 7.0 (GLOVE) ×2 IMPLANT
GLOVE BIOGEL PI INDICATOR 7.0 (GLOVE) ×4
GLOVE SS BIOGEL STRL SZ 7.5 (GLOVE) ×1 IMPLANT
GLOVE SUPERSENSE BIOGEL SZ 7.5 (GLOVE) ×2
GOWN STRL REUS W/ TWL LRG LVL3 (GOWN DISPOSABLE) ×3 IMPLANT
GOWN STRL REUS W/TWL LRG LVL3 (GOWN DISPOSABLE) ×6
GOWN STRL REUS W/TWL XL LVL3 (GOWN DISPOSABLE) ×3 IMPLANT
INSERT FOGARTY SM (MISCELLANEOUS) IMPLANT
KIT BASIN OR (CUSTOM PROCEDURE TRAY) ×3 IMPLANT
KIT TURNOVER KIT B (KITS) ×3 IMPLANT
NS IRRIG 1000ML POUR BTL (IV SOLUTION) ×6 IMPLANT
PACK PERIPHERAL VASCULAR (CUSTOM PROCEDURE TRAY) ×3 IMPLANT
PAD ARMBOARD 7.5X6 YLW CONV (MISCELLANEOUS) ×6 IMPLANT
PADDING CAST COTTON 6X4 STRL (CAST SUPPLIES) IMPLANT
SET COLLECT BLD 21X3/4 12 (NEEDLE) IMPLANT
STAPLER VISISTAT 35W (STAPLE) IMPLANT
STOPCOCK 4 WAY LG BORE MALE ST (IV SETS) ×3 IMPLANT
SUT ETHILON 3 0 PS 1 (SUTURE) IMPLANT
SUT PROLENE 5 0 C 1 24 (SUTURE) ×12 IMPLANT
SUT PROLENE 6 0 CC (SUTURE) ×3 IMPLANT
SUT SILK 2 0 SH (SUTURE) IMPLANT
SUT VIC AB 2-0 CT1 27 (SUTURE) ×2
SUT VIC AB 2-0 CT1 TAPERPNT 27 (SUTURE) ×1 IMPLANT
SUT VIC AB 2-0 CTX 36 (SUTURE) IMPLANT
SUT VIC AB 3-0 SH 27 (SUTURE)
SUT VIC AB 3-0 SH 27X BRD (SUTURE) IMPLANT
TOWEL GREEN STERILE (TOWEL DISPOSABLE) ×3 IMPLANT
TRAY FOLEY MTR SLVR 16FR STAT (SET/KITS/TRAYS/PACK) IMPLANT
TUBING EXTENTION W/L.L. (IV SETS) IMPLANT
UNDERPAD 30X30 (UNDERPADS AND DIAPERS) ×3 IMPLANT
WATER STERILE IRR 1000ML POUR (IV SOLUTION) ×3 IMPLANT

## 2018-07-05 NOTE — Progress Notes (Signed)
Pt received from PACU. Pt responding to voice and pain. Initial assessment complete. Tele applie CCMD notified. Will continue to monitor.

## 2018-07-05 NOTE — Op Note (Signed)
OPERATIVE REPORT  DATE OF SURGERY: 07/15/2018  PATIENT: Jose Sellers, 58 y.o. male MRN: [id number]  DOB: 04-18-1960  PRE-OPERATIVE DIAGNOSIS: Recurrent bleeding left femoral artery anastomosis  POST-OPERATIVE DIAGNOSIS:  Same  PROCEDURE: Left groin exploration and oversewing of left profunda, superficial femoral and distal external iliac artery.  Placement of VAC dressing  SURGEON:  Curt Jews, M.D.  PHYSICIAN ASSISTANT: Matt Eveland, PA-C  ANESTHESIA: General  EBL: per anesthesia record  Total I/O In: -  Out: 370 [Drains:170; Blood:200]  BLOOD ADMINISTERED: none  DRAINS: none  SPECIMEN: none  COUNTS CORRECT:  YES  PATIENT DISPOSITION:  PACU - hemodynamically stable  PROCEDURE DETAILS: Patient has very complex history.  He underwent initial left femoral endarterectomy and Dacron patch angioplasty with left femoral to above-knee popliteal bypass with Gore-Tex on 05/04/2018.  He presented with a serous collection and infection postoperatively and went to the operating room on June 14, 2018 where he had removal of the Dacron patch over his distal external iliac common femoral and profundus femoral artery and placement of a saphenous vein patch.  He had removal of his prosthetic graft at the same setting.  He then presented with a significant bleed from this on 06/05/2018 and was taken back to the operating where he was found to have disruption of the saphenous vein.  He had placement of a bovine pericardial patch and removal of the saphenous vein.  He then had rebleeding on 07/01/2018 was taken back to the operating room where he underwent repair of the area of disruption in the bovine pericardial patch and sartorius muscle flap.  He had a VAC drain placed.  At approximately 2:30 AM on 4/ 8 he had bleeding from his JP drain and a VAC drain.  He is now taken immediately to the operating room for reexploration.  I discussed with the patient and also with his wife that if he had  recurrent bleeding in all likelihood would require ligation of his artery for control of the infection and ongoing life-threatening bleeds.  Also explained that he clearly would not have adequate flow for limb salvage if he required ligation  The patient was taken to the operating placed supine position where the area of the left groin was prepped and draped in usual sterile fashion.  The back dressing and JP drain were removed.  There was pulsatile mass in the groin.  Sutures were removed and hemostasis was obtained with digital pressure.  There was a large amount of hematoma present in the groin.  There was an area on the medial aspect of the patch where there was active bleeding.  On further dissection the artery was quite friable and was falling apart.  Felt that there was no option other than oversewing of the artery.  The deep femoral artery was controlled with baby Gregory clamp and the external iliac artery was controlled proximally with a Henley clamp.  The patch was removed and the femoral artery was extremely friable around the circumference of the arteriotomy.  The superficial femoral artery was oversewn with 5-0 Prolene in 2 layers.  This gave adequate hemostasis.  The patch was removed distally in its entirety and the deep femoral artery was oversewn in 2 layers with 5-0 Prolene as well.  The proximal portion of the patch was removed and the common femoral artery was transected at the inguinal ligament.  The artery had been endarterectomized up to this point.  This was debrided back to fresh edges and the  distal external iliac and junction with the common femoral artery were oversewn with 2 layers of 5-0 Prolene suture.  The Henley clamp was removed and adequate hemostasis was noted.  The wound was irrigated with saline and hemostasis obtained electrocautery.  The patient had had mobilization of the sartorius muscle on the most recent expiration on 07/21/2018.  This was placed back over the bed of the  resected artery.  A several 2-0 Vicryl sutures were used to position the sartorius in this area.  A VAC dressing was then positioned in the wound and was attached to VAC suction.  The patient was transferred to the recovery room in stable condition   [name], M.D., [facility] 07/02/2018 5:12 AM

## 2018-07-05 NOTE — Anesthesia Preprocedure Evaluation (Addendum)
Anesthesia Evaluation  Patient identified by MRN, date of birth, ID band Patient awake    Reviewed: Allergy & Precautions, NPO status , Patient's Chart, lab work & pertinent test results  Airway Mallampati: II  TM Distance: >3 FB Neck ROM: Full    Dental  (+) Edentulous Upper   Pulmonary Current Smoker,     + decreased breath sounds      Cardiovascular hypertension,  Rhythm:Irregular Rate:Normal     Neuro/Psych    GI/Hepatic   Endo/Other    Renal/GU      Musculoskeletal   Abdominal   Peds  Hematology   Anesthesia Other Findings Large hematoma and wound L. groin  Reproductive/Obstetrics                             Anesthesia Physical Anesthesia Plan  ASA: IV and emergent  Anesthesia Plan: General   Post-op Pain Management:    Induction: Intravenous, Rapid sequence and Cricoid pressure planned  PONV Risk Score and Plan: Ondansetron  Airway Management Planned: Oral ETT  Additional Equipment:   Intra-op Plan:   Post-operative Plan: Extubation in OR  Informed Consent: I have reviewed the patients History and Physical, chart, labs and discussed the procedure including the risks, benefits and alternatives for the proposed anesthesia with the patient or authorized representative who has indicated his/her understanding and acceptance.       Plan Discussed with: CRNA and Anesthesiologist  Anesthesia Plan Comments:         Anesthesia Quick Evaluation

## 2018-07-05 NOTE — Anesthesia Procedure Notes (Signed)
Procedure Name: Intubation Date/Time: 07/15/2018 3:32 AM Performed by: Valetta Fuller, CRNA Pre-anesthesia Checklist: Patient identified, Emergency Drugs available, Suction available and Patient being monitored Patient Re-evaluated:Patient Re-evaluated prior to induction Oxygen Delivery Method: Circle system utilized Preoxygenation: Pre-oxygenation with 100% oxygen Induction Type: IV induction, Rapid sequence and Cricoid Pressure applied Laryngoscope Size: Miller and 2 Grade View: Grade I Tube type: Oral Tube size: 7.5 mm Number of attempts: 1 Airway Equipment and Method: Stylet Placement Confirmation: ETT inserted through vocal cords under direct vision,  positive ETCO2 and breath sounds checked- equal and bilateral Secured at: 23 cm Tube secured with: Tape Dental Injury: Teeth and Oropharynx as per pre-operative assessment

## 2018-07-05 NOTE — Progress Notes (Addendum)
Wound vac found to be going off due to blockage. On assessment 50 ml of bright red blood measured. Left groin JP drain found to be full with bright red blood and large size clot. Unable to empty contents to get a accurate measurement about 120 ml. JP drain milked with smaller clots removed. Dr. Donnetta Hutching notified. Orders to keep patient NPO to be sent to the OR. JP drain bulb changed. PT wife called and updated.

## 2018-07-05 NOTE — Transfer of Care (Signed)
Immediate Anesthesia Transfer of Care Note  Patient: Jose Sellers  Procedure(s) Performed: LEFT GROIN EXPLORATION; LEFT FEMORAL ARTERY EXPLORATION; LIGATION OF LEFT EXTERNAL ILIAC ARTERY; LIGATION OF LEFT FEMORAL ARTERY; APPLICATION OF WOUND VAC LEFT GROIN (Left )  Patient Location: PACU  Anesthesia Type:General  Level of Consciousness: sedated and patient cooperative  Airway & Oxygen Therapy: Patient connected to nasal cannula oxygen  Post-op Assessment: Report given to RN and Post -op Vital signs reviewed and stable  Post vital signs: Reviewed and stable  Last Vitals:  Vitals Value Taken Time  BP 136/66 07/17/2018  5:14 AM  Temp    Pulse 70 07/15/2018  5:17 AM  Resp 24 07/20/2018  5:17 AM  SpO2 100 % 07/26/2018  5:17 AM  Vitals shown include unvalidated device data.  Last Pain:  Vitals:   07/04/18 2347  TempSrc: Oral  PainSc:       Patients Stated Pain Goal: 0 (41/32/44 0102)  Complications: No apparent anesthesia complications

## 2018-07-05 NOTE — Progress Notes (Signed)
Patient ID: Jose Sellers, male   DOB: April 09, 1960, 58 y.o.   MRN: [id number] The patient had a change in character of drainage around 2:30 AM.  Had right red blood in his Jackson-Pratt and his VAC drain was alarming clot as well.  Remains hemodynamically stable.  Obviously has had rebleeding from his femoral artery.  Discussed this again with patient as I had before.  Recurrent bleeding from his femoral artery site.  Had initial left femoral endarterectomy and Dacron patch on 05/04/2018.  Had this all removed and the Dacron patch replaced with his saphenous vein on 06/14/2018.  Had disruption of this with bleeding and replacement of the saphenous vein patch with bovine pericardial patch on 05/29/2018.  Then had subsequent bleed and repair of this patch on 07/25/2018.  I discussed with the patient's wife by telephone yesterday and with the patient that if he had recurrent bleeding is most likely treatment would be ligation of his femoral artery and hopeful control of the infection and bleeding.  Also explained the near certainty of requirement of a above-knee amputation with marginal healing possibility if this is required.  I again discussed this with patient this morning and he will be taken immediately to surgery for exploration possible repair and possible ligation and oversew of the external iliac artery

## 2018-07-05 NOTE — Progress Notes (Signed)
Patient ID: Jose Sellers, male   DOB: 1960/04/15, 58 y.o.   MRN: [id number] Discussed with patient's wife by telephone.  Explained that we removed his bovine pericardial patch and oversewed the artery proximally and distally.  Explained the extreme high expectation that he will require above-knee amputation with marginal flow for healing of this.  Also explained expected need for rehabilitation assuming he is able to heal amputation if required.  She is obviously very upset especially due to the fact that she is unable to visit him due to the visitation restrictions.

## 2018-07-05 NOTE — Progress Notes (Signed)
[facility] ROUNDING NOTE   Subjective:   This is a 58 year old gentleman end-stage renal disease Monday Wednesday Friday dialysis.  He was admitted 06/22/2018 for repair of bleeding left femoropopliteal bypass site.  He had undergone evaluation and graft removal with vein patch 06/15/2018.  He has noncompliance with his dialysis and fluid restriction.  He received an extra dialysis treatment Saturday for 06/2018.  He underwent successful dialysis 07/04/2018 with 4.273 L removed.  Weight 76.6 kg.  I appreciate assistance from Dr. Donnetta Hutching.  There is recurrent bleeding from the left femoral artery anastomosis.  He went left groin exploration oversewing of left profunda with placement of wound VAC 06/28/2018 as an emergency procedure following bleeding from left groin.  Blood pressure 150/80 pulse 85 temperature 97.9 O2 sats 95% 2 L nasal cannula  Sodium 133 potassium 4.7 glucose 140 hemoglobin 8.849 2020   Cultures MRSA 06/14/2018 and Klebsiella oxytocin 06/24/2018   wound cultures.  Aspirin 81 mg daily, Ancef 2 g Monday Wednesday Friday, vancomycin 1 g Monday Wednesday Friday, amiodarone 200 mg twice daily, atorvastatin 40 mg twice daily, metoprolol 12.5 mg twice daily, Midodrin 10 mg twice daily, Prozac 20 mg daily, calcitriol 0.5 mcg Monday Wednesday Friday, insulin sliding scale, darbepoetin 100 mcg every 2 weeks, Neurontin 100 mg 3 times daily, Xopenex nebulizer 0.63 mg twice daily.  Nutritional supplementation Protostat sugar-free 30 cc twice daily, Rena-Vite 1 daily  Objective:  Vital signs in last 24 hours:  Temp:  [97.8 F (36.6 C)-98.6 F (37 C)] 97.9 F (36.6 C) (04/09 0617) Pulse Rate:  [45-86] 86 (04/09 0617) Resp:  [15-26] 21 (04/09 0617) BP: (112-157)/(54-91) 150/80 (04/09 0617) SpO2:  [93 %-100 %] 97 % (04/09 0617) FiO2 (%):  [21 %] 21 % (04/08 2052) Weight:  [76.6 kg] 76.6 kg (04/08 1100)  Weight change: -4.1 kg Filed Weights   07/04/18 0520 07/04/18 0615 07/04/18  1100  Weight: 80.7 kg 80.7 kg 76.6 kg    Intake/Output: I/O last 3 completed shifts: In: 400 [I.V.:400] Out: 0300 [Drains:295; PQZRA:0762; Blood:200]   Intake/Output this shift:  No intake/output data recorded. Chronically ill-appearing gentleman CVS-  regular rate and rhythm with no JVP distention RS- CTA no wheezes or rales ABD- BS present soft non-distended EXT- no edema left groin wound with wound VAC.  Right AV fistula with thrill and bruit  Dialysis Monday Wednesday Friday 4 hours.  350/1.5   EDW 78 kg   2/2.25 bath right AV fistula no heparin  Venofer 100 mg weekly  no ESA Calcitrol 0.5 mcg Monday Wednesday Friday   Basic Metabolic Panel: Recent Labs  Lab 06/29/18 0722 06/27/2018 0732 07/07/2018 1644 06/29/2018 1645 06/29/2018 1821 07/02/18 0830 07/04/18 0707 07/13/2018 0345  NA 126* 131* 134* 133* 135 129* 130* 133*  K 4.5 4.0 3.4* 3.5 3.7 4.3 4.3 4.7  CL 90* 95* 100  --   --  96* 94*  --   CO2 23 27 29   --   --  23 25  --   GLUCOSE 101* 125* 120*  --  123* 117* 119* 140*  BUN 35* 24* 16  --   --  28* 40*  --   CREATININE 4.91* 3.81* 2.72*  --   --  4.64* 5.29*  --   CALCIUM 8.5* 8.4* 7.6*  --   --  8.4* 8.8*  --   PHOS 2.9 2.7  --   --   --  3.2 3.1  --     Liver Function  Tests: Recent Labs  Lab 06/29/18 0722 07/01/2018 0732 07/02/18 0830 07/04/18 0707  ALBUMIN 2.1* 2.0* 2.0* 1.9*   No results for input(s): LIPASE, AMYLASE in the last 168 hours. No results for input(s): AMMONIA in the last 168 hours.  CBC: Recent Labs  Lab 07/01/2018 1644  07/01/18 0007 07/02/18 0829 07/02/18 1345 07/04/18 0707 07/10/2018 0345  WBC 11.5*  --  10.3 11.6* 13.8* 8.5  --   NEUTROABS 9.2*  --   --   --   --   --   --   HGB 6.8*   < > 7.6* 7.3* 8.3* 7.4* 8.8*  HCT 21.3*   < > 24.0* 23.8* 26.6* 23.5* 26.0*  MCV 88.0  --  87.0 91.2 90.5 93.3  --   PLT 286  --  213 248 264 236  --    < > = values in this interval not displayed.    Cardiac Enzymes: No results for input(s):  CKTOTAL, CKMB, CKMBINDEX, TROPONINI in the last 168 hours.  BNP: Invalid input(s): POCBNP  CBG: Recent Labs  Lab 07/04/18 1120 07/04/18 1631 07/04/18 2052 07/17/2018 0519 07/03/2018 0612  GLUCAP 133* 141* 135* 125* 126*    Microbiology: Results for orders placed or performed during the hospital encounter of 06/24/2018  Aerobic Culture (superficial specimen)     Status: None   Collection Time: 06/26/2018  6:51 PM  Result Value Ref Range Status   Specimen Description WOUND LEFT GROIN  Final   Special Requests NONE  Final   Gram Stain   Final    ABUNDANT WBC PRESENT,BOTH PMN AND MONONUCLEAR NO ORGANISMS SEEN Performed at [facility], [place] [address] [place], [place] [address]    Culture FEW KLEBSIELLA OXYTOCA  Final   Report Status 06/27/2018 FINAL  Final   Organism ID, Bacteria KLEBSIELLA OXYTOCA  Final      Susceptibility   Klebsiella oxytoca - MIC*    AMPICILLIN >=32 RESISTANT Resistant     CEFAZOLIN <=4 SENSITIVE Sensitive     CEFEPIME <=1 SENSITIVE Sensitive     CEFTAZIDIME <=1 SENSITIVE Sensitive     CEFTRIAXONE <=1 SENSITIVE Sensitive     CIPROFLOXACIN <=0.25 SENSITIVE Sensitive     GENTAMICIN <=1 SENSITIVE Sensitive     IMIPENEM <=0.25 SENSITIVE Sensitive     TRIMETH/SULFA <=20 SENSITIVE Sensitive     AMPICILLIN/SULBACTAM 8 SENSITIVE Sensitive     PIP/TAZO <=4 SENSITIVE Sensitive     Extended ESBL NEGATIVE Sensitive     * FEW KLEBSIELLA OXYTOCA    Coagulation Studies: No results for input(s): LABPROT, INR in the last 72 hours.  Urinalysis: No results for input(s): COLORURINE, LABSPEC, PHURINE, GLUCOSEU, HGBUR, BILIRUBINUR, KETONESUR, PROTEINUR, UROBILINOGEN, NITRITE, LEUKOCYTESUR in the last 72 hours.  Invalid input(s): APPERANCEUR    Imaging: No results found.   Medications:   .  ceFAZolin (ANCEF) IV Stopped (07/02/18 1206)  . magnesium sulfate 1 - 4 g bolus IVPB    . vancomycin 1,000 mg (06/29/18 0923)   . acidophilus  1 capsule Oral Daily  .  amiodarone  200 mg Oral BID  . aspirin EC  81 mg Oral Daily  . atorvastatin  40 mg Oral BID  . calcitRIOL  0.5 mcg Oral Q M,W,F-HD  . Chlorhexidine Gluconate Cloth  6 each Topical Q0600  . Chlorhexidine Gluconate Cloth  6 each Topical Q0600  . [START ON 07/06/2018] darbepoetin (ARANESP) injection - NON-DIALYSIS  100 mcg Subcutaneous Q Fri-1800  . docusate sodium  100  mg Oral Daily  . feeding supplement (NEPRO CARB STEADY)  237 mL Oral TID WC  . feeding supplement (PRO-STAT SUGAR FREE 64)  30 mL Oral BID  . ferric citrate  420 mg Oral TID WC  . FLUoxetine  20 mg Oral Daily  . gabapentin  100 mg Oral TID  . insulin aspart  0-9 Units Subcutaneous TID WC  . levalbuterol  0.63 mg Inhalation BID  . metoprolol tartrate  12.5 mg Oral BID  . midodrine  10 mg Oral BID WC  . multivitamin  1 tablet Oral QHS  . pantoprazole  40 mg Oral Daily   acetaminophen **OR** acetaminophen, bisacodyl, guaiFENesin-dextromethorphan, HYDROmorphone (DILAUDID) injection, labetalol, lidocaine (PF), magnesium sulfate 1 - 4 g bolus IVPB, nitroGLYCERIN, ondansetron, oxycodone, oxyCODONE-acetaminophen, phenol, polyethylene glycol  Assessment/ Plan:   Bleeding and infection left groin wound.  Status post left femoropopliteal bypass subsequent infection with graft removal 06/15/2018.  Admitted with bleeding and sepsis presented 06/10/2018.  Taken for repair of bleeding by V VS. he underwent for the repair for 07/17/2018 emergently due to recurrent bleeding.  Appreciate assistance from Dr. Donnetta Hutching.  Cultures positive for Klebsiella oxytocin and MRSA.  Continues IV Ancef and vancomycin.  Date started appears to be 06/24/2018.  I would anticipate will need at least 3 weeks of antibiotics which would be 07/16/2018  End-stage renal disease Monday Wednesday Friday dialysis AV fistula working well.  Volume overload with noncompliance with fluid restriction extra dialysis treatment 07/21/2018, successful dialysis treatment 07/04/2018 with 4 L  removed  Acute blood loss anemia status post 3 units packed red blood cells.  Initiated darbepoetin and received IV iron.  He is received an additional 2 units of packed red blood cells 07/18/2018.  Total transfusion 5 units  Nutrition stable with vitamin supplementation  Bones continue calcitriol 0.5 mcg Monday Wednesday Friday.  Well-controlled calcium phosphorus balance  COPD continues on home oxygen  Chronic systolic heart failure with an ejection fraction 25 to 30% continues on Midrin 10 mg twice daily for blood pressure support  Chronic atrial fibrillation amiodarone and was on Eliquis this is been discontinued no anticoagulation at this present time.  Neuropathy Neurontin 100 mg 3 times daily dosed appropriately for renal failure  Diabetes mellitus as per primary team   LOS: [place] @TODAY @7 :55 AM

## 2018-07-06 ENCOUNTER — Encounter (HOSPITAL_COMMUNITY): Payer: Self-pay | Admitting: Vascular Surgery

## 2018-07-06 ENCOUNTER — Inpatient Hospital Stay (HOSPITAL_COMMUNITY): Payer: Medicare Other

## 2018-07-06 LAB — BPAM RBC
Blood Product Expiration Date: 202004242359
Blood Product Expiration Date: 202004252359
ISSUE DATE / TIME: 202004090515
ISSUE DATE / TIME: 202004090515
Unit Type and Rh: 5100
Unit Type and Rh: 5100

## 2018-07-06 LAB — BLOOD GAS, ARTERIAL
Acid-Base Excess: 0.4 mmol/L (ref 0.0–2.0)
Bicarbonate: 25.4 mmol/L (ref 20.0–28.0)
Drawn by: 44137
FIO2: 100
O2 Content: 15 L/min
O2 Saturation: 96.9 %
Patient temperature: 98.7
pCO2 arterial: 47.5 mmHg (ref 32.0–48.0)
pH, Arterial: 7.348 — ABNORMAL LOW (ref 7.350–7.450)
pO2, Arterial: 89.4 mmHg (ref 83.0–108.0)

## 2018-07-06 LAB — BASIC METABOLIC PANEL
Anion gap: 12 (ref 5–15)
BUN: 45 mg/dL — ABNORMAL HIGH (ref 6–20)
CO2: 24 mmol/L (ref 22–32)
Calcium: 8.7 mg/dL — ABNORMAL LOW (ref 8.9–10.3)
Chloride: 97 mmol/L — ABNORMAL LOW (ref 98–111)
Creatinine, Ser: 5.39 mg/dL — ABNORMAL HIGH (ref 0.61–1.24)
GFR calc Af Amer: 13 mL/min — ABNORMAL LOW (ref >60)
GFR calc non Af Amer: 11 mL/min — ABNORMAL LOW (ref >60)
Glucose, Bld: 100 mg/dL — ABNORMAL HIGH (ref 70–99)
Potassium: 4.7 mmol/L (ref 3.5–5.1)
Sodium: 133 mmol/L — ABNORMAL LOW (ref 135–145)

## 2018-07-06 LAB — CBC
HCT: 29.3 % — ABNORMAL LOW (ref 39.0–52.0)
Hemoglobin: 9.5 g/dL — ABNORMAL LOW (ref 13.0–17.0)
MCH: 29.3 pg (ref 26.0–34.0)
MCHC: 32.4 g/dL (ref 30.0–36.0)
MCV: 90.4 fL (ref 80.0–100.0)
Platelets: 235 10*3/uL (ref 150–400)
RBC: 3.24 MIL/uL — ABNORMAL LOW (ref 4.22–5.81)
RDW: 17.3 % — ABNORMAL HIGH (ref 11.5–15.5)
WBC: 11.4 10*3/uL — ABNORMAL HIGH (ref 4.0–10.5)
nRBC: 0 % (ref 0.0–0.2)

## 2018-07-06 LAB — TYPE AND SCREEN
ABO/RH(D): O POS
Antibody Screen: NEGATIVE
Unit division: 0
Unit division: 0

## 2018-07-06 LAB — LACTIC ACID, PLASMA
Lactic Acid, Venous: 1.4 mmol/L (ref 0.5–1.9)
Lactic Acid, Venous: 1.2 mmol/L (ref 0.5–1.9)

## 2018-07-06 LAB — GLUCOSE, CAPILLARY
Glucose-Capillary: 95 mg/dL (ref 70–99)
Glucose-Capillary: 122 mg/dL — ABNORMAL HIGH (ref 70–99)
Glucose-Capillary: 106 mg/dL — ABNORMAL HIGH (ref 70–99)

## 2018-07-06 LAB — VANCOMYCIN, RANDOM: Vancomycin Rm: 23

## 2018-07-06 MED ORDER — VANCOMYCIN HCL IN DEXTROSE 1-5 GM/200ML-% IV SOLN
INTRAVENOUS | Status: AC
  Administered 2018-07-06: 1000 mg
  Filled 2018-07-06: qty 200

## 2018-07-06 MED ORDER — HEPARIN SODIUM (PORCINE) 5000 UNIT/ML IJ SOLN: 5000.0000 [IU] | Freq: Three times a day (TID) | INTRAMUSCULAR | Status: DC

## 2018-07-06 MED ORDER — CHLORHEXIDINE GLUCONATE CLOTH 2 % EX PADS: 6.0000 | MEDICATED_PAD | Freq: Every day | CUTANEOUS | Status: DC

## 2018-07-06 MED ORDER — HEPARIN SODIUM (PORCINE) 5000 UNIT/ML IJ SOLN
5000.0000 [IU] | Freq: Three times a day (TID) | INTRAMUSCULAR | Status: DC
  Administered 2018-07-06 – 2018-07-11 (×12): 5000 [IU] via SUBCUTANEOUS
  Filled 2018-07-06 (×12): qty 1

## 2018-07-06 NOTE — Progress Notes (Addendum)
While rolling patient for bathing, patient became slightly short of breath with sats dropping into the low 80's on 4L [place].  Patient's breath sounds were noted to be very wet also.  Attending was paged.  RR was called and RT was called.  Patient was placed on 100% nonrebreather and suctioned by RT with sats coming up to 100%.  CXR, lactic acid and blood gas were ordered.  MD requested patient go do dialysis asap.  Dialysis was called and report was given to HD nurse receiving pt.  Patient is currently comfortable with sats 100% and breathing unlabored.  Will continue to monitor.

## 2018-07-06 NOTE — Consult Note (Addendum)
[place] Nurse wound follow up NPWT (VAC) changed this AM per previous schedule.  I was not aware the vascular team had changed yesterday due to emergent bleeding. Bedside RN in room administering meds while I am in there.   No bleeding today with dressing change.   Measurements:  10 cm x 3 cm x 0.5 cm  Per Vascular notes, will require amputation. [place] team will not be here 07/23/2018.  Bedside RN to perform dressing change if MD requires.  (Was changed 07/06/18).  NO further needs at this time. Patient to dialysis today.  [place] team will follow.  Domenic Moras MSN, RN, FNP-BC CWON Wound, Ostomy, Continence Nurse Pager [phone]

## 2018-07-06 NOTE — Anesthesia Preprocedure Evaluation (Addendum)
Anesthesia Evaluation  Patient identified by MRN, date of birth, ID band Patient awake    Reviewed: Allergy & Precautions, Patient's Chart, lab work & pertinent test results, Unable to perform ROS - Chart review onlyPreop documentation limited or incomplete due to emergent nature of procedure.  History of Anesthesia Complications Negative for: history of anesthetic complications  Airway Mallampati: II  TM Distance: >3 FB Neck ROM: Full    Dental  (+) Edentulous Upper   Pulmonary asthma , sleep apnea , COPD, Current Smoker,    breath sounds clear to auscultation       Cardiovascular hypertension, Pt. on home beta blockers and Pt. on medications + CAD, + Past MI, + Cardiac Stents, + CABG, + Peripheral Vascular Disease and +CHF  + dysrhythmias Atrial Fibrillation + Cardiac Defibrillator  Rhythm:Regular Rate:Normal   '19 TTE (Care Everywhere) - Mild left ventricular hypertrophy. The left ventricle is moderately dilated.  LV ejection fraction = 25-30%. Left ventricular filling pattern is prolonged relaxation. There is severe global hypokinesis of the left ventricle. LA is mildly dilated. Mild TR. No pulmonary hypertension. There is a pleural effusion present.     Neuro/Psych PSYCHIATRIC DISORDERS Anxiety Depression negative neurological ROS     GI/Hepatic Neg liver ROS, hiatal hernia, GERD  Medicated,  Endo/Other  diabetes, Insulin DependentHypothyroidism  Hyponatremia  Renal/GU ESRF and DialysisRenal disease     Musculoskeletal  (+) Arthritis ,   Abdominal   Peds  Hematology  (+) anemia ,   Anesthesia Other Findings   Reproductive/Obstetrics                             Anesthesia Physical Anesthesia Plan  ASA: IV  Anesthesia Plan: General   Post-op Pain Management:    Induction: Intravenous and Rapid sequence  PONV Risk Score and Plan: 2 and Ondansetron and Treatment may vary due to  age or medical condition  Airway Management Planned: Oral ETT  Additional Equipment: None  Intra-op Plan:   Post-operative Plan: Extubation in OR  Informed Consent: I have reviewed the patients History and Physical, chart, labs and discussed the procedure including the risks, benefits and alternatives for the proposed anesthesia with the patient or authorized representative who has indicated his/her understanding and acceptance.     Dental advisory given  Plan Discussed with: CRNA  Anesthesia Plan Comments:         Anesthesia Quick Evaluation

## 2018-07-06 NOTE — Progress Notes (Signed)
Pharmacy Antibiotic Note  Jose Sellers is a 58 y.o. male admitted on 05/30/2018 with klebsiella oxytoca wound infection (s/p left fem pop bypass and subsequent infection).   He also had a recent MRSA infection (infected bypass) and was on vancomycin PTA and continues on this. Pharmacy has been consulted for vancomycin dosing.  He is noted with ESRD with last HD on 4/8. Noted plans to continue for at least 3 weeks (end date 07/16/18).  -pre-HD vancomycin level= 23  Plan: Continue vancomycin 1g IV QHD-MWF Continue Ancef 2gmIV MWF with HD,  Will follow patient progress  Height: 6' (182.9 cm) Weight: 167 lb 12.3 oz (76.1 kg) IBW/kg (Calculated) : 77.6  Temp (24hrs), Avg:98.3 F (36.8 C), Min:97.4 F (36.3 C), Max:99 F (37.2 C)  Recent Labs  Lab 07/07/2018 0732 07/26/2018 1644 07/01/18 0007 07/02/18 0829 07/02/18 0830 07/02/18 1345 07/04/18 0707 07/06/18 0427 07/06/18 1120  WBC  --  11.5* 10.3 11.6*  --  13.8* 8.5 11.4*  --   CREATININE 3.81* 2.72*  --   --  4.64*  --  5.29* 5.39*  --   VANCORANDOM  --   --   --   --   --   --   --   --  23    Estimated Creatinine Clearance: 16.5 mL/min (A) (by C-G formula based on SCr of 5.39 mg/dL (H)).    Allergies  Allergen Reactions  . Albuterol Palpitations    SVT  . Prednisone Shortness Of Breath    Wife stated that patient develops breathing issues.  . Fluticasone Furoate-Vilanterol Palpitations  . Morphine Rash    Hildred Laser, PharmD Clinical Pharmacist **Pharmacist phone directory can now be found on [email or web address] ([email or web address]).  Listed under [place].

## 2018-07-06 NOTE — Significant Event (Signed)
Rapid Response Event Note  Overview: Time Called: 1115 Arrival Time: 4360 Event Type: Respiratory  Initial Focused Assessment: RN called RR, patient in respiratory distress, sats 82% on 6L [place].  RN states patient has had a complicated admission with wound healing at femoral site.  No distress noted prior in shift, patient for HD later today.    On arrival, patient in bed, labored breathing with use of accessory muscles, lethargic, arousable and attempts to follow commands of coughing, unable to cough with force.  Coarse bilateral breath sounds, including throat.    Interventions: -- Patient placed on NRB 15L, sats 90% -- NTS and OTS performed, thick tan secretions suctioned, tolerated well, sats improved to 100% on NRB -- RN notified Matt PA with vascular -- Orders for PCXR and ABG stat.  -- Temp 98.7 oral   Plan of Care (if not transferred): ABG results slightly acidotic, otherwise wnl PCX showed some pulm edema, Per MD will go for STAT HD Discussed with RN to call RRT as needed for increased distress, Bipap could help if needed. Not to wean O2 at this time.    Event Summary: Name of Physician Notified: Matt PA Vascular at 1125    at       Event End Time: [place]  Nalda Shackleford, [name]

## 2018-07-06 NOTE — Progress Notes (Signed)
Call patient's wife for an update about patient not able to eat. Also ask authorization to sign the consent for the AKA tomorrow morning. Teola Bradley, RN as witness

## 2018-07-06 NOTE — Progress Notes (Addendum)
  Progress Note    07/06/2018 7:50 AM 1 Day Post-Op  Subjective:  No complaints of excessive pain LLE   Vitals:   07/06/18 0032 07/06/18 0325  BP:  117/69  Pulse:  71  Resp:  17  Temp:  98.2 F (36.8 C)  SpO2: 100% 98%   Physical Exam: Lungs:  Non labored with O2 by [place] Incisions:  L groin incision with wound vac in place; 150cc serous drainage in vac Extremities:  L foot cold to level of knee with ischemic tissue changes and mottling to proximal calf, no motor or sensation Neurologic: A&O  CBC    Component Value Date/Time   WBC 11.4 (H) 07/06/2018 0427   RBC 3.24 (L) 07/06/2018 0427   HGB 9.5 (L) 07/06/2018 0427   HCT 29.3 (L) 07/06/2018 0427   PLT 235 07/06/2018 0427   MCV 90.4 07/06/2018 0427   MCH 29.3 07/06/2018 0427   MCHC 32.4 07/06/2018 0427   RDW 17.3 (H) 07/06/2018 0427   LYMPHSABS 1.2 07/24/2018 1644   MONOABS 0.9 07/26/2018 1644   EOSABS 0.1 07/22/2018 1644   BASOSABS 0.0 07/23/2018 1644    BMET    Component Value Date/Time   NA 133 (L) 07/06/2018 0427   K 4.7 07/06/2018 0427   CL 97 (L) 07/06/2018 0427   CO2 24 07/06/2018 0427   GLUCOSE 100 (H) 07/06/2018 0427   BUN 45 (H) 07/06/2018 0427   CREATININE 5.39 (H) 07/06/2018 0427   CALCIUM 8.7 (L) 07/06/2018 0427   GFRNONAA 11 (L) 07/06/2018 0427   GFRAA 13 (L) 07/06/2018 0427    INR    Component Value Date/Time   INR 1.5 (H) 05/31/2018 1939     Intake/Output Summary (Last 24 hours) at 07/06/2018 0750 Last data filed at 07/06/2018 0655 Gross per 24 hour  Intake 222 ml  Output 25 ml  Net 197 ml     Assessment/Plan:  58 y.o. male is s/p re bleed x3 L groin, ligated L EIA and profunda 1 Day Post-Op   Vac change tomorrow L groin Mottling and ischemic tissue changes up to the L knee, temperature change above knee Patient aware he will require L AKA with high risk of nonhealing and need for hip disarticulation Will discuss timing with Dr. Terri Skains, PA-C [facility] [phone] 07/06/2018 7:50 AM  Pt leg is ice cold to the knee.  Surprisingly no pain complaints.  He was short of breath earlier but this is much improved with supplemental oxygen.  Hopefully come off this with HD later today. CXR shows pulm edema.  ABG PO2 89 CO2 48 ph 7.35  If stable overnight will proceed with left AKA tomorrow  Ruta Hinds, MD [facility] [place]: [phone] Pager: [phone]

## 2018-07-06 NOTE — Progress Notes (Signed)
[facility] ROUNDING NOTE   Subjective:   This is a 58 year old gentleman end-stage renal disease Monday Wednesday Friday dialysis.  He was admitted 06/24/2018 for repair of bleeding left femoropopliteal bypass site.  He had undergone evaluation and graft removal with vein patch 06/15/2018.  He has noncompliance with his dialysis and fluid restriction.  He received an extra dialysis treatment Saturday for 06/2018.  He underwent successful dialysis 07/04/2018 with 4.273 L removed.  Weight 76.6 kg.  I appreciate assistance from Dr. Donnetta Hutching.  There is recurrent bleeding from the left femoral artery anastomosis.  He went left groin exploration oversewing of left profunda with placement of wound VAC 07/26/2018 as an emergency procedure following bleeding from left groin.  Blood pressure 110/93 pulse 77 temperature 98.8 O2 sats 92% 3 L  Sodium 133 potassium 4.7 chloride 97 CO2 24 BUN 45 creatinine 5.39 glucose 100 calcium 8.7 WBC 11.4 hemoglobin 9.5 platelets of 235   Cultures MRSA 06/14/2018 and Klebsiella oxytocin 06/10/2018   wound cultures.  Aspirin 81 mg daily, Ancef 2 g Monday Wednesday Friday, vancomycin 1 g Monday Wednesday Friday, amiodarone 200 mg twice daily, atorvastatin 40 mg twice daily, metoprolol 12.5 mg twice daily, Midodrin 10 mg twice daily, Prozac 20 mg daily, calcitriol 0.5 mcg Monday Wednesday Friday, insulin sliding scale, darbepoetin 100 mcg every 2 weeks, Neurontin 100 mg 3 times daily, Xopenex nebulizer 0.63 mg twice daily.  Nutritional supplementation Protostat sugar-free 30 cc twice daily, Rena-Vite 1 daily  Objective:  Vital signs in last 24 hours:  Temp:  [97.4 F (36.3 C)-99 F (37.2 C)] 98.8 F (37.1 C) (04/10 0831) Pulse Rate:  [65-94] 78 (04/10 0930) Resp:  [9-21] 20 (04/10 0930) BP: (102-123)/(60-98) 110/93 (04/10 0831) SpO2:  [86 %-100 %] 92 % (04/10 0930) Weight:  [76.1 kg] 76.1 kg (04/10 0313)  Weight change: -0.5 kg Filed Weights   07/04/18 0615  07/04/18 1100 07/06/18 0313  Weight: 80.7 kg 76.6 kg 76.1 kg    Intake/Output: I/O last 3 completed shifts: In: 622 [P.O.:222; I.V.:400] Out: 400 [Drains:200; Blood:200]   Intake/Output this shift:  No intake/output data recorded. Chronically ill-appearing gentleman CVS-  regular rate and rhythm with no JVP distention RS- CTA no wheezes or rales ABD- BS present soft non-distended EXT- no edema left groin wound with wound VAC.  Right AV fistula with thrill and bruit  Dialysis Monday Wednesday Friday 4 hours.  350/1.5   EDW 78 kg   2/2.25 bath right AV fistula no heparin  Venofer 100 mg weekly  no ESA Calcitrol 0.5 mcg Monday Wednesday Friday   Basic Metabolic Panel: Recent Labs  Lab 07/04/2018 0732 07/14/2018 1644  07/06/2018 1821 07/02/18 0830 07/04/18 0707 07/06/2018 0345 07/06/18 0427  NA 131* 134*   < > 135 129* 130* 133* 133*  K 4.0 3.4*   < > 3.7 4.3 4.3 4.7 4.7  CL 95* 100  --   --  96* 94*  --  97*  CO2 27 29  --   --  23 25  --  24  GLUCOSE 125* 120*  --  123* 117* 119* 140* 100*  BUN 24* 16  --   --  28* 40*  --  45*  CREATININE 3.81* 2.72*  --   --  4.64* 5.29*  --  5.39*  CALCIUM 8.4* 7.6*  --   --  8.4* 8.8*  --  8.7*  PHOS 2.7  --   --   --  3.2 3.1  --   --    < > =  values in this interval not displayed.    Liver Function Tests: Recent Labs  Lab 07/16/2018 0732 07/02/18 0830 07/04/18 0707  ALBUMIN 2.0* 2.0* 1.9*   No results for input(s): LIPASE, AMYLASE in the last 168 hours. No results for input(s): AMMONIA in the last 168 hours.  CBC: Recent Labs  Lab 07/24/2018 1644  07/01/18 0007 07/02/18 0829 07/02/18 1345 07/04/18 0707 07/15/2018 0345 07/06/18 0427  WBC 11.5*  --  10.3 11.6* 13.8* 8.5  --  11.4*  NEUTROABS 9.2*  --   --   --   --   --   --   --   HGB 6.8*   < > 7.6* 7.3* 8.3* 7.4* 8.8* 9.5*  HCT 21.3*   < > 24.0* 23.8* 26.6* 23.5* 26.0* 29.3*  MCV 88.0  --  87.0 91.2 90.5 93.3  --  90.4  PLT 286  --  213 248 264 236  --  235   < > =  values in this interval not displayed.    Cardiac Enzymes: No results for input(s): CKTOTAL, CKMB, CKMBINDEX, TROPONINI in the last 168 hours.  BNP: Invalid input(s): POCBNP  CBG: Recent Labs  Lab 07/06/2018 0612 07/24/2018 1239 07/06/2018 1702 07/03/2018 2131 07/06/18 0604  GLUCAP 126* 136* 129* 137* 95    Microbiology: Results for orders placed or performed during the hospital encounter of 06/05/2018  Aerobic Culture (superficial specimen)     Status: None   Collection Time: 06/02/2018  6:51 PM  Result Value Ref Range Status   Specimen Description WOUND LEFT GROIN  Final   Special Requests NONE  Final   Gram Stain   Final    ABUNDANT WBC PRESENT,BOTH PMN AND MONONUCLEAR NO ORGANISMS SEEN Performed at [facility], [place] [address] [place], [place] [address]    Culture FEW KLEBSIELLA OXYTOCA  Final   Report Status 06/27/2018 FINAL  Final   Organism ID, Bacteria KLEBSIELLA OXYTOCA  Final      Susceptibility   Klebsiella oxytoca - MIC*    AMPICILLIN >=32 RESISTANT Resistant     CEFAZOLIN <=4 SENSITIVE Sensitive     CEFEPIME <=1 SENSITIVE Sensitive     CEFTAZIDIME <=1 SENSITIVE Sensitive     CEFTRIAXONE <=1 SENSITIVE Sensitive     CIPROFLOXACIN <=0.25 SENSITIVE Sensitive     GENTAMICIN <=1 SENSITIVE Sensitive     IMIPENEM <=0.25 SENSITIVE Sensitive     TRIMETH/SULFA <=20 SENSITIVE Sensitive     AMPICILLIN/SULBACTAM 8 SENSITIVE Sensitive     PIP/TAZO <=4 SENSITIVE Sensitive     Extended ESBL NEGATIVE Sensitive     * FEW KLEBSIELLA OXYTOCA    Coagulation Studies: No results for input(s): LABPROT, INR in the last 72 hours.  Urinalysis: No results for input(s): COLORURINE, LABSPEC, PHURINE, GLUCOSEU, HGBUR, BILIRUBINUR, KETONESUR, PROTEINUR, UROBILINOGEN, NITRITE, LEUKOCYTESUR in the last 72 hours.  Invalid input(s): APPERANCEUR    Imaging: No results found.   Medications:   .  ceFAZolin (ANCEF) IV Stopped (07/02/18 1206)  . magnesium sulfate 1 - 4 g bolus IVPB     . vancomycin 1,000 mg (06/29/18 0923)   . acidophilus  1 capsule Oral Daily  . amiodarone  200 mg Oral BID  . aspirin EC  81 mg Oral Daily  . atorvastatin  40 mg Oral BID  . calcitRIOL  0.5 mcg Oral Q M,W,F-HD  . Chlorhexidine Gluconate Cloth  6 each Topical Q0600  . Chlorhexidine Gluconate Cloth  6 each Topical Q0600  . darbepoetin (ARANESP)  injection - NON-DIALYSIS  100 mcg Subcutaneous Q Fri-1800  . docusate sodium  100 mg Oral Daily  . feeding supplement (NEPRO CARB STEADY)  237 mL Oral TID WC  . feeding supplement (PRO-STAT SUGAR FREE 64)  30 mL Oral BID  . ferric citrate  420 mg Oral TID WC  . FLUoxetine  20 mg Oral Daily  . gabapentin  100 mg Oral TID  . insulin aspart  0-9 Units Subcutaneous TID WC  . levalbuterol  0.63 mg Inhalation BID  . metoprolol tartrate  12.5 mg Oral BID  . midodrine  10 mg Oral BID WC  . multivitamin  1 tablet Oral QHS  . pantoprazole  40 mg Oral Daily   acetaminophen **OR** acetaminophen, bisacodyl, guaiFENesin-dextromethorphan, HYDROmorphone (DILAUDID) injection, labetalol, lidocaine (PF), magnesium sulfate 1 - 4 g bolus IVPB, nitroGLYCERIN, ondansetron, oxycodone, oxyCODONE-acetaminophen, phenol, polyethylene glycol  Assessment/ Plan:   Bleeding and infection left groin wound.  Status post left femoropopliteal bypass subsequent infection with graft removal 06/15/2018.  Admitted with bleeding and sepsis presented 05/29/2018.  Taken for repair of bleeding by V VS. he underwent for the repair for 07/11/2018 emergently due to recurrent bleeding.  Appreciate assistance from Dr. Donnetta Hutching.  Cultures positive for Klebsiella oxytocin and MRSA.  Continues IV Ancef and vancomycin.  Date started appears to be 05/31/2018.  I would anticipate will need at least 3 weeks of antibiotics which would be 07/16/2018  End-stage renal disease Monday Wednesday Friday dialysis AV fistula working well.  Volume overload with noncompliance with fluid restriction extra dialysis  treatment 07/11/2018, successful dialysis treatment 07/04/2018 with 4 L removed plan dialysis 07/06/2018  Acute blood loss anemia status post 3 units packed red blood cells.  Initiated darbepoetin and received IV iron.  He is received an additional 2 units of packed red blood cells 07/24/2018.  Total transfusion 5 units  Nutrition stable with vitamin supplementation  Bones continue calcitriol 0.5 mcg Monday Wednesday Friday.  Well-controlled calcium phosphorus balance  COPD continues on home oxygen  Chronic systolic heart failure with an ejection fraction 25 to 30% continues on Midrin 10 mg twice daily for blood pressure support  Chronic atrial fibrillation amiodarone and was on Eliquis this is been discontinued no anticoagulation at this present time.  Neuropathy Neurontin 100 mg 3 times daily dosed appropriately for renal failure  Diabetes mellitus as per primary team   LOS: [place] @TODAY @9 :38 AM

## 2018-07-06 NOTE — Progress Notes (Signed)
Pt wife updated.  She is in agreement with AkA tomorrow Keep NPO p midnight Consent  Ruta Hinds, MD [facility] Office: [phone] Pager: [phone]

## 2018-07-06 NOTE — H&P (View-Only) (Signed)
  Progress Note    07/06/2018 7:50 AM 1 Day Post-Op  Subjective:  No complaints of excessive pain LLE   Vitals:   07/06/18 0032 07/06/18 0325  BP:  117/69  Pulse:  71  Resp:  17  Temp:  98.2 F (36.8 C)  SpO2: 100% 98%   Physical Exam: Lungs:  Non labored with O2 by [place] Incisions:  L groin incision with wound vac in place; 150cc serous drainage in vac Extremities:  L foot cold to level of knee with ischemic tissue changes and mottling to proximal calf, no motor or sensation Neurologic: A&O  CBC    Component Value Date/Time   WBC 11.4 (H) 07/06/2018 0427   RBC 3.24 (L) 07/06/2018 0427   HGB 9.5 (L) 07/06/2018 0427   HCT 29.3 (L) 07/06/2018 0427   PLT 235 07/06/2018 0427   MCV 90.4 07/06/2018 0427   MCH 29.3 07/06/2018 0427   MCHC 32.4 07/06/2018 0427   RDW 17.3 (H) 07/06/2018 0427   LYMPHSABS 1.2 06/29/2018 1644   MONOABS 0.9 07/02/2018 1644   EOSABS 0.1 07/20/2018 1644   BASOSABS 0.0 07/24/2018 1644    BMET    Component Value Date/Time   NA 133 (L) 07/06/2018 0427   K 4.7 07/06/2018 0427   CL 97 (L) 07/06/2018 0427   CO2 24 07/06/2018 0427   GLUCOSE 100 (H) 07/06/2018 0427   BUN 45 (H) 07/06/2018 0427   CREATININE 5.39 (H) 07/06/2018 0427   CALCIUM 8.7 (L) 07/06/2018 0427   GFRNONAA 11 (L) 07/06/2018 0427   GFRAA 13 (L) 07/06/2018 0427    INR    Component Value Date/Time   INR 1.5 (H) 06/26/2018 1939     Intake/Output Summary (Last 24 hours) at 07/06/2018 0750 Last data filed at 07/06/2018 0655 Gross per 24 hour  Intake 222 ml  Output 25 ml  Net 197 ml     Assessment/Plan:  58 y.o. male is s/p re bleed x3 L groin, ligated L EIA and profunda 1 Day Post-Op   Vac change tomorrow L groin Mottling and ischemic tissue changes up to the L knee, temperature change above knee Patient aware he will require L AKA with high risk of nonhealing and need for hip disarticulation Will discuss timing with Dr. Terri Skains, PA-C [facility] [phone] 07/06/2018 7:50 AM  Pt leg is ice cold to the knee.  Surprisingly no pain complaints.  He was short of breath earlier but this is much improved with supplemental oxygen.  Hopefully come off this with HD later today. CXR shows pulm edema.  ABG PO2 89 CO2 48 ph 7.35  If stable overnight will proceed with left AKA tomorrow  Ruta Hinds, MD [facility] [place]: [phone] Pager: [phone]

## 2018-07-06 NOTE — Progress Notes (Signed)
Pt 's wife called and she and the family had "face time" with the patient, but patient not alert enough to hold an appropriate communication with family. Wife requested to feed pt but patient not alert enough to take anything by mouth.

## 2018-07-07 ENCOUNTER — Inpatient Hospital Stay (HOSPITAL_COMMUNITY): Payer: Medicare Other | Admitting: Anesthesiology

## 2018-07-07 ENCOUNTER — Encounter (HOSPITAL_COMMUNITY): Admission: EM | Disposition: E | Payer: Self-pay | Source: Home / Self Care | Attending: Vascular Surgery

## 2018-07-07 ENCOUNTER — Inpatient Hospital Stay (HOSPITAL_COMMUNITY): Payer: Medicare Other

## 2018-07-07 ENCOUNTER — Encounter (HOSPITAL_COMMUNITY): Payer: Self-pay | Admitting: Certified Registered Nurse Anesthetist

## 2018-07-07 HISTORY — PX: AMPUTATION: SHX166

## 2018-07-07 LAB — POCT I-STAT 4, (NA,K, GLUC, HGB,HCT)
Glucose, Bld: 135 mg/dL — ABNORMAL HIGH (ref 70–99)
HCT: 28 % — ABNORMAL LOW (ref 39.0–52.0)
Hemoglobin: 9.5 g/dL — ABNORMAL LOW (ref 13.0–17.0)
Potassium: 4.7 mmol/L (ref 3.5–5.1)
Sodium: 132 mmol/L — ABNORMAL LOW (ref 135–145)

## 2018-07-07 LAB — CBC
HCT: 27.7 % — ABNORMAL LOW (ref 39.0–52.0)
Hemoglobin: 8.7 g/dL — ABNORMAL LOW (ref 13.0–17.0)
MCH: 27.9 pg (ref 26.0–34.0)
MCHC: 31.4 g/dL (ref 30.0–36.0)
MCV: 88.8 fL (ref 80.0–100.0)
Platelets: 208 10*3/uL (ref 150–400)
RBC: 3.12 MIL/uL — ABNORMAL LOW (ref 4.22–5.81)
RDW: 17.2 % — ABNORMAL HIGH (ref 11.5–15.5)
WBC: 11.1 10*3/uL — ABNORMAL HIGH (ref 4.0–10.5)
nRBC: 0 % (ref 0.0–0.2)

## 2018-07-07 LAB — GLUCOSE, CAPILLARY
Glucose-Capillary: 142 mg/dL — ABNORMAL HIGH (ref 70–99)
Glucose-Capillary: 134 mg/dL — ABNORMAL HIGH (ref 70–99)
Glucose-Capillary: 152 mg/dL — ABNORMAL HIGH (ref 70–99)
Glucose-Capillary: 109 mg/dL — ABNORMAL HIGH (ref 70–99)
Glucose-Capillary: 110 mg/dL — ABNORMAL HIGH (ref 70–99)

## 2018-07-07 SURGERY — AMPUTATION, ABOVE KNEE
Anesthesia: General | Laterality: Left

## 2018-07-07 MED ORDER — ONDANSETRON HCL 4 MG/2ML IJ SOLN
INTRAMUSCULAR | Status: AC
  Filled 2018-07-07: qty 2

## 2018-07-07 MED ORDER — FENTANYL CITRATE (PF) 250 MCG/5ML IJ SOLN
INTRAMUSCULAR | Status: AC
  Filled 2018-07-07: qty 5

## 2018-07-07 MED ORDER — PROPOFOL 10 MG/ML IV BOLUS
INTRAVENOUS | Status: AC
  Filled 2018-07-07: qty 20

## 2018-07-07 MED ORDER — PROPOFOL 10 MG/ML IV BOLUS
INTRAVENOUS | Status: DC | PRN
  Administered 2018-07-07: 100 mg via INTRAVENOUS

## 2018-07-07 MED ORDER — ONDANSETRON HCL 4 MG/2ML IJ SOLN
INTRAMUSCULAR | Status: DC | PRN
  Administered 2018-07-07: 4 mg via INTRAVENOUS

## 2018-07-07 MED ORDER — 0.9 % SODIUM CHLORIDE (POUR BTL) OPTIME
TOPICAL | Status: DC | PRN
  Administered 2018-07-07: 1000 mL

## 2018-07-07 MED ORDER — SUCCINYLCHOLINE CHLORIDE 200 MG/10ML IV SOSY
PREFILLED_SYRINGE | INTRAVENOUS | Status: AC
  Filled 2018-07-07: qty 10

## 2018-07-07 MED ORDER — LIDOCAINE 2% (20 MG/ML) 5 ML SYRINGE
INTRAMUSCULAR | Status: DC | PRN
  Administered 2018-07-07: 100 mg via INTRAVENOUS

## 2018-07-07 MED ORDER — HYDROMORPHONE HCL 1 MG/ML IJ SOLN
INTRAMUSCULAR | Status: AC
  Administered 2018-07-07: 0.25 mg via INTRAVENOUS
  Filled 2018-07-07: qty 1

## 2018-07-07 MED ORDER — FENTANYL CITRATE (PF) 100 MCG/2ML IJ SOLN
INTRAMUSCULAR | Status: DC | PRN
  Administered 2018-07-07: 100 ug via INTRAVENOUS

## 2018-07-07 MED ORDER — EPHEDRINE 5 MG/ML INJ
INTRAVENOUS | Status: AC
  Filled 2018-07-07: qty 10

## 2018-07-07 MED ORDER — MEPERIDINE HCL 50 MG/ML IJ SOLN: 6.2500 mg | INTRAMUSCULAR | Status: DC | PRN

## 2018-07-07 MED ORDER — EPHEDRINE SULFATE-NACL 50-0.9 MG/10ML-% IV SOSY
PREFILLED_SYRINGE | INTRAVENOUS | Status: DC | PRN
  Administered 2018-07-07: 10 mg via INTRAVENOUS
  Administered 2018-07-07: 5 mg via INTRAVENOUS
  Administered 2018-07-07 (×2): 10 mg via INTRAVENOUS

## 2018-07-07 MED ORDER — SODIUM CHLORIDE 0.9 % IV SOLN
INTRAVENOUS | Status: DC | PRN
  Administered 2018-07-07: 08:00:00 via INTRAVENOUS

## 2018-07-07 MED ORDER — PHENYLEPHRINE 40 MCG/ML (10ML) SYRINGE FOR IV PUSH (FOR BLOOD PRESSURE SUPPORT)
PREFILLED_SYRINGE | INTRAVENOUS | Status: AC
  Filled 2018-07-07: qty 10

## 2018-07-07 MED ORDER — PROMETHAZINE HCL 25 MG/ML IJ SOLN: 6.2500 mg | INTRAMUSCULAR | Status: DC | PRN

## 2018-07-07 MED ORDER — CALCIUM CHLORIDE 10 % IV SOLN
INTRAVENOUS | Status: AC
  Filled 2018-07-07: qty 10

## 2018-07-07 MED ORDER — PHENYLEPHRINE 40 MCG/ML (10ML) SYRINGE FOR IV PUSH (FOR BLOOD PRESSURE SUPPORT)
PREFILLED_SYRINGE | INTRAVENOUS | Status: DC | PRN
  Administered 2018-07-07: 120 ug via INTRAVENOUS
  Administered 2018-07-07: 80 ug via INTRAVENOUS
  Administered 2018-07-07: 40 ug via INTRAVENOUS
  Administered 2018-07-07: 120 ug via INTRAVENOUS

## 2018-07-07 MED ORDER — CHLORHEXIDINE GLUCONATE 4 % EX LIQD
CUTANEOUS | Status: AC
  Filled 2018-07-07: qty 60

## 2018-07-07 MED ORDER — SUCCINYLCHOLINE CHLORIDE 200 MG/10ML IV SOSY
PREFILLED_SYRINGE | INTRAVENOUS | Status: DC | PRN
  Administered 2018-07-07: 100 mg via INTRAVENOUS

## 2018-07-07 MED ORDER — MIDAZOLAM HCL 2 MG/2ML IJ SOLN
INTRAMUSCULAR | Status: AC
  Filled 2018-07-07: qty 2

## 2018-07-07 MED ORDER — HYDROMORPHONE HCL 1 MG/ML IJ SOLN
0.2500 mg | INTRAMUSCULAR | Status: DC | PRN
  Administered 2018-07-07: 10:00:00 0.25 mg via INTRAVENOUS

## 2018-07-07 MED ORDER — LIDOCAINE 2% (20 MG/ML) 5 ML SYRINGE
INTRAMUSCULAR | Status: AC
  Filled 2018-07-07: qty 5

## 2018-07-07 SURGICAL SUPPLY — 43 items
BANDAGE ACE 6X5 VEL STRL LF (GAUZE/BANDAGES/DRESSINGS) ×2 IMPLANT
BANDAGE ELASTIC 6 VELCRO ST LF (GAUZE/BANDAGES/DRESSINGS) ×1 IMPLANT
BLADE SAW RECIP 87.9 MT (BLADE) ×2 IMPLANT
BNDG COHESIVE 4X5 TAN STRL (GAUZE/BANDAGES/DRESSINGS) ×2 IMPLANT
BNDG COHESIVE 6X5 TAN STRL LF (GAUZE/BANDAGES/DRESSINGS) ×2 IMPLANT
BNDG GAUZE ELAST 4 BULKY (GAUZE/BANDAGES/DRESSINGS) ×2 IMPLANT
CANISTER SUCT 3000ML PPV (MISCELLANEOUS) ×2 IMPLANT
CLIP VESOCCLUDE MED 6/CT (CLIP) IMPLANT
COVER BACK TABLE 60X90IN (DRAPES) ×2 IMPLANT
COVER SURGICAL LIGHT HANDLE (MISCELLANEOUS) ×2 IMPLANT
COVER WAND RF STERILE (DRAPES) ×2 IMPLANT
DRAIN CHANNEL 19F RND (DRAIN) IMPLANT
DRAPE HALF SHEET 40X57 (DRAPES) ×2 IMPLANT
DRAPE ORTHO SPLIT 77X108 STRL (DRAPES) ×2
DRAPE SURG ORHT 6 SPLT 77X108 (DRAPES) ×2 IMPLANT
DRSG ADAPTIC 3X8 NADH LF (GAUZE/BANDAGES/DRESSINGS) ×2 IMPLANT
ELECT CAUTERY BLADE 6.4 (BLADE) ×2 IMPLANT
ELECT REM PT RETURN 9FT ADLT (ELECTROSURGICAL) ×2
ELECTRODE REM PT RTRN 9FT ADLT (ELECTROSURGICAL) ×1 IMPLANT
EVACUATOR SILICONE 100CC (DRAIN) IMPLANT
GAUZE SPONGE 4X4 12PLY STRL (GAUZE/BANDAGES/DRESSINGS) ×2 IMPLANT
GAUZE SPONGE 4X4 12PLY STRL LF (GAUZE/BANDAGES/DRESSINGS) ×1 IMPLANT
GLOVE BIO SURGEON STRL SZ7.5 (GLOVE) ×2 IMPLANT
GOWN STRL REUS W/ TWL LRG LVL3 (GOWN DISPOSABLE) ×3 IMPLANT
GOWN STRL REUS W/TWL LRG LVL3 (GOWN DISPOSABLE) ×3
KIT BASIN OR (CUSTOM PROCEDURE TRAY) ×2 IMPLANT
KIT TURNOVER KIT B (KITS) ×2 IMPLANT
NS IRRIG 1000ML POUR BTL (IV SOLUTION) ×2 IMPLANT
PACK GENERAL/GYN (CUSTOM PROCEDURE TRAY) ×2 IMPLANT
PAD ARMBOARD 7.5X6 YLW CONV (MISCELLANEOUS) ×4 IMPLANT
STAPLER VISISTAT 35W (STAPLE) ×2 IMPLANT
STOCKINETTE IMPERVIOUS LG (DRAPES) ×2 IMPLANT
SUT ETHILON 3 0 PS 1 (SUTURE) IMPLANT
SUT SILK 2 0 SH (SUTURE) IMPLANT
SUT SILK 2 0 SH CR/8 (SUTURE) ×2 IMPLANT
SUT SILK 2 0 TIES 10X30 (SUTURE) ×2 IMPLANT
SUT VIC AB 2-0 CT1 18 (SUTURE) IMPLANT
SUT VIC AB 2-0 SH 18 (SUTURE) ×5 IMPLANT
SUT VIC AB 3-0 SH 27 (SUTURE) ×2
SUT VIC AB 3-0 SH 27X BRD (SUTURE) ×2 IMPLANT
TOWEL GREEN STERILE (TOWEL DISPOSABLE) ×4 IMPLANT
UNDERPAD 30X30 (UNDERPADS AND DIAPERS) ×2 IMPLANT
WATER STERILE IRR 1000ML POUR (IV SOLUTION) ×2 IMPLANT

## 2018-07-07 NOTE — Transfer of Care (Signed)
Immediate Anesthesia Transfer of Care Note  Patient: Jose Sellers  Procedure(s) Performed: LEFT ABOVE KNEE AMPUTATION (Left )  Patient Location: PACU  Anesthesia Type:General  Level of Consciousness: patient cooperative and responds to stimulation  Airway & Oxygen Therapy: Patient Spontanous Breathing and Patient connected to nasal cannula oxygen  Post-op Assessment: Report given to RN and Post -op Vital signs reviewed and stable  Post vital signs: Reviewed and stable  Last Vitals:  Vitals Value Taken Time  BP 136/69 07/03/2018  9:25 AM  Temp    Pulse 80 07/11/2018  9:25 AM  Resp 24 07/04/2018  9:25 AM  SpO2 98 % 07/03/2018  9:25 AM  Vitals shown include unvalidated device data.  Last Pain:  Vitals:   06/27/2018 0443  TempSrc: Oral  PainSc:       Patients Stated Pain Goal: 0 (84/03/97 9536)  Complications: No apparent anesthesia complications

## 2018-07-07 NOTE — Anesthesia Procedure Notes (Signed)
Procedure Name: Intubation Date/Time: 06/27/2018 7:56 AM Performed by: Verdie Drown, CRNA Pre-anesthesia Checklist: Patient identified, Emergency Drugs available, Suction available and Patient being monitored Patient Re-evaluated:Patient Re-evaluated prior to induction Oxygen Delivery Method: Circle System Utilized Preoxygenation: Pre-oxygenation with 100% oxygen Induction Type: IV induction Laryngoscope Size: Mac and 3 Grade View: Grade I Tube type: Oral Tube size: 7.5 mm Number of attempts: 1 Airway Equipment and Method: Stylet and Oral airway Placement Confirmation: ETT inserted through vocal cords under direct vision,  positive ETCO2 and breath sounds checked- equal and bilateral Tube secured with: Tape Dental Injury: Teeth and Oropharynx as per pre-operative assessment

## 2018-07-07 NOTE — Progress Notes (Signed)
PT received from PACU. Pt has compression wrap to L aka. Femoral pulse present/extremity warm. Wound vac in place from previous surgery to L groin. Pt alert. Vitals stable. Will continue to monitor.  Jerald Kief, RN

## 2018-07-07 NOTE — Progress Notes (Signed)
Verified name and DOB with patient. Consent was a telephone consent that matches posting.

## 2018-07-07 NOTE — Op Note (Signed)
[facility] OPERATIVE NOTE Procedure: Left above knee amputation  Surgeon(s): Elam Dutch, MD  ASSISTANT: Nurse  Anesthesia: General  Specimens: Left leg  PROCEDURE DETAIL: After obtaining informed consent, the patient was taken to the operating room. The patient was placed in supine position the operating room table. After induction of general anesthesia and endotracheal intubation the patient's entire left lower extremity was prepped and draped in usual sterile fashion. A circumferential incision was made on the left leg just above the knee. Soft tissues were taken down as well as the muscle and fascia with cautery. The superficial femoral artery and vein were dissected free circumferentially clamped and divided. These were ligated proximally. Remainder of the soft tissues were taken down with cautery. The periosteum was raised on the femur approximately 5 cm above the skin edge. The femur was divided at this level. The leg was passed off the table as a specimen. Hemostasis was obtained. The wound was thoroughly irrigated with normal saline solution. The fascial edges were reapproximated using interrupted 2 0 Vicryl sutures. The subcutaneous tissues reapproximated using a running 3-0 Vicryl suture. The skin was closed staples. Patient tolerated procedure well and there were no complications. Instrument sponge and needle counts correct in the case. Patient was taken to recovery in stable condition.  Ruta Hinds, MD [facility] [place] Office: [phone] Pager: [phone]

## 2018-07-07 NOTE — Progress Notes (Signed)
Attempted to contact pt's family X 2. Phone just rings no vm. Will attempt again later.  Jerald Kief, RN

## 2018-07-07 NOTE — Progress Notes (Signed)
[facility] ROUNDING NOTE   Subjective:   This is a 58 year old gentleman end-stage renal disease Monday Wednesday Friday dialysis.  He was admitted 06/06/2018 for repair of bleeding left femoropopliteal bypass site.  He had undergone evaluation and graft removal with vein patch 06/15/2018.  He has noncompliance with his dialysis and fluid restriction.  He received an extra dialysis treatment Saturday for 06/2018.  He underwent successful dialysis 07/06/2018 with removal of 2 L     He underwent left groin exploration oversewing of left profunda with placement of wound VAC 07/20/2018 as an emergency procedure following bleeding from left groin.    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  Appreciate assistance from Dr. Oneida Alar  Blood pressure 98/48 pulse 75 temperature 99.3 O2 sats 100% 6 L nasal cannula  Sodium 132 potassium 4.7 glucose 135 hemoglobin 8.7   Cultures MRSA 06/14/2018 and Klebsiella oxytocin 06/17/2018   wound cultures.  Aspirin 81 mg daily, Ancef 2 g Monday Wednesday Friday, vancomycin 1 g Monday Wednesday Friday, amiodarone 200 mg twice daily, atorvastatin 40 mg twice daily, metoprolol 12.5 mg twice daily, Midodrin 10 mg twice daily, Prozac 20 mg daily, calcitriol 0.5 mcg Monday Wednesday Friday, insulin sliding scale, darbepoetin 100 mcg every 2 weeks, Neurontin 100 mg 3 times daily, Xopenex nebulizer 0.63 mg twice daily.  Nutritional supplementation Protostat sugar-free 30 cc twice daily, Rena-Vite 1 daily  Objective:  Vital signs in last 24 hours:  Temp:  [98.7 F (37.1 C)-99.8 F (37.7 C)] 99.3 F (37.4 C) (04/11 0443) Pulse Rate:  [75-96] 75 (04/11 0443) Resp:  [18-32] 20 (04/11 0443) BP: (86-136)/(25-79) 98/48 (04/11 0443) SpO2:  [90 %-100 %] 100 % (04/11 0443) Weight:  [77.1 kg-79.1 kg] 78.4 kg (04/10 1952)  Weight change: 3 kg Filed Weights   07/06/18 1418 07/06/18 1857 07/06/18 1952  Weight: 79.1 kg 77.1 kg 78.4 kg     Intake/Output: I/O last 3 completed shifts: In: 94 [P.O.:457] Out: 2025 [Drains:25; Other:2000]   Intake/Output this shift:  Total I/O In: 500 [I.V.:500] Out: -  Chronically ill-appearing gentleman CVS-  regular rate and rhythm with no JVP distention RS- CTA no wheezes or rales ABD- BS present soft non-distended EXT- no edema left groin wound with wound VAC.  Right AV fistula with thrill and bruit  Dialysis Monday Wednesday Friday 4 hours.  350/1.5   EDW 78 kg   2/2.25 bath right AV fistula no heparin  Venofer 100 mg weekly  no ESA Calcitrol 0.5 mcg Monday Wednesday Friday   Basic Metabolic Panel: Recent Labs  Lab 07/14/2018 1644  07/02/18 0830 07/04/18 0707 07/20/2018 0345 07/06/18 0427 07/21/2018 0710  NA 134*   < > 129* 130* 133* 133* 132*  K 3.4*   < > 4.3 4.3 4.7 4.7 4.7  CL 100  --  96* 94*  --  97*  --   CO2 29  --  23 25  --  24  --   GLUCOSE 120*   < > 117* 119* 140* 100* 135*  BUN 16  --  28* 40*  --  45*  --   CREATININE 2.72*  --  4.64* 5.29*  --  5.39*  --   CALCIUM 7.6*  --  8.4* 8.8*  --  8.7*  --   PHOS  --   --  3.2 3.1  --   --   --    < > = values in this interval not displayed.    Liver Function Tests:  Recent Labs  Lab 07/02/18 0830 07/04/18 0707  ALBUMIN 2.0* 1.9*   No results for input(s): LIPASE, AMYLASE in the last 168 hours. No results for input(s): AMMONIA in the last 168 hours.  CBC: Recent Labs  Lab 07/17/2018 1644  07/02/18 0829 07/02/18 1345 07/04/18 0707 06/27/2018 0345 07/06/18 0427 07/03/2018 0231 06/28/2018 0710  WBC 11.5*   < > 11.6* 13.8* 8.5  --  11.4* 11.1*  --   NEUTROABS 9.2*  --   --   --   --   --   --   --   --   HGB 6.8*   < > 7.3* 8.3* 7.4* 8.8* 9.5* 8.7* 9.5*  HCT 21.3*   < > 23.8* 26.6* 23.5* 26.0* 29.3* 27.7* 28.0*  MCV 88.0   < > 91.2 90.5 93.3  --  90.4 88.8  --   PLT 286   < > 248 264 236  --  235 208  --    < > = values in this interval not displayed.    Cardiac Enzymes: No results for input(s):  CKTOTAL, CKMB, CKMBINDEX, TROPONINI in the last 168 hours.  BNP: Invalid input(s): POCBNP  CBG: Recent Labs  Lab 07/23/2018 2131 07/06/18 0604 07/06/18 1202 07/06/18 2100 06/29/2018 0611  GLUCAP 137* 95 122* 106* 142*    Microbiology: Results for orders placed or performed during the hospital encounter of 06/06/2018  Aerobic Culture (superficial specimen)     Status: None   Collection Time: 05/28/2018  6:51 PM  Result Value Ref Range Status   Specimen Description WOUND LEFT GROIN  Final   Special Requests NONE  Final   Gram Stain   Final    ABUNDANT WBC PRESENT,BOTH PMN AND MONONUCLEAR NO ORGANISMS SEEN Performed at [facility], [place] [address] [place], [place] [address]    Culture FEW KLEBSIELLA OXYTOCA  Final   Report Status 06/27/2018 FINAL  Final   Organism ID, Bacteria KLEBSIELLA OXYTOCA  Final      Susceptibility   Klebsiella oxytoca - MIC*    AMPICILLIN >=32 RESISTANT Resistant     CEFAZOLIN <=4 SENSITIVE Sensitive     CEFEPIME <=1 SENSITIVE Sensitive     CEFTAZIDIME <=1 SENSITIVE Sensitive     CEFTRIAXONE <=1 SENSITIVE Sensitive     CIPROFLOXACIN <=0.25 SENSITIVE Sensitive     GENTAMICIN <=1 SENSITIVE Sensitive     IMIPENEM <=0.25 SENSITIVE Sensitive     TRIMETH/SULFA <=20 SENSITIVE Sensitive     AMPICILLIN/SULBACTAM 8 SENSITIVE Sensitive     PIP/TAZO <=4 SENSITIVE Sensitive     Extended ESBL NEGATIVE Sensitive     * FEW KLEBSIELLA OXYTOCA    Coagulation Studies: No results for input(s): LABPROT, INR in the last 72 hours.  Urinalysis: No results for input(s): COLORURINE, LABSPEC, PHURINE, GLUCOSEU, HGBUR, BILIRUBINUR, KETONESUR, PROTEINUR, UROBILINOGEN, NITRITE, LEUKOCYTESUR in the last 72 hours.  Invalid input(s): APPERANCEUR    Imaging: Dg Chest Port 1 View  Result Date: 07/06/2018 CLINICAL DATA:  Respiratory distress EXAM: PORTABLE CHEST 1 VIEW COMPARISON:  05/23/2018 FINDINGS: Cardiac enlargement. Congestive heart failure with bilateral edema  right greater than left. Small right effusion. No left effusion Postop CABG. AICD unchanged in position. Right jugular catheter removed since the prior study. IMPRESSION: Significant progression of congestive heart failure and pulmonary edema. Small right pleural effusion. Electronically Signed   By: Franchot Gallo M.D.   On: 07/06/2018 12:23     Medications:   . [MAR Hold]  ceFAZolin (ANCEF) IV Stopped (  07/02/18 1206)  . [MAR Hold] magnesium sulfate 1 - 4 g bolus IVPB    . [MAR Hold] vancomycin 1,000 mg (06/29/18 0923)   . [MAR Hold] acidophilus  1 capsule Oral Daily  . [MAR Hold] amiodarone  200 mg Oral BID  . [MAR Hold] aspirin EC  81 mg Oral Daily  . [MAR Hold] atorvastatin  40 mg Oral BID  . [MAR Hold] calcitRIOL  0.5 mcg Oral Q M,W,F-HD  . chlorhexidine      . [MAR Hold] Chlorhexidine Gluconate Cloth  6 each Topical Q0600  . [MAR Hold] Chlorhexidine Gluconate Cloth  6 each Topical Q0600  . [MAR Hold] darbepoetin (ARANESP) injection - NON-DIALYSIS  100 mcg Subcutaneous Q Fri-1800  . [MAR Hold] docusate sodium  100 mg Oral Daily  . [MAR Hold] feeding supplement (NEPRO CARB STEADY)  237 mL Oral TID WC  . [MAR Hold] feeding supplement (PRO-STAT SUGAR FREE 64)  30 mL Oral BID  . [MAR Hold] ferric citrate  420 mg Oral TID WC  . [MAR Hold] FLUoxetine  20 mg Oral Daily  . [MAR Hold] gabapentin  100 mg Oral TID  . [MAR Hold] heparin injection (subcutaneous)  5,000 Units Subcutaneous Q8H  . [MAR Hold] insulin aspart  0-9 Units Subcutaneous TID WC  . [MAR Hold] levalbuterol  0.63 mg Inhalation BID  . [MAR Hold] metoprolol tartrate  12.5 mg Oral BID  . [MAR Hold] midodrine  10 mg Oral BID WC  . [MAR Hold] multivitamin  1 tablet Oral QHS  . [MAR Hold] pantoprazole  40 mg Oral Daily   0.9 % irrigation (POUR BTL), [MAR Hold] acetaminophen **OR** [MAR Hold] acetaminophen, [MAR Hold] bisacodyl, [MAR Hold] guaiFENesin-dextromethorphan, [MAR Hold]  HYDROmorphone (DILAUDID) injection, [MAR Hold]  labetalol, lidocaine (PF), [MAR Hold] magnesium sulfate 1 - 4 g bolus IVPB, [MAR Hold] nitroGLYCERIN, [MAR Hold] ondansetron, [MAR Hold] oxycodone, [MAR Hold] oxyCODONE-acetaminophen, [MAR Hold] phenol, [MAR Hold] polyethylene glycol  Assessment/ Plan:   Bleeding and infection left groin wound.  Status post left femoropopliteal bypass subsequent infection with graft removal 06/15/2018.  Admitted with bleeding and sepsis presented 06/24/2018.  Taken for repair of bleeding by V VS. he underwent for the repair for 07/08/2018 emergently due to recurrent bleeding.  Appreciate assistance from Dr. Donnetta Hutching.  Cultures positive for Klebsiella oxytocin and MRSA.  Continues IV Ancef and vancomycin.  Date started appears to be 06/26/2018.  I would anticipate will need at least 3 weeks of antibiotics which would be 07/16/2018.  He is undergoing left above-knee amputation 07/26/2018   End-stage renal disease Monday Wednesday Friday dialysis AV fistula working well.  Volume overload with noncompliance with fluid restriction extra dialysis treatment 07/09/2018, successful dialysis 07/06/2018 with 2 L of fluid removed  Acute blood .  Initiated darbepoetin 100 mcg every Friday subcu.  (Does not appear that this dose was given) and received IV iron.  Total transfusion 6 units total transfused since 06/08/2018  Nutrition stable with vitamin supplementation  Bones continue calcitriol 0.5 mcg Monday Wednesday Friday.  Well-controlled calcium phosphorus balance  COPD continues on home oxygen  Chronic systolic heart failure with an ejection fraction 25 to 30% continues on Midrin 10 mg twice daily for blood pressure support  Chronic atrial fibrillation amiodarone and was on Eliquis this is been discontinued no anticoagulation at this present time.  Neuropathy Neurontin 100 mg 3 times daily dosed appropriately for renal failure  Diabetes mellitus as per primary team   LOS: [place] @  TODAY@9 :13 AM

## 2018-07-07 NOTE — Significant Event (Signed)
Rapid Response Event Note  Overview: Time Called: 2120 Arrival Time: 2120 Event Type: Respiratory  Initial Focused Assessment: While rounding on Jose Sellers, he stated he was SOB, denies CP.  BBS coarse rhonchi with rales in the bases. He does not appear to be in any respiratory distress. RR 20-24 with sats 95% on 3LNC. NSR 90s, BP He is alert and able to answer some simple questions.  His skin is pink warm and dry. He pulls about 1L on IS.  Interventions: -Stat PCXR  Plan of Care (if not transferred): -Notify primary svc -IS q1 while awake -Keep HOB greater than 30 degrees -Aspiration precautions -Call primary svc and/or RRRN for further assistance  Event Summary: Stayed in the room and stabilized  Call ended at 2152  Madelynn Done

## 2018-07-07 NOTE — Progress Notes (Signed)
Attempted to contact family. Still just ringing. Jerald Kief, RN

## 2018-07-07 NOTE — Progress Notes (Signed)
Pt more alert now, able to answer questions and take his po medications. Pt is off the non re-breather mask and his O2 sat is 94 on 4 liters. Will continue to monitor.

## 2018-07-07 NOTE — Interval H&P Note (Signed)
History and Physical Interval Note:  06/28/2018 7:30 AM  Jose Sellers  has presented today for surgery, with the diagnosis of Ischemic left leg.  The various methods of treatment have been discussed with the patient and family. After consideration of risks, benefits and other options for treatment, the patient has consented to  Procedure(s): LEFT ABOVE KNEE AMPUTATION (Left) as a surgical intervention.  The patient's history has been reviewed, patient examined, no change in status, stable for surgery.  I have reviewed the patient's chart and labs.  Questions were answered to the patient's satisfaction.     Ruta Hinds

## 2018-07-08 LAB — GLUCOSE, CAPILLARY
Glucose-Capillary: 114 mg/dL — ABNORMAL HIGH (ref 70–99)
Glucose-Capillary: 112 mg/dL — ABNORMAL HIGH (ref 70–99)
Glucose-Capillary: 108 mg/dL — ABNORMAL HIGH (ref 70–99)
Glucose-Capillary: 97 mg/dL (ref 70–99)

## 2018-07-08 MED ORDER — CHLORHEXIDINE GLUCONATE CLOTH 2 % EX PADS
6.0000 | MEDICATED_PAD | Freq: Every day | CUTANEOUS | Status: DC
  Administered 2018-07-09 – 2018-07-10 (×2): 6 via TOPICAL

## 2018-07-08 MED ORDER — NEPRO/CARBSTEADY PO LIQD: 237.0000 mL | Freq: Three times a day (TID) | ORAL | Status: DC

## 2018-07-08 NOTE — Progress Notes (Signed)
Pt made NPO until speech therapy can evaluate swallowing. Pt may be at risk for aspiration. Will monitor closely.  Jerald Kief, RN

## 2018-07-08 NOTE — Evaluation (Signed)
Clinical/Bedside Swallow Evaluation Patient Details  Name: Jose Sellers MRN: [id number] Date of Birth: [birth year]  Today's Date: 07/08/2018 Time: SLP Start Time (ACUTE ONLY): 1407 SLP Stop Time (ACUTE ONLY): 1429 SLP Time Calculation (min) (ACUTE ONLY): 22 min  Past Medical History:  Past Medical History:  Diagnosis Date  . A-fib ([place])   . AICD (automatic cardioverter/defibrillator) present   . Anal fissure   . Anxiety   . Asthma   . CAD (coronary artery disease)   . CHF (congestive heart failure) ([place])   . COPD (chronic obstructive pulmonary disease) ([place])   . Degeneration of lumbar or lumbosacral intervertebral disc   . DM (diabetes mellitus), type 2, uncontrolled w/neurologic complication ([place])   . Dyspnea    with exertion  . Dysthymic disorder   . End stage renal disease on dialysis [facility])    dialysis M-W-F   [address] in [place]  . Essential (primary) hypertension   . GERD (gastroesophageal reflux disease)   . History of hiatal hernia   . Hx of colonic polyps   . Hyperlipemia   . Myocardial infarction ([place])   . Peripheral vascular disease ([place])   . Pneumonia   . Recurrent major depression ([place])   . Sleep apnea   . Ventral hernia without obstruction or gangrene   . Wears dentures    Past Surgical History:  Past Surgical History:  Procedure Laterality Date  . ABDOMINAL AORTOGRAM W/LOWER EXTREMITY N/A 04/20/2018   Procedure: ABDOMINAL AORTOGRAM W/LOWER EXTREMITY;  Surgeon: Elam Dutch, MD;  Location: [place];  Service: Cardiovascular;  Laterality: N/A;  . AV FISTULA PLACEMENT Right 02/13/2018   Procedure: CREATION OF RADIOCEPHALIC ARTERIOVENOUS FISTULA RIGHT ARM;  Surgeon: Elam Dutch, MD;  Location: [place];  Service: Vascular;  Laterality: Right;  . CAD s/p cabbage    . CARDIAC DEFIBRILLATOR PLACEMENT  2012  . CATARACT EXTRACTION W/ INTRAOCULAR LENS IMPLANT    . COLONOSCOPY  12/27/2013   Colonic polyps status post polypectomy.  Mild  sigmoid diverticulosis. Internal hemorrhoids. Tubular adenoma.   . CORONARY ANGIOPLASTY    . CORONARY STENT PLACEMENT    . ENDARTERECTOMY Left 05/04/2018   Procedure: LEFT FEMORAL ENDARTERECTOMY;  Surgeon: [name], MD;  Location: [facility];  Service: Vascular;  Laterality: Left;  . ESOPHAGOGASTRODUODENOSCOPY  12/01/2016   Mild gastritis.   . FEMORAL ARTERY EXPLORATION Left 06/18/2018   Procedure: LEFT GROIN EXPLORATION;  Surgeon: Elam Dutch, MD;  Location: [place];  Service: Vascular;  Laterality: Left;  . FEMORAL ARTERY EXPLORATION Left 07/17/2018   Procedure: LEFT GROIN EXPLORATION; LEFT FEMORAL ARTERY EXPLORATION; LIGATION OF LEFT EXTERNAL ILIAC ARTERY; LIGATION OF LEFT FEMORAL ARTERY; APPLICATION OF WOUND VAC LEFT GROIN;  Surgeon: [name], MD;  Location: [place];  Service: Vascular;  Laterality: Left;  . FEMORAL-POPLITEAL BYPASS GRAFT Left 05/04/2018   Procedure: LEFT FEMORAL TO ABOVE KNEE POPLITEAL BYPASS GRAFT USING 6MM X 80CM GORE PROPATEN RINGED GRAFT;  Surgeon: [name], MD;  Location: [place];  Service: Vascular;  Laterality: Left;  . FEMORAL-POPLITEAL BYPASS GRAFT Left 06/14/2018   Procedure: Removal of Infected Left Femoral popliteal Bypass Graft  with vein patch angioplasty of common femoral and Popiteal artery;  Surgeon: [name], MD;  Location: [facility] OR;  Service: Vascular;  Laterality: Left;  . heart bypass    . HERNIA REPAIR    . I&D EXTREMITY Left 06/14/2018   Procedure: IRRIGATION AND DEBRIDEMENT LEFT GROIN WOUND AND  LEFT POPLITEAL WOUND.;  Surgeon: [name], MD;  Location: [facility] OR;  Service: Vascular;  Laterality: Left;  . IR REMOVAL TUN CV CATH W/O FL  06/29/2018  . KNEE SURGERY    . PATCH ANGIOPLASTY Left 05/04/2018   Procedure: Left Common Femoral/Profunda Patch Angioplasty using Hemashield Platinum Finesse Patch;  Surgeon: [name], MD;  Location: [place];  Service: Vascular;  Laterality: Left;  . PATCH ANGIOPLASTY Left 06/18/2018   Procedure: PATCH ANGIOPLAST  OF LEFT FEMORAL ARTERY;  Surgeon: Elam Dutch, MD;  Location: [place];  Service: Vascular;  Laterality: Left;  . WOUND EXPLORATION Left 06/27/2018   Procedure: REPAIR OF LEFT COMMON FEMORAL ARTERY; LEFT SARTORIOUS ROTATIONAL MUSCLE FLAP; APPLICATION OF NEGATIVE PRESSURE WOUND VAC LEFT GROIN;  Surgeon: Marty Heck, MD;  Location: [facility] OR;  Service: Vascular;  Laterality: Left;   HPI:  58 y.o. male with end-stage renal disease MWF dialysis, admitted 06/14/2018 for repair of bleeding left  femoropopliteal bypass site. He underwent left groin exploration oversewing of left profunda with placement of wound VAC 57/09/2018 as an emergency procedure following bleeding from left groin. He underwent left above-knee amputation 57/19/2020. SOB, rhonchi reported 07/06/2018, CXR showed slightly increased right basilar opacity concerning for worsening edema or pneumonia. PMHx also noted for GERD, hiatal hernia.   Assessment / Plan / Recommendation Clinical Impression   Patient presents with signs concerning for oropharyngeal dysphagia. SLP suspects mentation/cognition a significant contributor, however pt is deconditioned and was also intubated (procedure only). He follows commands inconsistently, vocal quality is extremely low intensity and pt requires consistent cues to respond to questions. Unable to self-feed; when clinician assisted with presentation of boluses, pt with prolonged oral holding. When sipping from straw, pt with immediate wet cough, suggestive of aspiration. Advise he remain NPO for now; SLP to follow up next date for PO readiness vs need for MBS for objective evaluation of swallow function.     SLP Visit Diagnosis: Dysphagia, unspecified (R13.10)    Aspiration Risk  Moderate aspiration risk    Diet Recommendation NPO   Medication Administration: Via alternative means    Other  Recommendations Oral Care Recommendations: Oral care QID   Follow up Recommendations Other (comment)(tbd)       Frequency and Duration min 2x/week  2 weeks       Prognosis Prognosis for Safe Diet Advancement: Fair Barriers to Reach Goals: Other (Comment)(medical prognosis)      Swallow Study   General Date of Onset: 06/13/2018 HPI: 58 y.o. male with end-stage renal disease MWF dialysis, admitted 06/05/2018 for repair of bleeding left  femoropopliteal bypass site. He underwent left groin exploration oversewing of left profunda with placement of wound VAC 07/08/2018 as an emergency procedure following bleeding from left groin. He underwent left above-knee amputation 07/23/2018. SOB, rhonchi reported 06/28/2018, CXR showed slightly increased right basilar opacity concerning for worsening edema or pneumonia. PMHx also noted for GERD, hiatal hernia. Type of Study: Bedside Swallow Evaluation Previous Swallow Assessment: none in chart Diet Prior to this Study: NPO Temperature Spikes Noted: No Respiratory Status: Nasal cannula History of Recent Intubation: Yes Length of Intubations (days): (for procedures, most recently 07/22/2018) Date extubated: 07/21/2018 Behavior/Cognition: Alert;Requires cueing;Distractible Oral Cavity Assessment: Dry;Dried secretions Oral Care Completed by SLP: Yes Oral Cavity - Dentition: Missing dentition;Poor condition Vision: Impaired for self-feeding Self-Feeding Abilities: Total assist Patient Positioning: Upright in bed Baseline Vocal Quality: Low vocal intensity Volitional Cough: Cognitively unable to elicit Volitional Swallow: Unable to elicit  Oral/Motor/Sensory Function Overall Oral Motor/Sensory Function: Within functional limits   Ice Chips Ice chips: Impaired Presentation: Spoon Oral Phase Impairments: Poor awareness of bolus Oral Phase Functional Implications: Oral holding   Thin Liquid Thin Liquid: Impaired Presentation: Cup;Straw Oral Phase Impairments: Poor awareness of bolus;Reduced labial seal Oral Phase Functional Implications: Right anterior spillage;Prolonged  oral transit;Oral holding Pharyngeal  Phase Impairments: Cough - Immediate(wet coughing)    Nectar Thick Nectar Thick Liquid: Not tested   Honey Thick Honey Thick Liquid: Not tested   Puree Puree: Impaired Oral Phase Impairments: Poor awareness of bolus;Reduced lingual movement/coordination Oral Phase Functional Implications: Oral holding;Prolonged oral transit;Oral residue   Solid     Solid: Not tested     Deneise Lever, MS, CCC-SLP Speech-Language Pathologist Acute Rehabilitation Services Pager: [phone] Office: [phone]  Aliene Altes 07/08/2018,3:35 PM

## 2018-07-08 NOTE — Progress Notes (Addendum)
  Progress Note    07/08/2018 7:56 AM 1 Day Post-Op  Subjective:  No complaints this morning   Vitals:   07/08/18 0542 07/08/18 0755  BP:  131/76  Pulse:  94  Resp:  16  Temp: 99.6 F (37.6 C) 98 F (36.7 C)  SpO2:  98%   Physical Exam: Lungs:  Non labored Incisions:  L groin incision with vac in place Extremities:  L AKA incision without active drainage, skin edges viable Neurologic: somnolent  CBC    Component Value Date/Time   WBC 11.1 (H) 07/03/2018 0231   RBC 3.12 (L) 07/24/2018 0231   HGB 9.5 (L) 07/13/2018 0710   HCT 28.0 (L) 07/17/2018 0710   PLT 208 07/18/2018 0231   MCV 88.8 07/21/2018 0231   MCH 27.9 06/28/2018 0231   MCHC 31.4 07/22/2018 0231   RDW 17.2 (H) 07/20/2018 0231   LYMPHSABS 1.2 07/21/2018 1644   MONOABS 0.9 07/03/2018 1644   EOSABS 0.1 07/24/2018 1644   BASOSABS 0.0 07/02/2018 1644    BMET    Component Value Date/Time   NA 132 (L) 07/20/2018 0710   K 4.7 07/01/2018 0710   CL 97 (L) 07/06/2018 0427   CO2 24 07/06/2018 0427   GLUCOSE 135 (H) 06/29/2018 0710   BUN 45 (H) 07/06/2018 0427   CREATININE 5.39 (H) 07/06/2018 0427   CALCIUM 8.7 (L) 07/06/2018 0427   GFRNONAA 11 (L) 07/06/2018 0427   GFRAA 13 (L) 07/06/2018 0427    INR    Component Value Date/Time   INR 1.5 (H) 06/23/2018 1939     Intake/Output Summary (Last 24 hours) at 07/08/2018 0756 Last data filed at 07/09/2018 0847 Gross per 24 hour  Intake 500 ml  Output -  Net 500 ml     Assessment/Plan:  58 y.o. 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  As we fall farther  behind wounds are not going to heal and he is high risk to die from his wound complications   Ruta Hinds, MD [facility] [place] Office: [phone] Pager: [phone]

## 2018-07-08 NOTE — Anesthesia Postprocedure Evaluation (Signed)
Anesthesia Post Note  Patient: Jose Sellers  Procedure(s) Performed: LEFT ABOVE KNEE AMPUTATION (Left )     Patient location during evaluation: PACU Anesthesia Type: General Level of consciousness: sedated and patient cooperative Pain management: pain level controlled Vital Signs Assessment: post-procedure vital signs reviewed and stable Respiratory status: spontaneous breathing Cardiovascular status: stable Anesthetic complications: no    Last Vitals:  Vitals:   07/08/18 1136 07/08/18 1600  BP: (!) 117/91 (!) 103/92  Pulse: 94 91  Resp: 20 19  Temp: 36.8 C 37.5 C  SpO2: 97% 96%    Last Pain:  Vitals:   07/08/18 1600  TempSrc: Oral  PainSc: 0-No pain                 Nolon Nations

## 2018-07-08 NOTE — Progress Notes (Signed)
Spoke with vascular MD on call. Pt will not leave dressing on. MD advises ok to leave off. Incision is clean and dry and intact. Jerald Kief, RN

## 2018-07-08 NOTE — Progress Notes (Signed)
[facility] ROUNDING NOTE   Subjective:   This is a 58 year old gentleman end-stage renal disease Monday Wednesday Friday dialysis.  He was admitted 06/19/2018 for repair of bleeding left femoropopliteal bypass site.  He had undergone evaluation and graft removal with vein patch 06/15/2018.  He has noncompliance with his dialysis and fluid restriction.  He received an extra dialysis treatment Saturday for 06/2018.  He underwent successful dialysis 07/06/2018 with removal of 2 L     He underwent left groin exploration oversewing of left profunda with placement of wound VAC 07/11/2018 as an emergency procedure following bleeding from left groin.    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  Appreciate assistance from Dr. Oneida Alar  Blood pressure 127/74 pulse 94 temperature 98 O2 sats 98% 3 L nasal cannula  No labs this morning  Cultures MRSA 06/14/2018 and Klebsiella oxytocin 06/02/2018   wound cultures.  Aspirin 81 mg daily, Ancef 2 g Monday Wednesday Friday, vancomycin 1 g Monday Wednesday Friday, amiodarone 200 mg twice daily, atorvastatin 40 mg twice daily, metoprolol 12.5 mg twice daily, Midodrin 10 mg twice daily, Prozac 20 mg daily, calcitriol 0.5 mcg Monday Wednesday Friday, insulin sliding scale, darbepoetin 100 mcg every 2 weeks, Neurontin 100 mg 3 times daily, Xopenex nebulizer 0.63 mg twice daily.  Nutritional supplementation Protostat sugar-free 30 cc twice daily, Rena-Vite 1 daily  Objective:  Vital signs in last 24 hours:  Temp:  [98 F (36.7 C)-99.8 F (37.7 C)] 98 F (36.7 C) (04/12 0755) Pulse Rate:  [43-98] 93 (04/12 0825) Resp:  [16-28] 20 (04/12 0825) BP: (109-131)/(53-108) 127/74 (04/12 0825) SpO2:  [91 %-98 %] 98 % (04/12 0825)  Weight change:  Filed Weights   07/06/18 1418 07/06/18 1857 07/06/18 1952  Weight: 79.1 kg 77.1 kg 78.4 kg    Intake/Output: I/O last 3 completed shifts: In: 620 [P.O.:120; I.V.:500] Out: -    Intake/Output this  shift:  No intake/output data recorded. Chronically ill-appearing gentleman CVS-  regular rate and rhythm with no JVP distention RS- CTA no wheezes or rales ABD- BS present soft non-distended EXT- no edema left groin wound with wound VAC.  Right AV fistula with thrill and bruit  Dialysis Monday Wednesday Friday 4 hours.  350/1.5   EDW 78 kg   2/2.25 bath right AV fistula no heparin  Venofer 100 mg weekly  no ESA Calcitrol 0.5 mcg Monday Wednesday Friday   Basic Metabolic Panel: Recent Labs  Lab 07/02/18 0830 07/04/18 0707 07/11/2018 0345 07/06/18 0427 07/16/2018 0710  NA 129* 130* 133* 133* 132*  K 4.3 4.3 4.7 4.7 4.7  CL 96* 94*  --  97*  --   CO2 23 25  --  24  --   GLUCOSE 117* 119* 140* 100* 135*  BUN 28* 40*  --  45*  --   CREATININE 4.64* 5.29*  --  5.39*  --   CALCIUM 8.4* 8.8*  --  8.7*  --   PHOS 3.2 3.1  --   --   --     Liver Function Tests: Recent Labs  Lab 07/02/18 0830 07/04/18 0707  ALBUMIN 2.0* 1.9*   No results for input(s): LIPASE, AMYLASE in the last 168 hours. No results for input(s): AMMONIA in the last 168 hours.  CBC: Recent Labs  Lab 07/02/18 0829 07/02/18 1345 07/04/18 0707 07/19/2018 0345 07/06/18 0427 07/26/2018 0231 07/11/2018 0710  WBC 11.6* 13.8* 8.5  --  11.4* 11.1*  --   HGB 7.3* 8.3* 7.4* 8.8*  9.5* 8.7* 9.5*  HCT 23.8* 26.6* 23.5* 26.0* 29.3* 27.7* 28.0*  MCV 91.2 90.5 93.3  --  90.4 88.8  --   PLT 248 264 236  --  235 208  --     Cardiac Enzymes: No results for input(s): CKTOTAL, CKMB, CKMBINDEX, TROPONINI in the last 168 hours.  BNP: Invalid input(s): POCBNP  CBG: Recent Labs  Lab 07/06/2018 0939 07/16/2018 1116 07/14/2018 1538 07/14/2018 2116 07/08/18 0658  GLUCAP 134* 152* 109* 110* 114*    Microbiology: Results for orders placed or performed during the hospital encounter of 06/21/2018  Aerobic Culture (superficial specimen)     Status: None   Collection Time: 05/30/2018  6:51 PM  Result Value Ref Range Status    Specimen Description WOUND LEFT GROIN  Final   Special Requests NONE  Final   Gram Stain   Final    ABUNDANT WBC PRESENT,BOTH PMN AND MONONUCLEAR NO ORGANISMS SEEN Performed at [facility], [place] [address], [place], [place] [address]    Culture FEW KLEBSIELLA OXYTOCA  Final   Report Status 06/27/2018 FINAL  Final   Organism ID, Bacteria KLEBSIELLA OXYTOCA  Final      Susceptibility   Klebsiella oxytoca - MIC*    AMPICILLIN >=32 RESISTANT Resistant     CEFAZOLIN <=4 SENSITIVE Sensitive     CEFEPIME <=1 SENSITIVE Sensitive     CEFTAZIDIME <=1 SENSITIVE Sensitive     CEFTRIAXONE <=1 SENSITIVE Sensitive     CIPROFLOXACIN <=0.25 SENSITIVE Sensitive     GENTAMICIN <=1 SENSITIVE Sensitive     IMIPENEM <=0.25 SENSITIVE Sensitive     TRIMETH/SULFA <=20 SENSITIVE Sensitive     AMPICILLIN/SULBACTAM 8 SENSITIVE Sensitive     PIP/TAZO <=4 SENSITIVE Sensitive     Extended ESBL NEGATIVE Sensitive     * FEW KLEBSIELLA OXYTOCA    Coagulation Studies: No results for input(s): LABPROT, INR in the last 72 hours.  Urinalysis: No results for input(s): COLORURINE, LABSPEC, PHURINE, GLUCOSEU, HGBUR, BILIRUBINUR, KETONESUR, PROTEINUR, UROBILINOGEN, NITRITE, LEUKOCYTESUR in the last 72 hours.  Invalid input(s): APPERANCEUR    Imaging: Dg Chest Port 1 View  Result Date: 06/27/2018 CLINICAL DATA:  Acute respiratory distress. EXAM: PORTABLE CHEST 1 VIEW COMPARISON:  Radiograph of July 06, 2018. FINDINGS: Stable cardiomediastinal silhouette. Left-sided pacemaker is unchanged in position. Sternotomy wires are noted. Atherosclerosis of thoracic aorta is noted. No pneumothorax is noted. Left lung is clear. Slightly increased right basilar opacity is noted concerning for worsening edema or pneumonia and associated pleural effusion. Bony thorax is unremarkable. IMPRESSION: Slightly increased right basilar opacity is noted concerning for worsening edema or pneumonia and associated effusion. Aortic  Atherosclerosis (ICD10-I70.0). Electronically Signed   By: Marijo Conception, M.D.   On: 07/08/2018 22:16   Dg Chest Port 1 View  Result Date: 07/06/2018 CLINICAL DATA:  Respiratory distress EXAM: PORTABLE CHEST 1 VIEW COMPARISON:  05/23/2018 FINDINGS: Cardiac enlargement. Congestive heart failure with bilateral edema right greater than left. Small right effusion. No left effusion Postop CABG. AICD unchanged in position. Right jugular catheter removed since the prior study. IMPRESSION: Significant progression of congestive heart failure and pulmonary edema. Small right pleural effusion. Electronically Signed   By: Franchot Gallo M.D.   On: 07/06/2018 12:23     Medications:   .  ceFAZolin (ANCEF) IV Stopped (07/02/18 1206)  . magnesium sulfate 1 - 4 g bolus IVPB    . vancomycin 1,000 mg (06/29/18 0923)   . acidophilus  1 capsule Oral  Daily  . amiodarone  200 mg Oral BID  . aspirin EC  81 mg Oral Daily  . atorvastatin  40 mg Oral BID  . calcitRIOL  0.5 mcg Oral Q M,W,F-HD  . darbepoetin (ARANESP) injection - NON-DIALYSIS  100 mcg Subcutaneous Q Fri-1800  . docusate sodium  100 mg Oral Daily  . feeding supplement (NEPRO CARB STEADY)  237 mL Oral TID WC  . feeding supplement (PRO-STAT SUGAR FREE 64)  30 mL Oral BID  . ferric citrate  420 mg Oral TID WC  . FLUoxetine  20 mg Oral Daily  . gabapentin  100 mg Oral TID  . heparin injection (subcutaneous)  5,000 Units Subcutaneous Q8H  . insulin aspart  0-9 Units Subcutaneous TID WC  . levalbuterol  0.63 mg Inhalation BID  . metoprolol tartrate  12.5 mg Oral BID  . midodrine  10 mg Oral BID WC  . multivitamin  1 tablet Oral QHS  . pantoprazole  40 mg Oral Daily   acetaminophen **OR** acetaminophen, bisacodyl, guaiFENesin-dextromethorphan, HYDROmorphone (DILAUDID) injection, labetalol, lidocaine (PF), magnesium sulfate 1 - 4 g bolus IVPB, nitroGLYCERIN, ondansetron, oxycodone, oxyCODONE-acetaminophen, phenol, polyethylene glycol  Assessment/  Plan:   Bleeding and infection left groin wound.  Status post left femoropopliteal bypass subsequent infection with graft removal 06/15/2018.  Admitted with bleeding and sepsis presented 06/21/2018.  Taken for repair of bleeding by V VS. he underwent for the repair for 07/16/2018 emergently due to recurrent bleeding.  Appreciate assistance from Dr. Donnetta Hutching.  Cultures positive for Klebsiella oxytocin and MRSA.  Continues IV Ancef and vancomycin.  Date started appears to be 06/05/2018.  I would anticipate will need at least 3 weeks of antibiotics which would be 07/16/2018.  He underwent successful left above-knee amputation 07/23/2018 appreciate assistance from Dr. Oneida Alar  End-stage renal disease Monday Wednesday Friday dialysis AV fistula working well.  Volume overload with noncompliance with fluid restriction extra dialysis treatment 07/11/2018, successful dialysis 07/06/2018 with 2 L of fluid removed.  His next dialysis is being scheduled for 07/09/2018  Acute blood .  Initiated darbepoetin 100 mcg every Friday subcu.  (Does not appear that this dose was given) and received IV iron.  Total transfusion 6 units total transfused since 06/11/2018  Nutrition stable with vitamin supplementation  Bones continue calcitriol 0.5 mcg Monday Wednesday Friday.  Well-controlled calcium phosphorus balance  COPD continues on home oxygen  Chronic systolic heart failure with an ejection fraction 25 to 30% continues on Midrin 10 mg twice daily for blood pressure support  Chronic atrial fibrillation amiodarone and was on Eliquis this is been discontinued no anticoagulation at this present time.  Neuropathy Neurontin 100 mg 3 times daily dosed appropriately for renal failure  Diabetes mellitus as per primary team   LOS: [place] @TODAY @9 :52 AM

## 2018-07-09 ENCOUNTER — Inpatient Hospital Stay (HOSPITAL_COMMUNITY): Payer: Medicare Other

## 2018-07-09 ENCOUNTER — Encounter (HOSPITAL_COMMUNITY): Payer: Self-pay | Admitting: Vascular Surgery

## 2018-07-09 DIAGNOSIS — T17908A Unspecified foreign body in respiratory tract, part unspecified causing other injury, initial encounter: Secondary | ICD-10-CM

## 2018-07-09 DIAGNOSIS — L8990 Pressure ulcer of unspecified site, unspecified stage: Secondary | ICD-10-CM

## 2018-07-09 DIAGNOSIS — J9601 Acute respiratory failure with hypoxia: Secondary | ICD-10-CM

## 2018-07-09 LAB — BLOOD GAS, ARTERIAL
Acid-Base Excess: 2.3 mmol/L — ABNORMAL HIGH (ref 0.0–2.0)
Bicarbonate: 26.7 mmol/L (ref 20.0–28.0)
Drawn by: 398991
FIO2: 100
O2 Saturation: 89.2 %
Patient temperature: 99.8
pCO2 arterial: 45.7 mmHg (ref 32.0–48.0)
pH, Arterial: 7.388 (ref 7.350–7.450)
pO2, Arterial: 60.9 mmHg — ABNORMAL LOW (ref 83.0–108.0)

## 2018-07-09 LAB — CBC
HCT: 27.7 % — ABNORMAL LOW (ref 39.0–52.0)
Hemoglobin: 8.9 g/dL — ABNORMAL LOW (ref 13.0–17.0)
MCH: 28.7 pg (ref 26.0–34.0)
MCHC: 32.1 g/dL (ref 30.0–36.0)
MCV: 89.4 fL (ref 80.0–100.0)
Platelets: 225 10*3/uL (ref 150–400)
RBC: 3.1 MIL/uL — ABNORMAL LOW (ref 4.22–5.81)
RDW: 17.4 % — ABNORMAL HIGH (ref 11.5–15.5)
WBC: 9.6 10*3/uL (ref 4.0–10.5)
nRBC: 0 % (ref 0.0–0.2)

## 2018-07-09 LAB — POCT I-STAT 7, (LYTES, BLD GAS, ICA,H+H)
Bicarbonate: 28.4 mmol/L — ABNORMAL HIGH (ref 20.0–28.0)
Calcium, Ion: 1.15 mmol/L (ref 1.15–1.40)
HCT: 39 % (ref 39.0–52.0)
Hemoglobin: 13.3 g/dL (ref 13.0–17.0)
O2 Saturation: 99 %
Patient temperature: 98.3
Potassium: 5.3 mmol/L — ABNORMAL HIGH (ref 3.5–5.1)
Sodium: 135 mmol/L (ref 135–145)
TCO2: 30 mmol/L (ref 22–32)
pCO2 arterial: 63.9 mmHg — ABNORMAL HIGH (ref 32.0–48.0)
pH, Arterial: 7.255 — ABNORMAL LOW (ref 7.350–7.450)
pO2, Arterial: 185 mmHg — ABNORMAL HIGH (ref 83.0–108.0)

## 2018-07-09 LAB — GLUCOSE, CAPILLARY
Glucose-Capillary: 89 mg/dL (ref 70–99)
Glucose-Capillary: 76 mg/dL (ref 70–99)
Glucose-Capillary: 79 mg/dL (ref 70–99)
Glucose-Capillary: 99 mg/dL (ref 70–99)

## 2018-07-09 LAB — BASIC METABOLIC PANEL
Anion gap: 14 (ref 5–15)
BUN: 70 mg/dL — ABNORMAL HIGH (ref 6–20)
CO2: 24 mmol/L (ref 22–32)
Calcium: 8.3 mg/dL — ABNORMAL LOW (ref 8.9–10.3)
Chloride: 99 mmol/L (ref 98–111)
Creatinine, Ser: 7.24 mg/dL — ABNORMAL HIGH (ref 0.61–1.24)
GFR calc Af Amer: 9 mL/min — ABNORMAL LOW (ref >60)
GFR calc non Af Amer: 8 mL/min — ABNORMAL LOW (ref >60)
Glucose, Bld: 82 mg/dL (ref 70–99)
Potassium: 5.4 mmol/L — ABNORMAL HIGH (ref 3.5–5.1)
Sodium: 137 mmol/L (ref 135–145)

## 2018-07-09 LAB — LACTIC ACID, PLASMA
Lactic Acid, Venous: 1.2 mmol/L (ref 0.5–1.9)
Lactic Acid, Venous: 1.3 mmol/L (ref 0.5–1.9)

## 2018-07-09 MED ORDER — FENTANYL CITRATE (PF) 100 MCG/2ML IJ SOLN
INTRAMUSCULAR | Status: AC
  Filled 2018-07-09: qty 2

## 2018-07-09 MED ORDER — PRO-STAT SUGAR FREE PO LIQD
30.0000 mL | Freq: Two times a day (BID) | ORAL | Status: DC
  Administered 2018-07-09 – 2018-07-12 (×6): 30 mL
  Filled 2018-07-09 (×6): qty 30

## 2018-07-09 MED ORDER — FENTANYL CITRATE (PF) 100 MCG/2ML IJ SOLN
100.0000 ug | Freq: Once | INTRAMUSCULAR | Status: AC
  Administered 2018-07-09: 100 ug via INTRAVENOUS

## 2018-07-09 MED ORDER — NOREPINEPHRINE 4 MG/250ML-% IV SOLN
0.0000 ug/min | INTRAVENOUS | Status: DC
  Administered 2018-07-09 – 2018-07-10 (×2): 2 ug/min via INTRAVENOUS
  Administered 2018-07-11 (×2): 10 ug/min via INTRAVENOUS
  Administered 2018-07-12: 11:00:00 12 ug/min via INTRAVENOUS
  Administered 2018-07-12: 02:00:00 10 ug/min via INTRAVENOUS
  Filled 2018-07-09 (×6): qty 250

## 2018-07-09 MED ORDER — AMIODARONE HCL 200 MG PO TABS
200.0000 mg | ORAL_TABLET | Freq: Two times a day (BID) | ORAL | Status: DC
  Administered 2018-07-09 – 2018-07-12 (×6): 200 mg
  Filled 2018-07-09 (×6): qty 1

## 2018-07-09 MED ORDER — MIDAZOLAM HCL 2 MG/2ML IJ SOLN
1.0000 mg | INTRAMUSCULAR | Status: DC | PRN
  Administered 2018-07-10 – 2018-07-12 (×5): 1 mg via INTRAVENOUS
  Filled 2018-07-09 (×4): qty 2

## 2018-07-09 MED ORDER — POLYETHYLENE GLYCOL 3350 17 G PO PACK: 17.0000 g | PACK | Freq: Every day | ORAL | Status: DC | PRN

## 2018-07-09 MED ORDER — MIDAZOLAM HCL 2 MG/2ML IJ SOLN
INTRAMUSCULAR | Status: AC
  Filled 2018-07-09: qty 2

## 2018-07-09 MED ORDER — FENTANYL CITRATE (PF) 100 MCG/2ML IJ SOLN
50.0000 ug | INTRAMUSCULAR | Status: AC | PRN
  Administered 2018-07-09 – 2018-07-11 (×2): 50 ug via INTRAVENOUS
  Filled 2018-07-09 (×3): qty 2

## 2018-07-09 MED ORDER — GABAPENTIN 100 MG PO CAPS: 100.0000 mg | ORAL_CAPSULE | Freq: Three times a day (TID) | ORAL | Status: DC

## 2018-07-09 MED ORDER — FENTANYL CITRATE (PF) 100 MCG/2ML IJ SOLN
50.0000 ug | INTRAMUSCULAR | Status: DC | PRN
  Administered 2018-07-10 – 2018-07-12 (×6): 50 ug via INTRAVENOUS
  Filled 2018-07-09 (×7): qty 2

## 2018-07-09 MED ORDER — DEXTROSE 5 % IV SOLN: 500.0000 mg | INTRAVENOUS | Status: DC

## 2018-07-09 MED ORDER — MIDAZOLAM HCL 2 MG/2ML IJ SOLN
1.0000 mg | INTRAMUSCULAR | Status: AC | PRN
  Administered 2018-07-09 – 2018-07-12 (×3): 1 mg via INTRAVENOUS
  Filled 2018-07-09 (×7): qty 2

## 2018-07-09 MED ORDER — CHLORHEXIDINE GLUCONATE 0.12% ORAL RINSE (MEDLINE KIT)
15.0000 mL | Freq: Two times a day (BID) | OROMUCOSAL | Status: DC
  Administered 2018-07-09 – 2018-07-12 (×6): 15 mL via OROMUCOSAL

## 2018-07-09 MED ORDER — INSULIN ASPART 100 UNIT/ML ~~LOC~~ SOLN
0.0000 [IU] | SUBCUTANEOUS | Status: DC
  Administered 2018-07-10 – 2018-07-11 (×5): 1 [IU] via SUBCUTANEOUS
  Administered 2018-07-11 – 2018-07-12 (×4): 2 [IU] via SUBCUTANEOUS
  Administered 2018-07-12: 1 [IU] via SUBCUTANEOUS

## 2018-07-09 MED ORDER — NEPRO/CARBSTEADY PO LIQD
25.0000 mL/h | ORAL | Status: DC
  Filled 2018-07-09: qty 237

## 2018-07-09 MED ORDER — ATORVASTATIN CALCIUM 40 MG PO TABS
40.0000 mg | ORAL_TABLET | Freq: Two times a day (BID) | ORAL | Status: DC
  Administered 2018-07-09 – 2018-07-12 (×6): 40 mg
  Filled 2018-07-09 (×6): qty 1

## 2018-07-09 MED ORDER — ETOMIDATE 2 MG/ML IV SOLN
20.0000 mg | Freq: Once | INTRAVENOUS | Status: AC
  Administered 2018-07-09: 20 mg via INTRAVENOUS

## 2018-07-09 MED ORDER — VANCOMYCIN HCL IN DEXTROSE 1-5 GM/200ML-% IV SOLN
1000.0000 mg | INTRAVENOUS | Status: DC
  Administered 2018-07-09 – 2018-07-11 (×2): 1000 mg via INTRAVENOUS
  Filled 2018-07-09: qty 200

## 2018-07-09 MED ORDER — SODIUM CHLORIDE 0.9 % IV SOLN
250.0000 mL | INTRAVENOUS | Status: DC
  Administered 2018-07-09 – 2018-07-10 (×2): 250 mL via INTRAVENOUS

## 2018-07-09 MED ORDER — MIDAZOLAM HCL 2 MG/2ML IJ SOLN
2.0000 mg | Freq: Once | INTRAMUSCULAR | Status: AC
  Administered 2018-07-09: 2 mg via INTRAVENOUS

## 2018-07-09 MED ORDER — SODIUM CHLORIDE 0.9 % IV BOLUS
500.0000 mL | Freq: Once | INTRAVENOUS | Status: AC
  Administered 2018-07-09: 16:00:00 via INTRAVENOUS

## 2018-07-09 MED ORDER — FLUOXETINE HCL 20 MG PO CAPS
20.0000 mg | ORAL_CAPSULE | Freq: Every day | ORAL | Status: DC
  Administered 2018-07-10: 09:00:00 20 mg
  Filled 2018-07-09: qty 1

## 2018-07-09 MED ORDER — ORAL CARE MOUTH RINSE
15.0000 mL | OROMUCOSAL | Status: DC
  Administered 2018-07-09 – 2018-07-12 (×31): 15 mL via OROMUCOSAL

## 2018-07-09 MED ORDER — SODIUM CHLORIDE 0.9 % IV SOLN
1.0000 g | INTRAVENOUS | Status: DC
  Administered 2018-07-09: 14:00:00 1 g via INTRAVENOUS
  Filled 2018-07-09 (×4): qty 1

## 2018-07-09 MED ORDER — OSMOLITE 1.2 CAL PO LIQD
1000.0000 mL | ORAL | Status: AC
  Administered 2018-07-09: 15:00:00 1000 mL
  Filled 2018-07-09 (×2): qty 1000

## 2018-07-09 MED ORDER — GABAPENTIN 250 MG/5ML PO SOLN
100.0000 mg | Freq: Three times a day (TID) | ORAL | Status: DC
  Administered 2018-07-09 – 2018-07-11 (×8): 100 mg via ORAL
  Filled 2018-07-09 (×13): qty 2

## 2018-07-09 MED ORDER — PANTOPRAZOLE SODIUM 40 MG IV SOLR
40.0000 mg | INTRAVENOUS | Status: DC
  Administered 2018-07-09 – 2018-07-12 (×4): 40 mg via INTRAVENOUS
  Filled 2018-07-09 (×4): qty 40

## 2018-07-09 MED ORDER — ROCURONIUM BROMIDE 50 MG/5ML IV SOLN
50.0000 mg | Freq: Once | INTRAVENOUS | Status: AC
  Administered 2018-07-09: 50 mg via INTRAVENOUS

## 2018-07-09 MED ORDER — MIDODRINE HCL 5 MG PO TABS
10.0000 mg | ORAL_TABLET | Freq: Two times a day (BID) | ORAL | Status: DC
  Administered 2018-07-09 – 2018-07-11 (×4): 10 mg
  Filled 2018-07-09 (×5): qty 2

## 2018-07-09 NOTE — Progress Notes (Signed)
Got pt wife on phone.  Spoke with son and wife.  They still want full code at this point.  I explained to her that he may not survive to get out of the hospital or come off the ventilator but they want to press ahead for now  Ruta Hinds, MD [facility]: [phone] Pager: [phone]

## 2018-07-09 NOTE — Procedures (Signed)
Bronchoscopy Procedure Note ASAHD CAN [id number] 1960/12/28  Procedure: Bronchoscopy Indications: Diagnostic evaluation of the airways and Obtain specimens for culture and/or other diagnostic studies  Procedure Details Consent: Unable to obtain consent because of emergent medical necessity. Time Out: Verified patient identification, verified procedure, site/side was marked, verified correct patient position, special equipment/implants available, medications/allergies/relevent history reviewed, required imaging and test results available.  {time out performed:yes In preparation for procedure, patient was given 100% FiO2. Sedation: Muscle relaxants and Etomidate  Airway entered and the following bronchi were examined: RUL, RML, RLL, LUL, LLL and Bronchi.   Procedures performed: Brushings performed Bronchoscope removed.    Evaluation Hemodynamic Status: BP stable throughout; O2 sats: stable throughout Patient's Current Condition: stable Specimens:  Sent purulent fluid - green thick from lower lobes bialteraly R > L Complications: No apparent complications Patient did tolerate procedure well.    SIGNATURE    Dr. Brand Males, M.D., F.C.C.P,  Pulmonary and Critical Care Medicine Staff Physician, [place] Director - Interstitial Lung Disease  Program  [facility] [place], [place], [address]  Pager: [phone], If no answer or between  15:00h - 7:00h: call [phone] Telephone: [phone]  12:47 PM 07/09/2018

## 2018-07-09 NOTE — Progress Notes (Signed)
[facility]  Subjective  - Pulmonary congestion   Objective 127/78 (!) 26 99.4 F (37.4 C) (Axillary) (!) 22 96% No intake or output data in the 24 hours ending 07/09/18 0717  Left AKA open to air healing well Right LE active range of motion intact Lungs non labored breathing, congestion with cough Cental edema  Assessment/Planning: POD #POD # 2 Left AKA HD planned today may help edema and pulmonary status     Roxy Horseman 07/09/2018 7:17 AM --  Laboratory Lab Results: Recent Labs    07/03/2018 0231 07/26/2018 0710  WBC 11.1*  --   HGB 8.7* 9.5*  HCT 27.7* 28.0*  PLT 208  --    BMET Recent Labs    06/27/2018 0710  NA 132*  K 4.7  GLUCOSE 135*    COAG Lab Results  Component Value Date   INR 1.5 (H) 05/29/2018   INR 1.23 05/01/2018   INR 1.08 02/13/2018   No results found for: PTT

## 2018-07-09 NOTE — Progress Notes (Signed)
Pt has +3 pitting edema around the tights, and hips areas bilaterally, and BL coarse crackles breast sound. Rapid response notified.

## 2018-07-09 NOTE — Progress Notes (Signed)
Pt oxygen level dropped to the 50s, Pt switched to the no re-breather mask full flow. Pt breading pattern is very labored, with coarse crackles. RRT provided an NTS.Marland Kitchen We'll continue to monitor.

## 2018-07-09 NOTE — Progress Notes (Signed)
Patient's wife called and was updated on patient status. I let wife know that RN would call her to further update once patient was moved to the ICU and situated to discuss POC.  Emelda Fear, RN

## 2018-07-09 NOTE — Consult Note (Signed)
NAME:  Jose Sellers, MRN:  [id number], DOB:  Jan 14, 1961, LOS: 36 ADMISSION DATE:  06/06/2018, CONSULTATION DATE:  07/09/18 REFERRING MD:  Carlis Abbott  CHIEF COMPLAINT:  Hypoxia   Brief History   Jose Sellers is a 58 y.o. male with ESRD on HD MWF who initially was admitted 3/16 through 3/24 for MRSA groin wound infection.  Discharged then returned 3/30 with bleeding from left groin. Required multiple trips to OR for exploration / repair and ultimately left AKA. 4/30 had hypoxia due to probable aspiration. Required transfer to ICU for intubation.  History of present illness   Pt is encephelopathic; therefore, this HPI is obtained from chart review. Jose Sellers is a 58 y.o. male who has a PMH as outlined below including but not limited to ESRD on HD MWF,  DM, HTN, CAD, A.fib, AICD, COPD (see "past medical history").  He was admitted 3/30 due to bleeding from left groin after graft removal and vein patch of left femoral artery about 10 days prior.  Prior to this admit, he had admission 3/16 through 3/24 for MRSA groin wound infection.  Course complicated by recurrent bleeding and ultimately ischemia requiring AKA.  See details below.  3/30 to OR for patch angioplasty of left CFA.   4/4 back to OR for continued bleeding.  Had CFA repair, left sartorius rotational muscle flap, 24F drain and negative pressure wound VAC placement. 4/9 back to OR for left groin exploration and oversewing of left profunda, superficial femoral and distal external iliac artery, placement of VAC dressing. 4/11 back to OR for left above knee amputation  Overnight 4/12, he had desaturations and increased O2 requirements.  Had similar issues prior which resolved after HD.  CXR showed possible worsening consolidation.  Initial thoughts were aspiration (per vascular, he had issues with this in the past and currently so weak that has poor cough / gag capabilities.    He was placed on NRB; however, continued to have sats in  high 80s to low 90s.  Despite having open eyes, pt also minimally responsive.  Not able to answer questions or follow commands.  Very weak cough.  PCCM consulted for concern he would require intubation.  I called pt's wife and discussed the case, overall condition and poor prognosis.  Dr. Oneida Alar with vascular also re-iterated same information including chance that pt may not survive this hospitalization and that if he were intubated, there is a risk that he may never be liberated from the ventilator.  Also discussed tube feeds and possibility of needed PEG / trach down the line.  Wife and son understandably emotional; however, not realistic about pt's condition and likelihood for meaningful recovery.  They are requesting that we transfer him to the ICU and proceed with intubation if needed.  Past Medical History  ESRD on HD MWF, OSA, aspiration, PVD, MI, HLD, CAD, CHF, A.fib, AICD, DM, COPD, anxiety.  Significant Hospital Events   3/16 through 3/24 > admitted for MRSA groin wound infection.   3/30 > OR for patch angioplasty of left CFA.   4/4 > OR for continued bleeding.  Had CFA repair, left sartorius rotational muscle flap, 24F drain and negative pressure wound VAC placement. 4/9 > OR for left groin exploration and oversewing of left profunda, superficial femoral and distal external iliac artery, placement of VAC dressing. 4/11 > OR for left above knee amputation. 4/13 > transferred to ICU and intubated.  Consults:  Nephrology. PCCM.  Procedures:  ETT 4/13 >  Significant Diagnostic Tests:  CXR 4/13 > small right effusion, new opacity left base.  Micro Data:  Lft groin wound 3/19 > MRSA Left groin wound 3/13 > klebsiella (resistant to amp). BAL 4/13 >   Antimicrobials:  Vanc 3/30 >  Ancef 3/30 / 4/13 Cefepime 4/13 >    Interim history/subjective:  Just transferred to ICU and intubated.  Objective:  Blood pressure 103/62, pulse 95, temperature (!) 100.7 F (38.2 C),  temperature source Rectal, resp. rate 19, height 6' (1.829 m), weight 66.6 kg, SpO2 94 %.       No intake or output data in the 24 hours ending 07/09/18 1117 Filed Weights   07/06/18 1857 07/06/18 1952 07/09/18 0318  Weight: 77.1 kg 78.4 kg 66.6 kg    Examination: General: Chronically ill appearing male, appears much older than stated age, just intubated. Neuro: Unresponsive. HEENT: Jose Sellers/AT. Sclerae anicteric.  ETT in place. Cardiovascular: RRR, no M/R/G.  Lungs: Respirations even and unlabored.  Clear bilaterally. Abdomen: BS x 4, soft, NT/ND.  Musculoskeletal: Left AKA, no edema.  Skin: Intact, warm, no rashes.  Assessment & Plan:   Acute hypoxic respiratory failure - Likely Aspiration Pneumonia +/-  pulmonary edema/ small effusion Hx COPD, ongoing tobacco abuse P:  After discussion with family, they elected to continue with full aggressive measures/ life support.  Pt just intubated in ICU Full MV support, PRVC 8 cc/kg, rate 18 CXR stat ABG in 1 hour PAD protocol with PRN fent/ versed for RASS goal 0/-1  Hx PAD w/ Left infected femoral popliteal bypass with recurrent bleeding bleeding - S/p eval and graft removal with vein patch 3/20 - Repair of bleeding left femoropopliteal bypass site 3/30 - S/p Left AKA 4/11 for ischemic leg - previous MRSA 3/19 of left groin groin and Klebsiella oxytocin 3/30 P:  Per VSS  Leukocytosis- ongoing left groin infection Klebsiella oxytocin 3/30 and new suspected aspiration pneumonia P:  BAL sent for culture Continue vanc/cefepime Trend WBC/ fever curve   ESRD on HD Mild hyperkalemia  - last iHD 4/10 P:  Per Nephrology  Daily Renal panel/ mag May need to continue daily midodrine 57m BID  Chronic Atrial Fibrillation - previous CHA2DS2-VASc Score of 5, previous eliquis d/c by cardiology P:  Tele monitoring Continue amio D/c metoprolol  (he is on midodrine and HTN meds)   CAD, systolic HF -EF 264%( 86/8032 P:  Continue ASA  iHD as above   DM P:  CBG q 4 SSI sensitive   HLD P:  Continue lipitor  Chronic thrombocytopenia - stable Normocytic Anemia  P:  Trend CBC Transfuse for Hgb < 7  Protein calorie malnutrition P:  Consult for cortrak placement Restart TF- renal restricted per dietary   Global: As patient has had complicated hospitalization in addition to his underlying chronic diseases- systolic HF, ESRD, DM, wound infection, protein calorie malnutrition, prognosis is guarded and patient would benefit from palliative care consult to help with family and clarify goals of care.  I called pt's wife and discussed the case, overall condition and poor prognosis.  Dr. FOneida Alarwith vascular also re-iterated same information including chance that pt may not survive this hospitalization and that if he were intubated, there is a risk that he may never be liberated from the ventilator.  Also discussed tube feeds and possibility of needed PEG / trach down the line. Wife and son understandably emotional; however, not realistic about pt's condition and likelihood for meaningful recovery.    Best practice:  Diet: NPO, start TF's Pain/Anxiety/Delirium protocol (if indicated): PRNs only VAP protocol (if indicated): Yes DVT prophylaxis: heparin SQ GI prophylaxis: PPI Glucose control: CBG q 4 Mobility: BR Code Status: Full  Family Communication: Wife and son updated over the phone. Disposition: ICU   Labs   CBC: Recent Labs  Lab 07/02/18 1345 07/04/18 0707 06/28/2018 0345 07/06/18 0427 07/01/2018 0231 07/23/2018 0710 07/09/18 0901  WBC 13.8* 8.5  --  11.4* 11.1*  --  9.6  HGB 8.3* 7.4* 8.8* 9.5* 8.7* 9.5* 8.9*  HCT 26.6* 23.5* 26.0* 29.3* 27.7* 28.0* 27.7*  MCV 90.5 93.3  --  90.4 88.8  --  89.4  PLT 264 236  --  235 208  --  109   Basic Metabolic Panel: Recent Labs  Lab 07/04/18 0707 07/11/2018 0345 07/06/18 0427 07/22/2018 0710 07/09/18 0901  NA 130* 133* 133* 132* 137  K 4.3 4.7 4.7 4.7  5.4*  CL 94*  --  97*  --  99  CO2 25  --  24  --  24  GLUCOSE 119* 140* 100* 135* 82  BUN 40*  --  45*  --  70*  CREATININE 5.29*  --  5.39*  --  7.24*  CALCIUM 8.8*  --  8.7*  --  8.3*  PHOS 3.1  --   --   --   --    GFR: Estimated Creatinine Clearance: 10.7 mL/min (A) (by C-G formula based on SCr of 7.24 mg/dL (H)). Recent Labs  Lab 07/04/18 0707 07/06/18 0427 07/06/18 1156 07/06/18 1410 07/25/2018 0231 07/09/18 0901 07/09/18 0908  WBC 8.5 11.4*  --   --  11.1* 9.6  --   LATICACIDVEN  --   --  1.4 1.2  --   --  1.2   Liver Function Tests: Recent Labs  Lab 07/04/18 0707  ALBUMIN 1.9*   No results for input(s): LIPASE, AMYLASE in the last 168 hours. No results for input(s): AMMONIA in the last 168 hours. ABG    Component Value Date/Time   PHART 7.388 07/09/2018 0845   PCO2ART 45.7 07/09/2018 0845   PO2ART 60.9 (L) 07/09/2018 0845   HCO3 26.7 07/09/2018 0845   TCO2 30 07/04/2018 1645   O2SAT 89.2 07/09/2018 0845    Coagulation Profile: No results for input(s): INR, PROTIME in the last 168 hours. Cardiac Enzymes: No results for input(s): CKTOTAL, CKMB, CKMBINDEX, TROPONINI in the last 168 hours. HbA1C: Hgb A1c MFr Bld  Date/Time Value Ref Range Status  05/01/2018 11:47 AM 6.0 (H) 4.8 - 5.6 % Final    Comment:    (NOTE) Pre diabetes:          5.7%-6.4% Diabetes:              >6.4% Glycemic control for   <7.0% adults with diabetes    CBG: Recent Labs  Lab 07/08/18 0658 07/08/18 1134 07/08/18 1605 07/08/18 2114 07/09/18 0708  GLUCAP 114* 112* 108* 97 89    Review of Systems:   Unable to obtain as pt is encephalopathic.  Past medical history  He,  has a past medical history of A-fib ([place]), AICD (automatic cardioverter/defibrillator) present, Anal fissure, Anxiety, Asthma, CAD (coronary artery disease), CHF (congestive heart failure) ([place]), COPD (chronic obstructive pulmonary disease) ([place]), Degeneration of lumbar or lumbosacral intervertebral disc, DM  (diabetes mellitus), type 2, uncontrolled w/neurologic complication ([place]), Dyspnea, Dysthymic disorder, End stage renal disease on dialysis ([place]), Essential (primary) hypertension, GERD (gastroesophageal reflux disease), History of hiatal hernia, colonic  polyps, Hyperlipemia, Myocardial infarction [facility]), Peripheral vascular disease ([place]), Pneumonia, Recurrent major depression ([place]), Sleep apnea, Ventral hernia without obstruction or gangrene, and Wears dentures.   Surgical History    Past Surgical History:  Procedure Laterality Date  . ABDOMINAL AORTOGRAM W/LOWER EXTREMITY N/A 04/20/2018   Procedure: ABDOMINAL AORTOGRAM W/LOWER EXTREMITY;  Surgeon: Elam Dutch, MD;  Location: [place] [facility];  Service: Cardiovascular;  Laterality: N/A;  . AMPUTATION Left 06/29/2018   Procedure: LEFT ABOVE KNEE AMPUTATION;  Surgeon: Elam Dutch, MD;  Location: [facility] OR;  Service: Vascular;  Laterality: Left;  . AV FISTULA PLACEMENT Right 02/13/2018   Procedure: CREATION OF RADIOCEPHALIC ARTERIOVENOUS FISTULA RIGHT ARM;  Surgeon: Elam Dutch, MD;  Location: [place];  Service: Vascular;  Laterality: Right;  . CAD s/p cabbage    . CARDIAC DEFIBRILLATOR PLACEMENT  2012  . CATARACT EXTRACTION W/ INTRAOCULAR LENS IMPLANT    . COLONOSCOPY  12/27/2013   Colonic polyps status post polypectomy.  Mild sigmoid diverticulosis. Internal hemorrhoids. Tubular adenoma.   . CORONARY ANGIOPLASTY    . CORONARY STENT PLACEMENT    . ENDARTERECTOMY Left 05/04/2018   Procedure: LEFT FEMORAL ENDARTERECTOMY;  Surgeon: [name], MD;  Location: [facility];  Service: Vascular;  Laterality: Left;  . ESOPHAGOGASTRODUODENOSCOPY  12/01/2016   Mild gastritis.   . FEMORAL ARTERY EXPLORATION Left 06/06/2018   Procedure: LEFT GROIN EXPLORATION;  Surgeon: Elam Dutch, MD;  Location: [place];  Service: Vascular;  Laterality: Left;  . FEMORAL ARTERY EXPLORATION Left 07/02/2018   Procedure: LEFT GROIN EXPLORATION; LEFT FEMORAL ARTERY  EXPLORATION; LIGATION OF LEFT EXTERNAL ILIAC ARTERY; LIGATION OF LEFT FEMORAL ARTERY; APPLICATION OF WOUND VAC LEFT GROIN;  Surgeon: [name], MD;  Location: [place];  Service: Vascular;  Laterality: Left;  . FEMORAL-POPLITEAL BYPASS GRAFT Left 05/04/2018   Procedure: LEFT FEMORAL TO ABOVE KNEE POPLITEAL BYPASS GRAFT USING 6MM X 80CM GORE PROPATEN RINGED GRAFT;  Surgeon: [name], MD;  Location: [place];  Service: Vascular;  Laterality: Left;  . FEMORAL-POPLITEAL BYPASS GRAFT Left 06/14/2018   Procedure: Removal of Infected Left Femoral popliteal Bypass Graft  with vein patch angioplasty of common femoral and Popiteal artery;  Surgeon: [name], MD;  Location: [facility] OR;  Service: Vascular;  Laterality: Left;  . heart bypass    . HERNIA REPAIR    . I&D EXTREMITY Left 06/14/2018   Procedure: IRRIGATION AND DEBRIDEMENT LEFT GROIN WOUND AND LEFT POPLITEAL WOUND.;  Surgeon: [name], MD;  Location: [facility] OR;  Service: Vascular;  Laterality: Left;  . IR REMOVAL TUN CV CATH W/O FL  06/29/2018  . KNEE SURGERY    . PATCH ANGIOPLASTY Left 05/04/2018   Procedure: Left Common Femoral/Profunda Patch Angioplasty using Hemashield Platinum Finesse Patch;  Surgeon: [name], MD;  Location: [place];  Service: Vascular;  Laterality: Left;  . PATCH ANGIOPLASTY Left 06/05/2018   Procedure: PATCH ANGIOPLAST OF LEFT FEMORAL ARTERY;  Surgeon: Elam Dutch, MD;  Location: [place];  Service: Vascular;  Laterality: Left;  . WOUND EXPLORATION Left 07/11/2018   Procedure: REPAIR OF LEFT COMMON FEMORAL ARTERY; LEFT SARTORIOUS ROTATIONAL MUSCLE FLAP; APPLICATION OF NEGATIVE PRESSURE WOUND VAC LEFT GROIN;  Surgeon: Marty Heck, MD;  Location: [place];  Service: Vascular;  Laterality: Left;     Social History   reports that he has been smoking cigarettes. He has a 8.75 pack-year smoking history. He has never used smokeless tobacco. He reports previous  alcohol use. He reports that he does not use drugs.   Family  history   His family history is negative for Colon cancer.   Allergies Allergies  Allergen Reactions  . Albuterol Palpitations    SVT  . Prednisone Shortness Of Breath    Wife stated that patient develops breathing issues.  . Fluticasone Furoate-Vilanterol Palpitations  . Morphine Rash     Home meds  Prior to Admission medications   Medication Sig Start Date End Date Taking? Authorizing Provider  amiodarone (PACERONE) 200 MG tablet Take 200 mg by mouth 2 (two) times daily.     [provider]  aspirin 81 MG tablet Take 81 mg by mouth daily.  01/20/11   [provider]  atorvastatin (LIPITOR) 40 MG tablet Take 40 mg by mouth 2 (two) times daily.     [provider]  B Complex-C-Folic Acid (DIALYVITE 583) 0.8 MG TABS Take 1 tablet by mouth daily.  12/19/17   [provider]  calcitRIOL (ROCALTROL) 0.25 MCG capsule Take 0.25 mcg by mouth every Monday, Wednesday, and Friday with hemodialysis.     [provider]  esomeprazole (NEXIUM) 40 MG capsule Take 40 mg by mouth daily before breakfast.     [provider]  ferric citrate (AURYXIA) 1 GM 210 MG(Fe) tablet Take 420 mg by mouth 3 (three) times daily with meals.     [provider]  FLUoxetine (PROZAC) 20 MG capsule Take 20 mg by mouth daily.  11/07/17   [provider]  furosemide (LASIX) 40 MG tablet Take 40 mg by mouth See admin instructions. Non dialysis days. Do not take on Monday, Wednesday and Friday    [provider]  gabapentin (NEURONTIN) 300 MG capsule Take 1 capsule (300 mg total) by mouth 3 (three) times daily. 06/22/18   Landis Martins, DPM  Insulin Glargine (LANTUS SOLOSTAR) 100 UNIT/ML Solostar Pen Inject 8-12 Units into the skin at bedtime as needed (blood sugar).     [provider]  insulin lispro (HUMALOG KWIKPEN) 100 UNIT/ML KwikPen Inject 0-32 Units into the skin 3 (three) times daily. Sliding Scale Insulin     [provider]  levalbuterol (XOPENEX) 0.63 MG/3ML nebulizer solution Inhale 0.63 mg into the lungs 2 (two) times daily.  10/09/17   [provider]  lidocaine-prilocaine (EMLA) cream Apply 1 application topically See admin instructions. Apply small amount to access site 1-2 hours prior to dialysis. 01/29/18   [provider]  metoprolol tartrate (LOPRESSOR) 25 MG tablet Take 12.5 mg by mouth 2 (two) times daily.  03/10/18   [provider]  nitroGLYCERIN (NITROSTAT) 0.4 MG SL tablet Place 0.4 mg under the tongue every 5 (five) minutes as needed for chest pain.    [provider]  oxycodone (ROXICODONE) 30 MG immediate release tablet Take 30 mg by mouth every 8 (eight) hours as needed for pain.     [provider]  oxyCODONE-acetaminophen (PERCOCET) 7.5-325 MG tablet Take 1 tablet by mouth every 6 (six) hours as needed for severe pain. 05/06/18   Dagoberto Ligas, PA-C  SANTYL ointment Apply 1 application topically daily. 05/29/18   [name], MD  tiotropium (SPIRIVA) 18 MCG inhalation capsule Place 18 mcg into inhaler and inhale daily.  03/10/18   [provider]    Critical care time: 55 min including family updates.    Montey Hora, [facility] Pager: [phone].  If no answer, [phone] 07/09/2018, 11:17 AM

## 2018-07-09 NOTE — Progress Notes (Signed)
Pharmacy Antibiotic Note  Jose Sellers is a 58 y.o. male admitted on 06/07/2018 with klebsiella oxytoca wound infection (s/p left fem pop bypass and subsequent infection).   He also had a recent MRSA infection (infected bypass) and was on vancomycin PTA and continues on this. Pharmacy has been consulted for vancomycin dosing.  He is noted with ESRD with last HD on 4/10.He is know s.p AKA on 4/11  Plan: Continue vancomycin 1g IV QHD-MWF Continue Ancef 2gmIV MWF with HD,  Will follow patient progress and plans for LOT  Height: 6' (182.9 cm) Weight: 146 lb 13.2 oz (66.6 kg) IBW/kg (Calculated) : 77.6  Temp (24hrs), Avg:99.3 F (37.4 C), Min:98.2 F (36.8 C), Max:99.8 F (37.7 C)  Recent Labs  Lab 07/02/18 1345 07/04/18 0707 07/06/18 0427 07/06/18 1120 07/06/18 1156 07/06/18 1410 07/11/2018 0231  WBC 13.8* 8.5 11.4*  --   --   --  11.1*  CREATININE  --  5.29* 5.39*  --   --   --   --   LATICACIDVEN  --   --   --   --  1.4 1.2  --   VANCORANDOM  --   --   --  23  --   --   --     Estimated Creatinine Clearance: 14.4 mL/min (A) (by C-G formula based on SCr of 5.39 mg/dL (H)).    Allergies  Allergen Reactions  . Albuterol Palpitations    SVT  . Prednisone Shortness Of Breath    Wife stated that patient develops breathing issues.  . Fluticasone Furoate-Vilanterol Palpitations  . Morphine Rash    Hildred Laser, PharmD Clinical Pharmacist **Pharmacist phone directory can now be found on [email or web address] (PW [email or web address]).  Listed under [place].

## 2018-07-09 NOTE — Consult Note (Signed)
[place] Nurse wound consult note Reason for Consult: multiple pressure injuries; new discovered upon transfer to the ICU. Patient now intubated.  Wound type: Deep tissue pressure injuries noted:  1. Right heel 2-3. Right and left ischium 4. Large extensive area on the sacrum and right buttock 5. Discoloration of two areas on the left AKA anterior and posterior   Feel that these new areas of quick onset are SCALE injuries (skin change at end of life) and I have relayed this information to CCM, VVS, and Nephrology   Pressure Injury POA: No Measurement: see nursing flow sheet; all measurements documented today Wound bed: wounds are all closed except the sacrum which has some superficial skin peeling noted today Drainage (amount, consistency, odor) none Periwound: intact Dressing procedure/placement/frequency: Silicone foam dressings to area affected areas Progressa ICU bed for moisture management and pressure redistribution Prevalon boot in place for offloading the right heel.   [place] nurse team will follow along for support with [facility] dressing changes as we were and to monitor skin injuries for evolution   Thanks  [place] MSN, RN,CWOCN, CNS, CWON-AP [phone])

## 2018-07-09 NOTE — Progress Notes (Signed)
Assisted camera communication with family per elink

## 2018-07-09 NOTE — Progress Notes (Signed)
Report called to RN on [facility]. RR transporting patient at this time.   Emelda Fear, RN

## 2018-07-09 NOTE — Progress Notes (Addendum)
Nutrition Follow-up   RD working remotely.  DOCUMENTATION CODES:   Not applicable  INTERVENTION:   Suggest placing feeding tube and initiating:  Osmolite 1.2 at 25 mL/hr and increasing by 10 mL/hr q 12 hours until goal of 65 mL/hr is met. - This provides 1560 mL formula, 1872 kcal, 87 g protein, and 1279 mL free water. - This regimen meets 100% estimated energy and protein needs.  Continue Rena-Vit daily  NUTRITION DIAGNOSIS:   Increased nutrient needs related to wound healing as evidenced by estimated needs.  Ongoing  GOAL:   Patient will meet greater than or equal to 90% of their needs  Not meeting  MONITOR:   PO intake, Supplement acceptance, Labs, Weight trends, Skin, I & O's  REASON FOR ASSESSMENT:   Consult Enteral/tube feeding initiation and management  ASSESSMENT:   Patient with PMH significant for CAD, CHF, COPD, DM, ESRD on HD, HLD, PVD, and s/p left fem pop bypass with subsequent infection. Ten days ago had graft removal/vein patch of left fem artery and was sent home with VAC on groin. Presents this admission with left groin bleeding.   4/10: HD, 2 L removed 4/11: L AKA 4/12: SLP bedside swallow, NPO 4/13: noted BLE +3 edema; BIPAP ordered d/t poor O2 sats  Most recent labs: 4/11 sodium 132 (L), 4/10 chloride 97 (L), 4/11 glucose 135 mg/dL (H)  Meds: acidophilus, Lipitor, calcitriol, Colace, Nepro shake TID, ProStat BID, ferric citrate, Novolog, Rena-Vit, Protonix EC  RD consulted for TF recommendations, outlined above. Did not reach out to pt by phone given respiratory status. Per chart review, SLP unable to reevaluate patient today. Will d/c Nepro shake given NPO status.  Updated: pt transferred to ICU and intubated   NUTRITION - FOCUSED PHYSICAL EXAM: Deferred - RD working remotely  Diet Order:  No known food allergies 4/3-4/11 Renal diet, 1200 mL fluid 4/12 NPO Diet Order            Diet NPO time specified  Diet effective now             PO Intake 4/5-4/6: 25-100% x 3 meals recorded 4/7-4/10: 0-50% x 4 meals recorded  EDUCATION NEEDS:  Education needs have been addressed  Skin:  Skin Assessment: Skin Integrity Issues: Skin Integrity Issues:: Wound VAC, Unstageable, Diabetic Ulcer Unstageable: left heel Wound Vac: left groin Diabetic Ulcer: left toe  Last BM:  4/9, type 6  Height:  Ht Readings from Last 1 Encounters:  06/04/2018 6' (1.829 m)    Weight:  Wt Readings from Last 10 Encounters:  07/09/18 66.6 kg -  L AKA on 4/11  07/04/18 76.6 kg  06/20/18 81.7 kg  06/11/18 80.7 kg  05/29/18 83 kg  05/06/18 83.2 kg  05/01/18 82.5 kg  04/20/18 81.6 kg  04/05/18 83.6 kg  02/13/18 83.6 kg  02/08/18 83.7 kg  EDW 78 kg, will likely change d/t AKA  Net -11.6 L fluid since admit  Ideal Body Weight:  72.7 kg(adjusted for L AKA)  BMI:  Body mass index is 22.4 kg/m, adjusted for L AKA, normal   Estimated Nutritional Needs:   Kcal:  1888 (PSU 2003b)  Protein:  >80 gm (>1.2 g/kg)  Fluid:  per MD  Hannah Schlotterer, MS, RDN, LDN Pager: [phone] Available Mondays and Fridays, 9am-2pm  

## 2018-07-09 NOTE — Procedures (Signed)
Cortrak  Person Inserting Tube:  Zevin Nevares C, RD Tube Type:  Cortrak - 43 inches Tube Location:  Left nare Initial Placement:  Stomach Secured by: Bridle Technique Used to Measure Tube Placement:  Documented cm marking at nare/ corner of mouth Cortrak Secured At:  75 cm    Cortrak Tube Team Note:  Consult received to place a Cortrak feeding tube.   No x-ray is required. RN may begin using tube.   If the tube becomes dislodged please keep the tube and contact the Cortrak team at [email or web address] ([email or web address]) for replacement.  If after hours and replacement cannot be delayed, place a NG tube and confirm placement with an abdominal x-ray.    Roxie Kreeger RD, LDN, CNSC [phone] Pager [phone] After Hours Pager   

## 2018-07-09 NOTE — Consult Note (Signed)
[facility] Nurse wound follow up Patient receiving care in [place].  Patient only grimmacing once or twice during VAC dressing change to left groin. Wound type: Surgical Measurement: deferred Wound bed: pale, grey/pink with deep fissures. Red, beefy, granulation tissue absent to wound.  Wound had a distinct foul odor. Drainage (amount, consistency, odor) sanginous in cannister. Serous observed in wound bed during dressing change. Periwound: to the patient's left side of the wound bed there are three areas of slough.  I covered these with a flattened barrier ring.  I am not at all sure of the origin of these areas. Dressing procedure/placement/frequency: MWF VAC dressing. One piece of black foam removed, one piece placed. Dressing dated. Immediate seal obtained at 125 mm Hg. Val Riles, RN, MSN, CWOCN, CNS-BC, pager [phone]

## 2018-07-09 NOTE — Progress Notes (Signed)
Upon arrival to patient room patient currently with rapid response RN due to lethargy and sats of 93% on non-rebreather mask.  ABG was obtained and resulted.  Bipap was ordered PRN however patient congested with a weak cough and not likely to protect airway with bipap placed.  Chest xray obtained.  Will wait for further instructions.  Will continue to monitor.

## 2018-07-09 NOTE — Progress Notes (Signed)
Called by rapid response this morning during my case stating that patient was now on nonrebreather with sats in the 87-88%.  Apparently some increased respiratory distress overnight.  Patient is due for dialysis today but does not appear terribly volume overloaded on exam.  He does have worsening consolidation on his chest x-ray with very poor pulmonary toilet and concern for underlying risk of aspiration.  We will broaden his antibiotics to Vanc and cefepime.  He has been on Vanc Ancef for wound infection in his groin.  We will plan to move him to the ICU given likely increased need for respiratory care.  Will likely involve ICU team.  Gas shows FiO2 only 60.  Labs pending.  Marty Heck, MD [facility] Office: [phone] Pager: [phone]

## 2018-07-09 NOTE — Progress Notes (Signed)
Assisted tele visit to patient with family member.  Jose Sellers R, RN  

## 2018-07-09 NOTE — Progress Notes (Signed)
Davis, from [place]  Arrived to patient room, assessed and suctioned the PT. Pt is still on non re-breather mask, O2 fluctuates 96-100. Pt alert  Sitting up right in bed. We'll continue to monitor.

## 2018-07-09 NOTE — Progress Notes (Addendum)
SLP Cancellation Note  Patient Details Name: Jose Sellers MRN: [id number] DOB: 09-20-1960   Cancelled treatment:       Reason Eval/Treat Not Completed: Medical issues which prohibited therapy. Respiratory status not stable enough at the moment to attempt POs - rapid response currently assessing. Will f/u as able.   Venita Sheffield Christ Fullenwider 07/09/2018, 9:00 AM  Pollyann Glen, M.A. [place] Acute Environmental education officer [phone] Office [phone]

## 2018-07-09 NOTE — Progress Notes (Signed)
VAST RN consulted to place PIV. Able to place IV with USG, however, patient has minimal places left for PIV's in left arm. Right arm is restricted. VAST RN advised unit RN this patient needs a central line for further access. She agreed and stated she would pass along to MD.

## 2018-07-09 NOTE — Progress Notes (Signed)
This is not a Rapid Response Event: Jose Sellers is requiring more oxygen to achieve sats >94%.   He does not appear to be in any significant distress. He has some intercostal retractions and has coarse bilateral rhonchi.  NTS x 2 passes with good amount of tan secretions.  Will continue to monitor. He is due for HD today.

## 2018-07-09 NOTE — Progress Notes (Signed)
Assisted tele visit to patient with wife.  Rashena Dowling R, RN  

## 2018-07-09 NOTE — Significant Event (Signed)
Rapid Response Event Note  Overview:  Event Type: Respiratory  Arrival time: 6712  Initial Focused Assessment: Rounded on patient as a follow up to RRT call last night.  Noted patient laying in bed, dusky skin, very warm to touch and dry.  No LE edema, AKA site unremarkable, wound vac to left groin, tender to touch -- patient winces to light touch to site.  Very course breath sounds, shallow breathing with retractions.  Patient nods to questions, not able to converse from WOB.     Interventions: -- ABG -- PCXR -- repeat CBC, BMP, Lactic -- may Bipap as needed, pending CXR results per Dr Carlis Abbott -- Asked RN to repeat temp, rectal 100.7  ABG:  pH, Arterial 7.388    pCO2 arterial 45.7 mmHg   pO2, Arterial 60.9Low  mmHg   Bicarbonate 26.7 mmol/L   Acid-Base Excess 2.3High  mmol/L   O2 Saturation 89.2    CXR:  IMPRESSION: 1. Stable opacity and possible associated effusion in the right base. 2. New patchy opacity in left base concerning for an infectious process. Recommend follow-up to resolution.  0911: Dr Carlis Abbott at beside, will transfer to ICU and consult CCM.   Event Summary: Name of Physician Notified: Carlis Abbott, C at [place]    at    Outcome: Stayed in room and stabalized  Event End Time: [name], [name]

## 2018-07-09 NOTE — Anesthesia Postprocedure Evaluation (Signed)
Anesthesia Post Note  Patient: Jose Sellers  Procedure(s) Performed: LEFT GROIN EXPLORATION; LEFT FEMORAL ARTERY EXPLORATION; LIGATION OF LEFT EXTERNAL ILIAC ARTERY; LIGATION OF LEFT FEMORAL ARTERY; APPLICATION OF WOUND VAC LEFT GROIN (Left )     Patient location during evaluation: PACU Anesthesia Type: General Level of consciousness: awake and alert Pain management: pain level controlled Vital Signs Assessment: post-procedure vital signs reviewed and stable Respiratory status: spontaneous breathing, nonlabored ventilation, respiratory function stable and patient connected to nasal cannula oxygen Cardiovascular status: blood pressure returned to baseline and stable Postop Assessment: no apparent nausea or vomiting Anesthetic complications: no    Last Vitals:  Vitals:   07/09/18 0856 07/09/18 1213  BP:  121/60  Pulse: 95 97  Resp: 19 16  Temp: (!) 38.2 C   SpO2: 94% 100%    Last Pain:  Vitals:   07/09/18 0856  TempSrc: Rectal  PainSc:                  [name] COKER

## 2018-07-09 NOTE — Progress Notes (Signed)
Patient's daughter called to get updates. Patient was able to talk to daughter and grand children . Family pray for patient and wish him Happy Easter.

## 2018-07-09 NOTE — Progress Notes (Signed)
Tried to call pt wife on multiple prior occasions this morning to update her on pt decline in condition.  He has worsening resp function.  Need to have discussions with wife of comfort care vs full support vent/feeding tube.  I do not believe he is going to recover unfortunately so comfort care may be best pathway forward if wife agrees.  Ruta Hinds, MD [facility] Office: [phone] Pager: [phone]

## 2018-07-09 NOTE — Procedures (Signed)
Intubation Procedure Note JAHZIAH SIMONIN [id number] 03/13/1961  Procedure: Intubation Indications: Respiratory insufficiency  Procedure Details Consent: Unable to obtain consent because of emergency but wife gave verbal consent over phone. Time Out: Verified patient identification, verified procedure, site/side was marked, verified correct patient position, special equipment/implants available, medications/allergies/relevent history reviewed, required imaging and test results available.  {time out performed:YES  Maximum sterile technique was used including antiseptics, cap, gloves, gown, hand hygiene and mask.  MAC and 3  Initially Mac 3 and ET tube placed but there was promizal obstruction and was difficult tobag. Dr Wallene Dales came to help (Gr 1 view). We used Mac 4 with glidescope and reintubated. Suspect mucus plugging. So decided to do emergent bronchoscopy  Evaluation Hemodynamic Status: BP stable throughout; O2 sats: stable throughout Patient's Current Condition: stable Complications: None Patient did tolerate procedure well. Chest X-ray ordered to verify placement.  CXR: pending.   Brand Males 07/09/2018

## 2018-07-09 NOTE — Progress Notes (Signed)
[facility] ROUNDING NOTE   Subjective:   This is a 58 year old gentleman end-stage renal disease Monday Wednesday Friday dialysis.  He was admitted 06/21/2018 for repair of bleeding left femoropopliteal bypass site.  He had undergone evaluation and graft removal with vein patch 06/15/2018.  He has noncompliance with his dialysis and fluid restriction.  He received an extra dialysis treatment Saturday for 06/2018.  He underwent successful dialysis 07/06/2018 with removal of 2 L.   He underwent left groin exploration oversewing of left profunda with placement of wound VAC 07/20/2018 as an emergency procedure following bleeding from left groin.    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    Cultures MRSA 06/14/2018 and Klebsiella oxytocin 06/06/2018   wound cultures.  This am pt was poorly responsive and hypoxemic, CXR shows increasing RLL densities and new LLL opacities suggesting infection. Intubated in ICU this am , bronch showed purulent secretions from both lower lobes.     Objective:  Vital signs in last 24 hours:  Temp:  [99.1 F (37.3 C)-100.7 F (38.2 C)] 100.7 F (38.2 C) (04/13 0856) Pulse Rate:  [26-97] 97 (04/13 1213) Resp:  [15-25] 16 (04/13 1213) BP: (103-127)/(60-92) 121/60 (04/13 1213) SpO2:  [91 %-100 %] 100 % (04/13 1213) FiO2 (%):  [100 %] 100 % (04/13 1213) Weight:  [66.6 kg] 66.6 kg (04/13 0318)  Weight change:  Filed Weights   07/06/18 1857 07/06/18 1952 07/09/18 0318  Weight: 77.1 kg 78.4 kg 66.6 kg    Intake/Output: No intake/output data recorded.   Intake/Output this shift:  Exam Chronically ill-appearing gentleman CVS-  regular rate and rhythm with no JVP distention RS- CTA no wheezes or rales ABD- BS present soft non-distended EXT- 1-2+ bilat LE edema, left groin wound with wound VAC.  Right AV fistula with thrill and bruit  Dialysis: MWF  4h  78kg  2K/ 2.25 bath  Hep none  RAVF  350/A1.5  - Venofer 100 mg weekly  no ESA -  Calcitrol 0.5 mcg Monday Wednesday Friday  Assessment/ Plan:   Acute resp failure/ bilat PNA: intubated today 4/13, on vent per CCM.   Bleeding/ infection left groin wound.  Status post left femoropopliteal bypass subsequent infection with graft removal 06/15/2018.  Admitted with bleeding and sepsis presented 06/10/2018.  Taken for repair of bleeding by V VS. he underwent for the repair for 07/08/2018 emergently due to recurrent bleeding. Cultures positive for Klebsiella oxytocin and MRSA.  Continues IV Ancef and vancomycin.  anticipate will need at least 3 weeks of antibiotics which would be 07/16/2018.  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 Will review.  Total transfusion 6 units total transfused since 06/19/2018  Nutrition stable with vitamin supplementation  Bones continue calcitriol 0.5 mcg Monday Wednesday Friday. Ca/ p ok  COPD continues on home oxygen  Chronic systolic heart failure with an ejection fraction 25 to 30% continues on Midrin 10 mg twice daily for blood pressure support  Chronic atrial fibrillation amiodarone and was on Eliquis this is been discontinued no anticoagulation at this present time.  Neuropathy Neurontin 100 mg 3 times daily dosed appropriately for renal failure  Diabetes mellitus as per primary team   LOS: [place] '@TODAY''@12'$ :56 PM  Basic Metabolic Panel: Recent Labs  Lab 07/04/18 0707 07/24/2018 0345  07/06/18 0427 07/10/2018 0710 07/09/18 0901  NA 130* 133* 133* 132* 137  K 4.3 4.7 4.7 4.7 5.4*  CL 94*  --  97*  --  99  CO2 25  --  24  --  24  GLUCOSE 119* 140* 100* 135* 82  BUN 40*  --  45*  --  70*  CREATININE 5.29*  --  5.39*  --  7.24*  CALCIUM 8.8*  --  8.7*  --  8.3*  PHOS 3.1  --   --   --   --     Liver Function Tests: Recent Labs  Lab 07/04/18 0707   ALBUMIN 1.9*   No results for input(s): LIPASE, AMYLASE in the last 168 hours. No results for input(s): AMMONIA in the last 168 hours.  CBC: Recent Labs  Lab 07/02/18 1345 07/04/18 0707 07/08/2018 0345 07/06/18 0427 07/19/2018 0231 07/21/2018 0710 07/09/18 0901  WBC 13.8* 8.5  --  11.4* 11.1*  --  9.6  HGB 8.3* 7.4* 8.8* 9.5* 8.7* 9.5* 8.9*  HCT 26.6* 23.5* 26.0* 29.3* 27.7* 28.0* 27.7*  MCV 90.5 93.3  --  90.4 88.8  --  89.4  PLT 264 236  --  235 208  --  225    Cardiac Enzymes: No results for input(s): CKTOTAL, CKMB, CKMBINDEX, TROPONINI in the last 168 hours.  BNP: Invalid input(s): POCBNP  CBG: Recent Labs  Lab 07/08/18 0658 07/08/18 1134 07/08/18 1605 07/08/18 2114 07/09/18 0708  GLUCAP 114* 112* 108* 97 89    Microbiology: Results for orders placed or performed during the hospital encounter of 06/04/2018  Aerobic Culture (superficial specimen)     Status: None   Collection Time: 06/10/2018  6:51 PM  Result Value Ref Range Status   Specimen Description WOUND LEFT GROIN  Final   Special Requests NONE  Final   Gram Stain   Final    ABUNDANT WBC PRESENT,BOTH PMN AND MONONUCLEAR NO ORGANISMS SEEN Performed at [facility], [address], [place], [place] [address]    Culture FEW KLEBSIELLA OXYTOCA  Final   Report Status 06/27/2018 FINAL  Final   Organism ID, Bacteria KLEBSIELLA OXYTOCA  Final      Susceptibility   Klebsiella oxytoca - MIC*    AMPICILLIN >=32 RESISTANT Resistant     CEFAZOLIN <=4 SENSITIVE Sensitive     CEFEPIME <=1 SENSITIVE Sensitive     CEFTAZIDIME <=1 SENSITIVE Sensitive     CEFTRIAXONE <=1 SENSITIVE Sensitive     CIPROFLOXACIN <=0.25 SENSITIVE Sensitive     GENTAMICIN <=1 SENSITIVE Sensitive     IMIPENEM <=0.25 SENSITIVE Sensitive     TRIMETH/SULFA <=20 SENSITIVE Sensitive     AMPICILLIN/SULBACTAM 8 SENSITIVE Sensitive     PIP/TAZO <=4 SENSITIVE Sensitive     Extended ESBL NEGATIVE Sensitive     * FEW KLEBSIELLA OXYTOCA     Coagulation Studies: No results for input(s): LABPROT, INR in the last 72 hours.  Urinalysis: No results for input(s): COLORURINE, LABSPEC, PHURINE, GLUCOSEU, HGBUR, BILIRUBINUR, KETONESUR, PROTEINUR, UROBILINOGEN, NITRITE, LEUKOCYTESUR in the last 72 hours.  Invalid input(s): APPERANCEUR    Imaging: Dg Chest Port 1 View  Result Date: 07/09/2018 CLINICAL DATA:  Respiratory distress.  Aspiration and airway. EXAM: PORTABLE CHEST 1 VIEW COMPARISON:  July 07, 2018 FINDINGS: No pneumothorax. Stable mild cardiomegaly. Stable AICD device. Opacity remains in the right base, possibly with an associated effusion. Mild increased patchy opacity in the left base. IMPRESSION: 1. Stable opacity and possible associated effusion  in the right base. 2. New patchy opacity in left base concerning for an infectious process. Recommend follow-up to resolution. Electronically Signed   By: Dorise Bullion III M.D   On: 07/09/2018 09:08   Dg Chest Port 1 View  Result Date: 07/25/2018 CLINICAL DATA:  Acute respiratory distress. EXAM: PORTABLE CHEST 1 VIEW COMPARISON:  Radiograph of July 06, 2018. FINDINGS: Stable cardiomediastinal silhouette. Left-sided pacemaker is unchanged in position. Sternotomy wires are noted. Atherosclerosis of thoracic aorta is noted. No pneumothorax is noted. Left lung is clear. Slightly increased right basilar opacity is noted concerning for worsening edema or pneumonia and associated pleural effusion. Bony thorax is unremarkable. IMPRESSION: Slightly increased right basilar opacity is noted concerning for worsening edema or pneumonia and associated effusion. Aortic Atherosclerosis (ICD10-I70.0). Electronically Signed   By: Marijo Conception, M.D.   On: 07/09/2018 22:16     Medications:   . ceFEPime (MAXIPIME) IV    . feeding supplement (OSMOLITE 1.2 CAL)    . magnesium sulfate 1 - 4 g bolus IVPB    . vancomycin 1,000 mg (06/29/18 0923)   . acidophilus  1 capsule Oral Daily  .  amiodarone  200 mg Per Tube BID  . aspirin EC  81 mg Oral Daily  . atorvastatin  40 mg Per Tube BID  . calcitRIOL  0.5 mcg Oral Q M,W,F-HD  . chlorhexidine gluconate (MEDLINE KIT)  15 mL Mouth Rinse BID  . Chlorhexidine Gluconate Cloth  6 each Topical Q0600  . darbepoetin (ARANESP) injection - NON-DIALYSIS  100 mcg Subcutaneous Q Fri-1800  . docusate sodium  100 mg Oral Daily  . feeding supplement (PRO-STAT SUGAR FREE 64)  30 mL Per Tube BID  . ferric citrate  420 mg Oral TID WC  . [START ON 07/10/2018] FLUoxetine  20 mg Per Tube Daily  . gabapentin  100 mg Oral Q8H  . heparin injection (subcutaneous)  5,000 Units Subcutaneous Q8H  . insulin aspart  0-9 Units Subcutaneous Q4H  . levalbuterol  0.63 mg Inhalation BID  . mouth rinse  15 mL Mouth Rinse 10 times per day  . midodrine  10 mg Per Tube BID WC  . multivitamin  1 tablet Oral QHS  . pantoprazole (PROTONIX) IV  40 mg Intravenous Q24H   acetaminophen **OR** acetaminophen, bisacodyl, fentaNYL (SUBLIMAZE) injection, fentaNYL (SUBLIMAZE) injection, lidocaine (PF), magnesium sulfate 1 - 4 g bolus IVPB, midazolam, midazolam, oxyCODONE-acetaminophen, phenol, polyethylene glycol

## 2018-07-09 NOTE — Progress Notes (Signed)
Patient's wife called to check on patient and get update.

## 2018-07-10 ENCOUNTER — Inpatient Hospital Stay (HOSPITAL_COMMUNITY): Payer: Medicare Other

## 2018-07-10 DIAGNOSIS — Z7189 Other specified counseling: Secondary | ICD-10-CM

## 2018-07-10 DIAGNOSIS — A419 Sepsis, unspecified organism: Secondary | ICD-10-CM

## 2018-07-10 DIAGNOSIS — Z515 Encounter for palliative care: Secondary | ICD-10-CM

## 2018-07-10 DIAGNOSIS — N179 Acute kidney failure, unspecified: Secondary | ICD-10-CM

## 2018-07-10 DIAGNOSIS — J449 Chronic obstructive pulmonary disease, unspecified: Secondary | ICD-10-CM

## 2018-07-10 DIAGNOSIS — J9600 Acute respiratory failure, unspecified whether with hypoxia or hypercapnia: Secondary | ICD-10-CM

## 2018-07-10 LAB — RENAL FUNCTION PANEL
Albumin: 1.4 g/dL — ABNORMAL LOW (ref 3.5–5.0)
Anion gap: 13 (ref 5–15)
BUN: 50 mg/dL — ABNORMAL HIGH (ref 6–20)
CO2: 24 mmol/L (ref 22–32)
Calcium: 8.1 mg/dL — ABNORMAL LOW (ref 8.9–10.3)
Chloride: 101 mmol/L (ref 98–111)
Creatinine, Ser: 5.41 mg/dL — ABNORMAL HIGH (ref 0.61–1.24)
GFR calc Af Amer: 13 mL/min — ABNORMAL LOW (ref >60)
GFR calc non Af Amer: 11 mL/min — ABNORMAL LOW (ref >60)
Glucose, Bld: 97 mg/dL (ref 70–99)
Phosphorus: 5.2 mg/dL — ABNORMAL HIGH (ref 2.5–4.6)
Potassium: 4.9 mmol/L (ref 3.5–5.1)
Sodium: 138 mmol/L (ref 135–145)

## 2018-07-10 LAB — POCT I-STAT 7, (LYTES, BLD GAS, ICA,H+H)
Acid-Base Excess: 2 mmol/L (ref 0.0–2.0)
Bicarbonate: 27.1 mmol/L (ref 20.0–28.0)
Calcium, Ion: 1.12 mmol/L — ABNORMAL LOW (ref 1.15–1.40)
HCT: 33 % — ABNORMAL LOW (ref 39.0–52.0)
Hemoglobin: 11.2 g/dL — ABNORMAL LOW (ref 13.0–17.0)
O2 Saturation: 98 %
Patient temperature: 98.6
Potassium: 4.7 mmol/L (ref 3.5–5.1)
Sodium: 136 mmol/L (ref 135–145)
TCO2: 28 mmol/L (ref 22–32)
pCO2 arterial: 43.8 mmHg (ref 32.0–48.0)
pH, Arterial: 7.399 (ref 7.350–7.450)
pO2, Arterial: 104 mmHg (ref 83.0–108.0)

## 2018-07-10 LAB — CBC
HCT: 28.1 % — ABNORMAL LOW (ref 39.0–52.0)
Hemoglobin: 8.4 g/dL — ABNORMAL LOW (ref 13.0–17.0)
MCH: 26.8 pg (ref 26.0–34.0)
MCHC: 29.9 g/dL — ABNORMAL LOW (ref 30.0–36.0)
MCV: 89.8 fL (ref 80.0–100.0)
Platelets: 225 10*3/uL (ref 150–400)
RBC: 3.13 MIL/uL — ABNORMAL LOW (ref 4.22–5.81)
RDW: 17.4 % — ABNORMAL HIGH (ref 11.5–15.5)
WBC: 11.5 10*3/uL — ABNORMAL HIGH (ref 4.0–10.5)
nRBC: 0 % (ref 0.0–0.2)

## 2018-07-10 LAB — GLUCOSE, CAPILLARY
Glucose-Capillary: 107 mg/dL — ABNORMAL HIGH (ref 70–99)
Glucose-Capillary: 109 mg/dL — ABNORMAL HIGH (ref 70–99)
Glucose-Capillary: 122 mg/dL — ABNORMAL HIGH (ref 70–99)
Glucose-Capillary: 152 mg/dL — ABNORMAL HIGH (ref 70–99)
Glucose-Capillary: 72 mg/dL (ref 70–99)
Glucose-Capillary: 87 mg/dL (ref 70–99)

## 2018-07-10 LAB — POCT ACTIVATED CLOTTING TIME: Activated Clotting Time: 0 seconds

## 2018-07-10 LAB — MAGNESIUM: Magnesium: 1.9 mg/dL (ref 1.7–2.4)

## 2018-07-10 MED ORDER — NEPRO/CARBSTEADY PO LIQD
1000.0000 mL | ORAL | Status: DC
  Administered 2018-07-10 – 2018-07-11 (×2): 1000 mL
  Filled 2018-07-10 (×3): qty 1000

## 2018-07-10 MED ORDER — SODIUM CHLORIDE 0.9 % IV SOLN
2.0000 g | INTRAVENOUS | Status: DC
  Administered 2018-07-10 – 2018-07-11 (×2): 2 g via INTRAVENOUS
  Filled 2018-07-10 (×5): qty 20

## 2018-07-10 MED ORDER — CHLORHEXIDINE GLUCONATE CLOTH 2 % EX PADS
6.0000 | MEDICATED_PAD | Freq: Every day | CUTANEOUS | Status: DC
  Administered 2018-07-11 – 2018-07-12 (×2): 6 via TOPICAL

## 2018-07-10 MED ORDER — NEPRO/CARBSTEADY PO LIQD
1000.0000 mL | ORAL | Status: DC
  Filled 2018-07-10: qty 1000

## 2018-07-10 MED ORDER — DARBEPOETIN ALFA 100 MCG/0.5ML IJ SOSY
100.0000 ug | PREFILLED_SYRINGE | INTRAMUSCULAR | Status: DC
  Administered 2018-07-11: 100 ug via SUBCUTANEOUS
  Filled 2018-07-10 (×2): qty 0.5

## 2018-07-10 MED ORDER — NEPRO/CARBSTEADY PO LIQD: 1000.0000 mL | ORAL | Status: DC

## 2018-07-10 NOTE — Progress Notes (Signed)
Orth[facility] Tech Progress Note Patient Details:  Jose Sellers 1961-03-05 [id number] Called and placed order with [employer]  Patient ID: Jose Sellers, male   DOB: [birth year], 58 y.o.   MRN: [id number]  Carolynn Comment 07/10/2018, 12:32 PM

## 2018-07-10 NOTE — Progress Notes (Signed)
Nutrition Follow-up  RD working remotely.  DOCUMENTATION CODES:   Not applicable  INTERVENTION:   Change TF via OGT: - Nepro @ 40 ml/hr (960 ml/day) - Pro-stat 30 ml BID - Free water per MD  Tube feeding regimen provides 1928 kcal, 108 grams of protein, and 698 ml of H2O (97% of kcal needs, 100% of protein needs).  NUTRITION DIAGNOSIS:   Increased nutrient needs related to wound healing as evidenced by estimated needs.  Ongoing, being addressed via TF  GOAL:   Patient will meet greater than or equal to 90% of their needs  Met with TF at goal rate  MONITOR:   PO intake, Supplement acceptance, Labs, Weight trends, Skin, I & O's  REASON FOR ASSESSMENT:   Consult Enteral/tube feeding initiation and management  ASSESSMENT:   Patient with PMH significant for CAD, CHF, COPD, DM, ESRD on HD, HLD, PVD, and s/p left fem pop bypass with subsequent infection. Ten days ago had graft removal/vein patch of left fem artery and was sent home with VAC on groin. Presents this admission with left groin bleeding.   4/03 - left groin VAC placed 4/04 - left fem artery repair, VAC placed 4/09 - back to OR for groin exploration 4/11 - left AKA 4/12 - bedside swallow evaluation with recommendations for NPO 4/13 - intubated d/t acute respiratory failure related to aspiration, gastric Cortrak placed and TF initiated  Pt with septic shock, overall poor prognosis per CCM but wife refused DNR. Per VVS note, VAC change again tomorrow.  Levophed off this AM.  Multiple new pressure injuries noted on pt's transfer to ICU. WOC following and believes to be related to SCALE.  Weight down 30 lbs since admission. Unsure how much of this is related to fluid vs true dry weight loss.  Spoke with RN via phone call regarding TF formula change. RN reports Osmolite 1.2 running at 35 ml/hr at time of phone call and being titrated up per order. Per VVS note, pt tolerating well. Will schedule new TF to start  at 1300 when Osmolite 1.2 formula would be switched for a new bottle based on [facility] documentation of hang time.  Patient is currently intubated on ventilator support MV: 13.7 L/min Temp (24hrs), Avg:99 F (37.2 C), Min:97.8 F (36.6 C), Max:100.9 F (38.3 C) BP: 98/43 MAP: 59  Medications reviewed and include: Colace, ferric citrate, SSI q 4 hours, rena-vit, Protonix, IV abx, IV magnesium sulfate 2 grams once  Labs reviewed: phosphorus 5.2 (H), hemoglobin 8.4 (L) CBG's: 107, 87, 72, 99, 79, 76 x 24 hours  Net UF 4/13: 2275 ml I/O's: -12.7 L  Diet Order:   Diet Order            Diet NPO time specified  Diet effective now              EDUCATION NEEDS:   Education needs have been addressed  Skin:  Skin Assessment: Skin Integrity Issues: DTI: sacrum, ischial tuberosity, right hip, right heel Wound Vac: left groin Incisions: left groin (wound VAC as above), left leg s/p AKA Other: discoloration to left thigh  Last BM:  07/08/2018 type 6  Height:   Ht Readings from Last 1 Encounters:  06/08/2018 6' (1.829 m)    Weight:   Wt Readings from Last 1 Encounters:  07/10/18 68 kg    Ideal Body Weight:  72.7 kg (adjusted for L AKA)  BMI:  Body mass index is 20.33 kg/m.  Estimated Nutritional Needs:  Kcal:  1982  Protein:  95-110  Fluid:  UOP + 1000 ml    Gaynell Face, MS, RD, LDN Inpatient Clinical Dietitian Pager: [phone] Weekend/After Hours: [phone]

## 2018-07-10 NOTE — Progress Notes (Addendum)
NAME:  Jose Sellers, MRN:  [id number], DOB:  1960-12-02, LOS: 3 ADMISSION DATE:  06/17/2018, CONSULTATION DATE:  07/09/2018 REFERRING MD:  Dr. Carlis Abbott -VSS CHIEF COMPLAINT:  Hypoxia   Brief History   Jose Sellers is a 58 y.o. male with ESRD on HD MWF who initially was admitted 3/16 through 3/24 for MRSA groin wound infection.  Discharged then returned 3/30 with bleeding from left groin. Required multiple trips to OR for exploration / repair and ultimately left AKA. 4/30 had hypoxia due to probable aspiration. Required transfer to ICU for intubation.  History of present illness   Jose Sellers is a 58 y.o. male who has a PMH as outlined below including but not limited to ESRD on HD MWF,  DM, HTN, CAD, A.fib, AICD, COPD (see "past medical history").  He was admitted 3/30 due to bleeding from left groin after graft removal and vein patch of left femoral artery about 10 days prior.  Prior to this admit, he had admission 3/16 through 3/24 for MRSA groin wound infection.  Course complicated by recurrent bleeding and ultimately ischemia requiring AKA.  See details below.  3/30 to OR for patch angioplasty of left CFA.   4/4 back to OR for continued bleeding.  Had CFA repair, left sartorius rotational muscle flap, 45F drain and negative pressure wound VAC placement. 4/9 back to OR for left groin exploration and oversewing of left profunda, superficial femoral and distal external iliac artery, placement of VAC dressing. 4/11 back to OR for left above knee amputation  Overnight 4/12, he had desaturations and increased O2 requirements.  Had similar issues prior which resolved after HD.  CXR showed possible worsening consolidation.  Initial thoughts were aspiration (per vascular, he had issues with this in the past and currently so weak that has poor cough / gag capabilities.    He was placed on NRB; however, continued to have sats in high 80s to low 90s.  Despite having open eyes, pt also  minimally responsive.  Not able to answer questions or follow commands.  Very weak cough.  PCCM consulted for concern he would require intubation.  I called pt's wife and discussed the case, overall condition and poor prognosis.  Dr. Oneida Alar with vascular also re-iterated same information including chance that pt may not survive this hospitalization and that if he were intubated, there is a risk that he may never be liberated from the ventilator.  Also discussed tube feeds and possibility of needed PEG / trach down the line.  Wife and son understandably emotional; however, not realistic about pt's condition and likelihood for meaningful recovery.  They are requesting that we transfer him to the ICU and proceed with intubation if needed.  Past Medical History  ESRD on HD MWF, OSA, aspiration, PVD, MI, HLD, CAD, CHF, A.fib, AICD, DM, COPD, anxiety.  Significant Hospital Events   3/16 through 3/24 > admitted for MRSA groin wound infection.   3/30 > OR for patch angioplasty of left CFA.   4/4 > OR for continued bleeding.  Had CFA repair, left sartorius rotational muscle flap, 45F drain and negative pressure wound VAC placement. 4/9 > OR for left groin exploration and oversewing of left profunda, superficial femoral and distal external iliac artery, placement of VAC dressing. 4/11 > OR for left above knee amputation. 4/13 > transferred to ICU and intubated.  Consults:  Nephrology PCCM  Procedures:  ETT 4/13>  Significant Diagnostic Tests:  CXR 4/13 > small right effusion,  new opacity left base. CXR 4/14 > bilateral infiltrates r>l, improvement in aeration of right lung  Micro Data:  Lft groin wound 3/19 > MRSA Left groin wound 3/13 > klebsiella (resistant to amp). BAL 4/13 > G- rods  Antimicrobials:  Vanc 3/30 >  Ancef 3/30 / 4/13 Cefepime 4/13 >    Interim history/subjective:  Per RN report, had improved mentation after HD yesterday, however still confused and intermittently  following commands.  Objective   Blood pressure (!) 96/54, pulse (!) 101, temperature 99.6 F (37.6 C), temperature source Oral, resp. rate (!) 25, height 6' (1.829 m), weight 68 kg, SpO2 91 %.    Vent Mode: PRVC FiO2 (%):  [70 %-100 %] 70 % Set Rate:  [16 bmp] 16 bmp Vt Set:  [500 mL-620 mL] 620 mL PEEP:  [5 cmH20] 5 cmH20 Plateau Pressure:  [18 cmH20-23 cmH20] 18 cmH20   Intake/Output Summary (Last 24 hours) at 07/10/2018 1035 Last data filed at 07/10/2018 0900 Gross per 24 hour  Intake 1401.97 ml  Output 2525 ml  Net -1123.03 ml   Filed Weights   07/06/18 1952 07/09/18 0318 07/10/18 0415  Weight: 78.4 kg 66.6 kg 68 kg    Examination: General: awake, intermittently follow commands, has purposeful movements to reach for ETT, older than stated age HENT: ETT in place, anicteric sclera Lungs: Scattered rhonchi Cardiovascular: RRR, no m/r/g Abdomen: soft, NDNT, +BS Extremities: s/p L AKA, no edema Neuro: awake, intermittently will follow commands to squeeze both hands and move R foot; makes spontaneous purposeful movement to reach for ETT  GU: foley in place  Resolved Hospital Problem list     Assessment & Plan:  Acute hypoxic respiratory failure - Likely Aspiration Pneumonia +/- pulmonary edema/ small effusion Hx COPD, ongoing tobacco abuse P:  Maintain full vent support, wean FiO2 as able to maintain O2 sat >90 PAD protocol with PRN fent/ versed for RASS goal 0/-1 BAL culture pending but prelim reveals G- rods - continue cefepime, narrow based on full culture/sensitivity  Hx PAD w/Left infected femoral popliteal bypasswith recurrent bleeding- S/p eval and graft removal with vein patch 3/20 - Repair of bleeding left femoropopliteal bypass site 3/30 - S/p Left AKA 4/11 for ischemic leg - previous MRSA 3/19 of left groin groin and Klebsiella oxytocin 3/30 P: Per VSS  Septic shock: Has underlying low BPs and is on chronic midodrine; had norepinephrine started  overnight, at 68mg, otherwise HD stable. -wean norepi as able, goal MAP>65 for now, though realistically he may have a baseline that is lower -continue midodrine  ESRD on HD - last iHD 4/13 P:  Per Nephrology  Daily Renal panel/ mag Continue daily midodrine 11mBID, wean norepi  Chronic Atrial Fibrillation - previousCHA2DS2-VASc Scoreof 5, previous eliquis d/c by cardiology P:  Tele monitoring Continue amio, asa D/c metoprolol for now as requiring BP support; currently rate controlled  CAD, systolic HF -EF 2576% 8/7/2094P: Continue ASA, statin iHD as above   DM P:  CBG q 4 SSI sensitive   HLD P:  Continue lipitor  Chronic thrombocytopenia - stable Normocytic Anemia  P:  Continue Auryxia Trend CBC Transfuse for Hgb < 7  Protein calorie malnutrition P:  TF- renal restricted per dietary  Best practice:  Diet: TF Pain/Anxiety/Delirium protocol (if indicated): PRNs VAP protocol (if indicated): yes DVT prophylaxis: sq heparin GI prophylaxis: PPI Glucose control: SSI Mobility: BR Code Status: FULL Family Communication: 4/13 Disposition: ICU  Labs   CBC: Recent Labs  Lab 07/04/18 0707  07/06/18 0427 07/16/2018 0231 06/29/2018 0710 07/09/18 0901 07/09/18 1425 07/10/18 0045 07/10/18 0517  WBC 8.5  --  11.4* 11.1*  --  9.6  --   --  11.5*  HGB 7.4*   < > 9.5* 8.7* 9.5* 8.9* 13.3 11.2* 8.4*  HCT 23.5*   < > 29.3* 27.7* 28.0* 27.7* 39.0 33.0* 28.1*  MCV 93.3  --  90.4 88.8  --  89.4  --   --  89.8  PLT 236  --  235 208  --  225  --   --  225   < > = values in this interval not displayed.    Basic Metabolic Panel: Recent Labs  Lab 07/04/18 0707 07/26/2018 0345 07/06/18 0427 07/22/2018 0710 07/09/18 0901 07/09/18 1425 07/10/18 0045 07/10/18 0517  NA 130* 133* 133* 132* 137 135 136 138  K 4.3 4.7 4.7 4.7 5.4* 5.3* 4.7 4.9  CL 94*  --  97*  --  99  --   --  101  CO2 25  --  24  --  24  --   --  24  GLUCOSE 119* 140* 100* 135* 82  --   --  97   BUN 40*  --  45*  --  70*  --   --  50*  CREATININE 5.29*  --  5.39*  --  7.24*  --   --  5.41*  CALCIUM 8.8*  --  8.7*  --  8.3*  --   --  8.1*  MG  --   --   --   --   --   --   --  1.9  PHOS 3.1  --   --   --   --   --   --  5.2*   GFR: Estimated Creatinine Clearance: 14.7 mL/min (A) (by C-G formula based on SCr of 5.41 mg/dL (H)). Recent Labs  Lab 07/06/18 0427 07/06/18 1156 07/06/18 1410 07/04/2018 0231 07/09/18 0901 07/09/18 0908 07/09/18 1244 07/10/18 0517  WBC 11.4*  --   --  11.1* 9.6  --   --  11.5*  LATICACIDVEN  --  1.4 1.2  --   --  1.2 1.3  --     Liver Function Tests: Recent Labs  Lab 07/04/18 0707 07/10/18 0517  ALBUMIN 1.9* 1.4*   No results for input(s): LIPASE, AMYLASE in the last 168 hours. No results for input(s): AMMONIA in the last 168 hours.  ABG    Component Value Date/Time   PHART 7.399 07/10/2018 0045   PCO2ART 43.8 07/10/2018 0045   PO2ART 104.0 07/10/2018 0045   HCO3 27.1 07/10/2018 0045   TCO2 28 07/10/2018 0045   O2SAT 98.0 07/10/2018 0045     Coagulation Profile: No results for input(s): INR, PROTIME in the last 168 hours.  Cardiac Enzymes: No results for input(s): CKTOTAL, CKMB, CKMBINDEX, TROPONINI in the last 168 hours.  HbA1C: Hgb A1c MFr Bld  Date/Time Value Ref Range Status  05/01/2018 11:47 AM 6.0 (H) 4.8 - 5.6 % Final    Comment:    (NOTE) Pre diabetes:          5.7%-6.4% Diabetes:              >6.4% Glycemic control for   <7.0% adults with diabetes     CBG: Recent Labs  Lab 07/09/18 1555 07/09/18 2009 07/09/18 2359 07/10/18 0357 07/10/18 0809  GLUCAP 79 99 72 87 107*  Past Medical History  He,  has a past medical history of A-fib ([place]), AICD (automatic cardioverter/defibrillator) present, Anal fissure, Anxiety, Asthma, CAD (coronary artery disease), CHF (congestive heart failure) ([place]), COPD (chronic obstructive pulmonary disease) ([place]), Degeneration of lumbar or lumbosacral intervertebral disc, DM  (diabetes mellitus), type 2, uncontrolled w/neurologic complication ([place]), Dyspnea, Dysthymic disorder, End stage renal disease on dialysis ([place]), Essential (primary) hypertension, GERD (gastroesophageal reflux disease), History of hiatal hernia, colonic polyps, Hyperlipemia, Myocardial infarction [facility] [place]), Peripheral vascular disease ([place]), Pneumonia, Recurrent major depression ([place]), Sleep apnea, Ventral hernia without obstruction or gangrene, and Wears dentures.   Surgical History    Past Surgical History:  Procedure Laterality Date  . ABDOMINAL AORTOGRAM W/LOWER EXTREMITY N/A 04/20/2018   Procedure: ABDOMINAL AORTOGRAM W/LOWER EXTREMITY;  Surgeon: Elam Dutch, MD;  Location: [place];  Service: Cardiovascular;  Laterality: N/A;  . AMPUTATION Left 07/16/2018   Procedure: LEFT ABOVE KNEE AMPUTATION;  Surgeon: Elam Dutch, MD;  [facility];  Service: Vascular;  Laterality: Left;  . AV FISTULA PLACEMENT Right 02/13/2018   Procedure: CREATION OF RADIOCEPHALIC ARTERIOVENOUS FISTULA RIGHT ARM;  Surgeon: Elam Dutch, MD;  Location: [place];  Service: Vascular;  Laterality: Right;  . CAD s/p cabbage    . CARDIAC DEFIBRILLATOR PLACEMENT  2012  . CATARACT EXTRACTION W/ INTRAOCULAR LENS IMPLANT    . COLONOSCOPY  12/27/2013   Colonic polyps status post polypectomy.  Mild sigmoid diverticulosis. Internal hemorrhoids. Tubular adenoma.   . CORONARY ANGIOPLASTY    . CORONARY STENT PLACEMENT    . ENDARTERECTOMY Left 05/04/2018   Procedure: LEFT FEMORAL ENDARTERECTOMY;  Surgeon: [name], MD;  Location: [facility] OR;  Service: Vascular;  Laterality: Left;  . ESOPHAGOGASTRODUODENOSCOPY  12/01/2016   Mild gastritis.   . FEMORAL ARTERY EXPLORATION Left 06/10/2018   Procedure: LEFT GROIN EXPLORATION;  Surgeon: Elam Dutch, MD;  Location: [place];  Service: Vascular;  Laterality: Left;  . FEMORAL ARTERY EXPLORATION Left 07/10/2018   Procedure: LEFT GROIN EXPLORATION; LEFT FEMORAL ARTERY  EXPLORATION; LIGATION OF LEFT EXTERNAL ILIAC ARTERY; LIGATION OF LEFT FEMORAL ARTERY; APPLICATION OF WOUND VAC LEFT GROIN;  Surgeon: [name], MD;  Location: [place];  Service: Vascular;  Laterality: Left;  . FEMORAL-POPLITEAL BYPASS GRAFT Left 05/04/2018   Procedure: LEFT FEMORAL TO ABOVE KNEE POPLITEAL BYPASS GRAFT USING 6MM X 80CM GORE PROPATEN RINGED GRAFT;  Surgeon: [name], MD;  Location: [place];  Service: Vascular;  Laterality: Left;  . FEMORAL-POPLITEAL BYPASS GRAFT Left 06/14/2018   Procedure: Removal of Infected Left Femoral popliteal Bypass Graft  with vein patch angioplasty of common femoral and Popiteal artery;  Surgeon: [name], MD;  Location: [facility] OR;  Service: Vascular;  Laterality: Left;  . heart bypass    . HERNIA REPAIR    . I&D EXTREMITY Left 06/14/2018   Procedure: IRRIGATION AND DEBRIDEMENT LEFT GROIN WOUND AND LEFT POPLITEAL WOUND.;  Surgeon: [name], MD;  Location: [facility] OR;  Service: Vascular;  Laterality: Left;  . IR REMOVAL TUN CV CATH W/O FL  06/29/2018  . KNEE SURGERY    . PATCH ANGIOPLASTY Left 05/04/2018   Procedure: Left Common Femoral/Profunda Patch Angioplasty using Hemashield Platinum Finesse Patch;  Surgeon: [name], MD;  Location: [place];  Service: Vascular;  Laterality: Left;  . PATCH ANGIOPLASTY Left 06/07/2018   Procedure: PATCH ANGIOPLAST OF LEFT FEMORAL ARTERY;  Surgeon: Elam Dutch, MD;  Location: [place];  Service: Vascular;  Laterality:  Left;  . WOUND EXPLORATION Left 07/02/2018   Procedure: REPAIR OF LEFT COMMON FEMORAL ARTERY; LEFT SARTORIOUS ROTATIONAL MUSCLE FLAP; APPLICATION OF NEGATIVE PRESSURE WOUND VAC LEFT GROIN;  Surgeon: Marty Heck, MD;  Location: [place];  Service: Vascular;  Laterality: Left;     Social History   reports that he has been smoking cigarettes. He has a 8.75 pack-year smoking history. He has never used smokeless tobacco. He reports previous alcohol use. He reports that he does not use drugs.   Family  History   His family history is negative for Colon cancer.   Allergies Allergies  Allergen Reactions  . Albuterol Palpitations    SVT  . Prednisone Shortness Of Breath    Wife stated that patient develops breathing issues.  . Fluticasone Furoate-Vilanterol Palpitations  . Morphine Rash     Home Medications  Prior to Admission medications   Medication Sig Start Date End Date Taking? Authorizing Provider  amiodarone (PACERONE) 200 MG tablet Take 200 mg by mouth 2 (two) times daily.     [provider]  aspirin 81 MG tablet Take 81 mg by mouth daily.  01/20/11   [provider]  atorvastatin (LIPITOR) 40 MG tablet Take 40 mg by mouth 2 (two) times daily.     [provider]  B Complex-C-Folic Acid (DIALYVITE 242) 0.8 MG TABS Take 1 tablet by mouth daily.  12/19/17   [provider]  calcitRIOL (ROCALTROL) 0.25 MCG capsule Take 0.25 mcg by mouth every Monday, Wednesday, and Friday with hemodialysis.     [provider]  esomeprazole (NEXIUM) 40 MG capsule Take 40 mg by mouth daily before breakfast.     [provider]  ferric citrate (AURYXIA) 1 GM 210 MG(Fe) tablet Take 420 mg by mouth 3 (three) times daily with meals.     [provider]  FLUoxetine (PROZAC) 20 MG capsule Take 20 mg by mouth daily.  11/07/17   [provider]  furosemide (LASIX) 40 MG tablet Take 40 mg by mouth See admin instructions. Non dialysis days. Do not take on Monday, Wednesday and Friday    [provider]  gabapentin (NEURONTIN) 300 MG capsule Take 1 capsule (300 mg total) by mouth 3 (three) times daily. 06/22/18   Landis Martins, DPM  Insulin Glargine (LANTUS SOLOSTAR) 100 UNIT/ML Solostar Pen Inject 8-12 Units into the skin at bedtime as needed (blood sugar).     [provider]  insulin lispro (HUMALOG KWIKPEN) 100 UNIT/ML KwikPen Inject 0-32 Units into the skin 3 (three) times daily. Sliding Scale Insulin     [provider]  levalbuterol (XOPENEX) 0.63 MG/3ML nebulizer solution Inhale 0.63 mg into the lungs 2 (two) times daily.  10/09/17   [provider]  lidocaine-prilocaine (EMLA) cream Apply 1 application topically See admin instructions. Apply small amount to access site 1-2 hours prior to dialysis. 01/29/18   [provider]  metoprolol tartrate (LOPRESSOR) 25 MG tablet Take 12.5 mg by mouth 2 (two) times daily.  03/10/18   [provider]  nitroGLYCERIN (NITROSTAT) 0.4 MG SL tablet Place 0.4 mg under the tongue every 5 (five) minutes as needed for chest pain.    [provider]  oxycodone (ROXICODONE) 30 MG immediate release tablet Take 30 mg by mouth every 8 (eight) hours as needed for pain.     [provider]  oxyCODONE-acetaminophen (PERCOCET) 7.5-325 MG tablet Take 1 tablet by mouth every 6 (six) hours as needed for  severe pain. 05/06/18   Dagoberto Ligas, PA-C  SANTYL ointment Apply 1 application topically daily. 05/29/18   [name], MD  tiotropium (SPIRIVA) 18 MCG inhalation capsule Place 18 mcg into inhaler and inhale daily.  03/10/18   [provider]   [name], MD IMTS - PGY3 Pager [phone]

## 2018-07-10 NOTE — Progress Notes (Addendum)
  Progress Note  07/10/2018 9:34 AM 3 Days Post-Op  Subjective:  intubated  Tm 99.6 72'C-947'S systolic HR 96'G-836'O NSR 95% .70 FiO2 (down from 90 and 85%)  Gtts:  Levophed weaned off earlier this am.    Abx:  Vanc/Maxipime  Vitals:   07/10/18 0600 07/10/18 0814  BP: (!) 109/46   Pulse: 96   Resp: 16   Temp:  99.6 F (37.6 C)  SpO2: 97%    Physical Exam: Cardiac:  regular Lungs:  intubated Incisions:  Left groin with wound vac in place with good seal.  Unsure the amount of drainage in canister since vac change.   Extremities:  Left AKA looks good with staples in tact.    CBC    Component Value Date/Time   WBC 11.5 (H) 07/10/2018 0517   RBC 3.13 (L) 07/10/2018 0517   HGB 8.4 (L) 07/10/2018 0517   HCT 28.1 (L) 07/10/2018 0517   PLT 225 07/10/2018 0517   MCV 89.8 07/10/2018 0517   MCH 26.8 07/10/2018 0517   MCHC 29.9 (L) 07/10/2018 0517   RDW 17.4 (H) 07/10/2018 0517   LYMPHSABS 1.2 07/14/2018 1644   MONOABS 0.9 07/13/2018 1644   EOSABS 0.1 07/24/2018 1644   BASOSABS 0.0 07/05/2018 1644    BMET    Component Value Date/Time   NA 138 07/10/2018 0517   K 4.9 07/10/2018 0517   CL 101 07/10/2018 0517   CO2 24 07/10/2018 0517   GLUCOSE 97 07/10/2018 0517   BUN 50 (H) 07/10/2018 0517   CREATININE 5.41 (H) 07/10/2018 0517   CALCIUM 8.1 (L) 07/10/2018 0517   GFRNONAA 11 (L) 07/10/2018 0517   GFRAA 13 (L) 07/10/2018 0517   INR    Component Value Date/Time   INR 1.5 (H) 06/21/2018 1939     Intake/Output Summary (Last 24 hours) at 07/10/2018 0934 Last data filed at 07/10/2018 0700 Gross per 24 hour  Intake 1396.21 ml  Output 2525 ml  Net -1128.79 ml   Assessment:  58 y.o. male is s/p:  Left AKA  3 Days Post-Op  Plan: -pt with respiratory failure now intubated.   -levophed weaned off.  Systolic pressures 29'U-765'Y since then -Bronchial cx revealed >100k GNR.  Pt on Vanc/Maxipime -vac changed per WOC yesterday-noted to have distinct foul odor and  wound bed pale grey/pink with deep fissures.  No red, beefy granulation tissue in the wound.  Unsure amount of drainage since change.  Will mark canister today to check drainage tomorrow. -wean vent per CCM -nutrition:  Tube feeds  -left stump looks good-will order stump sock.  Leontine Locket, PA-C [facility] [phone] 07/10/2018 9:34 AM  I have interviewed the patient and examined the patient. I agree with the findings by the PA. VAC has good seal. For VAC change again tomorrow. I have updated his wife. She wants to be as aggressive as possible.   Tolerating tube feeds.   Gae Gallop, MD [phone]

## 2018-07-10 NOTE — Consult Note (Signed)
Consultation Note Date: 07/10/2018   Patient Name: Jose Sellers  DOB: 03-Sep-1960  MRN: [id number]  Age / Sex: 58 y.o., male  PCP: Raina Mina., MD Referring Physician: [name], MD  Reason for Consultation: Establishing goals of care  HPI/Patient Profile: 58 y.o. male  with past medical history of ESRD, DM, HTN, CAD, systolic HF, AICD, COPD, and afib admitted on 06/21/2018 with bleeding from left groin. Patient had recent admission 3/16-3/24 d/t groin wound infection with MRSA. He had removal and vein patch of left femoral artery. This admission patient to the OR multiple times and eventually required left AKA d/t ischemia. Night of 4/12 patient with decreased responsiveness and hypoxia. He required intubation 4/13. Thought to have aspiration pneumonia. PMT consulted for [place].  Clinical Assessment and Goals of Care: I have reviewed medical records including EPIC notes, labs and imaging, received report from RN, and then spoke with patient's wife, Geraldo Pitter, to discuss diagnosis prognosis, GOC, EOL wishes, disposition and options.  I introduced Palliative Medicine as specialized medical care for people living with serious illness. It focuses on providing relief from the symptoms and stress of a serious illness. The goal is to improve quality of life for both the patient and the family.  We discussed a brief life review of the patient. She tells me they have been married 4 years and they have a son. She describes husband as the family's "rock". Tells me how kind and humble he is.  As far as functional and nutritional status, she tells me of some weakness. Using a cane or wheelchair at times. She tells me he had been doing okay with dialysis and shares that he ate well on days he did not receive dialysis. She tells me of very complicated medical course over the past couple of years with multiple infections, c. Diff, several surgeries, and some  chronic pain.    We discussed his current illness and what it means in the larger context of his on-going co-morbidities.  Natural disease trajectory and expectations at EOL were discussed. Ms. Debroux does seem to understand the gravity of the situation; however, she also remains very hopeful for improvement. She tells me this is the sickest he has ever been. We talked through the many details of Mr. [facility] hospital course and the type of care he is receiving now in ICU.  Right now, Ms Nunley remains focused on improvement telling me many times - "make sure they do everything to save his life" and "he must come home". I explained to her all of the care provided to try to support Mr. Salvato.  I did ask Ms. Bynum to consider how the medical team should respond if Mr. Henneman did not improve - we discussed long-term options such as trach/peg - I detailed for her what mr. Madero's life may look like with these options. She confidently tells me that Mr. Bensinger would never want to live that way. She does not consider that quality of life and shares her opinion that doing those things to Mr. Lengacher would be selfish. I shared my support of her decision for no trach/peg if it came to that.   Emotional support provided throughout conversation as Ms. Yang is struggling with not being able to be in the hospital with her husband.   Ms Gandolfo agreed and requested continued PMT support - we plan to speak again tomorrow about Mr. Felch clinical condition.   Questions and concerns were addressed.  The family was  encouraged to call with questions or concerns.   Primary Decision Maker NEXT OF KIN - spouse - Merion Grimaldo  SUMMARY OF RECOMMENDATIONS   - continue current care - family remains very hopeful for improvement - family did set care limit of NO trach NO peg if it came to that - PMT continue to support family daily  Code Status/Advance Care Planning:  Full code   Symptom Management:   Per CCM  - RN reports patient minimally responsive, no sedation today - received a couple of IV pushes overnight  Additional Recommendations (Limitations, Scope, Preferences):  No Tracheostomy  Prognosis:   Unable to determine  Discharge Planning: To Be Determined      Primary Diagnoses: Present on Admission:  Bleeding   I have reviewed the medical record, interviewed the patient and family, and examined the patient. The following aspects are pertinent.  Past Medical History:  Diagnosis Date   A-fib ([place])    AICD (automatic cardioverter/defibrillator) present    Anal fissure    Anxiety    Asthma    CAD (coronary artery disease)    CHF (congestive heart failure) (HCC)    COPD (chronic obstructive pulmonary disease) (HCC)    Degeneration of lumbar or lumbosacral intervertebral disc    DM (diabetes mellitus), type 2, uncontrolled w/neurologic complication (HCC)    Dyspnea    with exertion   Dysthymic disorder    End stage renal disease on dialysis [facility])    dialysis M-W-F   [employer] in [place] (primary) hypertension    GERD (gastroesophageal reflux disease)    History of hiatal hernia    Hx of colonic polyps    Hyperlipemia    Myocardial infarction ([place])    Peripheral vascular disease (HCC)    Pneumonia    Recurrent major depression (HCC)    Sleep apnea    Ventral hernia without obstruction or gangrene    Wears dentures    Social History   Socioeconomic History   Marital status: Married    Spouse name: Not on file   Number of children: Not on file   Years of education: Not on file   Highest education level: Not on file  Occupational History   Not on file  Social Needs   Financial resource strain: Not on file   Food insecurity:    Worry: Not on file    Inability: Not on file   Transportation needs:    Medical: Not on file    Non-medical: Not on file  Tobacco Use   Smoking status: Current Some Day Smoker     Packs/day: 0.25    Years: 35.00    Pack years: 8.75    Types: Cigarettes   Smokeless tobacco: Never Used  Substance and Sexual Activity   Alcohol use: Not Currently   Drug use: Never   Sexual activity: Not on file  Lifestyle   Physical activity:    Days per week: Not on file    Minutes per session: Not on file   Stress: Not on file  Relationships   Social connections:    Talks on phone: Not on file    Gets together: Not on file    Attends religious service: Not on file    Active member of club or organization: Not on file    Attends meetings of clubs or organizations: Not on file    Relationship status: Not on file  Other Topics Concern   Not on  file  Social History Narrative   Not on file   Family History  Problem Relation Age of Onset   Colon cancer Neg Hx    Scheduled Meds:  acidophilus  1 capsule Oral Daily   amiodarone  200 mg Per Tube BID   aspirin EC  81 mg Oral Daily   atorvastatin  40 mg Per Tube BID   calcitRIOL  0.5 mcg Oral Q M,W,F-HD   chlorhexidine gluconate (MEDLINE KIT)  15 mL Mouth Rinse BID   [START ON 07/11/2018] Chlorhexidine Gluconate Cloth  6 each Topical Q0600   [START ON 07/11/2018] darbepoetin (ARANESP) injection - NON-DIALYSIS  100 mcg Subcutaneous Q Wed-1800   docusate sodium  100 mg Oral Daily   feeding supplement (NEPRO CARB STEADY)  1,000 mL Per Tube Q24H   feeding supplement (PRO-STAT SUGAR FREE 64)  30 mL Per Tube BID   ferric citrate  420 mg Oral TID WC   FLUoxetine  20 mg Per Tube Daily   gabapentin  100 mg Oral Q8H   heparin injection (subcutaneous)  5,000 Units Subcutaneous Q8H   insulin aspart  0-9 Units Subcutaneous Q4H   levalbuterol  0.63 mg Inhalation BID   mouth rinse  15 mL Mouth Rinse 10 times per day   midodrine  10 mg Per Tube BID WC   multivitamin  1 tablet Oral QHS   pantoprazole (PROTONIX) IV  40 mg Intravenous Q24H   Continuous Infusions:  sodium chloride 250 mL (07/09/18 2212)    cefTRIAXone (ROCEPHIN)  IV     magnesium sulfate 1 - 4 g bolus IVPB     norepinephrine (LEVOPHED) Adult infusion 1 mcg/min (07/10/18 1300)   vancomycin Stopped (07/09/18 2105)   PRN Meds:.acetaminophen **OR** acetaminophen, bisacodyl, fentaNYL (SUBLIMAZE) injection, fentaNYL (SUBLIMAZE) injection, lidocaine (PF), magnesium sulfate 1 - 4 g bolus IVPB, midazolam, midazolam, oxyCODONE-acetaminophen, phenol, polyethylene glycol Allergies  Allergen Reactions   Albuterol Palpitations    SVT   Prednisone Shortness Of Breath    Wife stated that patient develops breathing issues.   Fluticasone Furoate-Vilanterol Palpitations   Morphine Rash   Vital Signs: BP (!) 98/44 (BP Location: Left Leg)    Pulse (!) 101    Temp (!) 100.9 F (38.3 C) (Oral)    Resp 17    Ht 6' (1.829 m)    Wt 68 kg    SpO2 97%    BMI 20.33 kg/m  Pain Scale: CPOT POSS *See Group Information*: 1-Acceptable,Awake and alert Pain Score: 0-No pain   SpO2: SpO2: 97 % O2 Device:SpO2: 97 % O2 Flow Rate: .O2 Flow Rate (L/min): 3 L/min  IO: Intake/output summary:   Intake/Output Summary (Last 24 hours) at 07/10/2018 1324 Last data filed at 07/10/2018 1300 Gross per 24 hour  Intake 1674.3 ml  Output 2275 ml  Net -600.7 ml    LBM: Last BM Date: 07/10/2018 Baseline Weight: Weight: 80.7 kg Most recent weight: Weight: 68 kg     Palliative Assessment/Data: UTA     The above conversation was completed via telephone due to the visitor restrictions during the COVID-19 pandemic. Thorough chart review and discussion with necessary members of the care team was completed as part of assessment. All issues were discussed and addressed but no physical exam was performed.   Time Total: 85 minutes Greater than 50%  of this time was spent counseling and coordinating care related to the above assessment and plan.  Juel Burrow, DNP, AGNP-C Palliative Medicine Team [phone] Pager: [phone]

## 2018-07-10 NOTE — Progress Notes (Signed)
[facility] ROUNDING NOTE   Subjective:   This is a 58 year old gentleman end-stage renal disease Monday Wednesday Friday dialysis.  He was admitted 06/08/2018 for repair of bleeding left femoropopliteal bypass site.  He had undergone evaluation and graft removal with vein patch 06/15/2018.  He has noncompliance with his dialysis and fluid restriction.   He underwent left groin exploration oversewing of left profunda with placement of wound VAC 07/03/2018 as an emergency procedure following bleeding from left groin.    He underwent left above-knee amputation 07/21/2018.    Cultures MRSA 06/14/2018 and Klebsiella oxytocin 06/18/2018   wound cultures.  Yesterday the patient developed resp failure and AMS, sent to ICU and intubated. CXR shows large R> L basilar infiltrates.  CXR today shows some improvement L base and some improvement R lung as well.  On IV abx.  Not responding to commands.     Objective:  Vital signs in last 24 hours:  Temp:  [97.8 F (36.6 C)-100.9 F (38.3 C)] 100.9 F (38.3 C) (04/14 1117) Pulse Rate:  [89-101] 101 (04/14 0900) Resp:  [15-27] 25 (04/14 0900) BP: (84-121)/(31-71) 96/54 (04/14 0900) SpO2:  [91 %-100 %] 91 % (04/14 0900) FiO2 (%):  [70 %-100 %] 70 % (04/14 0928) Weight:  [68 kg] 68 kg (04/14 0415)  Weight change: 1.4 kg Filed Weights   07/06/18 1952 07/09/18 0318 07/10/18 0415  Weight: 78.4 kg 66.6 kg 68 kg    Intake/Output: I/O last 3 completed shifts: In: 1396.2 [I.V.:263.8; NG/GT:332.9; IV Piggyback:799.5] Out: 2525 [Emesis/NG output:250; Other:2275]   Intake/Output this shift:  Exam Chronically ill-appearing gentleman on vent, lethargic CVS-  regular rate and rhythm with no JVP distention RS- CTA no wheezes or rales ABD- BS present soft non-distended EXT- 1+ dependent hip edema, no pretib edema, left groin wound with wound VAC.  Right AV fistula with thrill and bruit  Dialysis: MWF  4h  78kg  2K/ 2.25 bath  Hep none  RAVF   350/A1.5  - Venofer 100 mg weekly  no ESA - Calcitrol 0.5 mcg Monday Wednesday Friday  Assessment/ Plan:   Acute resp failure/ bilat PNA: intubated 4/13, on vent per CCM. IV abx per primary.   Bleeding/ infected left groin wound: Status post left femoropopliteal bypass subsequent infection with graft removal 3/20.  Admitted with bleeding and sepsis presented 3/30.  Taken for repair of bleeding by VVS.  He underwent further repair 4/9 emergently due to recurrent bleeding. Cultures positive for Klebsiella oxytocin and MRSA.  Continues IV Ancef and vancomycin.  anticipate will need at least 3 weeks of antibiotics which would be 4/20.  He underwent successful left above-knee amputation 4/11.   ESRD : on HD MWF using AVF. Had HD yesterday. HD tomorrow.   Volume: mild edema LE's, well under dry by 10kg, sp L AKA as well. No volume excess.   Anemia CKD: got darbe 100ug on 4/3, did not get dosed on 4/10. Will reorder for q Wed 100 ug starting 4/15. Marland Kitchen tsat 12% on 4/3 is not getting IV Fe due to acute infection. Total transfusion 6 units total transfused since 06/02/2018  Nutrition stable with vitamin supplementation  Bones continue calcitriol 0.5 mcg Monday Wednesday Friday. Ca/ p ok  COPD continues on home oxygen  Chronic systolic heart failure with an ejection fraction 25 to 30% continues on Midrin 10 mg twice daily for blood pressure support  Chronic atrial fibrillation amiodarone and was on Eliquis this is been discontinued no anticoagulation at  this present time.  Neuropathy Neurontin 100 mg 3 times daily dosed appropriately for renal failure  Diabetes mellitus as per primary team   LOS: [place] _0 _1 :56 PM  Basic Metabolic Panel: Recent Labs  Lab 07/04/18 0707 07/18/2018 0345 07/06/18 0427 07/01/2018 0710 07/09/18 0901 07/09/18 1425 07/10/18 0045 07/10/18 0517  NA 130* 133* 133* 132* 137 135 136 138  K 4.3 4.7 4.7 4.7 5.4* 5.3* 4.7 4.9  CL 94*  --  97*  --  99   --   --  101  CO2 25  --  24  --  24  --   --  24  GLUCOSE 119* 140* 100* 135* 82  --   --  97  BUN 40*  --  45*  --  70*  --   --  50*  CREATININE 5.29*  --  5.39*  --  7.24*  --   --  5.41*  CALCIUM 8.8*  --  8.7*  --  8.3*  --   --  8.1*  MG  --   --   --   --   --   --   --  1.9  PHOS 3.1  --   --   --   --   --   --  5.2*    Liver Function Tests: Recent Labs  Lab 07/04/18 0707 07/10/18 0517  ALBUMIN 1.9* 1.4*   No results for input(s): LIPASE, AMYLASE in the last 168 hours. No results for input(s): AMMONIA in the last 168 hours.  CBC: Recent Labs  Lab 07/04/18 0707  07/06/18 0427 07/16/2018 0231 07/08/2018 0710 07/09/18 0901 07/09/18 1425 07/10/18 0045 07/10/18 0517  WBC 8.5  --  11.4* 11.1*  --  9.6  --   --  11.5*  HGB 7.4*   < > 9.5* 8.7* 9.5* 8.9* 13.3 11.2* 8.4*  HCT 23.5*   < > 29.3* 27.7* 28.0* 27.7* 39.0 33.0* 28.1*  MCV 93.3  --  90.4 88.8  --  89.4  --   --  89.8  PLT 236  --  235 208  --  225  --   --  225   < > = values in this interval not displayed.    Cardiac Enzymes: No results for input(s): CKTOTAL, CKMB, CKMBINDEX, TROPONINI in the last 168 hours.  BNP: Invalid input(s): POCBNP  CBG: Recent Labs  Lab 07/09/18 2009 07/09/18 2359 07/10/18 0357 07/10/18 0809 07/10/18 1121  GLUCAP 99 72 87 107* 109*    Microbiology: Results for orders placed or performed during the hospital encounter of 06/15/2018  Aerobic Culture (superficial specimen)     Status: None   Collection Time: 06/09/2018  6:51 PM  Result Value Ref Range Status   Specimen Description WOUND LEFT GROIN  Final   Special Requests NONE  Final   Gram Stain   Final    ABUNDANT WBC PRESENT,BOTH PMN AND MONONUCLEAR NO ORGANISMS SEEN Performed at [facility], [place] [address], [place], [place] [address]    Culture FEW KLEBSIELLA OXYTOCA  Final   Report Status 06/27/2018 FINAL  Final   Organism ID, Bacteria KLEBSIELLA OXYTOCA  Final      Susceptibility   Klebsiella oxytoca -  MIC*    AMPICILLIN >=32 RESISTANT Resistant     CEFAZOLIN <=4 SENSITIVE Sensitive     CEFEPIME <=1 SENSITIVE Sensitive     CEFTAZIDIME <=1 SENSITIVE Sensitive     CEFTRIAXONE <=1 SENSITIVE Sensitive  CIPROFLOXACIN <=0.25 SENSITIVE Sensitive     GENTAMICIN <=1 SENSITIVE Sensitive     IMIPENEM <=0.25 SENSITIVE Sensitive     TRIMETH/SULFA <=20 SENSITIVE Sensitive     AMPICILLIN/SULBACTAM 8 SENSITIVE Sensitive     PIP/TAZO <=4 SENSITIVE Sensitive     Extended ESBL NEGATIVE Sensitive     * FEW KLEBSIELLA OXYTOCA  Culture, bal-quantitative     Status: Abnormal (Preliminary result)   Collection Time: 07/09/18 12:12 PM  Result Value Ref Range Status   Specimen Description BRONCHIAL ALVEOLAR LAVAGE  Final   Special Requests NONE  Final   Gram Stain   Final    MODERATE WBC PRESENT, PREDOMINANTLY PMN ABUNDANT GRAM NEGATIVE COCCOBACILLI Performed at [facility], [place] [address], [place], [place] [address]    Culture >=100,000 COLONIES/mL KLEBSIELLA PNEUMONIAE (A)  Final   Report Status PENDING  Incomplete    Coagulation Studies: No results for input(s): LABPROT, INR in the last 72 hours.  Urinalysis: No results for input(s): COLORURINE, LABSPEC, PHURINE, GLUCOSEU, HGBUR, BILIRUBINUR, KETONESUR, PROTEINUR, UROBILINOGEN, NITRITE, LEUKOCYTESUR in the last 72 hours.  Invalid input(s): APPERANCEUR    Imaging: Dg Chest Port 1 View  Result Date: 07/10/2018 CLINICAL DATA:  Respiratory failure. EXAM: PORTABLE CHEST 1 VIEW COMPARISON:  07/09/2018 FINDINGS: Endotracheal tube is 4.3 cm above the carina. Feeding tube extends in the abdomen and the tip is beyond the image. More confluent airspace densities in the retrocardiac space. Additional patchy densities at the left lung base. Persistent densities at the right lung base likely associated with a combination airspace disease and pleural fluid. There appears to be slightly improved aeration in the right lung compared to the recent  comparison examination. Stable appearance of the cardiac ICD. Heart size is within normal limits and stable. Patient has median sternotomy wires. IMPRESSION: 1. Bibasilar lung densities are suggestive for airspace disease. Progression or more confluent densities in the retrocardiac space. 2. Right pleural effusion. There may be improved aeration in the right lung. 3. Endotracheal tube is appropriately positioned. Electronically Signed   By: Markus Daft M.D.   On: 07/10/2018 08:05   Dg Chest Port 1 View  Result Date: 07/09/2018 CLINICAL DATA:  Respiratory failure and status post intubation. EXAM: PORTABLE CHEST 1 VIEW COMPARISON:  Film earlier today at 0840 hours FINDINGS: Endotracheal tube now present with the tip approximately 2.5 cm above the carina. Gastric decompression tube extends below the diaphragm. Stable heart size and appearance pacing/ICD device. Lungs show worsening airspace disease the right lung and left lower lung. There is an associated right pleural effusion. No pneumothorax. IMPRESSION: 1. Endotracheal tube tip lies approximately 2.5 cm above the carina. 2. Worsening airspace disease of the right lung and left lower lung. Associated right pleural effusion. Electronically Signed   By: Aletta Edouard M.D.   On: 07/09/2018 13:55   Dg Chest Port 1 View  Result Date: 07/09/2018 CLINICAL DATA:  Respiratory distress.  Aspiration and airway. EXAM: PORTABLE CHEST 1 VIEW COMPARISON:  July 07, 2018 FINDINGS: No pneumothorax. Stable mild cardiomegaly. Stable AICD device. Opacity remains in the right base, possibly with an associated effusion. Mild increased patchy opacity in the left base. IMPRESSION: 1. Stable opacity and possible associated effusion in the right base. 2. New patchy opacity in left base concerning for an infectious process. Recommend follow-up to resolution. Electronically Signed   By: Dorise Bullion III M.D   On: 07/09/2018 09:08     Medications:   . sodium chloride 250 mL  (  07/09/18 2212)  . cefTRIAXone (ROCEPHIN)  IV    . feeding supplement (OSMOLITE 1.2 CAL) 1,000 mL (07/09/18 1505)  . magnesium sulfate 1 - 4 g bolus IVPB    . norepinephrine (LEVOPHED) Adult infusion Stopped (07/10/18 0746)  . vancomycin Stopped (07/09/18 2105)   . acidophilus  1 capsule Oral Daily  . amiodarone  200 mg Per Tube BID  . aspirin EC  81 mg Oral Daily  . atorvastatin  40 mg Per Tube BID  . calcitRIOL  0.5 mcg Oral Q M,W,F-HD  . chlorhexidine gluconate (MEDLINE KIT)  15 mL Mouth Rinse BID  . Chlorhexidine Gluconate Cloth  6 each Topical Q0600  . darbepoetin (ARANESP) injection - NON-DIALYSIS  100 mcg Subcutaneous Q Fri-1800  . docusate sodium  100 mg Oral Daily  . feeding supplement (NEPRO CARB STEADY)  1,000 mL Per Tube Q24H  . feeding supplement (PRO-STAT SUGAR FREE 64)  30 mL Per Tube BID  . ferric citrate  420 mg Oral TID WC  . FLUoxetine  20 mg Per Tube Daily  . gabapentin  100 mg Oral Q8H  . heparin injection (subcutaneous)  5,000 Units Subcutaneous Q8H  . insulin aspart  0-9 Units Subcutaneous Q4H  . levalbuterol  0.63 mg Inhalation BID  . mouth rinse  15 mL Mouth Rinse 10 times per day  . midodrine  10 mg Per Tube BID WC  . multivitamin  1 tablet Oral QHS  . pantoprazole (PROTONIX) IV  40 mg Intravenous Q24H   acetaminophen **OR** acetaminophen, bisacodyl, fentaNYL (SUBLIMAZE) injection, fentaNYL (SUBLIMAZE) injection, lidocaine (PF), magnesium sulfate 1 - 4 g bolus IVPB, midazolam, midazolam, oxyCODONE-acetaminophen, phenol, polyethylene glycol

## 2018-07-11 ENCOUNTER — Inpatient Hospital Stay: Payer: Self-pay

## 2018-07-11 DIAGNOSIS — R0603 Acute respiratory distress: Secondary | ICD-10-CM

## 2018-07-11 LAB — CBC
HCT: 27.1 % — ABNORMAL LOW (ref 39.0–52.0)
Hemoglobin: 8.2 g/dL — ABNORMAL LOW (ref 13.0–17.0)
MCH: 27 pg (ref 26.0–34.0)
MCHC: 30.3 g/dL (ref 30.0–36.0)
MCV: 89.1 fL (ref 80.0–100.0)
Platelets: 245 10*3/uL (ref 150–400)
RBC: 3.04 MIL/uL — ABNORMAL LOW (ref 4.22–5.81)
RDW: 17.9 % — ABNORMAL HIGH (ref 11.5–15.5)
WBC: 12.5 10*3/uL — ABNORMAL HIGH (ref 4.0–10.5)
nRBC: 0 % (ref 0.0–0.2)

## 2018-07-11 LAB — GLUCOSE, CAPILLARY
Glucose-Capillary: 130 mg/dL — ABNORMAL HIGH (ref 70–99)
Glucose-Capillary: 121 mg/dL — ABNORMAL HIGH (ref 70–99)
Glucose-Capillary: 114 mg/dL — ABNORMAL HIGH (ref 70–99)
Glucose-Capillary: 132 mg/dL — ABNORMAL HIGH (ref 70–99)
Glucose-Capillary: 125 mg/dL — ABNORMAL HIGH (ref 70–99)
Glucose-Capillary: 161 mg/dL — ABNORMAL HIGH (ref 70–99)

## 2018-07-11 LAB — RENAL FUNCTION PANEL
Albumin: 1.3 g/dL — ABNORMAL LOW (ref 3.5–5.0)
Anion gap: 15 (ref 5–15)
BUN: 69 mg/dL — ABNORMAL HIGH (ref 6–20)
CO2: 23 mmol/L (ref 22–32)
Calcium: 8 mg/dL — ABNORMAL LOW (ref 8.9–10.3)
Chloride: 98 mmol/L (ref 98–111)
Creatinine, Ser: 6.49 mg/dL — ABNORMAL HIGH (ref 0.61–1.24)
GFR calc Af Amer: 10 mL/min — ABNORMAL LOW (ref >60)
GFR calc non Af Amer: 9 mL/min — ABNORMAL LOW (ref >60)
Glucose, Bld: 143 mg/dL — ABNORMAL HIGH (ref 70–99)
Phosphorus: 3.6 mg/dL (ref 2.5–4.6)
Potassium: 5.4 mmol/L — ABNORMAL HIGH (ref 3.5–5.1)
Sodium: 136 mmol/L (ref 135–145)

## 2018-07-11 LAB — CULTURE, BAL-QUANTITATIVE W GRAM STAIN

## 2018-07-11 LAB — CULTURE, BAL-QUANTITATIVE: Culture: >=100000 — AB

## 2018-07-11 MED ORDER — ASPIRIN 81 MG PO CHEW
81.0000 mg | CHEWABLE_TABLET | Freq: Every day | ORAL | Status: DC
  Administered 2018-07-11 – 2018-07-12 (×2): 81 mg via ORAL
  Filled 2018-07-11 (×2): qty 1

## 2018-07-11 MED ORDER — ADULT MULTIVITAMIN LIQUID CH
15.0000 mL | Freq: Every day | ORAL | Status: DC
  Administered 2018-07-11: 15 mL via ORAL
  Filled 2018-07-11: qty 15

## 2018-07-11 MED ORDER — MIDODRINE HCL 5 MG PO TABS
10.0000 mg | ORAL_TABLET | Freq: Three times a day (TID) | ORAL | Status: DC
  Administered 2018-07-11 (×2): 10 mg
  Filled 2018-07-11 (×3): qty 2

## 2018-07-11 MED ORDER — VANCOMYCIN HCL IN DEXTROSE 1-5 GM/200ML-% IV SOLN
INTRAVENOUS | Status: AC
  Administered 2018-07-11: 1000 mg via INTRAVENOUS
  Filled 2018-07-11: qty 200

## 2018-07-11 MED ORDER — MAGNESIUM SULFATE 2 GM/50ML IV SOLN
2.0000 g | Freq: Once | INTRAVENOUS | Status: AC
  Administered 2018-07-11: 12:00:00 2 g via INTRAVENOUS

## 2018-07-11 MED ORDER — HEPARIN (PORCINE) 25000 UT/250ML-% IV SOLN
1000.0000 [IU]/h | INTRAVENOUS | Status: DC
  Administered 2018-07-11: 900 [IU]/h via INTRAVENOUS
  Filled 2018-07-11: qty 250

## 2018-07-11 MED ORDER — FENTANYL CITRATE (PF) 100 MCG/2ML IJ SOLN
50.0000 ug | Freq: Once | INTRAMUSCULAR | Status: AC
  Administered 2018-07-11: 12:00:00 50 ug via INTRAVENOUS

## 2018-07-11 MED ORDER — ACETAMINOPHEN 160 MG/5ML PO SOLN
650.0000 mg | ORAL | Status: DC | PRN
  Administered 2018-07-12: 650 mg via ORAL
  Filled 2018-07-11 (×2): qty 20.3

## 2018-07-11 MED ORDER — DOCUSATE SODIUM 50 MG/5ML PO LIQD
100.0000 mg | Freq: Every day | ORAL | Status: DC
  Administered 2018-07-11: 12:00:00 100 mg
  Filled 2018-07-11 (×2): qty 10

## 2018-07-11 MED ORDER — CALCITRIOL 0.25 MCG PO CAPS: 0.5000 ug | ORAL_CAPSULE | ORAL | Status: DC

## 2018-07-11 MED ORDER — MIDAZOLAM HCL 2 MG/2ML IJ SOLN
1.0000 mg | Freq: Once | INTRAMUSCULAR | Status: AC
  Administered 2018-07-11: 1 mg via INTRAVENOUS

## 2018-07-11 MED ORDER — FLUOXETINE HCL 20 MG/5ML PO SOLN
20.0000 mg | Freq: Every day | ORAL | Status: DC
  Administered 2018-07-11: 13:00:00 20 mg
  Filled 2018-07-11 (×3): qty 5

## 2018-07-11 MED ORDER — LORAZEPAM 2 MG/ML IJ SOLN: 0.5000 mg | Freq: Once | INTRAMUSCULAR | Status: DC

## 2018-07-11 NOTE — TOC Progression Note (Addendum)
Transition of Care [facility]) - Progression Note    Patient Details  Name: Jose Sellers MRN: [id number] Date of Birth: Jul 10, 1960  Transition of Care [facility]) CM/SW [place] RN, BSN, NCM-BC, ACM-RN [phone] (working remotely) Phone Number: 07/11/2018, 9:47 AM  Clinical Narrative:    Patient is POD#4 L AKA with a wound vac in place and intubated. Patient was active with [facility] for [facility] needs PTA. Palliative care following for GOC, with patients family hopeful for improvement. CM team will continue to follow for progression of care.    Expected Discharge Plan: [place] Barriers to Discharge: Continued Medical Work up  Expected Discharge Plan and Services Expected Discharge Plan: [place]   Discharge Planning Services: CM Consult Post Acute Care Choice: [place], Resumption of Svcs/PTA Provider Living arrangements for the past 2 months: Single Family Home                 DME Arranged: Vac DME Agency: [facility] HH Arranged: RN [place] Agency: [place] ([facility])   Social Determinants of Health (SDOH) Interventions    Readmission Risk Interventions Readmission Risk Prevention Plan 07/11/2018 06/20/2018 06/19/2018  Transportation Screening - - Complete  PCP or Specialist Appt within 3-5 Days - - Complete  HRI or [place] - - Complete  Social Work Consult for [place] Planning/Counseling - - Complete  Palliative Care Screening - - Not Applicable  Medication Review Press photographer) - - Complete  PCP or Specialist appointment within 3-5 days of discharge - Complete -  [place] or [place] - Complete -  SW Recovery Care/Counseling Consult - Complete -  Palliative Care Screening Complete Not Applicable -  [place] - Not Applicable -  Some recent data might be hidden

## 2018-07-11 NOTE — Progress Notes (Addendum)
     Subjective  - Alert and follows commands.  Intubated.   Objective (!) 90/58 98 99.1 F (37.3 C) (Oral) 18 100%  Intake/Output Summary (Last 24 hours) at 07/11/2018 0803 Last data filed at 07/11/2018 0700 Gross per 24 hour  Intake 1858.7 ml  Output 175 ml  Net 1683.7 ml    Right femoral pulse 2+, right foot warm and well perfused, active range of motion intact. Left groin wound vac in place with good seal, 50 cc output in wound vac canister estimated.  Left AKA appears viable. Lungs intubated Heart RRR, periods of V tach  Assessment/Planning: POD # 4 Left AKA  Intubated On HD currently Levophed 3 mcg/min Systolic 96, HR 696 Plan for wound vac change today by [facility]    Roxy Horseman 07/11/2018 8:03 AM --  Laboratory Lab Results: Recent Labs    07/10/18 0517 07/11/18 0256  WBC 11.5* 12.5*  HGB 8.4* 8.2*  HCT 28.1* 27.1*  PLT 225 245   BMET Recent Labs    07/10/18 0517 07/11/18 0256  NA 138 136  K 4.9 5.4*  CL 101 98  CO2 24 23  GLUCOSE 97 143*  BUN 50* 69*  CREATININE 5.41* 6.49*  CALCIUM 8.1* 8.0*    COAG Lab Results  Component Value Date   INR 1.5 (H) 06/05/2018   INR 1.23 05/01/2018   INR 1.08 02/13/2018   No results found for: PTT   I have independently examined patient and agree with PA assessment and plan above. Wound in left groin does not appear to be healing and wound vac will be left off.  I have discussed with patient's wife grim prognosis and she seems to have a decent understanding but does request that she could possibly see him first.  I have consulted palliative care whom I think will be very helpful in this difficult situation.  Wet-to-dry's to his groin for now and continue antibiotics.  Adi Doro C. Donzetta Matters, MD [facility] Office: [phone] Pager: [phone]

## 2018-07-11 NOTE — Consult Note (Signed)
[facility] Nurse wound consult note Patient receiving care in [place]. Reason for Consult: VAC dressing to left groin change. Wound type: Surgical Measurement: 8.3 cm x 3.3 cm x 1.8 cm with undermining at 8 o'clock of 1 cm and at 1 o'clock of 1 cm Wound bed: It looks terrible.  It continues to deteriorate.  There is no pink granulation tissue.  The tissue is dusky grey and there are multiple "pockets"/areas of degradation where the wound bed is actually continuing to break down.  This is true to the patient's left side of the wound.  There are multiple areas of tissue breakdown that are more extensive today compared with Monday.  Dressing procedure/placement/frequency: One piece of black foam remove. One piece of black foam placed. I communicated my concerns via telephone message to [facility] pager number.  My primary recommendation is to STOP [facility] therapy and begin a topic approach.  My concern is that the wound is going to erode into the femoral artery. Monitor the wound area(s) for worsening of condition such as: Signs/symptoms of infection,  Increase in size,  Development of or worsening of odor, Development of pain, or increased pain at the affected locations.  Notify the medical team if any of these develop.  Val Riles, RN, MSN, CWOCN, CNS-BC, pager [phone]

## 2018-07-11 NOTE — Progress Notes (Signed)
[facility] ROUNDING NOTE   Subjective:   This is a 58 year old gentleman end-stage renal disease Monday Wednesday Friday dialysis.  He was admitted 06/08/2018 for repair of bleeding left femoropopliteal bypass site.  He had undergone evaluation and graft removal with vein patch 06/15/2018.  He has noncompliance with his dialysis and fluid restriction.   He underwent left groin exploration oversewing of left profunda with placement of wound VAC 07/16/2018 as an emergency procedure following bleeding from left groin.    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    Cultures MRSA 06/14/2018 and Klebsiella oxytocin 06/05/2018   wound cultures.  Daily Update Patient seen in room and on HD machine.  Moving about some, not responsive. Tmasx 100.9.  Sputum cx's growing Klebs pna.     Objective:  Vital signs in last 24 hours:  Temp:  [98.4 F (36.9 C)-100.2 F (37.9 C)] 98.4 F (36.9 C) (04/15 0800) Pulse Rate:  [93-103] 97 (04/15 1100) Resp:  [14-30] 17 (04/15 1100) BP: (87-116)/(36-78) 106/61 (04/15 1100) SpO2:  [91 %-100 %] 100 % (04/15 1100) FiO2 (%):  [50 %-70 %] 50 % (04/15 0903) Weight:  [66.6 kg] 66.6 kg (04/15 0730)  Weight change: -1.4 kg Filed Weights   07/10/18 0415 07/11/18 0500 07/11/18 0730  Weight: 68 kg 66.6 kg 66.6 kg    Intake/Output: I/O last 3 completed shifts: In: 2521.6 [I.V.:667.1; NG/GT:1561.3; IV Piggyback:293.1] Out: 2770 [Urine:175; Drains:320; Other:2275]   Intake/Output this shift:  Exam Chronically ill-appearing gentleman on vent, lethargic CVS-  regular rate and rhythm with no JVP distention RS- CTA no wheezes or rales ABD- BS present soft non-distended EXT- 1+ dependent hip edema, no pretib edema, left groin wound with wound VAC.  Right AV fistula with thrill and bruit  Dialysis: MWF  4h  78kg  2K/ 2.25 bath  Hep none  RAVF  350/A1.5  - Venofer 100 mg weekly  no ESA - Calcitrol 0.5 mcg Monday Wednesday  Friday  Assessment/ Plan:   Acute resp failure/ bilat PNA: intubated 4/13, on vent per CCM. IV abx per primary. BAL cx's growing Klebs pneumoniae.   Bleeding/ infected left groin wound: Status post left femoropopliteal bypass w subsequent infection with graft removal 3/20, dc'd home. Then admitted with bleeding and sepsis presented 3/30.  Taken for repair of bleeding by VVS.  He underwent further repair 4/9 emergently due to recurrent bleeding. Cultures positive for Klebsiella oxytoca and MRSA.  Continues IV Ancef and vancomycin.  anticipate will need at least 3 weeks of antibiotics which would be 4/20.  Underwent successful left above-knee amputation 4/11.   ESRD : on HD MWF using AVF. HD today, no fluid off.   Volume: minimal edema, well under dry by 10kg, sp L AKA  Hypotension: chronic issue, midodrine ^'d to 27m tid here   Anemia CKD: got darbe 100ug on 4/3, did not get dosed on 4/10. Will reorder for q Wed 100 ug starting 4/15. .Marland Kitchentsat 12% on 4/3 is not getting IV Fe due to acute infection. Total transfusion 6 units total transfused since 06/23/2018  Nutrition stable with vitamin supplementation  Bones continue calcitriol 0.5 mcg Monday Wednesday Friday. Ca/ p ok  COPD continues on home oxygen  CM EF 25-30%  Chronic atrial fibrillation amiodarone and was on Eliquis this is been discontinued no anticoagulation at this present time.  Neuropathy Neurontin 100 mg 3 times daily dosed appropriately for renal failure  Diabetes mellitus as per primary team   LOS: [place]  D Subrina Vecchiarelli _0 _1 :56 PM  Basic Metabolic Panel: Recent Labs  Lab 07/06/18 0427 07/04/2018 0710 07/09/18 0901 07/09/18 1425 07/10/18 0045 07/10/18 0517 07/11/18 0256  NA 133* 132* 137 135 136 138 136  K 4.7 4.7 5.4* 5.3* 4.7 4.9 5.4*  CL 97*  --  99  --   --  101 98  CO2 24  --  24  --   --  24 23  GLUCOSE 100* 135* 82  --   --  97 143*  BUN 45*  --  70*  --   --  50* 69*  CREATININE 5.39*  --  7.24*   --   --  5.41* 6.49*  CALCIUM 8.7*  --  8.3*  --   --  8.1* 8.0*  MG  --   --   --   --   --  1.9  --   PHOS  --   --   --   --   --  5.2* 3.6    Liver Function Tests: Recent Labs  Lab 07/10/18 0517 07/11/18 0256  ALBUMIN 1.4* 1.3*   No results for input(s): LIPASE, AMYLASE in the last 168 hours. No results for input(s): AMMONIA in the last 168 hours.  CBC: Recent Labs  Lab 07/06/18 0427 07/04/2018 0231  07/09/18 0901 07/09/18 1425 07/10/18 0045 07/10/18 0517 07/11/18 0256  WBC 11.4* 11.1*  --  9.6  --   --  11.5* 12.5*  HGB 9.5* 8.7*   < > 8.9* 13.3 11.2* 8.4* 8.2*  HCT 29.3* 27.7*   < > 27.7* 39.0 33.0* 28.1* 27.1*  MCV 90.4 88.8  --  89.4  --   --  89.8 89.1  PLT 235 208  --  225  --   --  225 245   < > = values in this interval not displayed.    Cardiac Enzymes: No results for input(s): CKTOTAL, CKMB, CKMBINDEX, TROPONINI in the last 168 hours.  BNP: Invalid input(s): POCBNP  CBG: Recent Labs  Lab 07/10/18 1558 07/10/18 1945 07/10/18 2334 07/11/18 0421 07/11/18 0823  GLUCAP 122* 130* 152* 121* 114*    Microbiology: Results for orders placed or performed during the hospital encounter of 06/05/2018  Aerobic Culture (superficial specimen)     Status: None   Collection Time: 06/19/2018  6:51 PM  Result Value Ref Range Status   Specimen Description WOUND LEFT GROIN  Final   Special Requests NONE  Final   Gram Stain   Final    ABUNDANT WBC PRESENT,BOTH PMN AND MONONUCLEAR NO ORGANISMS SEEN Performed at [facility], [place] [address], [place], [place] [address]    Culture FEW KLEBSIELLA OXYTOCA  Final   Report Status 06/27/2018 FINAL  Final   Organism ID, Bacteria KLEBSIELLA OXYTOCA  Final      Susceptibility   Klebsiella oxytoca - MIC*    AMPICILLIN >=32 RESISTANT Resistant     CEFAZOLIN <=4 SENSITIVE Sensitive     CEFEPIME <=1 SENSITIVE Sensitive     CEFTAZIDIME <=1 SENSITIVE Sensitive     CEFTRIAXONE <=1 SENSITIVE Sensitive     CIPROFLOXACIN  <=0.25 SENSITIVE Sensitive     GENTAMICIN <=1 SENSITIVE Sensitive     IMIPENEM <=0.25 SENSITIVE Sensitive     TRIMETH/SULFA <=20 SENSITIVE Sensitive     AMPICILLIN/SULBACTAM 8 SENSITIVE Sensitive     PIP/TAZO <=4 SENSITIVE Sensitive     Extended ESBL NEGATIVE Sensitive     * FEW KLEBSIELLA OXYTOCA  Culture, bal-quantitative  Status: Abnormal   Collection Time: 07/09/18 12:12 PM  Result Value Ref Range Status   Specimen Description BRONCHIAL ALVEOLAR LAVAGE  Final   Special Requests NONE  Final   Gram Stain   Final    MODERATE WBC PRESENT, PREDOMINANTLY PMN ABUNDANT GRAM NEGATIVE COCCOBACILLI    Culture (A)  Final    >=100,000 COLONIES/mL KLEBSIELLA PNEUMONIAE >=100,000 COLONIES/mL MORAXELLA CATARRHALIS(BRANHAMELLA) BETA LACTAMASE POSITIVE Performed at [facility], [place] [address] [place], [place] [address]    Report Status 07/11/2018 FINAL  Final   Organism ID, Bacteria KLEBSIELLA PNEUMONIAE (A)  Final      Susceptibility   Klebsiella pneumoniae - MIC*    AMPICILLIN RESISTANT Resistant     CEFAZOLIN <=4 SENSITIVE Sensitive     CEFEPIME <=1 SENSITIVE Sensitive     CEFTAZIDIME <=1 SENSITIVE Sensitive     CEFTRIAXONE <=1 SENSITIVE Sensitive     CIPROFLOXACIN <=0.25 SENSITIVE Sensitive     GENTAMICIN <=1 SENSITIVE Sensitive     IMIPENEM <=0.25 SENSITIVE Sensitive     TRIMETH/SULFA <=20 SENSITIVE Sensitive     AMPICILLIN/SULBACTAM <=2 SENSITIVE Sensitive     PIP/TAZO <=4 SENSITIVE Sensitive     Extended ESBL NEGATIVE Sensitive     * >=100,000 COLONIES/mL KLEBSIELLA PNEUMONIAE    Coagulation Studies: No results for input(s): LABPROT, INR in the last 72 hours.  Urinalysis: No results for input(s): COLORURINE, LABSPEC, PHURINE, GLUCOSEU, HGBUR, BILIRUBINUR, KETONESUR, PROTEINUR, UROBILINOGEN, NITRITE, LEUKOCYTESUR in the last 72 hours.  Invalid input(s): APPERANCEUR    Imaging: Dg Chest Port 1 View  Result Date: 07/10/2018 CLINICAL DATA:  Respiratory failure.  EXAM: PORTABLE CHEST 1 VIEW COMPARISON:  07/09/2018 FINDINGS: Endotracheal tube is 4.3 cm above the carina. Feeding tube extends in the abdomen and the tip is beyond the image. More confluent airspace densities in the retrocardiac space. Additional patchy densities at the left lung base. Persistent densities at the right lung base likely associated with a combination airspace disease and pleural fluid. There appears to be slightly improved aeration in the right lung compared to the recent comparison examination. Stable appearance of the cardiac ICD. Heart size is within normal limits and stable. Patient has median sternotomy wires. IMPRESSION: 1. Bibasilar lung densities are suggestive for airspace disease. Progression or more confluent densities in the retrocardiac space. 2. Right pleural effusion. There may be improved aeration in the right lung. 3. Endotracheal tube is appropriately positioned. Electronically Signed   By: Markus Daft M.D.   On: 07/10/2018 08:05   Dg Chest Port 1 View  Result Date: 07/09/2018 CLINICAL DATA:  Respiratory failure and status post intubation. EXAM: PORTABLE CHEST 1 VIEW COMPARISON:  Film earlier today at 0840 hours FINDINGS: Endotracheal tube now present with the tip approximately 2.5 cm above the carina. Gastric decompression tube extends below the diaphragm. Stable heart size and appearance pacing/ICD device. Lungs show worsening airspace disease the right lung and left lower lung. There is an associated right pleural effusion. No pneumothorax. IMPRESSION: 1. Endotracheal tube tip lies approximately 2.5 cm above the carina. 2. Worsening airspace disease of the right lung and left lower lung. Associated right pleural effusion. Electronically Signed   By: Aletta Edouard M.D.   On: 07/09/2018 13:55     Medications:   . sodium chloride Stopped (07/11/18 0606)  . cefTRIAXone (ROCEPHIN)  IV Stopped (07/10/18 1512)  . magnesium sulfate 1 - 4 g bolus IVPB    . magnesium sulfate  1 - 4 g bolus IVPB    .  norepinephrine (LEVOPHED) Adult infusion 3 mcg/min (07/11/18 0700)  . vancomycin 1,000 mg (07/11/18 1052)   . acidophilus  1 capsule Oral Daily  . amiodarone  200 mg Per Tube BID  . aspirin  81 mg Oral Daily  . atorvastatin  40 mg Per Tube BID  . calcitRIOL  0.5 mcg Per Tube Q M,W,F-HD  . chlorhexidine gluconate (MEDLINE KIT)  15 mL Mouth Rinse BID  . Chlorhexidine Gluconate Cloth  6 each Topical Q0600  . darbepoetin (ARANESP) injection - NON-DIALYSIS  100 mcg Subcutaneous Q Wed-1800  . docusate  100 mg Per Tube Daily  . feeding supplement (NEPRO CARB STEADY)  1,000 mL Per Tube Q24H  . feeding supplement (PRO-STAT SUGAR FREE 64)  30 mL Per Tube BID  . ferric citrate  420 mg Oral TID WC  . FLUoxetine  20 mg Per Tube Daily  . gabapentin  100 mg Oral Q8H  . heparin injection (subcutaneous)  5,000 Units Subcutaneous Q8H  . insulin aspart  0-9 Units Subcutaneous Q4H  . levalbuterol  0.63 mg Inhalation BID  . mouth rinse  15 mL Mouth Rinse 10 times per day  . midodrine  10 mg Per Tube TID WC  . multivitamin  15 mL Oral QHS  . pantoprazole (PROTONIX) IV  40 mg Intravenous Q24H   acetaminophen (TYLENOL) oral liquid 160 mg/5 mL, bisacodyl, fentaNYL (SUBLIMAZE) injection, lidocaine (PF), magnesium sulfate 1 - 4 g bolus IVPB, midazolam, midazolam, oxyCODONE-acetaminophen, phenol, polyethylene glycol

## 2018-07-11 NOTE — Progress Notes (Signed)
Orders for PICC placement , Patient with graph on right arm for HD and has a Pacemaker on the left therefore we can not place a PICC . Recommendation would be place a CVC via right IJ or have a tunneled catheter placed by IR if central access needed. Message to Dr. Don[place]ta Hutching and primary RN aware.

## 2018-07-11 NOTE — Progress Notes (Signed)
Daily Progress Note   Patient Name: Jose Sellers       Date: 07/11/2018 DOB: Jul 30, 1960  Age: 58 y.o. MRN#: [id number] Attending Physician: [name], MD Primary Care Physician: Raina Mina., MD Admit Date: 05/30/2018  Reason for Consultation/Follow-up: Establishing goals of care  Subjective: Per chart review and RN report - patient still minimally responsive, wound vac has been removed, concerns that left groin wound is not healing.   Length of Stay: 16  Current Medications: Scheduled Meds:  . amiodarone  200 mg Per Tube BID  . aspirin  81 mg Oral Daily  . atorvastatin  40 mg Per Tube BID  . calcitRIOL  0.5 mcg Per Tube Q M,W,F-HD  . chlorhexidine gluconate (MEDLINE KIT)  15 mL Mouth Rinse BID  . Chlorhexidine Gluconate Cloth  6 each Topical Q0600  . darbepoetin (ARANESP) injection - NON-DIALYSIS  100 mcg Subcutaneous Q Wed-1800  . docusate  100 mg Per Tube Daily  . feeding supplement (NEPRO CARB STEADY)  1,000 mL Per Tube Q24H  . feeding supplement (PRO-STAT SUGAR FREE 64)  30 mL Per Tube BID  . FLUoxetine  20 mg Per Tube Daily  . gabapentin  100 mg Oral Q8H  . insulin aspart  0-9 Units Subcutaneous Q4H  . levalbuterol  0.63 mg Inhalation BID  . mouth rinse  15 mL Mouth Rinse 10 times per day  . midodrine  10 mg Per Tube TID WC  . multivitamin  15 mL Oral QHS  . pantoprazole (PROTONIX) IV  40 mg Intravenous Q24H    Continuous Infusions: . sodium chloride 10 mL/hr at 07/11/18 1700  . cefTRIAXone (ROCEPHIN)  IV Stopped (07/11/18 1616)  . heparin    . magnesium sulfate 1 - 4 g bolus IVPB    . norepinephrine (LEVOPHED) Adult infusion 10 mcg/min (07/11/18 1700)  . vancomycin 1,000 mg (07/11/18 1052)    PRN Meds: acetaminophen (TYLENOL) oral liquid 160 mg/5 mL, bisacodyl,  fentaNYL (SUBLIMAZE) injection, lidocaine (PF), magnesium sulfate 1 - 4 g bolus IVPB, midazolam, midazolam, oxyCODONE-acetaminophen, phenol, polyethylene glycol          Vital Signs: BP 100/63   Pulse 96   Temp 99.4 F (37.4 C) (Oral)   Resp 18   Ht 6' (1.829 m)   Wt 66.6 kg   SpO2 99%   BMI 19.91 kg/m  SpO2: SpO2: 99 % O2 Device: O2 Device: Ventilator O2 Flow Rate: O2 Flow Rate (L/min): 3 L/min  Intake/output summary:   Intake/Output Summary (Last 24 hours) at 07/11/2018 1902 Last data filed at 07/11/2018 1700 Gross per 24 hour  Intake 1710.5 ml  Output -475 ml  Net 2185.5 ml   LBM: Last BM Date: 07/10/18 Baseline Weight: Weight: 80.7 kg Most recent weight: Weight: 66.6 kg       Palliative Assessment/Data: PPS 10%    Flowsheet Rows     Most Recent Value  Intake Tab  Referral Department  Critical care  Unit at Time of Referral  ICU  Palliative Care Primary Diagnosis  Pulmonary  Date Notified  07/09/18  Palliative Care Type  New Palliative care  Reason for referral  Clarify Goals of Care  Date  of Admission  06/24/2018  Date first seen by Palliative Care  07/10/18  # of days Palliative referral response time  1 Day(s)  # of days IP prior to Palliative referral  14  Clinical Assessment  Psychosocial & Spiritual Assessment  Palliative Care Outcomes  Patient/Family meeting held?  Yes  Who was at the meeting?  wife  Palliative Care Outcomes  Clarified goals of care, Provided advance care planning, Provided psychosocial or spiritual support      Patient Active Problem List   Diagnosis Date Noted  . Acute respiratory failure ([place])   . Goals of care, counseling/discussion   . Palliative care by specialist   . Pressure injury of skin 07/09/2018  . Aspiration into airway   . Bleeding 06/14/2018  . Left groin pain 06/12/2018  . Open wound of left thigh 06/11/2018  . Chronic atrial fibrillation 06/11/2018  . Cellulitis of left groin 06/11/2018  . Thrombocytopenia  ([place]) 06/11/2018  . Wound infection 06/11/2018  . PAD (peripheral artery disease) ([place]) 05/04/2018  . AICD (automatic cardioverter/defibrillator) present   . Typical atrial flutter ([place]) 10/16/2017  . Acute respiratory failure with hypoxia ([place]) 09/08/2017  . C. difficile diarrhea 09/08/2017  . Peritonitis associated with peritoneal dialysis ([place]) 08/16/2017  . At high risk for falls 07/27/2017  . Arteriosclerotic vascular disease 03/16/2017  . Chronic heart disease 03/16/2017  . ESRD on dialysis ([place]) 03/16/2017  . Hypercholesterolemia 03/16/2017  . Hypertensive disorder 03/16/2017  . Anemia of unknown etiology 07/07/2016  . Malaise and fatigue 07/07/2016  . Chronic pain syndrome 06/20/2016  . Secondary hyperparathyroidism ([place]) 12/16/2015  . Stage 5 chronic kidney disease ([place]) 12/16/2015  . Chronic systolic congestive heart failure ([place]) 11/25/2015  . Tobacco use 11/25/2015  . Ventral hernia without obstruction or gangrene 10/26/2015  . Psychosexual dysfunction with inhibited sexual excitement 08/27/2015  . Anxiety 08/26/2015  . COPD (chronic obstructive pulmonary disease) ([place]) 08/26/2015  . DDD (degenerative disc disease), lumbosacral 08/26/2015  . GERD (gastroesophageal reflux disease) 08/26/2015  . Hypothyroidism 08/26/2015  . Microalbuminuria 08/26/2015  . Mixed hyperlipidemia 08/26/2015  . Osteoarthritis of multiple joints 08/26/2015  . Peripheral vascular disease ([place]) 08/26/2015  . Abdominal hernia 06/16/2015  . CAD (coronary artery disease) 04/07/2015  . 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 OSA (obstructive sleep apnea) 04/07/2015  . Essential hypertension 03/06/2015  . Retinopathy, diabetic, bilateral ([place]) 03/06/2015  . Uncontrolled type 2 diabetes mellitus with diabetic polyneuropathy, with long-term current use of insulin ([place]) 03/06/2015  . Atherosclerosis with claudication of extremity ([place]) 08/01/2014  . Mild episode of  recurrent major depressive disorder ([place]) 05/01/2013  . Panic disorder 05/01/2013    Palliative Care Assessment & Plan   HPI: 58 y.o. male  with past medical history of ESRD, DM, HTN, CAD, systolic HF, AICD, COPD, and afib admitted on 06/03/2018 with bleeding from left groin. Patient had recent admission 3/16-3/24 d/t groin wound infection with MRSA. He had removal and vein patch of left femoral artery. This admission patient to the OR multiple times and eventually required left AKA d/t ischemia. Night of 4/12 patient with decreased responsiveness and hypoxia. He required intubation 4/13. Thought to have aspiration pneumonia. PMT consulted for [place].  Assessment: Please refer to consult note 4/14 for initial goals of care conversation with wife.  On follow-up call today, wife had just spoken with Dr. Donzetta Matters and they had discussed patient's poor prognosis. She tells me she needs time to process this news. Emotional support provided to  Ms. Roepke as she speaks about her husband and the difficulties of imagining losing him. She tells me many times "I do not want him to suffer and I am scared that is what is happening". We discussed alternatives to current aggressive care. We discussed liberating patient from ventilator. She understands patient would likely pass away after liberation from ventilator and her main desire is to be at the bedside with him. We discussed that this could be arranged.   She asks to continue conversation tomorrow so that she can have some time to process all of the news she has received today.   Recommendations/Plan:  Decision remains for NO trach/NO peg  Poor prognosis has been shared with wife and she is moving towards comfort care/withdrawal of care - requests time to process news  Her main concern is being at the bedside with the patient for withdrawal of care  PMT will follow up tomorrow  Goals of Care and Additional Recommendations:  Limitations on Scope of  Treatment: No Tracheostomy  Code Status:  Full code  Prognosis:   Unable to determine  Discharge Planning:  To Be Determined  Care plan was discussed with RN and patient's wife  Thank you for allowing the Palliative Medicine Team to assist in the care of this patient.   Total Time 35 minutes Prolonged Time Billed  no    The above conversation was completed via telephone due to the visitor restrictions during the COVID-19 pandemic. Thorough chart review and discussion with necessary members of the care team was completed as part of assessment. All issues were discussed and addressed but no physical exam was performed.       Greater than 50%  of this time was spent counseling and coordinating care related to the above assessment and plan.  Juel Burrow, DNP, [facility] Palliative Medicine Team Team Phone # [phone]  Pager [phone]

## 2018-07-11 NOTE — Progress Notes (Signed)
Text Renal regarding PICC.  Dr Jonnie Finner states OK to proceed with PICC placement.  Plan to place today.

## 2018-07-11 NOTE — Procedures (Signed)
Central Venous Catheter Insertion Procedure Note Jose Sellers [id number] [birth year]  Procedure: Insertion of Central Venous hemodialysis Catheter Indications: IV access, multiple IV medications  Procedure Details Consent: Risks of procedure as well as the alternatives and risks of each were explained to the (patient/caregiver).  Consent for procedure obtained. Time Out: Verified patient identification, verified procedure, site/side was marked, verified correct patient position, special equipment/implants available, medications/allergies/relevent history reviewed, required imaging and test results available.  Performed  Maximum sterile technique was used including antiseptics, cap, gloves, gown, hand hygiene, mask and sheet. Skin prep: Chlorhexidine; local anesthetic administered  Unsuccessful attempt at [place] placement due to visualized clot within the IJ lumen; vessel was compressible.   RIJ restricted due to RUE fistula; L femoral with wound and recent surgery. Order placed for PICC like placement.  Jose Sellers 07/11/2018, 12:55 PM

## 2018-07-11 NOTE — Progress Notes (Addendum)
Dialysis patient with poor venous access  An attempt at placing a left IJ this morning was aborted, couple of tries to cannulate the vein showing needle in the vein by ultrasound but could not continuously aspirate blood Vein was scanned-revealed a clot in the left IJ  Right internal jugular could not be used as patient has his dialysis access on the right side  PICC could not be placed in the left arm secondary to patient's pacemaker  A right groin femoral access was performed  Triple-lumen was placed uneventfully  Anticoagulation is being started for the clot noted in the left internal jugular vein  Risk with anticoagulation does exist-will monitor closely-risk of bleeding Risk of embolization of the clot will have severe consequences as well  Anticoagulation will have to be stopped if any bleeding is noted

## 2018-07-11 NOTE — Progress Notes (Signed)
Assisted televisit  to patient with  Wife.

## 2018-07-11 NOTE — Procedures (Signed)
Central Venous Catheter Insertion Procedure Note Jose Sellers [id number] [birth year]  Procedure: Insertion of Central Venous Catheter Indications: Drug and/or fluid administration  Procedure Details Consent: Unable to obtain consent because of emergent medical necessity. Time Out: Verified patient identification, verified procedure, site/side was marked, verified correct patient position, special equipment/implants available, medications/allergies/relevent history reviewed, required imaging and test results available.  Performed  Maximum sterile technique was used including antiseptics, cap, gloves, gown, hand hygiene, mask and sheet. Skin prep: Chlorhexidine; local anesthetic administered A antimicrobial bonded/coated triple lumen catheter was placed in the right femoral vein due to patient being a dialysis patient using the Seldinger technique.  Evaluation Blood flow good Complications: No apparent complications Patient did tolerate procedure well.  Jose Sellers [name] 07/11/2018, 6:06 PM

## 2018-07-11 NOTE — Progress Notes (Signed)
NAME:  Jose Sellers, MRN:  [id number], DOB:  [birth year], LOS: 52 ADMISSION DATE:  06/05/2018, CONSULTATION DATE:  07/09/2018 REFERRING MD:  Dr. Carlis Abbott -VSS CHIEF COMPLAINT:  Hypoxia   Brief History   Jose Sellers is a 58 y.o. male with ESRD on HD MWF who initially was admitted 3/16 through 3/24 for MRSA groin wound infection.  Discharged then returned 3/30 with bleeding from left groin. Required multiple trips to OR for exploration / repair and ultimately left AKA. 4/30 had hypoxia due to probable aspiration. Required transfer to ICU for intubation.  History of present illness   Jose Sellers is a 58 y.o. male who has a PMH as outlined below including but not limited to ESRD on HD MWF,  DM, HTN, CAD, A.fib, AICD, COPD (see "past medical history").  He was admitted 3/30 due to bleeding from left groin after graft removal and vein patch of left femoral artery about 10 days prior.  Prior to this admit, he had admission 3/16 through 3/24 for MRSA groin wound infection.  Course complicated by recurrent bleeding and ultimately ischemia requiring AKA.  See details below.  3/30 to OR for patch angioplasty of left CFA.   4/4 back to OR for continued bleeding.  Had CFA repair, left sartorius rotational muscle flap, 47F drain and negative pressure wound VAC placement. 4/9 back to OR for left groin exploration and oversewing of left profunda, superficial femoral and distal external iliac artery, placement of VAC dressing. 4/11 back to OR for left above knee amputation  Overnight 4/12, he had desaturations and increased O2 requirements.  Had similar issues prior which resolved after HD.  CXR showed possible worsening consolidation.  Initial thoughts were aspiration (per vascular, he had issues with this in the past and currently so weak that has poor cough / gag capabilities.    He was placed on NRB; however, continued to have sats in high 80s to low 90s.  Despite having open eyes, pt also  minimally responsive.  Not able to answer questions or follow commands.  Very weak cough.  PCCM consulted for concern he would require intubation.  I called pt's wife and discussed the case, overall condition and poor prognosis.  Dr. Oneida Alar with vascular also re-iterated same information including chance that pt may not survive this hospitalization and that if he were intubated, there is a risk that he may never be liberated from the ventilator.  Also discussed tube feeds and possibility of needed PEG / trach down the line.  Wife and son understandably emotional; however, not realistic about pt's condition and likelihood for meaningful recovery.  They are requesting that we transfer him to the ICU and proceed with intubation if needed.  Past Medical History  ESRD on HD MWF, OSA, aspiration, PVD, MI, HLD, CAD, CHF, A.fib, AICD, DM, COPD, anxiety.  Significant Hospital Events   3/16 through 3/24 > admitted for MRSA groin wound infection.   3/30 > OR for patch angioplasty of left CFA.   4/4 > OR for continued bleeding.  Had CFA repair, left sartorius rotational muscle flap, 47F drain and negative pressure wound VAC placement. 4/9 > OR for left groin exploration and oversewing of left profunda, superficial femoral and distal external iliac artery, placement of VAC dressing. 4/11 > OR for left above knee amputation. 4/13 > transferred to ICU and intubated.  Consults:  Nephrology PCCM  Procedures:  ETT 4/13>  Significant Diagnostic Tests:  CXR 4/13 > small right effusion,  new opacity left base. CXR 4/14 > bilateral infiltrates r>l, improvement in aeration of right lung  Micro Data:  Lft groin wound 3/19 > MRSA Left groin wound 3/13 > klebsiella (resistant to amp). BAL 4/13 > Klebsiella  Antimicrobials:  Vanc 3/30 >  Ancef 3/30 / 4/13 Cefepime 4/13 > 4/14 Ceftriaxone 4/14>   Interim history/subjective:  No significant change since yesterday in terms of mentation.  Objective    Blood pressure 106/60, pulse (!) 101, temperature 98.4 F (36.9 C), temperature source Oral, resp. rate (!) 21, height 6' (1.829 m), weight 66.6 kg, SpO2 100 %.    Vent Mode: PRVC FiO2 (%):  [50 %-70 %] 50 % Set Rate:  [16 bmp] 16 bmp Vt Set:  [620 mL] 620 mL PEEP:  [5 cmH20] 5 cmH20 Plateau Pressure:  [17 cmH20-26 cmH20] 26 cmH20   Intake/Output Summary (Last 24 hours) at 07/11/2018 1051 Last data filed at 07/11/2018 0700 Gross per 24 hour  Intake 1738.7 ml  Output 175 ml  Net 1563.7 ml   Filed Weights   07/10/18 0415 07/11/18 0500 07/11/18 0730  Weight: 68 kg 66.6 kg 66.6 kg    Examination: General: awake, intermittently follows commands, some purposeful movements, older than stated age HENT: ETT in place, anicteric sclera Lungs: Scattered rhonchi Cardiovascular: RRR, no m/r/g Abdomen: soft, NDNT, +BS Extremities: s/p L AKA, no edema Neuro: awake, intermittently will follow commands to squeeze both hands and move R foot; makes spontaneous purposeful movement to reach for ETT  GU: foley in place  Resolved Hospital Problem list     Assessment & Plan:  Acute hypoxic respiratory failure - Likely Aspiration Pneumonia +/- pulmonary edema/ small effusion Hx COPD, ongoing tobacco abuse P:  Maintain full vent support, wean FiO2 as able to maintain O2 sat >90 PAD protocol with PRN fent/ versed for RASS goal 0/-1 BAL culture growing kleb pneumo with sensitivities pending narrowed to ceftriaxone for now   Hx PAD w/Left infected femoral popliteal bypasswith recurrent bleeding- S/p eval and graft removal with vein patch 3/20 - Repair of bleeding left femoropopliteal bypass site 3/30 - S/p Left AKA 4/11 for ischemic leg - previous MRSA 3/19 of left groin groin and Klebsiella oxytocin 3/30 P: Per VSS  Septic shock: Has underlying low BPs and is on chronic midodrine; on minimal dose of norepi -wean norepi as able, goal MAP>65 for now, though realistically he may have a  baseline that is lower -increase midodrine to 73m TID  ESRD on HD CRRT per nephro P:  Per Nephrology  Daily Renal panel/ mag Increase midodrine 15mTID, wean norepi  Chronic Atrial Fibrillation - previousCHA2DS2-VASc Scoreof 5, previous eliquis d/c by cardiology P:  Tele monitoring Continue amio, asa D/c metoprolol for now as requiring BP support; currently rate controlled  CAD, systolic HF -EF 2546% 8/5/0354P: Continue ASA, statin iHD as above   DM P:  CBG q 4 SSI sensitive   HLD P:  Continue lipitor  Chronic thrombocytopenia - stable Normocytic Anemia  P:  Continue Auryxia Trend CBC Transfuse for Hgb < 7  Protein calorie malnutrition P:  TF- renal restricted per dietary  Best practice:  Diet: TF Pain/Anxiety/Delirium protocol (if indicated): PRNs VAP protocol (if indicated): yes DVT prophylaxis: sq heparin GI prophylaxis: PPI Glucose control: SSI Mobility: BR Code Status: FULL Family Communication: 4/13 Disposition: ICU  Labs   CBC: Recent Labs  Lab 07/06/18 0427 07/06/2018 0231  07/09/18 0901 07/09/18 1425 07/10/18 0045 07/10/18 0517 07/11/18 0256  WBC 11.4* 11.1*  --  9.6  --   --  11.5* 12.5*  HGB 9.5* 8.7*   < > 8.9* 13.3 11.2* 8.4* 8.2*  HCT 29.3* 27.7*   < > 27.7* 39.0 33.0* 28.1* 27.1*  MCV 90.4 88.8  --  89.4  --   --  89.8 89.1  PLT 235 208  --  225  --   --  225 245   < > = values in this interval not displayed.    Basic Metabolic Panel: Recent Labs  Lab 07/06/18 0427 07/19/2018 0710 07/09/18 0901 07/09/18 1425 07/10/18 0045 07/10/18 0517 07/11/18 0256  NA 133* 132* 137 135 136 138 136  K 4.7 4.7 5.4* 5.3* 4.7 4.9 5.4*  CL 97*  --  99  --   --  101 98  CO2 24  --  24  --   --  24 23  GLUCOSE 100* 135* 82  --   --  97 143*  BUN 45*  --  70*  --   --  50* 69*  CREATININE 5.39*  --  7.24*  --   --  5.41* 6.49*  CALCIUM 8.7*  --  8.3*  --   --  8.1* 8.0*  MG  --   --   --   --   --  1.9  --   PHOS  --   --   --    --   --  5.2* 3.6   GFR: Estimated Creatinine Clearance: 12 mL/min (A) (by C-G formula based on SCr of 6.49 mg/dL (H)). Recent Labs  Lab 07/06/18 1156 07/06/18 1410 07/21/2018 0231 07/09/18 0901 07/09/18 0908 07/09/18 1244 07/10/18 0517 07/11/18 0256  WBC  --   --  11.1* 9.6  --   --  11.5* 12.5*  LATICACIDVEN 1.4 1.2  --   --  1.2 1.3  --   --     Liver Function Tests: Recent Labs  Lab 07/10/18 0517 07/11/18 0256  ALBUMIN 1.4* 1.3*   No results for input(s): LIPASE, AMYLASE in the last 168 hours. No results for input(s): AMMONIA in the last 168 hours.  ABG    Component Value Date/Time   PHART 7.399 07/10/2018 0045   PCO2ART 43.8 07/10/2018 0045   PO2ART 104.0 07/10/2018 0045   HCO3 27.1 07/10/2018 0045   TCO2 28 07/10/2018 0045   O2SAT 98.0 07/10/2018 0045     Coagulation Profile: No results for input(s): INR, PROTIME in the last 168 hours.  Cardiac Enzymes: No results for input(s): CKTOTAL, CKMB, CKMBINDEX, TROPONINI in the last 168 hours.  HbA1C: Hgb A1c MFr Bld  Date/Time Value Ref Range Status  05/01/2018 11:47 AM 6.0 (H) 4.8 - 5.6 % Final    Comment:    (NOTE) Pre diabetes:          5.7%-6.4% Diabetes:              >6.4% Glycemic control for   <7.0% adults with diabetes     CBG: Recent Labs  Lab 07/10/18 1558 07/10/18 1945 07/10/18 2334 07/11/18 0421 07/11/18 0823  GLUCAP 122* 130* 152* 121* 114*    Past Medical History  He,  has a past medical history of A-fib ([place]), AICD (automatic cardioverter/defibrillator) present, Anal fissure, Anxiety, Asthma, CAD (coronary artery disease), CHF (congestive heart failure) ([place]), COPD (chronic obstructive pulmonary disease) ([place]), Degeneration of lumbar or lumbosacral intervertebral disc, DM (diabetes mellitus), type 2, uncontrolled w/neurologic complication ([place]), Dyspnea, Dysthymic disorder,  End stage renal disease on dialysis [facility]), Essential (primary) hypertension, GERD (gastroesophageal reflux  disease), History of hiatal hernia, colonic polyps, Hyperlipemia, Myocardial infarction [facility]), Peripheral vascular disease ([place]), Pneumonia, Recurrent major depression ([place]), Sleep apnea, Ventral hernia without obstruction or gangrene, and Wears dentures.   Surgical History    Past Surgical History:  Procedure Laterality Date  . ABDOMINAL AORTOGRAM W/LOWER EXTREMITY N/A 04/20/2018   Procedure: ABDOMINAL AORTOGRAM W/LOWER EXTREMITY;  Surgeon: Elam Dutch, MD;  Location: [place] [facility] LAB;  Service: Cardiovascular;  Laterality: N/A;  . AMPUTATION Left 07/04/2018   Procedure: LEFT ABOVE KNEE AMPUTATION;  Surgeon: Elam Dutch, MD;  Location: [facility] OR;  Service: Vascular;  Laterality: Left;  . AV FISTULA PLACEMENT Right 02/13/2018   Procedure: CREATION OF RADIOCEPHALIC ARTERIOVENOUS FISTULA RIGHT ARM;  Surgeon: Elam Dutch, MD;  Location: [place];  Service: Vascular;  Laterality: Right;  . CAD s/p cabbage    . CARDIAC DEFIBRILLATOR PLACEMENT  2012  . CATARACT EXTRACTION W/ INTRAOCULAR LENS IMPLANT    . COLONOSCOPY  12/27/2013   Colonic polyps status post polypectomy.  Mild sigmoid diverticulosis. Internal hemorrhoids. Tubular adenoma.   . CORONARY ANGIOPLASTY    . CORONARY STENT PLACEMENT    . ENDARTERECTOMY Left 05/04/2018   Procedure: LEFT FEMORAL ENDARTERECTOMY;  Surgeon: [name], MD;  Location: [facility] OR;  Service: Vascular;  Laterality: Left;  . ESOPHAGOGASTRODUODENOSCOPY  12/01/2016   Mild gastritis.   . FEMORAL ARTERY EXPLORATION Left 06/24/2018   Procedure: LEFT GROIN EXPLORATION;  Surgeon: Elam Dutch, MD;  Location: [place];  Service: Vascular;  Laterality: Left;  . FEMORAL ARTERY EXPLORATION Left 07/22/2018   Procedure: LEFT GROIN EXPLORATION; LEFT FEMORAL ARTERY EXPLORATION; LIGATION OF LEFT EXTERNAL ILIAC ARTERY; LIGATION OF LEFT FEMORAL ARTERY; APPLICATION OF WOUND VAC LEFT GROIN;  Surgeon: [name], MD;  Location: [place];  Service: Vascular;  Laterality: Left;  .  FEMORAL-POPLITEAL BYPASS GRAFT Left 05/04/2018   Procedure: LEFT FEMORAL TO ABOVE KNEE POPLITEAL BYPASS GRAFT USING 6MM X 80CM GORE PROPATEN RINGED GRAFT;  Surgeon: [name], MD;  Location: [place];  Service: Vascular;  Laterality: Left;  . FEMORAL-POPLITEAL BYPASS GRAFT Left 06/14/2018   Procedure: Removal of Infected Left Femoral popliteal Bypass Graft  with vein patch angioplasty of common femoral and Popiteal artery;  Surgeon: [name], MD;  Location: [facility] OR;  Service: Vascular;  Laterality: Left;  . heart bypass    . HERNIA REPAIR    . I&D EXTREMITY Left 06/14/2018   Procedure: IRRIGATION AND DEBRIDEMENT LEFT GROIN WOUND AND LEFT POPLITEAL WOUND.;  Surgeon: [name], MD;  Location: [facility] OR;  Service: Vascular;  Laterality: Left;  . IR REMOVAL TUN CV CATH W/O FL  06/29/2018  . KNEE SURGERY    . PATCH ANGIOPLASTY Left 05/04/2018   Procedure: Left Common Femoral/Profunda Patch Angioplasty using Hemashield Platinum Finesse Patch;  Surgeon: [name], MD;  Location: [place];  Service: Vascular;  Laterality: Left;  . PATCH ANGIOPLASTY Left 05/27/2018   Procedure: PATCH ANGIOPLAST OF LEFT FEMORAL ARTERY;  Surgeon: Elam Dutch, MD;  Location: [place];  Service: Vascular;  Laterality: Left;  . WOUND EXPLORATION Left 07/01/2018   Procedure: REPAIR OF LEFT COMMON FEMORAL ARTERY; LEFT SARTORIOUS ROTATIONAL MUSCLE FLAP; APPLICATION OF NEGATIVE PRESSURE WOUND VAC LEFT GROIN;  Surgeon: Marty Heck, MD;  Location: [place];  Service: Vascular;  Laterality: Left;     Social History   reports that he  has been smoking cigarettes. He has a 8.75 pack-year smoking history. He has never used smokeless tobacco. He reports previous alcohol use. He reports that he does not use drugs.   Family History   His family history is negative for Colon cancer.   Allergies Allergies  Allergen Reactions  . Albuterol Palpitations    SVT  . Prednisone Shortness Of Breath    Wife stated that patient develops  breathing issues.  . Fluticasone Furoate-Vilanterol Palpitations  . Morphine Rash     Home Medications  Prior to Admission medications   Medication Sig Start Date End Date Taking? Authorizing Provider  amiodarone (PACERONE) 200 MG tablet Take 200 mg by mouth 2 (two) times daily.     [provider]  aspirin 81 MG tablet Take 81 mg by mouth daily.  01/20/11   [provider]  atorvastatin (LIPITOR) 40 MG tablet Take 40 mg by mouth 2 (two) times daily.     [provider]  B Complex-C-Folic Acid (DIALYVITE 644) 0.8 MG TABS Take 1 tablet by mouth daily.  12/19/17   [provider]  calcitRIOL (ROCALTROL) 0.25 MCG capsule Take 0.25 mcg by mouth every Monday, Wednesday, and Friday with hemodialysis.     [provider]  esomeprazole (NEXIUM) 40 MG capsule Take 40 mg by mouth daily before breakfast.     [provider]  ferric citrate (AURYXIA) 1 GM 210 MG(Fe) tablet Take 420 mg by mouth 3 (three) times daily with meals.     [provider]  FLUoxetine (PROZAC) 20 MG capsule Take 20 mg by mouth daily.  11/07/17   [provider]  furosemide (LASIX) 40 MG tablet Take 40 mg by mouth See admin instructions. Non dialysis days. Do not take on Monday, Wednesday and Friday    [provider]  gabapentin (NEURONTIN) 300 MG capsule Take 1 capsule (300 mg total) by mouth 3 (three) times daily. 06/22/18   Landis Martins, DPM  Insulin Glargine (LANTUS SOLOSTAR) 100 UNIT/ML Solostar Pen Inject 8-12 Units into the skin at bedtime as needed (blood sugar).     [provider]  insulin lispro (HUMALOG KWIKPEN) 100 UNIT/ML KwikPen Inject 0-32 Units into the skin 3 (three) times daily. Sliding Scale Insulin     [provider]  levalbuterol (XOPENEX) 0.63 MG/3ML nebulizer solution Inhale 0.63 mg into the lungs 2 (two) times daily.  10/09/17   [provider]  lidocaine-prilocaine (EMLA) cream Apply 1 application  topically See admin instructions. Apply small amount to access site 1-2 hours prior to dialysis. 01/29/18   [provider]  metoprolol tartrate (LOPRESSOR) 25 MG tablet Take 12.5 mg by mouth 2 (two) times daily.  03/10/18   [provider]  nitroGLYCERIN (NITROSTAT) 0.4 MG SL tablet Place 0.4 mg under the tongue every 5 (five) minutes as needed for chest pain.    [provider]  oxycodone (ROXICODONE) 30 MG immediate release tablet Take 30 mg by mouth every 8 (eight) hours as needed for pain.     [provider]  oxyCODONE-acetaminophen (PERCOCET) 7.5-325 MG tablet Take 1 tablet by mouth every 6 (six) hours as needed for severe pain. 05/06/18   Dagoberto Ligas, PA-C  SANTYL ointment Apply 1 application topically daily. 05/29/18   [name], MD  tiotropium (SPIRIVA) 18 MCG inhalation capsule Place 18 mcg into inhaler and inhale daily.  03/10/18   [provider]   [name], MD IMTS - PGY3 Pager [phone]

## 2018-07-11 NOTE — Progress Notes (Signed)
ANTICOAGULATION CONSULT NOTE - Initial Consult  Pharmacy Consult for heparin Indication: DVT  Allergies  Allergen Reactions  . Albuterol Palpitations    SVT  . Prednisone Shortness Of Breath    Wife stated that patient develops breathing issues.  . Fluticasone Furoate-Vilanterol Palpitations  . Morphine Rash    Patient Measurements: Height: 6' (182.9 cm) Weight: 146 lb 13.2 oz (66.6 kg) IBW/kg (Calculated) : 77.6 Heparin Dosing Weight: 67kg  Vital Signs: Temp: 99.4 F (37.4 C) (04/15 1550) Temp Source: Oral (04/15 1550) BP: 100/63 (04/15 1700) Pulse Rate: 96 (04/15 1700)  Labs: Recent Labs    07/09/18 0901  07/10/18 0045 07/10/18 0517 07/11/18 0256  HGB 8.9*   < > 11.2* 8.4* 8.2*  HCT 27.7*   < > 33.0* 28.1* 27.1*  PLT 225  --   --  225 245  CREATININE 7.24*  --   --  5.41* 6.49*   < > = values in this interval not displayed.    Estimated Creatinine Clearance: 12 mL/min (A) (by C-G formula based on SCr of 6.49 mg/dL (H)).   Medical History: Past Medical History:  Diagnosis Date  . A-fib ([place])   . AICD (automatic cardioverter/defibrillator) present   . Anal fissure   . Anxiety   . Asthma   . CAD (coronary artery disease)   . CHF (congestive heart failure) ([place])   . COPD (chronic obstructive pulmonary disease) ([place])   . Degeneration of lumbar or lumbosacral intervertebral disc   . DM (diabetes mellitus), type 2, uncontrolled w/neurologic complication ([place])   . Dyspnea    with exertion  . Dysthymic disorder   . End stage renal disease on dialysis [facility])    dialysis M-W-F   [address] in [place]  . Essential (primary) hypertension   . GERD (gastroesophageal reflux disease)   . History of hiatal hernia   . Hx of colonic polyps   . Hyperlipemia   . Myocardial infarction ([place])   . Peripheral vascular disease ([place])   . Pneumonia   . Recurrent major depression ([place])   . Sleep apnea   . Ventral hernia without obstruction or gangrene   . Wears  dentures     Assessment: 58 year old male now with clot in left IJ, new orders to start IV heparin.   He received sq heparin earlier this afternoon so will hold off on further bolusing this afternoon, hgb low but stable at 8.2. Will watch closely for signs and symptoms of bleeding.   Goal of Therapy:  Heparin level 0.3-0.7 units/ml Monitor platelets by anticoagulation protocol: Yes   Plan:  Start heparin infusion at 900 units/hr Check anti-Xa level in 8 hours and daily while on heparin Continue to monitor H&H and platelets  Erin Hearing PharmD., BCPS Clinical Pharmacist 07/11/2018 7:27 PM

## 2018-07-12 DIAGNOSIS — Z515 Encounter for palliative care: Secondary | ICD-10-CM

## 2018-07-12 LAB — CBC
HCT: 26.7 % — ABNORMAL LOW (ref 39.0–52.0)
Hemoglobin: 8 g/dL — ABNORMAL LOW (ref 13.0–17.0)
MCH: 27.4 pg (ref 26.0–34.0)
MCHC: 30 g/dL (ref 30.0–36.0)
MCV: 91.4 fL (ref 80.0–100.0)
Platelets: 235 10*3/uL (ref 150–400)
RBC: 2.92 MIL/uL — ABNORMAL LOW (ref 4.22–5.81)
RDW: 17.7 % — ABNORMAL HIGH (ref 11.5–15.5)
WBC: 14.5 10*3/uL — ABNORMAL HIGH (ref 4.0–10.5)
nRBC: 0 % (ref 0.0–0.2)

## 2018-07-12 LAB — RENAL FUNCTION PANEL
Albumin: 1.3 g/dL — ABNORMAL LOW (ref 3.5–5.0)
Anion gap: 12 (ref 5–15)
BUN: 37 mg/dL — ABNORMAL HIGH (ref 6–20)
CO2: 25 mmol/L (ref 22–32)
Calcium: 7.7 mg/dL — ABNORMAL LOW (ref 8.9–10.3)
Chloride: 100 mmol/L (ref 98–111)
Creatinine, Ser: 4.03 mg/dL — ABNORMAL HIGH (ref 0.61–1.24)
GFR calc Af Amer: 18 mL/min — ABNORMAL LOW (ref >60)
GFR calc non Af Amer: 16 mL/min — ABNORMAL LOW (ref >60)
Glucose, Bld: 165 mg/dL — ABNORMAL HIGH (ref 70–99)
Phosphorus: 2.4 mg/dL — ABNORMAL LOW (ref 2.5–4.6)
Potassium: 4.1 mmol/L (ref 3.5–5.1)
Sodium: 137 mmol/L (ref 135–145)

## 2018-07-12 LAB — HEMOGLOBIN AND HEMATOCRIT, BLOOD
HCT: 30.6 % — ABNORMAL LOW (ref 39.0–52.0)
Hemoglobin: 9 g/dL — ABNORMAL LOW (ref 13.0–17.0)

## 2018-07-12 LAB — GLUCOSE, CAPILLARY
Glucose-Capillary: 119 mg/dL — ABNORMAL HIGH (ref 70–99)
Glucose-Capillary: 144 mg/dL — ABNORMAL HIGH (ref 70–99)
Glucose-Capillary: 166 mg/dL — ABNORMAL HIGH (ref 70–99)
Glucose-Capillary: 167 mg/dL — ABNORMAL HIGH (ref 70–99)

## 2018-07-12 LAB — POTASSIUM: Potassium: 4.1 mmol/L (ref 3.5–5.1)

## 2018-07-12 LAB — HEPARIN LEVEL (UNFRACTIONATED)
Heparin Unfractionated: 0.24 IU/mL — ABNORMAL LOW (ref 0.30–0.70)
Heparin Unfractionated: 0.32 IU/mL (ref 0.30–0.70)

## 2018-07-12 LAB — MAGNESIUM: Magnesium: 2.5 mg/dL — ABNORMAL HIGH (ref 1.7–2.4)

## 2018-07-12 MED ORDER — GLYCOPYRROLATE 0.2 MG/ML IJ SOLN: 0.2000 mg | INTRAMUSCULAR | Status: DC | PRN

## 2018-07-12 MED ORDER — ACETAMINOPHEN 10 MG/ML IV SOLN
1000.0000 mg | Freq: Once | INTRAVENOUS | Status: DC
  Filled 2018-07-12: qty 100

## 2018-07-12 MED ORDER — SODIUM CHLORIDE 0.9 % IV SOLN
0.5000 mg/h | INTRAVENOUS | Status: DC
  Administered 2018-07-12: 0.5 mg/h via INTRAVENOUS
  Filled 2018-07-12: qty 2.5

## 2018-07-12 MED ORDER — HYDROMORPHONE BOLUS VIA INFUSION
0.5000 mg | INTRAVENOUS | Status: DC | PRN
  Filled 2018-07-12: qty 1

## 2018-07-12 MED ORDER — GLYCOPYRROLATE 0.2 MG/ML IJ SOLN
0.2000 mg | INTRAMUSCULAR | Status: DC | PRN
  Administered 2018-07-12: 0.2 mg via INTRAVENOUS
  Filled 2018-07-12: qty 1

## 2018-07-12 MED ORDER — ACETAMINOPHEN 650 MG RE SUPP
650.0000 mg | RECTAL | Status: DC | PRN
  Administered 2018-07-12: 09:00:00 650 mg via RECTAL
  Filled 2018-07-12: qty 1

## 2018-07-12 MED ORDER — GLYCOPYRROLATE 1 MG PO TABS
1.0000 mg | ORAL_TABLET | ORAL | Status: DC | PRN
  Filled 2018-07-12: qty 1

## 2018-07-24 ENCOUNTER — Telehealth: Payer: Self-pay | Admitting: Vascular Surgery

## 2018-07-24 NOTE — Telephone Encounter (Signed)
I had a phone conversation with Jose Sellers wife.  She had called to request a phone call for me.  Was on vacation when her husband died in the hospital.  She expressed appreciation of my care in the care of the rest of the vascular team and the nurses at the hospital.  She was upset that she was unable to be there during her his latter parts of his hospitalization due to the visitation restriction due to COVID virus.  I went through his course of initial surgery to hopefully prevent amputation followed by serous fluid in his left groin followed by infection of his prosthetic graft and removal of all prosthetic material and subsequent to recurrent bleeding and eventual ligation of his external iliac artery and amputation.  He had significant multiple medical comorbidities including end-stage renal disease and severe cardiac dysfunction.  I explained that he simply was not strong enough to recover from all of this.  She is grieving but is trying to achieve closure.  I also spoke with [name]'s daughter who was present who also wanted to express her appreciation for our care.

## 2018-07-27 NOTE — Progress Notes (Signed)
   VASCULAR SURGERY ASSESSMENT & PLAN:   NONHEALING WOUND LEFT GROIN: The wound was examined yesterday and was felt that there was really no chance of this healing.  The VAC was removed.  I inspected the wound again this morning and I would agree that there is no chance of this healing.  Palliative care has been consulted.  I will try to speak to the wife later today.  I think his prognosis is very poor and would support comfort measures given really no options in the left groin for healing.  SUBJECTIVE:   Sedated on the vent  PHYSICAL EXAM:   Vitals:   Jul 15, 2018 0700 2018/07/15 0800 2018/07/15 0843 Jul 15, 2018 0908  BP: (!) 108/58 121/60 99/84   Pulse: (!) 101 (!) 101 (!) 104   Resp: 19 (!) 23 (!) 26   Temp:    100.2 F (37.9 C)  TempSrc:      SpO2: 94% 96%    Weight:      Height:       Left groin wound with no signs of healing  LABS:   Lab Results  Component Value Date   WBC 14.5 (H) 07/15/2018   HGB 8.0 (L) 2018/07/15   HCT 26.7 (L) 2018/07/15   MCV 91.4 Jul 15, 2018   PLT 235 07-15-18   Lab Results  Component Value Date   CREATININE 4.03 (H) 15-Jul-2018   Lab Results  Component Value Date   INR 1.5 (H) 06/20/2018   CBG (last 3)  Recent Labs    07/11/18 2359 2018-07-15 0401 07-15-18 0739  GLUCAP 119* 166* 167*    PROBLEM LIST:    Active Problems:   Bleeding   Aspiration into airway   Pressure injury of skin   Acute respiratory failure (HCC)   Goals of care, counseling/discussion   Palliative care by specialist   Acute respiratory distress   CURRENT MEDS:   . amiodarone  200 mg Per Tube BID  . aspirin  81 mg Oral Daily  . atorvastatin  40 mg Per Tube BID  . calcitRIOL  0.5 mcg Per Tube Q M,W,F-HD  . chlorhexidine gluconate (MEDLINE KIT)  15 mL Mouth Rinse BID  . Chlorhexidine Gluconate Cloth  6 each Topical Q0600  . darbepoetin (ARANESP) injection - NON-DIALYSIS  100 mcg Subcutaneous Q Wed-1800  . docusate  100 mg Per Tube Daily  . feeding supplement  (NEPRO CARB STEADY)  1,000 mL Per Tube Q24H  . feeding supplement (PRO-STAT SUGAR FREE 64)  30 mL Per Tube BID  . FLUoxetine  20 mg Per Tube Daily  . gabapentin  100 mg Oral Q8H  . insulin aspart  0-9 Units Subcutaneous Q4H  . levalbuterol  0.63 mg Inhalation BID  . mouth rinse  15 mL Mouth Rinse 10 times per day  . midodrine  10 mg Per Tube TID WC  . multivitamin  15 mL Oral QHS  . pantoprazole (PROTONIX) IV  40 mg Intravenous Q24H    Deitra Mayo Beeper: [phone] Office: [phone] 07/15/2018

## 2018-07-27 NOTE — Progress Notes (Signed)
Nutrition Brief Note  Chart reviewed. Pt now transitioning to comfort care with withdraw of care planned for 2 PM today once family can be at bedside.  No further nutrition interventions warranted at this time.  RD to d/c TF orders. Please re-consult as needed.    Gaynell Face, MS, RD, LDN Inpatient Clinical Dietitian Pager: [phone] Weekend/After Hours: [phone]

## 2018-07-27 NOTE — Progress Notes (Signed)
CDS notified of plans for terminal withdrawal. Referral number [id number]. Coordinator to get in touch with bedside nurse for further eval. Urban Gibson RN notified of this.

## 2018-07-27 NOTE — Progress Notes (Signed)
Patient terminally extubated per MD order. No complications. Family at bedside. RT will continue to monitor.

## 2018-07-27 NOTE — Progress Notes (Signed)
Pharmacy Antibiotic Note  Jose Sellers is a 58 y.o. male admitted on 06/02/2018 with klebsiella oxytoca wound infection (s/p left fem pop bypass and subsequent infection).    He also had a recent MRSA infection (infected bypass) and was on vancomycin PTA and continues on this. Pharmacy has been consulted for vancomycin dosing.  He is noted with ESRD with last HD on 4/15. 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 Receiving concurrent ceftriaxone. WBC increased slightly to 14.5, afebrile.   Plan: Continue vancomycin 1g IV QHD-MWF - stop date for 4/20 per conversation with vascular Continue Ceftriaxone 2g IV daily  Monitor clinical pic, cx results  Height: 6' (182.9 cm) Weight: 149 lb 0.5 oz (67.6 kg) IBW/kg (Calculated) : 77.6  Temp (24hrs), Avg:100 F (37.8 C), Min:98 F (36.7 C), Max:102.4 F (39.1 C)  Recent Labs  Lab 07/06/18 0427 07/06/18 1120 07/06/18 1156 07/06/18 1410 07/22/2018 0231 07/09/18 0901 07/09/18 0908 07/09/18 1244 07/10/18 0517 07/11/18 0256 2018-07-13 0230  WBC 11.4*  --   --   --  11.1* 9.6  --   --  11.5* 12.5* 14.5*  CREATININE 5.39*  --   --   --   --  7.24*  --   --  5.41* 6.49* 4.03*  LATICACIDVEN  --   --  1.4 1.2  --   --  1.2 1.3  --   --   --   VANCORANDOM  --  23  --   --   --   --   --   --   --   --   --     Estimated Creatinine Clearance: 19.6 mL/min (A) (by C-G formula based on SCr of 4.03 mg/dL (H)).    Allergies  Allergen Reactions  . Albuterol Palpitations    SVT  . Prednisone Shortness Of Breath    Wife stated that patient develops breathing issues.  . Fluticasone Furoate-Vilanterol Palpitations  . Morphine Rash   Antibiotics This Admission  3/30 vanc>> [4/20] (Prior admit 3/16 vanc>> 3/30)  3/31 vanc po (on PTA; started 3/17) >> 4/5 4/3 Ancef>> 4/13 4/13 cefepime x1 4/14 Ceftriaxone >>  Micro Results: 4/13 BAL: >100k Klebsiella 3/30 L groin wound- kleb oxy (sens to ancef) 3/19 L groin wound- MRSA  Levels This Admission: 3/25: VT =  16 on 1gm IV w/ HD MWF 4/1: Pre-HD vanc level= 21 4/10: pre-HD vanc= 23  Antonietta Jewel, PharmD, BCCCP Clinical Pharmacist  Pager: [phone] Phone: [phone] **Pharmacist phone directory can now be found on [email or web address] ([email or web address]).  Listed under [place].

## 2018-07-27 NOTE — Progress Notes (Signed)
I responded to a request from the nurse to provide spiritual support for the patient's family. I visited the patient's room with his wife, son, and two daughters present. I shared words of encouragement and led in prayer. I shared that the Chaplain is available for additional support as needed or requested.    2018-07-18 1400  Clinical Encounter Type  Visited With Patient and family together  Visit Type Spiritual support  Referral From Nurse  Consult/Referral To Chaplain  Spiritual Encounters  Spiritual Needs Prayer;Emotional  Stress Factors  Patient Stress Factors None identified  Family Stress Factors Exhausted    Chaplain Dr Redgie Grayer

## 2018-07-27 NOTE — Progress Notes (Signed)
Assisted  televisit to patient with wife and daughter.

## 2018-07-27 NOTE — Progress Notes (Signed)
Daily Progress Note   Patient Name: Jose Sellers       Date: 2018-08-04 DOB: [birth year]  Age: 58 y.o. MRN#: [id number] Attending Physician: [name], MD Primary Care Physician: Raina Mina., MD Admit Date: 05/30/2018  Reason for Consultation/Follow-up: Establishing goals of care  Subjective: Per chart review and RN report - patient worse today, remains on ventilator support, requiring pressors, minimally responsive, episode of vomiting tube feeding this AM  Length of Stay: 17  Current Medications: Scheduled Meds:  . amiodarone  200 mg Per Tube BID  . aspirin  81 mg Oral Daily  . atorvastatin  40 mg Per Tube BID  . calcitRIOL  0.5 mcg Per Tube Q M,W,F-HD  . chlorhexidine gluconate (MEDLINE KIT)  15 mL Mouth Rinse BID  . Chlorhexidine Gluconate Cloth  6 each Topical Q0600  . darbepoetin (ARANESP) injection - NON-DIALYSIS  100 mcg Subcutaneous Q Wed-1800  . docusate  100 mg Per Tube Daily  . feeding supplement (NEPRO CARB STEADY)  1,000 mL Per Tube Q24H  . feeding supplement (PRO-STAT SUGAR FREE 64)  30 mL Per Tube BID  . FLUoxetine  20 mg Per Tube Daily  . gabapentin  100 mg Oral Q8H  . insulin aspart  0-9 Units Subcutaneous Q4H  . levalbuterol  0.63 mg Inhalation BID  . mouth rinse  15 mL Mouth Rinse 10 times per day  . midodrine  10 mg Per Tube TID WC  . multivitamin  15 mL Oral QHS  . pantoprazole (PROTONIX) IV  40 mg Intravenous Q24H    Continuous Infusions: . sodium chloride 10 mL/hr at 08/04/18 1000  . acetaminophen    . cefTRIAXone (ROCEPHIN)  IV Stopped (07/11/18 1616)  . heparin 1,000 Units/hr (Aug 04, 2018 0430)  . magnesium sulfate 1 - 4 g bolus IVPB    . norepinephrine (LEVOPHED) Adult infusion 12 mcg/min (08-04-2018 1000)  . vancomycin 1,000 mg (07/11/18 1052)    PRN  Meds: acetaminophen (TYLENOL) oral liquid 160 mg/5 mL, acetaminophen, bisacodyl, fentaNYL (SUBLIMAZE) injection, lidocaine (PF), magnesium sulfate 1 - 4 g bolus IVPB, midazolam, oxyCODONE-acetaminophen, phenol, polyethylene glycol          Vital Signs: BP (!) 115/51 (BP Location: Right Leg)   Pulse (!) 106   Temp 99.2 F (37.3 C) (Oral)   Resp (!) 26   Ht 6' (1.829 m)   Wt 67.6 kg   SpO2 92%   BMI 20.21 kg/m  SpO2: SpO2: 92 % O2 Device: O2 Device: Ventilator O2 Flow Rate: O2 Flow Rate (L/min): 3 L/min  Intake/output summary:   Intake/Output Summary (Last 24 hours) at Aug 04, 2018 1023 Last data filed at 2018-08-04 1000 Gross per 24 hour  Intake 1931.78 ml  Output -500 ml  Net 2431.78 ml   LBM: Last BM Date: 07/11/18 Baseline Weight: Weight: 80.7 kg Most recent weight: Weight: 67.6 kg       Palliative Assessment/Data: PPS 10%    Flowsheet Rows     Most Recent Value  Intake Tab  Referral Department  Critical care  Unit at Time of Referral  ICU  Palliative Care Primary Diagnosis  Pulmonary  Date Notified  07/09/18  Palliative Care Type  New Palliative care  Reason for referral  Clarify Goals of Care  Date of Admission  06/07/2018  Date first seen by Palliative Care  07/10/18  # of days Palliative referral response time  1 Day(s)  # of days IP prior to Palliative referral  14  Clinical Assessment  Psychosocial & Spiritual Assessment  Palliative Care Outcomes  Patient/Family meeting held?  Yes  Who was at the meeting?  wife  Palliative Care Outcomes  Clarified goals of care, Provided advance care planning, Provided psychosocial or spiritual support      Patient Active Problem List   Diagnosis Date Noted  . Acute respiratory distress   . Acute respiratory failure ([place])   . Goals of care, counseling/discussion   . Palliative care by specialist   . Pressure injury of skin 07/09/2018  . Aspiration into airway   . Bleeding 06/22/2018  . Left groin pain 06/12/2018   . Open wound of left thigh 06/11/2018  . Chronic atrial fibrillation 06/11/2018  . Cellulitis of left groin 06/11/2018  . Thrombocytopenia ([place]) 06/11/2018  . Wound infection 06/11/2018  . PAD (peripheral artery disease) ([place]) 05/04/2018  . AICD (automatic cardioverter/defibrillator) present   . Typical atrial flutter ([place]) 10/16/2017  . Acute respiratory failure with hypoxia ([place]) 09/08/2017  . C. difficile diarrhea 09/08/2017  . Peritonitis associated with peritoneal dialysis ([place]) 08/16/2017  . At high risk for falls 07/27/2017  . Arteriosclerotic vascular disease 03/16/2017  . Chronic heart disease 03/16/2017  . ESRD on dialysis ([place]) 03/16/2017  . Hypercholesterolemia 03/16/2017  . Hypertensive disorder 03/16/2017  . Anemia of unknown etiology 07/07/2016  . Malaise and fatigue 07/07/2016  . Chronic pain syndrome 06/20/2016  . Secondary hyperparathyroidism ([place]) 12/16/2015  . Stage 5 chronic kidney disease ([place]) 12/16/2015  . Chronic systolic congestive heart failure ([place]) 11/25/2015  . Tobacco use 11/25/2015  . Ventral hernia without obstruction or gangrene 10/26/2015  . Psychosexual dysfunction with inhibited sexual excitement 08/27/2015  . Anxiety 08/26/2015  . COPD (chronic obstructive pulmonary disease) ([place]) 08/26/2015  . DDD (degenerative disc disease), lumbosacral 08/26/2015  . GERD (gastroesophageal reflux disease) 08/26/2015  . Hypothyroidism 08/26/2015  . Microalbuminuria 08/26/2015  . Mixed hyperlipidemia 08/26/2015  . Osteoarthritis of multiple joints 08/26/2015  . Peripheral vascular disease ([place]) 08/26/2015  . Abdominal hernia 06/16/2015  . CAD (coronary artery disease) 04/07/2015  . 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 OSA (obstructive sleep apnea) 04/07/2015  . Essential hypertension 03/06/2015  . 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 Uncontrolled type 2 diabetes mellitus with diabetic polyneuropathy, with  long-term current use of insulin ([place]) 03/06/2015  . Atherosclerosis with claudication of extremity ([place]) 08/01/2014  . Mild episode of recurrent major depressive disorder ([place]) 05/01/2013  . Panic disorder 05/01/2013    Palliative Care Assessment & Plan   HPI: 58 y.o. male  with past medical history of ESRD, DM, HTN, CAD, systolic HF, AICD, COPD, and afib admitted on 06/09/2018 with bleeding from left groin. Patient had recent admission 3/16-3/24 d/t groin wound infection with MRSA. He had removal and vein patch of left femoral artery. This admission patient to the OR multiple times and eventually required left AKA d/t ischemia. Night of 4/12 patient with decreased responsiveness and hypoxia. He required intubation 4/13. Thought to have aspiration pneumonia. PMT consulted for [place].  Assessment: Please refer to consult note 4/14 for initial goals of care conversation with wife.  Received call from RN this AM that patient did not look good.  She was concerned patient is getting closer to end of life and we need to speak to family.  Spoke with wife and shared clinical updates. Wife very emotional about poor prognosis but also expresses understanding and states many times she does not want patient to suffer. We discussed moving forward with plan for liberation from ventilator and focusing on comfort. Wife agrees this is appropriate. Multiple calls between wife, other family members, and bedside staff to arrange plan for this. After much planning, plan worked out for wife and 3 children to come to the hospital today around 2 pm and move forward with comfort/withdrawal of care. We also discussed that patient remains a full code - recommending changing to DNR since we are moving toward a comfort focused plan of care. Wife agrees with this.   RN updated throughout conversations. Currently, patient appears comfortable. No concerns.   Recommendations/Plan:  Decision made to withdraw care today around 2 PM  once wife and 3 children can be at bedside  Recommend using fentanyl or dilaudid and versed for extubation - would avoid morphine is setting of ESRD  Leave pressors until family is at bedside  PMT will be available to support staff and family during this process  Code status changed to DNR  Goals of Care and Additional Recommendations:  Limitations on Scope of Treatment: Full Comfort Care and No Tracheostomy  Code Status:  DNR  Prognosis:   Hours - Days  Discharge Planning:  Anticipated Hospital Death  Care plan was discussed with RN and patient's wife  Thank you for allowing the Palliative Medicine Team to assist in the care of this patient.   Total Time 0845-1030 105 minutes Prolonged Time Billed  yes   The above conversation was completed via telephone due to the visitor restrictions during the COVID-19 pandemic. Thorough chart review and discussion with necessary members of the care team was completed as part of assessment. All issues were discussed and addressed but no physical exam was performed.       Greater than 50%  of this time was spent counseling and coordinating care related to the above assessment and plan.  Juel Burrow, DNP, [facility] Palliative Medicine Team Team Phone # [phone]  Pager [phone]

## 2018-07-27 NOTE — Discharge Summary (Signed)
Discharge Summary    Jose Sellers [birth year] 58 y.o. male  [id number]  Admission Date: 06/15/2018  Discharge Date: Aug 01, 2018  Physician: No att. providers found  Admission Diagnosis: Bleeding from wound [T14.8XXA]   HPI:   This is a 58 y.o. male  s/p left fem pop bypass and subsequent infection.  He had graft removal and vein patch of left femoral artery by Dr Early about 10 days ago.  He was sent home with a VAC on the groin about 5 days ago.  He developed bleeding from the left groin earlier today and was transferred from [facility] ER.  He has not been hypotensive.  He has a wound on the left foot.  Other medical problems include afib COPD CAD.  He has HD MWF.  Hospital Course:  The patient was admitted to the hospital and taken to the operating room on 06/04/2018 and underwent Patch angioplasty left common femoral artery (bovine pericardial patch).  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  On POD 1, he did have acute blood loss anemia and received PRBC's.    On POD 2, he was continued on IV abx for previous cx growing MRSA and Klebsiella on this admission.  He will need several weeks of abx.    On POD 3, pt did have moderate serosanguinous drainage from the groin.  Possible vac for drainage.  Dr. Donnetta Hutching did have long discussion with pt and his wife by telephone.  He explained the concern regarding the recurrent infection in his left groin now with a bovine pericardial patch in place.  Feel that he is at extremely high risk for ongoing patch disruption.  We will continue local wound care.    On 07/26/2018, Per Dr. Carlis Abbott:  I was called this afternoon by nursing who while he was up walking he had profuse bleeding from his left groin.  Have to suspect that he has disrupted his patch again.  We will post him emergently to go the OR for left groin exploration and common femoral artery repair.  I discussed with  his wife on the phone that he is exceedingly high risk for limb loss and this is a difficult situation given now his 3rd trip to the OR.  On 07/11/2018 he was taken to the operating room and underwent: 1.  Left common femoral artery repair  2.  Left sartorius rotational muscle flap 3.  19 French round Blake drain in left sartorius space 4.  Negative pressure wound VAC placement    Findings: The patch had one focal area of hemorrhage on the lateral margin proximally.  It was controlled with one single 5-0 Prolene in figure of eight fashion.  This was reinforced with several additional 5-0 Prolene sutures.  The rest of the patch appeared intact and there was no other evidence of active hemorrhage.  The common femoral artery was very tenuous and friable.  Ultimately there was no good soft tissue coverage in the wound bed and a rotational sartorius muscle flap was placed over the artery.  It should be noted that his sartorius was very small and atrophied and there was significant inflammation in the left groin that made dissection difficult.  The pt tolerated the procedure well and was transported to the PACU in stable condition.   On 07/01/2018, he remained on bedrest.  His artery is very tenuous and high risk for re-bleed given infection.    On 07/02/2018, pt wound vac had  good seal and JP with addition 40cc out overnight.  IV abx continued.  Prevalon boot ordered for left foot.  Pt aware of high risk for re-bleeding.    On 07/03/2018, he is not eating and has severe protein calorie malnutrition, which is limiting his wound healing.  Nepro 4 cans per day was started.    On 07/04/2018, Dr. Donnetta Hutching discussed at length with pt's wife by telephone, if he has another bleed, I feel he will have exhausted all possibility of salvage flow into the profunda and would require ligation of his distal external iliac artery for control of bleeding and infection.  Explained the patient's wife that he would clearly would not  have adequate flow for salvage of his limb and would require above-knee amputation with risk of healing even that.  Also explained that he certainly has had a life threatening situation if he continues to have a groin infection.  She expresses understanding and appreciates the care he is receiving  She also is requesting an order for a lift chair to help when he is transferred to home.  I feel that this is certainly appropriate.  On 07/14/2018, patient had a change in character of drainage around 2:30 AM.  Had right red blood in his Jackson-Pratt and his VAC drain was alarming clot as well.  Remains hemodynamically stable.  Obviously has had rebleeding from his femoral artery.  Discussed this again with patient as I had before.  Recurrent bleeding from his femoral artery site.  Had initial left femoral endarterectomy and Dacron patch on 05/04/2018.  Had this all removed and the Dacron patch replaced with his saphenous vein on 06/14/2018.  Had disruption of this with bleeding and replacement of the saphenous vein patch with bovine pericardial patch on 06/14/2018.  Then had subsequent bleed and repair of this patch on 06/27/2018.  I discussed with the patient's wife by telephone yesterday and with the patient that if he had recurrent bleeding is most likely treatment would be ligation of his femoral artery and hopeful control of the infection and bleeding.  Also explained the near certainty of requirement of a above-knee amputation with marginal healing possibility if this is required.  I again discussed this with patient this morning and he will be taken immediately to surgery for exploration possible repair and possible ligation and oversew of the external iliac artery.  Dr. Donnetta Hutching discussed with pt's wife by telephone that we removed his bovine pericardial patch and oversewed the artery proximally and distally.  Explained the extreme high expectation that he will require above-knee amputation with marginal flow for  healing of this.  Also explained expected need for rehabilitation assuming he is able to heal amputation if required.  She is obviously very upset especially due to the fact that she is unable to visit him due to the visitation restrictions.  On 06/28/2018, pt was taken to the operating room and underwent left above knee amputation.  He was transported to the recovery room in satisfactory condition.    On 07/08/2018, his left AKA incision was unremarkable and daily dressing changes ordered.    On 07/09/2018, Dr. Carlis Abbott was called by rapid response that the pt is on a non rebreather with oxygen saturations in the 87-88%.  He had some increased respiratory distress overnight.  Patient is due for dialysis today but does not appear terribly volume overloaded on exam.  He does have worsening consolidation on his chest x-ray with very poor pulmonary toilet and concern for  underlying risk of aspiration.  We will broaden his antibiotics to Vanc and cefepime.  He has been on Vanc Ancef for wound infection in his groin.  We will plan to move him to the ICU given likely increased need for respiratory care.  Will likely involve ICU team.  Gas shows FiO2 only 60.  Labs pending.  Dr. Oneida Alar tried to call wife that day on multiple occasion to update her on pt decline in condition.  He has worsening resp function.  Need to have discussions with wife of comfort care vs full support vent/feeding tube.  I do not believe he is going to recover unfortunately so comfort care may be best pathway forward if wife agrees.   Once the family was contacted, the continued to want a full code at this point.  It was explained he may not survive to get out of hospital or come off ventilator but they want to press ahead for now.    Pt was intubated.  He was requiring pressor support but this was weaned.  His bronchial cx revealed >100k GNR and he was on vanc and maxipime.  His left stump looks good.  Will order stump sock.  The vac was changed  per [place] on 4/13 and noted to have distinct foul odor and wound bed pale grey/pink with deep fissures.  No beefy granulation tissue in the wound.    On 4/15, he was back on Levophed and still intubated.   Wound in left groin does not appear to be healing and wound vac will be left off.  I have discussed with patient's wife grim prognosis and she seems to have a decent understanding but does request that she could possibly see him first.  I have consulted palliative care whom I think will be very helpful in this difficult situation.  Wet-to-dry's to his groin for now and continue antibiotics.  On 07/20/18, the wound was examined yesterday and was felt that there was really no chance of this healing.  The VAC was removed.  I inspected the wound again this morning and I would agree that there is no chance of this healing.  Palliative care has been consulted.  I will try to speak to the wife later today.  I think his prognosis is very poor and would support comfort measures given really no options in the left groin for healing.  After discussion with palliative care, wife very emotional about poor prognosis but also expresses understanding and states many times she does not want patient to suffer. We discussed moving forward with plan for liberation from ventilator and focusing on comfort. Wife agrees this is appropriate. Multiple calls between wife, other family members, and bedside staff to arrange plan for this. After much planning, plan worked out for wife and 3 children to come to the hospital today around 2 pm and move forward with comfort/withdrawal of care. We also discussed that patient remains a full code - recommending changing to DNR since we are moving toward a comfort focused plan of care. Wife agrees with this.   Pt was extubated at 1448 and passed at 1504.    Discharge Diagnosis:  Bleeding from wound [T14.8XXA]  Secondary Diagnosis: Patient Active Problem List   Diagnosis Date Noted    Terminal care    Acute respiratory distress    Acute respiratory failure ([place])    Goals of care, counseling/discussion    Palliative care by specialist    Pressure injury of skin 07/09/2018  Aspiration into airway    Bleeding 05/31/2018   Left groin pain 06/12/2018   Open wound of left thigh 06/11/2018   Chronic atrial fibrillation 06/11/2018   Cellulitis of left groin 06/11/2018   Thrombocytopenia ([place]) 06/11/2018   Wound infection 06/11/2018   PAD (peripheral artery disease) ([place]) 05/04/2018   AICD (automatic cardioverter/defibrillator) present    Typical atrial flutter ([place]) 10/16/2017   Acute respiratory failure with hypoxia ([place]) 09/08/2017   C. difficile diarrhea 09/08/2017   Peritonitis associated with peritoneal dialysis ([place]) 08/16/2017   At high risk for falls 07/27/2017   Arteriosclerotic vascular disease 03/16/2017   Chronic heart disease 03/16/2017   ESRD on dialysis ([place]) 03/16/2017   Hypercholesterolemia 03/16/2017   Hypertensive disorder 03/16/2017   Anemia of unknown etiology 07/07/2016   Malaise and fatigue 07/07/2016   Chronic pain syndrome 06/20/2016   Secondary hyperparathyroidism ([place]) 12/16/2015   Stage 5 chronic kidney disease ([place]) 69/62/9528   Chronic systolic congestive heart failure ([place]) 11/25/2015   Tobacco use 11/25/2015   Ventral hernia without obstruction or gangrene 10/26/2015   Psychosexual dysfunction with inhibited sexual excitement 08/27/2015   Anxiety 08/26/2015   COPD (chronic obstructive pulmonary disease) ([place]) 08/26/2015   DDD (degenerative disc disease), lumbosacral 08/26/2015   GERD (gastroesophageal reflux disease) 08/26/2015   Hypothyroidism 08/26/2015   Microalbuminuria 08/26/2015   Mixed hyperlipidemia 08/26/2015   Osteoarthritis of multiple joints 08/26/2015   Peripheral vascular disease ([place]) 08/26/2015   Abdominal hernia 06/16/2015   CAD (coronary artery disease) 04/07/2015     ICD (implantable cardioverter-defibrillator) in place 04/07/2015   OSA (obstructive sleep apnea) 04/07/2015   Essential hypertension 03/06/2015   Retinopathy, diabetic, 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   Uncontrolled type 2 diabetes mellitus with diabetic polyneuropathy, with long-term current use of insulin ([place]) 03/06/2015   Atherosclerosis with claudication of extremity ([place]) 08/01/2014   Mild episode of recurrent major depressive disorder ([place]) 05/01/2013   Panic disorder 05/01/2013   Past Medical History:  Diagnosis Date   A-fib ([place])    AICD (automatic cardioverter/defibrillator) present    Anal fissure    Anxiety    Asthma    CAD (coronary artery disease)    CHF (congestive heart failure) (HCC)    COPD (chronic obstructive pulmonary disease) ([place])    Degeneration of lumbar or lumbosacral intervertebral disc    DM (diabetes mellitus), type 2, uncontrolled w/neurologic complication (H[facility])    Dyspnea    with exertion   Dysthymic disorder    End stage renal disease on dialysis [facility])    dialysis M-W-F   [employer] in [place] (primary) hypertension    GERD (gastroesophageal reflux disease)    History of hiatal hernia    Hx of colonic polyps    Hyperlipemia    Myocardial infarction ([place])    Peripheral vascular disease ([place])    Pneumonia    Recurrent major depression ([place])    Sleep apnea    Ventral hernia without obstruction or gangrene    Wears dentures     Disposition:  Deceased   Leontine Locket, PA-C [facility] [phone] 07/17/2018  9:49 AM

## 2018-07-27 NOTE — Progress Notes (Signed)
While changing pt's bed pad & turning him, lots of secretions came out of his mouth while on his side. They appeared to be tube feed color. Upon suctioning the pt's mouth with the yankauer, more tan colored secretions were noted. TF on hold. Pt resting comfortably in bed with HOB >45 degrees.

## 2018-07-27 NOTE — Progress Notes (Signed)
NAME:  Jose Sellers, MRN:  [id number], DOB:  08/20/1960, LOS: 59 ADMISSION DATE:  06/11/2018, CONSULTATION DATE:  07/09/2018 REFERRING MD:  Dr. Carlis Abbott -VSS CHIEF COMPLAINT:  Hypoxia   Brief History   Jose Sellers is a 58 y.o. male with ESRD on HD MWF who initially was admitted 3/16 through 3/24 for MRSA groin wound infection.  Discharged then returned 3/30 with bleeding from left groin. Required multiple trips to OR for exploration / repair and ultimately left AKA. 4/30 had hypoxia due to probable aspiration. Required transfer to ICU for intubation.  History of present illness   Jose Sellers is a 58 y.o. male who has a PMH as outlined below including but not limited to ESRD on HD MWF,  DM, HTN, CAD, A.fib, AICD, COPD (see "past medical history").  He was admitted 3/30 due to bleeding from left groin after graft removal and vein patch of left femoral artery about 10 days prior.  Prior to this admit, he had admission 3/16 through 3/24 for MRSA groin wound infection.  Course complicated by recurrent bleeding and ultimately ischemia requiring AKA.  See details below.  3/30 to OR for patch angioplasty of left CFA.   4/4 back to OR for continued bleeding.  Had CFA repair, left sartorius rotational muscle flap, 53F drain and negative pressure wound VAC placement. 4/9 back to OR for left groin exploration and oversewing of left profunda, superficial femoral and distal external iliac artery, placement of VAC dressing. 4/11 back to OR for left above knee amputation  Overnight 4/12, he had desaturations and increased O2 requirements.  Had similar issues prior which resolved after HD.  CXR showed possible worsening consolidation.  Initial thoughts were aspiration (per vascular, he had issues with this in the past and currently so weak that has poor cough / gag capabilities.    He was placed on NRB; however, continued to have sats in high 80s to low 90s.  Despite having open eyes, pt also  minimally responsive.  Not able to answer questions or follow commands.  Very weak cough.  PCCM consulted for concern he would require intubation.  I called pt's wife and discussed the case, overall condition and poor prognosis.  Dr. Oneida Alar with vascular also re-iterated same information including chance that pt may not survive this hospitalization and that if he were intubated, there is a risk that he may never be liberated from the ventilator.  Also discussed tube feeds and possibility of needed PEG / trach down the line.  Wife and son understandably emotional; however, not realistic about pt's condition and likelihood for meaningful recovery.  They are requesting that we transfer him to the ICU and proceed with intubation if needed.  Past Medical History  ESRD on HD MWF, OSA, aspiration, PVD, MI, HLD, CAD, CHF, A.fib, AICD, DM, COPD, anxiety.  Significant Hospital Events   3/16 through 3/24 > admitted for MRSA groin wound infection.   3/30 > OR for patch angioplasty of left CFA.   4/4 > OR for continued bleeding.  Had CFA repair, left sartorius rotational muscle flap, 53F drain and negative pressure wound VAC placement. 4/9 > OR for left groin exploration and oversewing of left profunda, superficial femoral and distal external iliac artery, placement of VAC dressing. 4/11 > OR for left above knee amputation. 4/13 > transferred to ICU and intubated.  Consults:  Nephrology PCCM  Procedures:  ETT 4/13>  Significant Diagnostic Tests:  CXR 4/13 > small right effusion,  new opacity left base. CXR 4/14 > bilateral infiltrates r>l, improvement in aeration of right lung  Micro Data:  Lft groin wound 3/19 > MRSA Left groin wound 3/13 > klebsiella (resistant to amp). BAL 4/13 > Kleb pneumo, moraxella catarrhalis  Antimicrobials:  Vanc 3/30 >  Ancef 3/30 / 4/13 Cefepime 4/13 > 4/14 Ceftriaxone 4/14>4/16   Interim history/subjective:  Per RN report, had improved mentation after HD  yesterday, however still confused and intermittently following commands.  Objective   Blood pressure (!) 123/34, pulse (!) 103, temperature 98.8 F (37.1 C), temperature source Oral, resp. rate (!) 33, height 6' (1.829 m), weight 67.6 kg, SpO2 93 %.    Vent Mode: PRVC FiO2 (%):  [50 %] 50 % Set Rate:  [16 bmp] 16 bmp Vt Set:  [620 mL] 620 mL PEEP:  [5 cmH20] 5 cmH20 Plateau Pressure:  [12 cmH20-20 cmH20] 20 cmH20   Intake/Output Summary (Last 24 hours) at 08-11-18 1126 Last data filed at 08/11/18 1100 Gross per 24 hour  Intake 1982.91 ml  Output -500 ml  Net 2482.91 ml   Filed Weights   07/11/18 0500 07/11/18 0730 08-11-18 0232  Weight: 66.6 kg 66.6 kg 67.6 kg    Examination: General: awake, intermittently follow commands, has purposeful movements to reach for ETT, older than stated age HENT: ETT in place, anicteric sclera Lungs: Scattered rhonchi Cardiovascular: RRR, no m/r/g Abdomen: soft, distended, decreased bowel sounds Extremities: s/p L AKA, no edema Neuro: awake, intermittently will follow commands to move toes and hands; makes spontaneous purposeful movement to reach for ETT   Resolved Hospital Problem list     Assessment & Plan:  Acute hypoxic respiratory failure  Aspiration pneuonia - K. Pneumoniae and M. catarrhalis per resp culture Pulmonary edema, sCHF (EF 25%) Hx COPD, ongoing tobacco abuse H/o PAD w/ left infected femoral popliteal bypass s/p L AKA (MRSA, Klebsiella) Non healing wound at site of L AKA Septic shock ESRD on HD Normocytic anemia Chronic A fib NSVT LIJ clot [place] Vomiting  Patient decompensating, with worsening of left AKA site wound, worsening shock, and unresolving encephalopathy. Per chart review and discussion with RN at bedside patient to be transitioned to comfort care with terminal extubation at 2pm today. He has been made DNR, with no escalation of care. Plan to continue current management until family arrives.  Plan:  Continue full vent support Continue levophed for BP support Will stop ceftriaxone as next due at 3pm today Made NPO  Heparin per VSS  Best practice:  Diet:NPO Pain/Anxiety/Delirium protocol (if indicated): PRNs for now, likely dilaudid drip later today when comfort  VAP protocol (if indicated): yes DVT prophylaxis: heparin GI prophylaxis: PPI Glucose control: SSI Mobility: BR Code Status: FULL Family Communication: 4/16 by RN and palliative care Disposition: plan to transition to comfort care later this afternoon  Labs   CBC: Recent Labs  Lab 07/02/2018 0231  07/09/18 0901  07/10/18 0045 07/10/18 0517 07/11/18 0256 Aug 11, 2018 0230 11-Aug-2018 0915  WBC 11.1*  --  9.6  --   --  11.5* 12.5* 14.5*  --   HGB 8.7*   < > 8.9*   < > 11.2* 8.4* 8.2* 8.0* 9.0*  HCT 27.7*   < > 27.7*   < > 33.0* 28.1* 27.1* 26.7* 30.6*  MCV 88.8  --  89.4  --   --  89.8 89.1 91.4  --   PLT 208  --  225  --   --  225 245 235  --    < > =  values in this interval not displayed.    Basic Metabolic Panel: Recent Labs  Lab 07/06/18 0427 07/09/2018 0710 07/09/18 0901 07/09/18 1425 07/10/18 0045 07/10/18 0517 07/11/18 0256 07/13/18 0230 13-Jul-2018 0911  NA 133* 132* 137 135 136 138 136 137  --   K 4.7 4.7 5.4* 5.3* 4.7 4.9 5.4* 4.1 4.1  CL 97*  --  99  --   --  101 98 100  --   CO2 24  --  24  --   --  _0 --   GLUCOSE 100* 135* 82  --   --  97 143* 165*  --   BUN 45*  --  70*  --   --  50* 69* 37*  --   CREATININE 5.39*  --  7.24*  --   --  5.41* 6.49* 4.03*  --   CALCIUM 8.7*  --  8.3*  --   --  8.1* 8.0* 7.7*  --   MG  --   --   --   --   --  1.9  --   --  2.5*  PHOS  --   --   --   --   --  5.2* 3.6 2.4*  --    GFR: Estimated Creatinine Clearance: 19.6 mL/min (A) (by C-G formula based on SCr of 4.03 mg/dL (H)). Recent Labs  Lab 07/06/18 1156 07/06/18 1410  07/09/18 0901 07/09/18 0908 07/09/18 1244 07/10/18 0517 07/11/18 0256 07-13-18 0230  WBC  --   --    < > 9.6  --   --  11.5*  12.5* 14.5*  LATICACIDVEN 1.4 1.2  --   --  1.2 1.3  --   --   --    < > = values in this interval not displayed.    Liver Function Tests: Recent Labs  Lab 07/10/18 0517 07/11/18 0256 07/13/2018 0230  ALBUMIN 1.4* 1.3* 1.3*   No results for input(s): LIPASE, AMYLASE in the last 168 hours. No results for input(s): AMMONIA in the last 168 hours.  ABG    Component Value Date/Time   PHART 7.399 07/10/2018 0045   PCO2ART 43.8 07/10/2018 0045   PO2ART 104.0 07/10/2018 0045   HCO3 27.1 07/10/2018 0045   TCO2 28 07/10/2018 0045   O2SAT 98.0 07/10/2018 0045     Coagulation Profile: No results for input(s): INR, PROTIME in the last 168 hours.  Cardiac Enzymes: No results for input(s): CKTOTAL, CKMB, CKMBINDEX, TROPONINI in the last 168 hours.  HbA1C: Hgb A1c MFr Bld  Date/Time Value Ref Range Status  05/01/2018 11:47 AM 6.0 (H) 4.8 - 5.6 % Final    Comment:    (NOTE) Pre diabetes:          5.7%-6.4% Diabetes:              >6.4% Glycemic control for   <7.0% adults with diabetes     CBG: Recent Labs  Lab 07/11/18 2003 07/11/18 2359 07/13/2018 0401 07-13-18 0739 13-Jul-2018 1114  GLUCAP 161* 119* 166* 167* 144*    Past Medical History  He,  has a past medical history of A-fib ([place]), AICD (automatic cardioverter/defibrillator) present, Anal fissure, Anxiety, Asthma, CAD (coronary artery disease), CHF (congestive heart failure) ([place]), COPD (chronic obstructive pulmonary disease) ([place]), Degeneration of lumbar or lumbosacral intervertebral disc, DM (diabetes mellitus), type 2, uncontrolled w/neurologic complication ([place]), Dyspnea, Dysthymic disorder, End stage renal disease on dialysis ([place]), Essential (primary) hypertension, GERD (gastroesophageal reflux disease),  History of hiatal hernia, colonic polyps, Hyperlipemia, Myocardial infarction [facility]), Peripheral vascular disease ([place]), Pneumonia, Recurrent major depression ([place]), Sleep apnea, Ventral hernia without obstruction or  gangrene, and Wears dentures.   Surgical History    Past Surgical History:  Procedure Laterality Date  . ABDOMINAL AORTOGRAM W/LOWER EXTREMITY N/A 04/20/2018   Procedure: ABDOMINAL AORTOGRAM W/LOWER EXTREMITY;  Surgeon: Elam Dutch, MD;  Location: [place] CV LAB;  Service: Cardiovascular;  Laterality: N/A;  . AMPUTATION Left 07/11/2018   Procedure: LEFT ABOVE KNEE AMPUTATION;  Surgeon: Elam Dutch, MD;  Location: [facility] OR;  Service: Vascular;  Laterality: Left;  . AV FISTULA PLACEMENT Right 02/13/2018   Procedure: CREATION OF RADIOCEPHALIC ARTERIOVENOUS FISTULA RIGHT ARM;  Surgeon: Elam Dutch, MD;  Location: [place];  Service: Vascular;  Laterality: Right;  . CAD s/p cabbage    . CARDIAC DEFIBRILLATOR PLACEMENT  2012  . CATARACT EXTRACTION W/ INTRAOCULAR LENS IMPLANT    . COLONOSCOPY  12/27/2013   Colonic polyps status post polypectomy.  Mild sigmoid diverticulosis. Internal hemorrhoids. Tubular adenoma.   . CORONARY ANGIOPLASTY    . CORONARY STENT PLACEMENT    . ENDARTERECTOMY Left 05/04/2018   Procedure: LEFT FEMORAL ENDARTERECTOMY;  Surgeon: [name], MD;  Location: [facility] OR;  Service: Vascular;  Laterality: Left;  . ESOPHAGOGASTRODUODENOSCOPY  12/01/2016   Mild gastritis.   . FEMORAL ARTERY EXPLORATION Left 06/06/2018   Procedure: LEFT GROIN EXPLORATION;  Surgeon: Elam Dutch, MD;  Location: [place];  Service: Vascular;  Laterality: Left;  . FEMORAL ARTERY EXPLORATION Left 07/17/2018   Procedure: LEFT GROIN EXPLORATION; LEFT FEMORAL ARTERY EXPLORATION; LIGATION OF LEFT EXTERNAL ILIAC ARTERY; LIGATION OF LEFT FEMORAL ARTERY; APPLICATION OF WOUND VAC LEFT GROIN;  Surgeon: [name], MD;  Location: [place];  Service: Vascular;  Laterality: Left;  . FEMORAL-POPLITEAL BYPASS GRAFT Left 05/04/2018   Procedure: LEFT FEMORAL TO ABOVE KNEE POPLITEAL BYPASS GRAFT USING 6MM X 80CM GORE PROPATEN RINGED GRAFT;  Surgeon: [name], MD;  Location: [place];  Service: Vascular;   Laterality: Left;  . FEMORAL-POPLITEAL BYPASS GRAFT Left 06/14/2018   Procedure: Removal of Infected Left Femoral popliteal Bypass Graft  with vein patch angioplasty of common femoral and Popiteal artery;  Surgeon: [name], MD;  Location: [facility] OR;  Service: Vascular;  Laterality: Left;  . heart bypass    . HERNIA REPAIR    . I&D EXTREMITY Left 06/14/2018   Procedure: IRRIGATION AND DEBRIDEMENT LEFT GROIN WOUND AND LEFT POPLITEAL WOUND.;  Surgeon: [name], MD;  Location: [facility] OR;  Service: Vascular;  Laterality: Left;  . IR REMOVAL TUN CV CATH W/O FL  06/29/2018  . KNEE SURGERY    . PATCH ANGIOPLASTY Left 05/04/2018   Procedure: Left Common Femoral/Profunda Patch Angioplasty using Hemashield Platinum Finesse Patch;  Surgeon: [name], MD;  Location: [place];  Service: Vascular;  Laterality: Left;  . PATCH ANGIOPLASTY Left 06/06/2018   Procedure: PATCH ANGIOPLAST OF LEFT FEMORAL ARTERY;  Surgeon: Elam Dutch, MD;  Location: [place];  Service: Vascular;  Laterality: Left;  . WOUND EXPLORATION Left 07/08/2018   Procedure: REPAIR OF LEFT COMMON FEMORAL ARTERY; LEFT SARTORIOUS ROTATIONAL MUSCLE FLAP; APPLICATION OF NEGATIVE PRESSURE WOUND VAC LEFT GROIN;  Surgeon: Marty Heck, MD;  Location: [place];  Service: Vascular;  Laterality: Left;     Social History   reports that he has been smoking cigarettes. He has a 8.75 pack-year smoking history. He has never  used smokeless tobacco. He reports previous alcohol use. He reports that he does not use drugs.   Family History   His family history is negative for Colon cancer.   Allergies Allergies  Allergen Reactions  . Albuterol Palpitations    SVT  . Prednisone Shortness Of Breath    Wife stated that patient develops breathing issues.  . Fluticasone Furoate-Vilanterol Palpitations  . Morphine Rash     Home Medications  Prior to Admission medications   Medication Sig Start Date End Date Taking? Authorizing Provider  amiodarone  (PACERONE) 200 MG tablet Take 200 mg by mouth 2 (two) times daily.     [provider]  aspirin 81 MG tablet Take 81 mg by mouth daily.  01/20/11   [provider]  atorvastatin (LIPITOR) 40 MG tablet Take 40 mg by mouth 2 (two) times daily.     [provider]  B Complex-C-Folic Acid (DIALYVITE 010) 0.8 MG TABS Take 1 tablet by mouth daily.  12/19/17   [provider]  calcitRIOL (ROCALTROL) 0.25 MCG capsule Take 0.25 mcg by mouth every Monday, Wednesday, and Friday with hemodialysis.     [provider]  esomeprazole (NEXIUM) 40 MG capsule Take 40 mg by mouth daily before breakfast.     [provider]  ferric citrate (AURYXIA) 1 GM 210 MG(Fe) tablet Take 420 mg by mouth 3 (three) times daily with meals.     [provider]  FLUoxetine (PROZAC) 20 MG capsule Take 20 mg by mouth daily.  11/07/17   [provider]  furosemide (LASIX) 40 MG tablet Take 40 mg by mouth See admin instructions. Non dialysis days. Do not take on Monday, Wednesday and Friday    [provider]  gabapentin (NEURONTIN) 300 MG capsule Take 1 capsule (300 mg total) by mouth 3 (three) times daily. 06/22/18   Landis Martins, DPM  Insulin Glargine (LANTUS SOLOSTAR) 100 UNIT/ML Solostar Pen Inject 8-12 Units into the skin at bedtime as needed (blood sugar).     [provider]  insulin lispro (HUMALOG KWIKPEN) 100 UNIT/ML KwikPen Inject 0-32 Units into the skin 3 (three) times daily. Sliding Scale Insulin     [provider]  levalbuterol (XOPENEX) 0.63 MG/3ML nebulizer solution Inhale 0.63 mg into the lungs 2 (two) times daily.  10/09/17   [provider]  lidocaine-prilocaine (EMLA) cream Apply 1 application topically See admin instructions. Apply small amount to access site 1-2 hours prior to dialysis. 01/29/18   [provider]  metoprolol tartrate (LOPRESSOR) 25 MG tablet Take 12.5 mg by mouth 2 (two) times daily.   03/10/18   [provider]  nitroGLYCERIN (NITROSTAT) 0.4 MG SL tablet Place 0.4 mg under the tongue every 5 (five) minutes as needed for chest pain.    [provider]  oxycodone (ROXICODONE) 30 MG immediate release tablet Take 30 mg by mouth every 8 (eight) hours as needed for pain.     [provider]  oxyCODONE-acetaminophen (PERCOCET) 7.5-325 MG tablet Take 1 tablet by mouth every 6 (six) hours as needed for severe pain. 05/06/18   Dagoberto Ligas, PA-C  SANTYL ointment Apply 1 application topically daily. 05/29/18   [name], MD  tiotropium (SPIRIVA) 18 MCG inhalation capsule Place 18 mcg into inhaler and inhale daily.  03/10/18   [provider]   [name], MD IMTS - PGY3 Pager [phone]

## 2018-07-27 NOTE — Progress Notes (Signed)
[place] for heparin Indication: DVT  Allergies  Allergen Reactions  . Albuterol Palpitations    SVT  . Prednisone Shortness Of Breath    Wife stated that patient develops breathing issues.  . Fluticasone Furoate-Vilanterol Palpitations  . Morphine Rash    Patient Measurements: Height: 6' (182.9 cm) Weight: 149 lb 0.5 oz (67.6 kg) IBW/kg (Calculated) : 77.6 Heparin Dosing Weight: 67kg  Vital Signs: Temp: 98.8 F (37.1 C) (04/16 1108) Temp Source: Oral (04/16 1108) BP: 123/50 (04/16 1200) Pulse Rate: 98 (04/16 1200)  Labs: Recent Labs    07/10/18 0517 07/11/18 0256 02-Aug-2018 0230 Aug 02, 2018 0915 08/02/18 1200  HGB 8.4* 8.2* 8.0* 9.0*  --   HCT 28.1* 27.1* 26.7* 30.6*  --   PLT 225 245 235  --   --   HEPARINUNFRC  --   --  0.24*  --  0.32  CREATININE 5.41* 6.49* 4.03*  --   --     Estimated Creatinine Clearance: 19.6 mL/min (A) (by C-G formula based on SCr of 4.03 mg/dL (H)). Assessment: 58 year old male now with clot in left IJ, new orders to start IV heparin.   Heparin level came back therapeutic at 0.32, on 1000 units/hr. No bleed or infusion issues.   Goal of Therapy:  Heparin level 0.3-0.7 units/ml Monitor platelets by anticoagulation protocol: Yes   Plan:  Continue heparin to 1000 units/hr Plan for possible GOC discussion with family   Thanks for allowing pharmacy to be a part of this patient's care.  Antonietta Jewel, PharmD, [place] Clinical Pharmacist  Pager: [phone] Phone: [phone]

## 2018-07-27 NOTE — Progress Notes (Signed)
TOD called to CDS. May be released to funeral home. Bedside RN made aware.

## 2018-07-27 NOTE — Progress Notes (Signed)
Had discussion with palliative team, CCM and Vascular.  Wife agreed to make pt a DNR and to proceed with comfort care measures.. Pt's wife and children's names given to security to allow visitation upon arrival.  Plan to continue current plan of care until family arrives.  Will continue to monitor patient.

## 2018-07-27 NOTE — Progress Notes (Addendum)
Pt extubated at 1448 and passed at 57. 2 RNs listened for 2 mintues to confirm.  Family present at bedside. Patient belongings given to [name] (wife).

## 2018-07-27 NOTE — Progress Notes (Signed)
[place] for heparin Indication: DVT  Allergies  Allergen Reactions  . Albuterol Palpitations    SVT  . Prednisone Shortness Of Breath    Wife stated that patient develops breathing issues.  . Fluticasone Furoate-Vilanterol Palpitations  . Morphine Rash    Patient Measurements: Height: 6' (182.9 cm) Weight: 149 lb 0.5 oz (67.6 kg) IBW/kg (Calculated) : 77.6 Heparin Dosing Weight: 67kg  Vital Signs: Temp: 101.7 F (38.7 C) (04/16 0100) Temp Source: Oral (04/16 0100) BP: 110/56 (04/16 0300) Pulse Rate: 97 (04/16 0300)  Labs: Recent Labs    07/10/18 0517 07/11/18 0256 07-14-18 0230  HGB 8.4* 8.2* 8.0*  HCT 28.1* 27.1* 26.7*  PLT 225 245 235  HEPARINUNFRC  --   --  0.24*  CREATININE 5.41* 6.49* 4.03*    Estimated Creatinine Clearance: 19.6 mL/min (A) (by C-G formula based on SCr of 4.03 mg/dL (H)). Assessment: 58 year old male now with clot in left IJ, new orders to start IV heparin.   Initial heparin level 0.24 units/ml.   Goal of Therapy:  Heparin level 0.3-0.7 units/ml Monitor platelets by anticoagulation protocol: Yes   Plan:  Increase heparin to 1000 units/hr Check heparin level 8 hours after rate change  Thanks for allowing pharmacy to be a part of this patient's care.  Excell Seltzer, PharmD Clinical Pharmacist

## 2018-07-27 DEATH — deceased

## 2019-07-18 IMAGING — CR PORTABLE CHEST - 1 VIEW
1 series · 1 of 1 positions shown · non-contrast
Comparison: 05/23/2018

CLINICAL DATA: Respiratory distress

EXAM:
PORTABLE CHEST 1 VIEW

[AP]
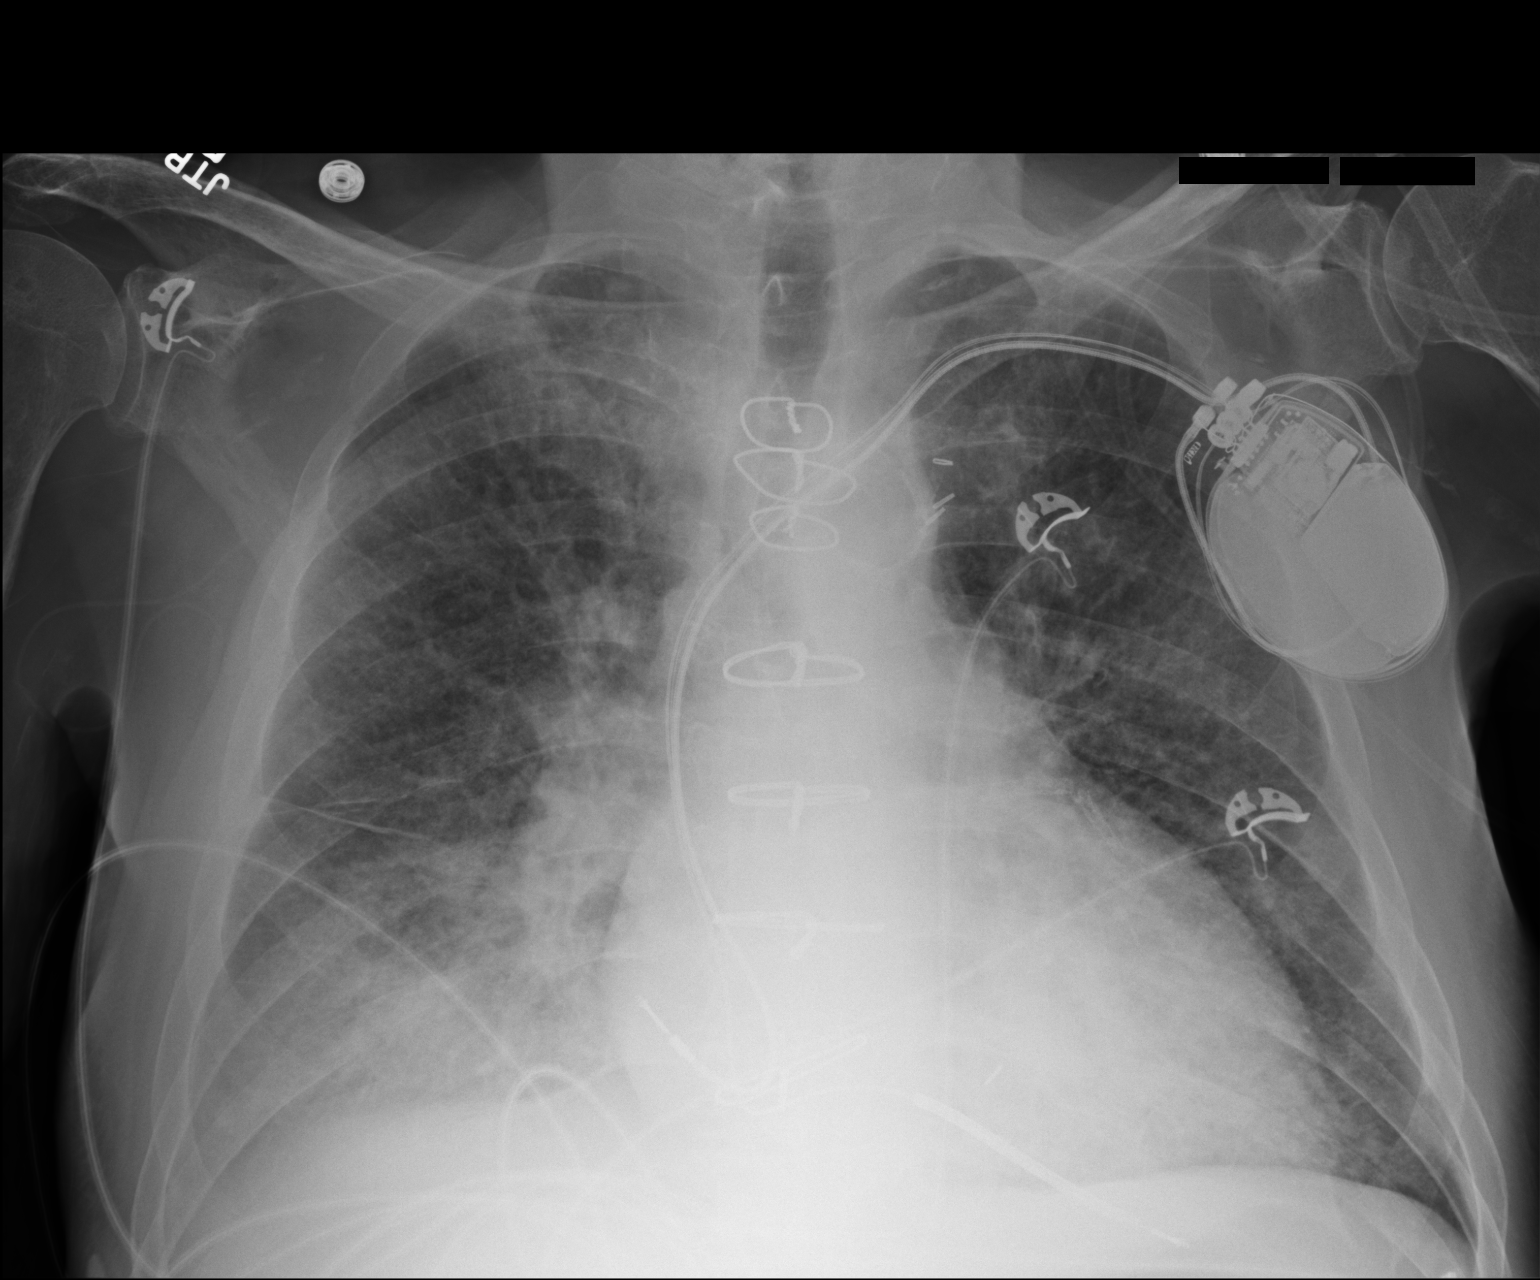

[1 of 1 positions shown; findings below may reference images not displayed]

FINDINGS: Cardiac enlargement. Congestive heart failure with bilateral edema
right greater than left. Small right effusion. No left effusion

Postop CABG. AICD unchanged in position. Right jugular catheter
removed since the prior study.
IMPRESSION: Significant progression of congestive heart failure and pulmonary
edema. Small right pleural effusion.

## 2019-07-21 IMAGING — DX PORTABLE CHEST - 1 VIEW
1 series · 1 of 1 positions shown · non-contrast
Comparison: Film earlier today at 5885 hours

CLINICAL DATA: Respiratory failure and status post intubation.

EXAM:
PORTABLE CHEST 1 VIEW

[chest]
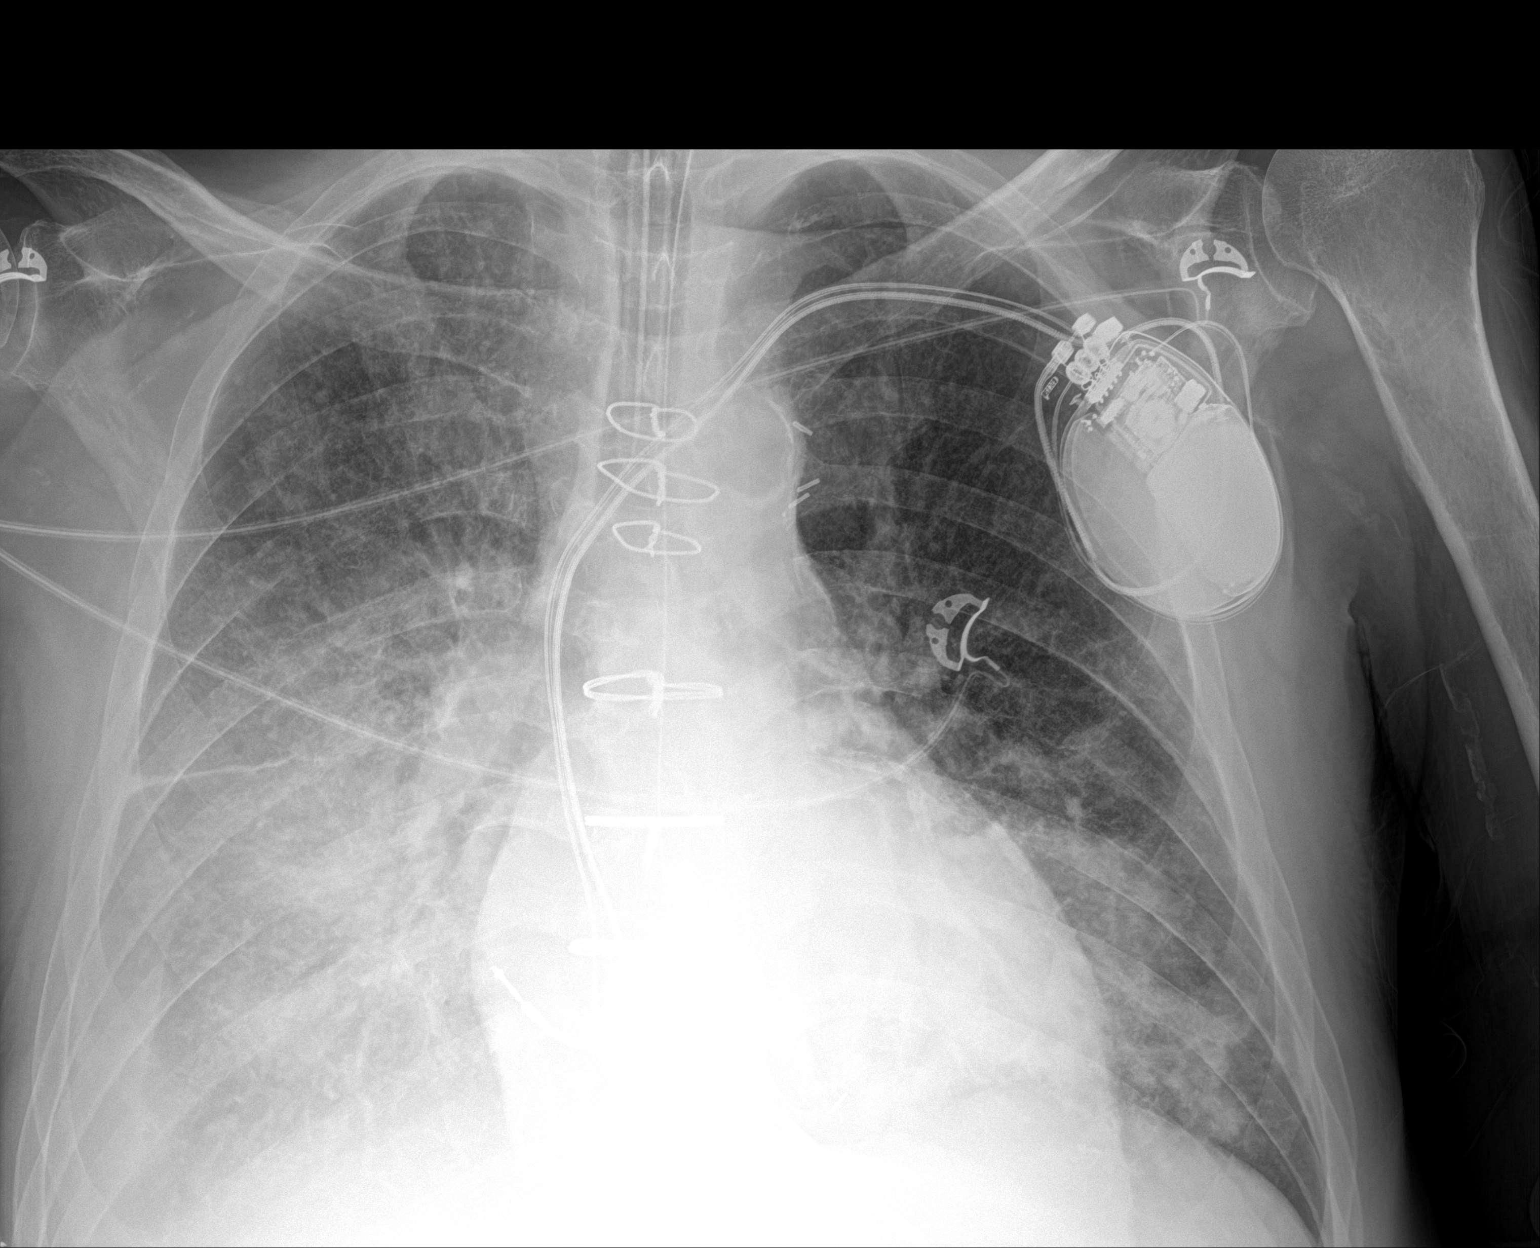

[1 of 1 positions shown; findings below may reference images not displayed]

FINDINGS: Endotracheal tube now present with the tip approximately 2.5 cm
above the carina. Gastric decompression tube extends below the
diaphragm. Stable heart size and appearance pacing/ICD device. Lungs
show worsening airspace disease the right lung and left lower lung.
There is an associated right pleural effusion. No pneumothorax.
IMPRESSION: 1. Endotracheal tube tip lies approximately 2.5 cm above the carina.
2. Worsening airspace disease of the right lung and left lower lung.
Associated right pleural effusion.

## 2019-07-21 IMAGING — DX PORTABLE CHEST - 1 VIEW
1 series · 1 of 1 positions shown · non-contrast
Comparison: July 07, 2018

CLINICAL DATA: Respiratory distress.  Aspiration and airway.

EXAM:
PORTABLE CHEST 1 VIEW

[chest]
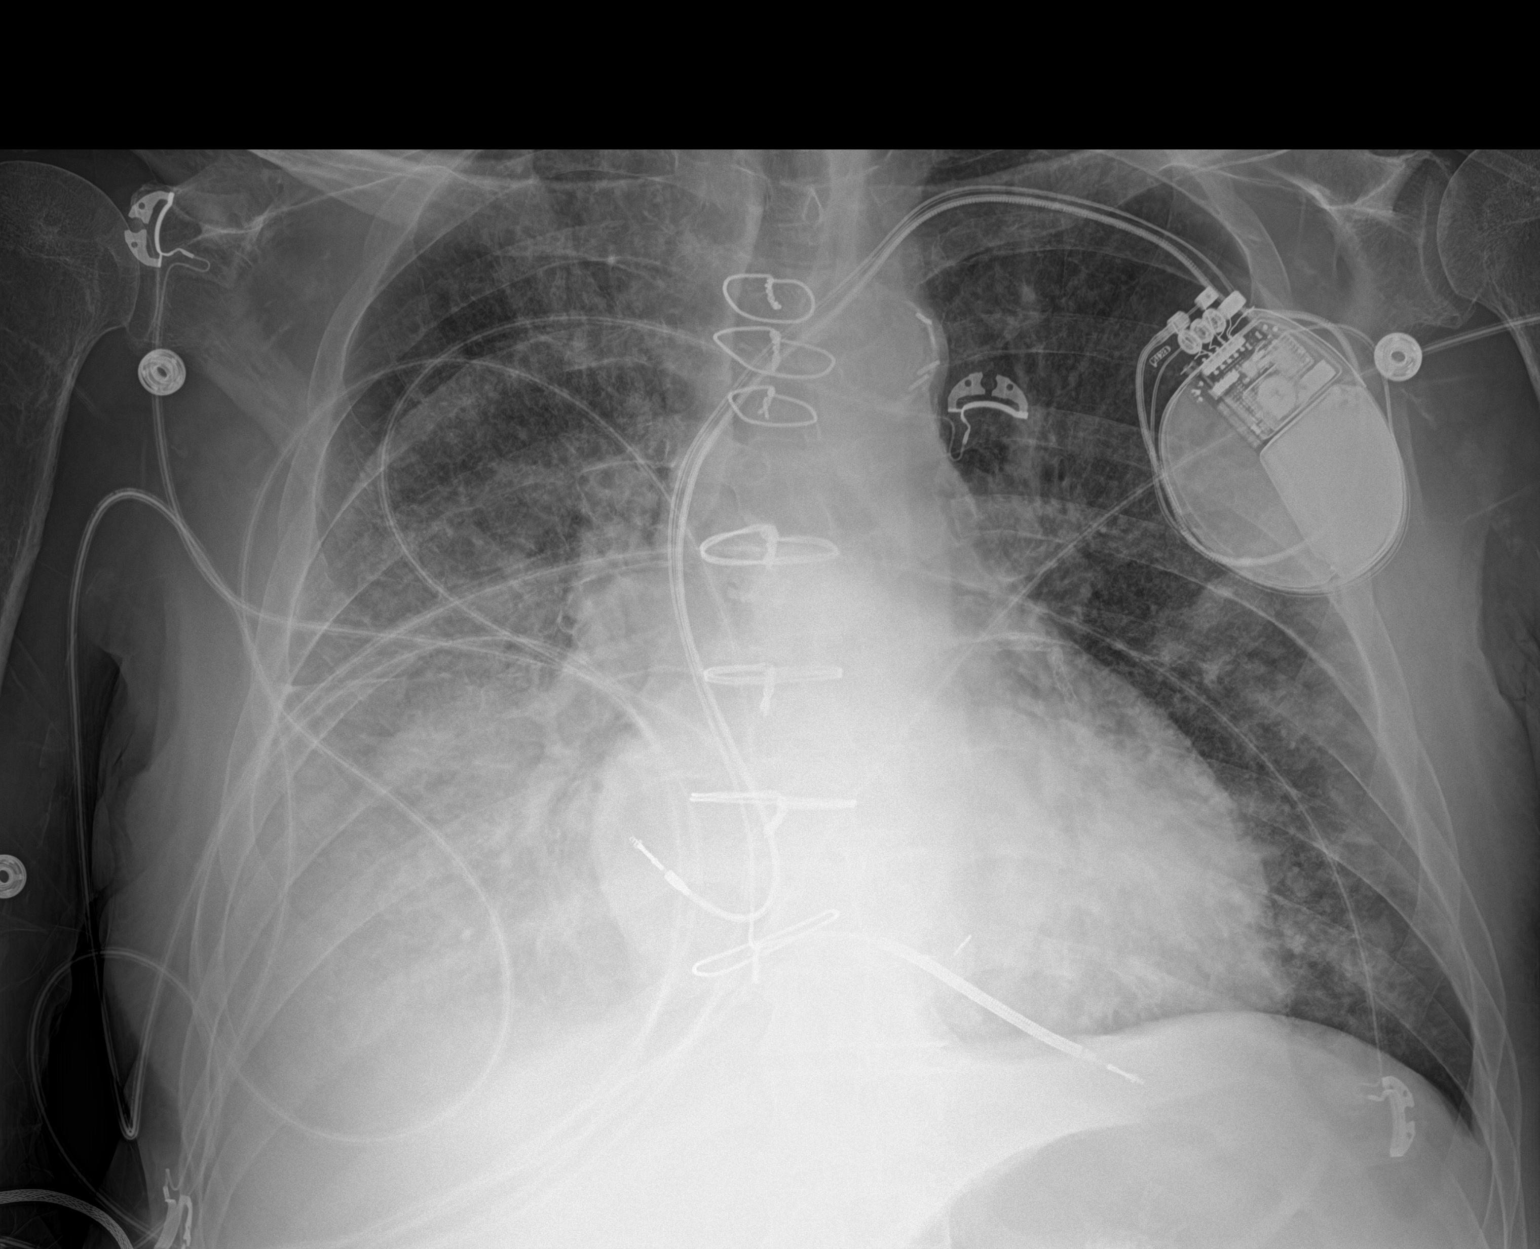

[1 of 1 positions shown; findings below may reference images not displayed]

FINDINGS: No pneumothorax. Stable mild cardiomegaly. Stable AICD device.
Opacity remains in the right base, possibly with an associated
effusion. Mild increased patchy opacity in the left base.
IMPRESSION: 1. Stable opacity and possible associated effusion in the right
base.
2. New patchy opacity in left base concerning for an infectious
process. Recommend follow-up to resolution.

## 2019-07-22 IMAGING — DX PORTABLE CHEST - 1 VIEW
1 series · 1 of 1 positions shown · non-contrast
Comparison: 07/09/2018

CLINICAL DATA: Respiratory failure.

EXAM:
PORTABLE CHEST 1 VIEW

[chest ap]
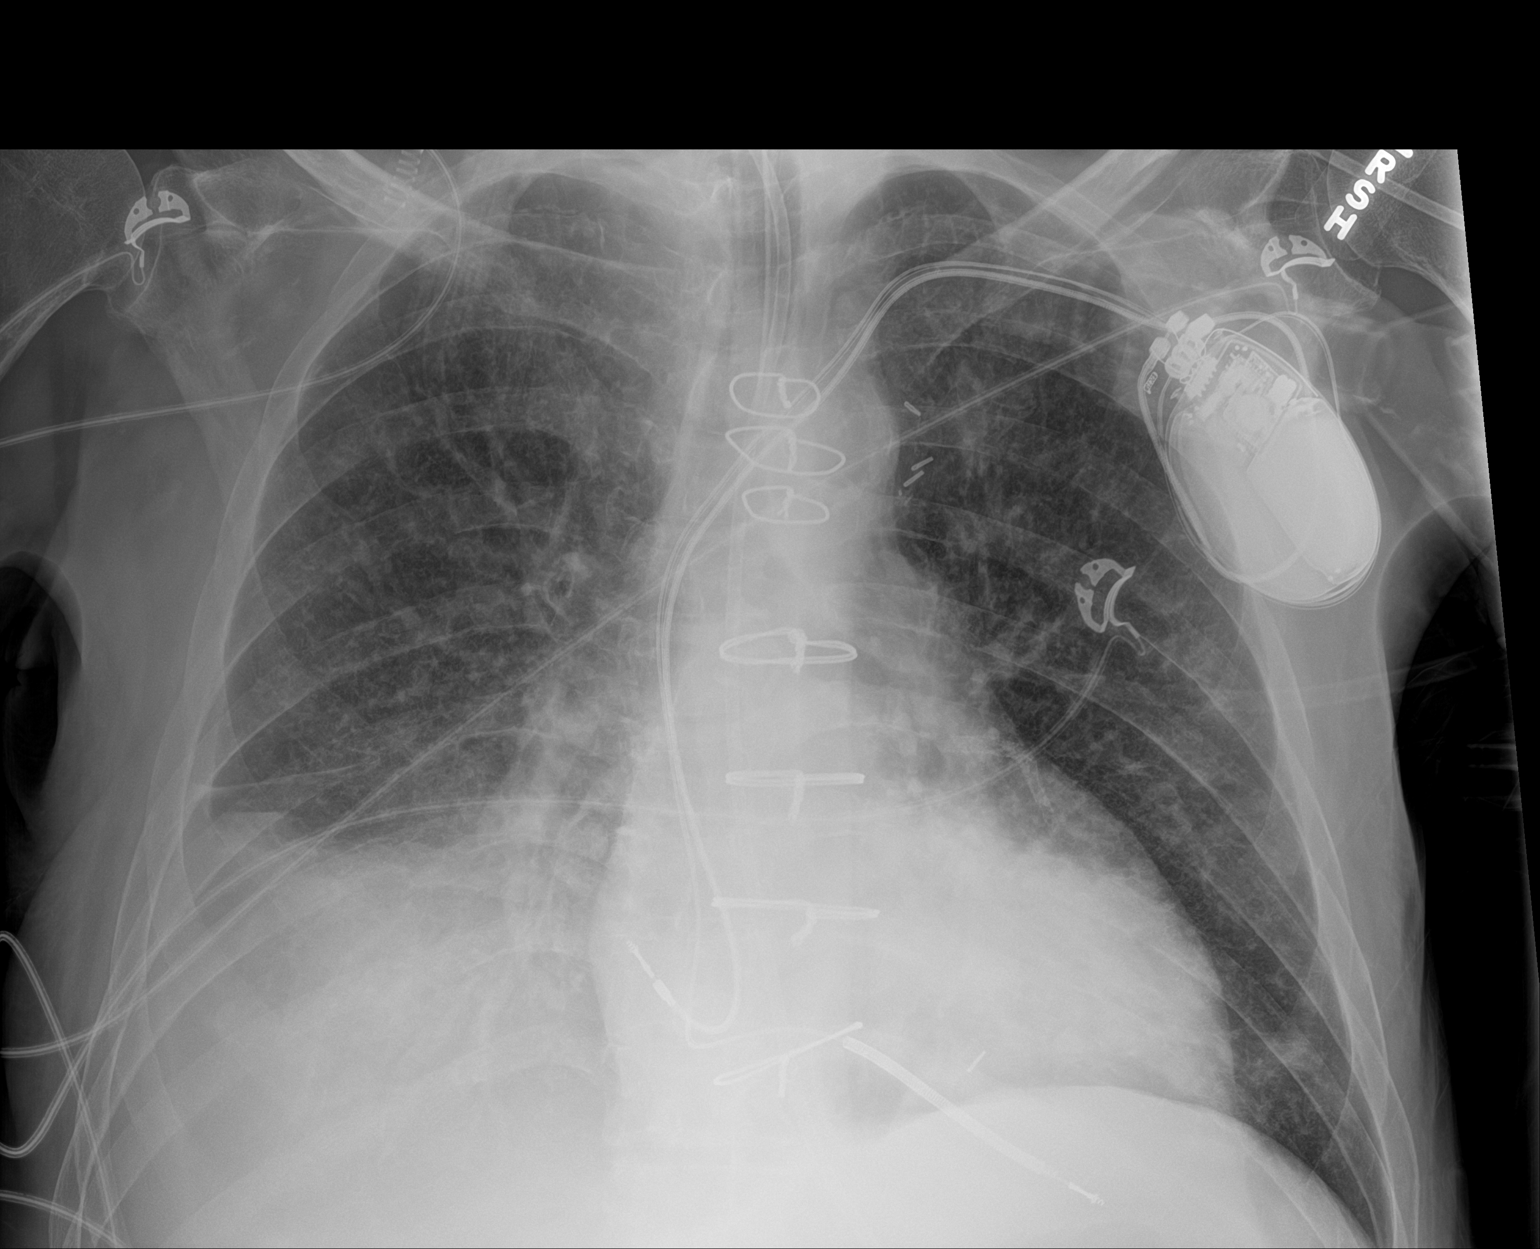

[1 of 1 positions shown; findings below may reference images not displayed]

FINDINGS: Endotracheal tube is 4.3 cm above the carina. Feeding tube extends
in the abdomen and the tip is beyond the image. More confluent
airspace densities in the retrocardiac space. Additional patchy
densities at the left lung base. Persistent densities at the right
lung base likely associated with a combination airspace disease and
pleural fluid. There appears to be slightly improved aeration in the
right lung compared to the recent comparison examination. Stable
appearance of the cardiac ICD. Heart size is within normal limits
and stable. Patient has median sternotomy wires.
IMPRESSION: 1. Bibasilar lung densities are suggestive for airspace disease.
Progression or more confluent densities in the retrocardiac space.
2. Right pleural effusion. There may be improved aeration in the
right lung.
3. Endotracheal tube is appropriately positioned.
